# Patient Record
Sex: Male | Born: 1940 | Race: White | Hispanic: No | Marital: Married | Smoking: Former smoker
Health system: Southern US, Community
[De-identification: ages and names within clinical notes are randomized; demographics above are authoritative.]

## PROBLEM LIST (undated history)

## (undated) DIAGNOSIS — M1990 Unspecified osteoarthritis, unspecified site: Secondary | ICD-10-CM

## (undated) DIAGNOSIS — K219 Gastro-esophageal reflux disease without esophagitis: Secondary | ICD-10-CM

## (undated) DIAGNOSIS — I10 Essential (primary) hypertension: Secondary | ICD-10-CM

## (undated) DIAGNOSIS — J449 Chronic obstructive pulmonary disease, unspecified: Secondary | ICD-10-CM

## (undated) DIAGNOSIS — E119 Type 2 diabetes mellitus without complications: Secondary | ICD-10-CM

## (undated) DIAGNOSIS — C801 Malignant (primary) neoplasm, unspecified: Secondary | ICD-10-CM

## (undated) DIAGNOSIS — T884XXA Failed or difficult intubation, initial encounter: Secondary | ICD-10-CM

## (undated) DIAGNOSIS — I712 Thoracic aortic aneurysm, without rupture, unspecified: Secondary | ICD-10-CM

## (undated) DIAGNOSIS — J9819 Other pulmonary collapse: Secondary | ICD-10-CM

## (undated) DIAGNOSIS — N1832 Chronic kidney disease, stage 3b: Secondary | ICD-10-CM

## (undated) DIAGNOSIS — E785 Hyperlipidemia, unspecified: Secondary | ICD-10-CM

## (undated) DIAGNOSIS — K3580 Unspecified acute appendicitis: Secondary | ICD-10-CM

## (undated) DIAGNOSIS — I2510 Atherosclerotic heart disease of native coronary artery without angina pectoris: Secondary | ICD-10-CM

## (undated) DIAGNOSIS — E538 Deficiency of other specified B group vitamins: Secondary | ICD-10-CM

## (undated) DIAGNOSIS — M109 Gout, unspecified: Secondary | ICD-10-CM

## (undated) DIAGNOSIS — N289 Disorder of kidney and ureter, unspecified: Secondary | ICD-10-CM

## (undated) DIAGNOSIS — G4730 Sleep apnea, unspecified: Secondary | ICD-10-CM

## (undated) HISTORY — PX: CARDIAC CATHETERIZATION: SHX172

## (undated) HISTORY — PX: CHEST TUBE INSERTION: SHX231

## (undated) HISTORY — PX: JOINT REPLACEMENT: SHX530

## (undated) HISTORY — DX: Unspecified acute appendicitis: K35.80

## (undated) HISTORY — PX: EYE SURGERY: SHX253

## (undated) HISTORY — PX: APPENDECTOMY: SHX54

---

## 1963-08-21 HISTORY — PX: KNEE SURGERY: SHX244

## 2004-06-30 ENCOUNTER — Observation Stay: Payer: Self-pay | Admitting: Internal Medicine

## 2004-06-30 ENCOUNTER — Other Ambulatory Visit: Payer: Self-pay

## 2005-05-23 ENCOUNTER — Emergency Department: Payer: Self-pay | Admitting: Emergency Medicine

## 2005-05-24 ENCOUNTER — Other Ambulatory Visit: Payer: Self-pay

## 2008-05-20 ENCOUNTER — Ambulatory Visit: Payer: Self-pay | Admitting: Internal Medicine

## 2008-08-01 ENCOUNTER — Ambulatory Visit: Payer: Self-pay | Admitting: Family Medicine

## 2008-12-14 ENCOUNTER — Ambulatory Visit: Payer: Self-pay | Admitting: Urology

## 2009-10-25 ENCOUNTER — Ambulatory Visit: Payer: Self-pay | Admitting: Cardiovascular Disease

## 2009-10-25 ENCOUNTER — Ambulatory Visit: Payer: Self-pay | Admitting: Internal Medicine

## 2010-08-20 HISTORY — PX: CATARACT EXTRACTION: SUR2

## 2010-09-23 ENCOUNTER — Ambulatory Visit: Payer: Self-pay | Admitting: Internal Medicine

## 2014-05-27 ENCOUNTER — Emergency Department: Payer: Self-pay | Admitting: Emergency Medicine

## 2014-05-27 LAB — BASIC METABOLIC PANEL
ANION GAP: 7 (ref 7–16)
BUN: 11 mg/dL (ref 7–18)
CO2: 28 mmol/L (ref 21–32)
CREATININE: 1.02 mg/dL (ref 0.60–1.30)
Calcium, Total: 8.2 mg/dL — ABNORMAL LOW (ref 8.5–10.1)
Chloride: 105 mmol/L (ref 98–107)
EGFR (African American): >60
EGFR (Non-African Amer.): >60
Glucose: 120 mg/dL — ABNORMAL HIGH (ref 65–99)
Osmolality: 280 (ref 275–301)
Potassium: 3.8 mmol/L (ref 3.5–5.1)
Sodium: 140 mmol/L (ref 136–145)

## 2014-05-27 LAB — TROPONIN I: Troponin-I: <0.02 ng/mL

## 2014-05-27 LAB — CBC
HCT: 40.9 % (ref 40.0–52.0)
HGB: 13.3 g/dL (ref 13.0–18.0)
MCH: 30 pg (ref 26.0–34.0)
MCHC: 32.5 g/dL (ref 32.0–36.0)
MCV: 92 fL (ref 80–100)
Platelet: 198 10*3/uL (ref 150–440)
RBC: 4.42 10*6/uL (ref 4.40–5.90)
RDW: 12.9 % (ref 11.5–14.5)
WBC: 6.3 10*3/uL (ref 3.8–10.6)

## 2014-05-28 DIAGNOSIS — Z9889 Other specified postprocedural states: Secondary | ICD-10-CM | POA: Insufficient documentation

## 2014-05-28 DIAGNOSIS — R0602 Shortness of breath: Secondary | ICD-10-CM | POA: Insufficient documentation

## 2014-06-09 ENCOUNTER — Ambulatory Visit: Payer: Self-pay | Admitting: Internal Medicine

## 2014-07-07 ENCOUNTER — Ambulatory Visit: Payer: Self-pay | Admitting: Internal Medicine

## 2014-07-08 DIAGNOSIS — G4733 Obstructive sleep apnea (adult) (pediatric): Secondary | ICD-10-CM | POA: Insufficient documentation

## 2015-02-17 DIAGNOSIS — R21 Rash and other nonspecific skin eruption: Secondary | ICD-10-CM | POA: Insufficient documentation

## 2015-05-11 ENCOUNTER — Other Ambulatory Visit: Payer: Self-pay | Admitting: Internal Medicine

## 2015-05-11 DIAGNOSIS — IMO0002 Reserved for concepts with insufficient information to code with codable children: Secondary | ICD-10-CM

## 2015-05-11 DIAGNOSIS — N5089 Other specified disorders of the male genital organs: Secondary | ICD-10-CM

## 2015-05-12 ENCOUNTER — Ambulatory Visit
Admission: RE | Admit: 2015-05-12 | Discharge: 2015-05-12 | Disposition: A | Discharge status: Home / Self Care | Payer: Medicare Other | Source: Ambulatory Visit | Attending: Internal Medicine | Admitting: Internal Medicine

## 2015-05-12 ENCOUNTER — Ambulatory Visit: Admission: RE | Admit: 2015-05-12 | Payer: Medicare Other | Source: Ambulatory Visit

## 2015-05-12 DIAGNOSIS — N508 Other specified disorders of male genital organs: Secondary | ICD-10-CM | POA: Diagnosis present

## 2015-05-12 DIAGNOSIS — IMO0002 Reserved for concepts with insufficient information to code with codable children: Secondary | ICD-10-CM

## 2015-05-12 DIAGNOSIS — N503 Cyst of epididymis: Secondary | ICD-10-CM | POA: Insufficient documentation

## 2015-05-12 DIAGNOSIS — N5089 Other specified disorders of the male genital organs: Secondary | ICD-10-CM

## 2015-09-29 DIAGNOSIS — R739 Hyperglycemia, unspecified: Secondary | ICD-10-CM | POA: Insufficient documentation

## 2016-03-29 DIAGNOSIS — R972 Elevated prostate specific antigen [PSA]: Secondary | ICD-10-CM | POA: Insufficient documentation

## 2016-04-10 ENCOUNTER — Encounter: Payer: Self-pay | Admitting: Urology

## 2016-04-10 ENCOUNTER — Ambulatory Visit (INDEPENDENT_AMBULATORY_CARE_PROVIDER_SITE_OTHER): Payer: Medicare Other | Admitting: Urology

## 2016-04-10 VITALS — BP 202/109 | HR 62 | Ht 69.0 in | Wt 199.5 lb

## 2016-04-10 DIAGNOSIS — R972 Elevated prostate specific antigen [PSA]: Secondary | ICD-10-CM | POA: Diagnosis not present

## 2016-04-10 NOTE — Progress Notes (Signed)
04/10/2016 5:09 PM   [place] [birth year] [id number]  Referring provider: Tracie Harrier, MD [address] [facility], [place] [address]  Chief Complaint  Patient presents with  . Elevated PSA    HPI: 75 year old male was referred for an elevated PSA. The patient states that this was noted on a routine annual physical exam. The patient states that he has not had any significant progression of his voiding symptoms including frequency, urgency, or incontinence. He has not noted any blood in his urine. However, the patient has been treated approximately 7 or 8 months ago for epididymitis with tender to an sore testicles. The antibiotics seemed to help his pain and symptoms although this is lingered for a period of time. He denies any constipation.   The patient states that he has had PSA screening for many years. He had a elevated PSA 15 years ago which prompted a prostate biopsy. This was negative. He does not recall any specific numbers although he is been told that they've been normal. His never had an abnormal rectal exam. He has no family history of prostate cancer.  The patient has baseline erectile dysfunction.  PSA: 03/22/16-7.39     PMH: No past medical history on file.  Surgical History: Past Surgical History:  Procedure Laterality Date  . CATARACT EXTRACTION  2012  . KNEE SURGERY Left 1965    Home Medications:    Medication List       Accurate as of 04/10/16  5:09 PM. Always use your most recent med list.          allopurinol 100 MG tablet Commonly known as:  ZYLOPRIM Take 100 mg by mouth daily.   ALPRAZolam 0.5 MG tablet Commonly known as:  XANAX Take 0.5 mg by mouth at bedtime as needed for anxiety.   aspirin EC 81 MG tablet Take 81 mg by mouth daily.   cloNIDine 0.3 MG tablet Commonly known as:  CATAPRES Take 0.3 mg by mouth 3 (three) times daily.   hydrALAZINE 50 MG tablet Commonly known as:  APRESOLINE Take  50 mg by mouth 3 (three) times daily.   labetalol 300 MG tablet Commonly known as:  NORMODYNE Take 300 mg by mouth 2 (two) times daily.   meloxicam 15 MG tablet Commonly known as:  MOBIC Take 15 mg by mouth daily.   sildenafil 20 MG tablet Commonly known as:  REVATIO Take 20 mg by mouth 3 (three) times daily.   simvastatin 40 MG tablet Commonly known as:  ZOCOR Take 40 mg by mouth daily.       Allergies: Allergies not on file  Family History: Family History  Problem Relation Age of Onset  . Prostate cancer Neg Hx     Social History:  reports that he has quit smoking. He has never used smokeless tobacco. He reports that he drinks alcohol. He reports that he does not use drugs.  ROS: UROLOGY Frequent Urination?: No Hard to postpone urination?: No Burning/pain with urination?: No Get up at night to urinate?: No Leakage of urine?: No Urine stream starts and stops?: No Trouble starting stream?: Yes Do you have to strain to urinate?: Yes Blood in urine?: Yes Urinary tract infection?: No Sexually transmitted disease?: No Injury to kidneys or bladder?: No Painful intercourse?: No Weak stream?: No Erection problems?: No Penile pain?: No  Gastrointestinal Nausea?: No Vomiting?: No Indigestion/heartburn?: No Diarrhea?: No Constipation?: No  Constitutional Fever: No Night sweats?: No Weight loss?: No Fatigue?:  No  Skin Skin rash/lesions?: No Itching?: No  Eyes Blurred vision?: No Double vision?: No  Ears/Nose/Throat Sore throat?: No Sinus problems?: Yes  Hematologic/Lymphatic Swollen glands?: No Easy bruising?: No  Cardiovascular Leg swelling?: No Chest pain?: No  Respiratory Cough?: No Shortness of breath?: No  Endocrine Excessive thirst?: No  Musculoskeletal Back pain?: No Joint pain?: Yes  Neurological Headaches?: No Dizziness?: No  Psychologic Depression?: No Anxiety?: Yes  Physical Exam: BP (!) 202/109   Pulse 62   Ht  5\' 9"  (1.753 m)   Wt 199 lb 8 oz (90.5 kg)   BMI 29.46 kg/m   Constitutional:  Alert and oriented, No acute distress. HEENT: [place] AT, moist mucus membranes.  Trachea midline, no masses. Cardiovascular: No clubbing, cyanosis, or edema. Respiratory: Normal respiratory effort, no increased work of breathing. GI: Abdomen is soft, nontender, nondistended, no abdominal masses GU: No CVA tenderness.  DRE: 40 g prostate, smooth, symmetric, no appreciable nodules Skin: No rashes, bruises or suspicious lesions. Lymph: No cervical or inguinal adenopathy. Neurologic: Grossly intact, no focal deficits, moving all 4 extremities. Psychiatric: Normal mood and affect.  Laboratory Data: Lab Results  Component Value Date   WBC 6.3 05/27/2014   HGB 13.3 05/27/2014   HCT 40.9 05/27/2014   MCV 92 05/27/2014   PLT 198 05/27/2014    Lab Results  Component Value Date   CREATININE 1.02 05/27/2014    No results found for: PSA  No results found for: TESTOSTERONE  No results found for: HGBA1C  Urinalysis No results found for: COLORURINE, APPEARANCEUR, LABSPEC, PHURINE, GLUCOSEU, HGBUR, BILIRUBINUR, KETONESUR, PROTEINUR, UROBILINOGEN, NITRITE, LEUKOCYTESUR  Pertinent Imaging: None   Assessment & Plan:  The patient has an elevated PSA. I don't have any PSAs to compare to. I suspect however that this is an area and value and may reflect a similar process of inflammation to the patient's testicular pain several months prior.  1. Elevated PSA Our plan at this point is to check the PSA today and then again in 3 months. We will reevaluate at that time and discuss whether or not a prostate biopsy would be a reasonable approach.  - PSA Total (Reflex To Free)   Return in about 3 months (around 07/11/2016) for office visit with PSA prior.  Ardis Hughs, [facility] [address], [place], [place] [address] [phone]

## 2016-04-11 LAB — FPSA% REFLEX
% FREE PSA: 31.8 %
PSA, FREE: 2.61 ng/mL

## 2016-04-11 LAB — PSA TOTAL (REFLEX TO FREE): PROSTATE SPECIFIC AG, SERUM: 8.2 ng/mL — AB (ref 0.0–4.0)

## 2016-04-13 ENCOUNTER — Telehealth: Payer: Self-pay

## 2016-04-13 NOTE — Telephone Encounter (Signed)
-----   Message from Ardis Hughs, MD sent at 04/12/2016  5:31 PM EDT ----- Please inform the patient that his PSA is slightly elevated from the previous value.  We will repeat this again in 3 months.

## 2016-04-13 NOTE — Telephone Encounter (Signed)
Spoke with wife in reference to PSA levels. Wife voiced understanding. Pt already has appts on file in 78mo.

## 2016-06-02 ENCOUNTER — Encounter: Payer: Self-pay | Admitting: Urgent Care

## 2016-06-02 ENCOUNTER — Inpatient Hospital Stay
Admission: EM | Admit: 2016-06-02 | Discharge: 2016-06-05 | DRG: 201 | Disposition: A | Discharge status: Home / Self Care | Payer: Medicare Other | Attending: Surgery | Admitting: Surgery

## 2016-06-02 ENCOUNTER — Emergency Department: Payer: Medicare Other

## 2016-06-02 DIAGNOSIS — Z09 Encounter for follow-up examination after completed treatment for conditions other than malignant neoplasm: Secondary | ICD-10-CM

## 2016-06-02 DIAGNOSIS — J9311 Primary spontaneous pneumothorax: Secondary | ICD-10-CM | POA: Diagnosis not present

## 2016-06-02 DIAGNOSIS — M109 Gout, unspecified: Secondary | ICD-10-CM | POA: Diagnosis present

## 2016-06-02 DIAGNOSIS — I10 Essential (primary) hypertension: Secondary | ICD-10-CM | POA: Diagnosis present

## 2016-06-02 DIAGNOSIS — Z23 Encounter for immunization: Secondary | ICD-10-CM

## 2016-06-02 DIAGNOSIS — R0602 Shortness of breath: Secondary | ICD-10-CM | POA: Diagnosis not present

## 2016-06-02 DIAGNOSIS — I2510 Atherosclerotic heart disease of native coronary artery without angina pectoris: Secondary | ICD-10-CM | POA: Diagnosis present

## 2016-06-02 DIAGNOSIS — Z791 Long term (current) use of non-steroidal anti-inflammatories (NSAID): Secondary | ICD-10-CM

## 2016-06-02 DIAGNOSIS — E785 Hyperlipidemia, unspecified: Secondary | ICD-10-CM | POA: Diagnosis present

## 2016-06-02 DIAGNOSIS — Z79899 Other long term (current) drug therapy: Secondary | ICD-10-CM

## 2016-06-02 DIAGNOSIS — Z7982 Long term (current) use of aspirin: Secondary | ICD-10-CM

## 2016-06-02 DIAGNOSIS — Z87891 Personal history of nicotine dependence: Secondary | ICD-10-CM

## 2016-06-02 DIAGNOSIS — G4730 Sleep apnea, unspecified: Secondary | ICD-10-CM | POA: Diagnosis present

## 2016-06-02 DIAGNOSIS — J939 Pneumothorax, unspecified: Secondary | ICD-10-CM | POA: Diagnosis present

## 2016-06-02 HISTORY — DX: Sleep apnea, unspecified: G47.30

## 2016-06-02 HISTORY — DX: Essential (primary) hypertension: I10

## 2016-06-02 HISTORY — DX: Atherosclerotic heart disease of native coronary artery without angina pectoris: I25.10

## 2016-06-02 HISTORY — DX: Hyperlipidemia, unspecified: E78.5

## 2016-06-02 HISTORY — DX: Gout, unspecified: M10.9

## 2016-06-02 LAB — BASIC METABOLIC PANEL
ANION GAP: 11 (ref 5–15)
BUN: 34 mg/dL — ABNORMAL HIGH (ref 6–20)
CHLORIDE: 101 mmol/L (ref 101–111)
CO2: 24 mmol/L (ref 22–32)
Calcium: 9.8 mg/dL (ref 8.9–10.3)
Creatinine, Ser: 1.51 mg/dL — ABNORMAL HIGH (ref 0.61–1.24)
GFR, EST AFRICAN AMERICAN: 50 mL/min — AB (ref >60)
GFR, EST NON AFRICAN AMERICAN: 43 mL/min — AB (ref >60)
Glucose, Bld: 124 mg/dL — ABNORMAL HIGH (ref 65–99)
POTASSIUM: 3.8 mmol/L (ref 3.5–5.1)
SODIUM: 136 mmol/L (ref 135–145)

## 2016-06-02 LAB — CBC
HEMATOCRIT: 44.9 % (ref 40.0–52.0)
Hemoglobin: 15.5 g/dL (ref 13.0–18.0)
MCH: 31.7 pg (ref 26.0–34.0)
MCHC: 34.5 g/dL (ref 32.0–36.0)
MCV: 91.8 fL (ref 80.0–100.0)
PLATELETS: 219 10*3/uL (ref 150–440)
RBC: 4.89 MIL/uL (ref 4.40–5.90)
RDW: 13.1 % (ref 11.5–14.5)
WBC: 6.6 10*3/uL (ref 3.8–10.6)

## 2016-06-02 LAB — TROPONIN I: Troponin I: <0.03 ng/mL (ref <0.03)

## 2016-06-02 NOTE — ED Triage Notes (Signed)
Patient presents with c/o retrosternal CP; reports that th pain is through and through to his back. Patient reports that the symptoms started this morning. Patient advises that he was walking the dog and became exertionally dyspneic. Wife has COPD - patient used her pulse oximeter - sats noted to be in the low 70s. He reports that he sat down and the numbers returned to baseline in the 90s. Patient reports that the DOE has been persistent all day; "I can walk 30 or 40 feet and get into distress today". (+) nausea reported.

## 2016-06-03 ENCOUNTER — Inpatient Hospital Stay: Payer: Medicare Other

## 2016-06-03 ENCOUNTER — Encounter: Payer: Self-pay | Admitting: Surgery

## 2016-06-03 DIAGNOSIS — J939 Pneumothorax, unspecified: Secondary | ICD-10-CM | POA: Diagnosis not present

## 2016-06-03 DIAGNOSIS — I10 Essential (primary) hypertension: Secondary | ICD-10-CM | POA: Diagnosis present

## 2016-06-03 DIAGNOSIS — Z87891 Personal history of nicotine dependence: Secondary | ICD-10-CM | POA: Diagnosis not present

## 2016-06-03 DIAGNOSIS — Z79899 Other long term (current) drug therapy: Secondary | ICD-10-CM | POA: Diagnosis not present

## 2016-06-03 DIAGNOSIS — G4730 Sleep apnea, unspecified: Secondary | ICD-10-CM | POA: Diagnosis present

## 2016-06-03 DIAGNOSIS — J9311 Primary spontaneous pneumothorax: Secondary | ICD-10-CM | POA: Diagnosis present

## 2016-06-03 DIAGNOSIS — M109 Gout, unspecified: Secondary | ICD-10-CM | POA: Diagnosis present

## 2016-06-03 DIAGNOSIS — Z23 Encounter for immunization: Secondary | ICD-10-CM | POA: Diagnosis not present

## 2016-06-03 DIAGNOSIS — I2510 Atherosclerotic heart disease of native coronary artery without angina pectoris: Secondary | ICD-10-CM | POA: Diagnosis present

## 2016-06-03 DIAGNOSIS — R0602 Shortness of breath: Secondary | ICD-10-CM | POA: Diagnosis present

## 2016-06-03 DIAGNOSIS — Z7982 Long term (current) use of aspirin: Secondary | ICD-10-CM | POA: Diagnosis not present

## 2016-06-03 DIAGNOSIS — Z791 Long term (current) use of non-steroidal anti-inflammatories (NSAID): Secondary | ICD-10-CM | POA: Diagnosis not present

## 2016-06-03 DIAGNOSIS — E785 Hyperlipidemia, unspecified: Secondary | ICD-10-CM | POA: Diagnosis present

## 2016-06-03 LAB — BASIC METABOLIC PANEL
ANION GAP: 10 (ref 5–15)
BUN: 31 mg/dL — AB (ref 6–20)
CHLORIDE: 103 mmol/L (ref 101–111)
CO2: 26 mmol/L (ref 22–32)
Calcium: 9.6 mg/dL (ref 8.9–10.3)
Creatinine, Ser: 1.37 mg/dL — ABNORMAL HIGH (ref 0.61–1.24)
GFR calc Af Amer: 57 mL/min — ABNORMAL LOW (ref >60)
GFR, EST NON AFRICAN AMERICAN: 49 mL/min — AB (ref >60)
GLUCOSE: 113 mg/dL — AB (ref 65–99)
POTASSIUM: 4 mmol/L (ref 3.5–5.1)
Sodium: 139 mmol/L (ref 135–145)

## 2016-06-03 LAB — FIBRIN DERIVATIVES D-DIMER (ARMC ONLY): FIBRIN DERIVATIVES D-DIMER (ARMC): 501 — AB (ref 0–499)

## 2016-06-03 LAB — BRAIN NATRIURETIC PEPTIDE: B Natriuretic Peptide: 47 pg/mL (ref 0.0–100.0)

## 2016-06-03 MED ORDER — LIDOCAINE HCL (PF) 1 % IJ SOLN
INTRAMUSCULAR | Status: AC
  Filled 2016-06-03: qty 20

## 2016-06-03 MED ORDER — ONDANSETRON 4 MG PO TBDP: 4.0000 mg | ORAL_TABLET | Freq: Four times a day (QID) | ORAL | Status: DC | PRN

## 2016-06-03 MED ORDER — ASPIRIN EC 81 MG PO TBEC
81.0000 mg | DELAYED_RELEASE_TABLET | Freq: Every day | ORAL | Status: DC
  Administered 2016-06-03 – 2016-06-05 (×3): 81 mg via ORAL
  Filled 2016-06-03 (×3): qty 1

## 2016-06-03 MED ORDER — ACETAMINOPHEN 500 MG PO TABS
1000.0000 mg | ORAL_TABLET | Freq: Four times a day (QID) | ORAL | Status: DC
  Administered 2016-06-03 – 2016-06-05 (×7): 1000 mg via ORAL
  Filled 2016-06-03 (×7): qty 2

## 2016-06-03 MED ORDER — HEPARIN SODIUM (PORCINE) 5000 UNIT/ML IJ SOLN
5000.0000 [IU] | Freq: Three times a day (TID) | INTRAMUSCULAR | Status: DC
  Administered 2016-06-03 – 2016-06-05 (×6): 5000 [IU] via SUBCUTANEOUS
  Filled 2016-06-03 (×6): qty 1

## 2016-06-03 MED ORDER — POLYETHYLENE GLYCOL 3350 17 G PO PACK
17.0000 g | PACK | Freq: Every day | ORAL | Status: DC | PRN
  Administered 2016-06-03: 17 g via ORAL
  Filled 2016-06-03 (×2): qty 1

## 2016-06-03 MED ORDER — HYDROMORPHONE HCL 1 MG/ML IJ SOLN: 0.5000 mg | INTRAMUSCULAR | Status: DC | PRN

## 2016-06-03 MED ORDER — SODIUM CHLORIDE 0.9 % IV SOLN
INTRAVENOUS | Status: DC
  Administered 2016-06-03 (×2): via INTRAVENOUS

## 2016-06-03 MED ORDER — SIMVASTATIN 40 MG PO TABS
40.0000 mg | ORAL_TABLET | Freq: Every day | ORAL | Status: DC
  Administered 2016-06-04 (×2): 40 mg via ORAL
  Filled 2016-06-03 (×2): qty 1

## 2016-06-03 MED ORDER — HYDROCHLOROTHIAZIDE 25 MG PO TABS
25.0000 mg | ORAL_TABLET | Freq: Every day | ORAL | Status: DC
  Administered 2016-06-03 – 2016-06-05 (×3): 25 mg via ORAL
  Filled 2016-06-03 (×3): qty 1

## 2016-06-03 MED ORDER — MELOXICAM 7.5 MG PO TABS
15.0000 mg | ORAL_TABLET | Freq: Every day | ORAL | Status: DC
  Administered 2016-06-03 – 2016-06-05 (×3): 15 mg via ORAL
  Filled 2016-06-03 (×3): qty 2

## 2016-06-03 MED ORDER — INFLUENZA VAC SPLIT QUAD 0.5 ML IM SUSY
0.5000 mL | PREFILLED_SYRINGE | INTRAMUSCULAR | Status: AC
  Administered 2016-06-05: 0.5 mL via INTRAMUSCULAR
  Filled 2016-06-03: qty 0.5

## 2016-06-03 MED ORDER — SERTRALINE HCL 50 MG PO TABS
25.0000 mg | ORAL_TABLET | Freq: Every day | ORAL | Status: DC
  Administered 2016-06-03 – 2016-06-05 (×3): 25 mg via ORAL
  Filled 2016-06-03 (×3): qty 1

## 2016-06-03 MED ORDER — HYDRALAZINE HCL 50 MG PO TABS
50.0000 mg | ORAL_TABLET | Freq: Three times a day (TID) | ORAL | Status: DC
  Administered 2016-06-03 – 2016-06-05 (×7): 50 mg via ORAL
  Filled 2016-06-03 (×7): qty 1

## 2016-06-03 MED ORDER — SILDENAFIL CITRATE 20 MG PO TABS: 100.0000 mg | ORAL_TABLET | Freq: Every day | ORAL | Status: DC | PRN

## 2016-06-03 MED ORDER — ONDANSETRON HCL 4 MG/2ML IJ SOLN
4.0000 mg | Freq: Four times a day (QID) | INTRAMUSCULAR | Status: DC | PRN
Start: 2016-06-03 — End: 2016-06-05

## 2016-06-03 MED ORDER — ALLOPURINOL 100 MG PO TABS
100.0000 mg | ORAL_TABLET | Freq: Every day | ORAL | Status: DC
  Administered 2016-06-03 – 2016-06-05 (×3): 100 mg via ORAL
  Filled 2016-06-03 (×3): qty 1

## 2016-06-03 MED ORDER — CLONIDINE HCL 0.1 MG PO TABS
0.3000 mg | ORAL_TABLET | Freq: Three times a day (TID) | ORAL | Status: DC
  Administered 2016-06-03 – 2016-06-05 (×7): 0.3 mg via ORAL
  Filled 2016-06-03 (×7): qty 3

## 2016-06-03 MED ORDER — LABETALOL HCL 200 MG PO TABS
300.0000 mg | ORAL_TABLET | Freq: Two times a day (BID) | ORAL | Status: DC
  Administered 2016-06-03 – 2016-06-05 (×5): 300 mg via ORAL
  Filled 2016-06-03 (×3): qty 2
  Filled 2016-06-03 (×2): qty 1
  Filled 2016-06-03: qty 2

## 2016-06-03 MED ORDER — ALPRAZOLAM 0.5 MG PO TABS
0.5000 mg | ORAL_TABLET | Freq: Every evening | ORAL | Status: DC | PRN
  Administered 2016-06-03: 0.5 mg via ORAL
  Filled 2016-06-03: qty 1

## 2016-06-03 MED ORDER — LISINOPRIL 20 MG PO TABS
40.0000 mg | ORAL_TABLET | Freq: Every day | ORAL | Status: DC
  Administered 2016-06-03 – 2016-06-05 (×3): 40 mg via ORAL
  Filled 2016-06-03 (×3): qty 2

## 2016-06-03 MED ORDER — OXYCODONE HCL 5 MG PO TABS: 5.0000 mg | ORAL_TABLET | ORAL | Status: DC | PRN

## 2016-06-03 NOTE — Op Note (Signed)
Date of Procedure:  06/03/2016  Pre-operative Diagnosis:  Right pneumothorax  Post-operative Diagnosis:  Right pneumothorax  Procedure:  Right thoracostomy tube placement  Surgeon:  Olean Ree, MD  Anesthesia:  5 ml 1% lidocaine  Specimens:  None  Complications:  None  Indications for Procedure:  This is a 75 year-old male who presents with a spontaneous right pneumothorax requiring a chest tube placement.  The risk and benefits of the procedure have been explained to the patient and he is willing to proceed.  Description of Procedure:  The patient was correctly identified at bedside and appropriate procedure time-outs were obtained.  The right chest was prepped and draped in usual sterile fashion.  The right 5th intercostal space was identified and 5 ml of 1% lidocaine was infused subcutaneously down to the pleura.  Via Seldinger technique, a 14 Fr. Pigtail catheter chest tube was inserted without complications.  The tube was secured to the skin using 3-0 silk suture.  The tube was connected to the pleura-vac and placed to suction with appropriate bubbling of the water column initially.  The patient reported feeling better after the procedure.  A dry clean dressing was applied over the insertion site and secured with tape.  All sharps were appropriately disposed of and a chest x-ray was ordered.  The patient tolerated the procedure well with no gross complications.   Melvyn Neth, MD

## 2016-06-03 NOTE — ED Notes (Signed)
Pt alert and oriented at start of procedure.  Dr. Hampton Abbot performing procedure of placing R sided chest tube.  Consent signed by patient prior to procedure.  This RN present with MD.

## 2016-06-03 NOTE — Progress Notes (Signed)
Order given to use home cpap

## 2016-06-03 NOTE — Progress Notes (Signed)
CC: Pneumothorax Subjective: Patient admitted last night for a spontaneous pneumothorax. Chest tube placed by my partner in the emergency department. Patient without active complaints currently. States he's being will take care of. He is breathing better.  Objective: Vital signs in last 24 hours: Temp:  [98.1 F (36.7 C)-98.5 F (36.9 C)] 98.1 F (36.7 C) (10/15 0305) Pulse Rate:  [69-84] 80 (10/15 0305) Resp:  [14-20] 18 (10/15 0305) BP: (150-184)/(83-118) 184/89 (10/15 0305) SpO2:  [88 %-99 %] 99 % (10/15 0305) Weight:  [86.2 kg (190 lb)] 86.2 kg (190 lb) (10/14 2305) Last BM Date: 06/03/16  Intake/Output from previous day: No intake/output data recorded. Intake/Output this shift: No intake/output data recorded.  Physical exam:  Gen.: No acute distress Chest: Clear to auscultation bilaterally, chest tube present in the right chest without a visualized air leak. Heart: Regular rhythm Abdomen: Soft and nontender  Lab Results: CBC   Recent Labs  06/02/16 2318  WBC 6.6  HGB 15.5  HCT 44.9  PLT 219   BMET  Recent Labs  06/02/16 2318 06/03/16 0437  NA 136 139  K 3.8 4.0  CL 101 103  CO2 24 26  GLUCOSE 124* 113*  BUN 34* 31*  CREATININE 1.51* 1.37*  CALCIUM 9.8 9.6   PT/INR No results for input(s): LABPROT, INR in the last 72 hours. ABG  Recent Labs  06/02/16 2352  HCO3 31.8*    Studies/Results: Dg Chest 1 View  Result Date: 06/03/2016 CLINICAL DATA:  75 y/o  M; right-sided pneumothorax. EXAM: CHEST 1 VIEW COMPARISON:  06/02/2016 chest radiograph. FINDINGS: Stable cardiomediastinal silhouette. Aortic arch calcifications. Interval placement of a right pigtail catheter into the floor of with near complete resolution of right-sided pneumothorax, trace residual along the right heart border. Bibasilar platelike atelectasis. No focal consolidation. IMPRESSION: Near complete resolution of right-sided pneumothorax after catheter placement. Electronically Signed    By: Kristine Garbe M.D.   On: 06/03/2016 02:51   Dg Chest 2 View  Result Date: 06/03/2016 CLINICAL DATA:  Retrosternal chest pain. EXAM: CHEST  2 VIEW COMPARISON:  Frontal and lateral views 05/27/2014 FINDINGS: Moderate-sized right pneumothorax of approximately 30-40%. Associated right basilar atelectasis. No definite mediastinal shift. Mild atherosclerosis of the thoracic aorta. No focal abnormality in the left lung. No pleural fluid. Degenerative change in the spine. No acute osseous abnormality. IMPRESSION: Moderate sized right pneumothorax without mediastinal shift. Critical Value/emergent results were called by telephone at the time of interpretation on 06/03/2016 at 12:23 am to Dr. Charlesetta Ivory , who verbally acknowledged these results. Electronically Signed   By: Jeb Levering M.D.   On: 06/03/2016 00:24    Anti-infectives: Anti-infectives    None      Assessment/Plan:  75 year old male admitted with a spontaneous pneumothorax. It has been less than 12 hours since the tube was placed so discussed with the patient continuing the tube to suction for today. He voiced understanding. Encourage incentive spirometer.  Susie Pousson T. Adonis Huguenin, MD, FACS  06/03/2016

## 2016-06-03 NOTE — ED Notes (Signed)
Procedure completed at this time.  Pt remains alert and oriented.  Pt denies pain at this time.  14 fr chest tube placed to patient's R side.  Pt tolerated procedure well.  Oasis set up by this RN and suction set to MD's specifications of between 40-60 mm Hg.  Dr. Hampton Abbot placed order for CXR to verify patient.  Pt has no requests or concerns at this time.  Call light with patient and family at bedside.

## 2016-06-03 NOTE — Care Management Important Message (Signed)
Important Message  Patient Details  Name: Tony Mitchell MRN: [id number] Date of Birth: 03-Aug-1941   Medicare Important Message Given:  Yes    Inri Sobieski A, RN 06/03/2016, 2:55 PM

## 2016-06-03 NOTE — ED Provider Notes (Signed)
[facility] Emergency Department Provider Note   ____________________________________________   First MD Initiated Contact with Patient 06/02/16 2335     (approximate)  I have reviewed the triage vital signs and the nursing notes.   HISTORY  Chief Complaint Chest Pain and Shortness of Breath    HPI Tony Mitchell is a 75 y.o. male who comes into the hospital today with some shortness of breath. He reports that he got up this morning and started his normal routine. He noticed that his breathing was a little bit labored and shallow. He did not think much of it. He started trying to take deeper breaths and had some pain in his chest that went through to his back. He reports that his breathing was more labored with exertion. His wife reports that she had to place her O2 on him a few times today. He tried to walk the dog which she normally does twice a day and reports that he was unable to do so. He checked his pulse ox at one point today and it was 78%. He reports he couldn't walk more than 20 or 30 feet without getting very short of breath. The patient reports that he's never had this problem before. He reports he only has pain when he takes a deep breath and when he sitting or standing he feels okay. He's had a mild runny nose and a dry cough. He took his temperature today and it was 96. The patient denies any sweats, nausea, vomiting. He reports that he's felt strange and if he had to describe it he would say it was lightheadedness. He is here for evaluation.   Past Medical History:  Diagnosis Date  . Hyperlipidemia   . Hypertension     There are no active problems to display for this patient.   Past Surgical History:  Procedure Laterality Date  . CATARACT EXTRACTION  2012  . KNEE SURGERY Left 1965    Prior to Admission medications   Medication Sig Start Date End Date Taking? Authorizing Provider  allopurinol (ZYLOPRIM) 100 MG tablet Take 100 mg by  mouth daily.    Historical Provider, MD  ALPRAZolam Duanne Moron) 0.5 MG tablet Take 0.5 mg by mouth at bedtime as needed for anxiety.    Historical Provider, MD  aspirin EC 81 MG tablet Take 81 mg by mouth daily.    Historical Provider, MD  cloNIDine (CATAPRES) 0.3 MG tablet Take 0.3 mg by mouth 3 (three) times daily.    Historical Provider, MD  hydrALAZINE (APRESOLINE) 50 MG tablet Take 50 mg by mouth 3 (three) times daily.    Historical Provider, MD  hydrochlorothiazide (HYDRODIURIL) 25 MG tablet Take 25 mg by mouth daily. 05/14/16   Historical Provider, MD  labetalol (NORMODYNE) 300 MG tablet Take 300 mg by mouth 2 (two) times daily.    Historical Provider, MD  lisinopril (PRINIVIL,ZESTRIL) 40 MG tablet Take 40 mg by mouth daily. 05/21/16   Historical Provider, MD  meloxicam (MOBIC) 15 MG tablet Take 15 mg by mouth daily.    Historical Provider, MD  sildenafil (REVATIO) 20 MG tablet Take 20 mg by mouth 3 (three) times daily.    Historical Provider, MD  simvastatin (ZOCOR) 40 MG tablet Take 40 mg by mouth daily.    Historical Provider, MD  traZODone (DESYREL) 50 MG tablet Take 50 mg by mouth at bedtime. 05/11/16   Historical Provider, MD    Allergies Review of patient's allergies indicates no known allergies.  Family History  Problem Relation Age of Onset  . Prostate cancer Neg Hx     Social History Social History  Substance Use Topics  . Smoking status: Former Research scientist (life sciences)  . Smokeless tobacco: Never Used  . Alcohol use Yes    Review of Systems Constitutional: No fever/chills Eyes: No visual changes. ENT: No sore throat. Cardiovascular:  chest pain. Respiratory:  shortness of breath. Gastrointestinal: No abdominal pain.  No nausea, no vomiting.  No diarrhea.  No constipation. Genitourinary: Negative for dysuria. Musculoskeletal: Negative for back pain. Skin: Negative for rash. Neurological: Lightheadedness  10-point ROS otherwise  negative.  ____________________________________________   PHYSICAL EXAM:  VITAL SIGNS: ED Triage Vitals  Enc Vitals Group     BP 06/02/16 2305 (!) 175/94     Pulse Rate 06/02/16 2305 82     Resp 06/02/16 2305 19     Temp 06/02/16 2305 98.5 F (36.9 C)     Temp Source 06/02/16 2305 Oral     SpO2 06/02/16 2305 94 %     Weight 06/02/16 2305 190 lb (86.2 kg)     Height 06/02/16 2305 5\' 9"  (1.753 m)     Head Circumference --      Peak Flow --      Pain Score 06/02/16 2311 2     Pain Loc --      Pain Edu? --      Excl. in [place]? --     Constitutional: Alert and oriented. Well appearing and in Mild distress. Eyes: Conjunctivae are normal. PERRL. EOMI. Head: Atraumatic. Nose: No congestion/rhinnorhea. Mouth/Throat: Mucous membranes are moist.  Oropharynx non-erythematous. Cardiovascular: Normal rate, regular rhythm. Grossly normal heart sounds.  Good peripheral circulation. Respiratory: Normal respiratory effort.  No retractions. Lungs CTAB. Gastrointestinal: Soft and nontender. No distention. Positive bowel sounds Musculoskeletal: No lower extremity tenderness nor edema.   Neurologic:  Normal speech and language.  Skin:  Skin is warm, dry and intact.  Psychiatric: Mood and affect are normal.   ____________________________________________   LABS (all labs ordered are listed, but only abnormal results are displayed)  Labs Reviewed  BASIC METABOLIC PANEL - Abnormal; Notable for the following:       Result Value   Glucose, Bld 124 (*)    BUN 34 (*)    Creatinine, Ser 1.51 (*)    GFR calc non Af Amer 43 (*)    GFR calc Af Amer 50 (*)    All other components within normal limits  FIBRIN DERIVATIVES D-DIMER ([facility] ONLY) - Abnormal; Notable for the following:    Fibrin derivatives D-dimer ([facility]) 501 (*)    All other components within normal limits  BLOOD GAS, VENOUS - Abnormal; Notable for the following:    Bicarbonate 31.8 (*)    Acid-Base Excess 4.7 (*)    All other  components within normal limits  CBC  TROPONIN I  BRAIN NATRIURETIC PEPTIDE   ____________________________________________  EKG  ED ECG REPORT I, Loney Hering, the attending physician, personally viewed and interpreted this ECG.   Date: 06/03/2016  EKG Time: 2303  Rate: 85  Rhythm: normal sinus rhythm  Axis: normal  Intervals:none  ST&T Change: none  ____________________________________________  RADIOLOGY  CXR ____________________________________________   PROCEDURES  Procedure(s) performed: None  Procedures  Critical Care performed: No  ____________________________________________   INITIAL IMPRESSION / ASSESSMENT AND PLAN / ED COURSE  Pertinent labs & imaging results that were available during my care of the patient were reviewed by me  and considered in my medical decision making (see chart for details).  This is a 75 year old male who comes into the hospital today with some chest pain and shortness of breath with deep inspiration. The patient reports that he is unsure what was causing the symptoms. The patient does have a history of hypertension with labile blood pressures. He is here tonight for evaluation. The patient's blood work shows some mild renal insufficiency but otherwise is unremarkable.  Clinical Course  Value Comment By Time  DG Chest 2 View Moderate sized right pneumothorax without mediastinal shift.    Loney Hering, MD 10/15 [phone]   The patient's chest x-ray shows a pneumothorax which is the cause of the patient's symptoms. I did contact the surgeon Dr.Piscoya who will come to the emergency department and placed the patient's chest tube. The patient will be then admitted to the surgical service for management of his chest tube and his pneumothorax. The patient has no further questions or concerns and he also has no other complaints. He is comfortable at this time.  ____________________________________________   FINAL CLINICAL  IMPRESSION(S) / ED DIAGNOSES  Final diagnoses:  Primary spontaneous pneumothorax  Shortness of breath      NEW MEDICATIONS STARTED DURING THIS VISIT:  New Prescriptions   No medications on file     Note:  This document was prepared using Dragon voice recognition software and may include unintentional dictation errors.    Loney Hering, MD 06/03/16 [phone]

## 2016-06-03 NOTE — H&P (Signed)
Date of Admission:  06/03/2016  Reason for Admission:  Spontaneous right pneumothorax  History of Present Illness: Tony Mitchell is a 75 y.o. male who presents with acute onset of right sided pneumothorax.  Patient reports this morning he woke up with right sided chest pain and had dyspnea on exertion when walking his dog this morning.  He checked his O2 sat using his wife's pulse oximeter and it was in the 70s.  Upon rest, it went back up to 90s.  However, he continued having right sided chest pain and some shortness of breath and presented to ED for further evaluation.  CXR showed a moderate sized right pneumothorax.  Patient denies any trauma or recent injury.  Describes the pain as sharp on the right, non-radiating.   Past Medical History: Past Medical History:  Diagnosis Date  . Coronary artery disease   . Gout   . Hyperlipidemia   . Hypertension   . Sleep apnea      Past Surgical History: Past Surgical History:  Procedure Laterality Date  . CATARACT EXTRACTION  2012  . KNEE SURGERY Left 1965    Home Medications: Prior to Admission medications   Medication Sig Start Date End Date Taking? Authorizing Provider  allopurinol (ZYLOPRIM) 100 MG tablet Take 100 mg by mouth daily.    Historical Provider, MD  ALPRAZolam Duanne Moron) 0.5 MG tablet Take 0.5 mg by mouth at bedtime as needed for anxiety.    Historical Provider, MD  aspirin EC 81 MG tablet Take 81 mg by mouth daily.    Historical Provider, MD  cloNIDine (CATAPRES) 0.3 MG tablet Take 0.3 mg by mouth 3 (three) times daily.    Historical Provider, MD  hydrALAZINE (APRESOLINE) 50 MG tablet Take 50 mg by mouth 3 (three) times daily.    Historical Provider, MD  hydrochlorothiazide (HYDRODIURIL) 25 MG tablet Take 25 mg by mouth daily. 05/14/16   Historical Provider, MD  labetalol (NORMODYNE) 300 MG tablet Take 300 mg by mouth 2 (two) times daily.    Historical Provider, MD  lisinopril (PRINIVIL,ZESTRIL) 40 MG tablet Take 40 mg by  mouth daily. 05/21/16   Historical Provider, MD  meloxicam (MOBIC) 15 MG tablet Take 15 mg by mouth daily.    Historical Provider, MD  sildenafil (REVATIO) 20 MG tablet Take 20 mg by mouth 3 (three) times daily.    Historical Provider, MD  simvastatin (ZOCOR) 40 MG tablet Take 40 mg by mouth daily.    Historical Provider, MD  traZODone (DESYREL) 50 MG tablet Take 50 mg by mouth at bedtime. 05/11/16   Historical Provider, MD    Allergies: No Known Allergies  Social History:  reports that he has quit smoking. He has never used smokeless tobacco. He reports that he drinks alcohol. He reports that he does not use drugs.   Family History: Family History  Problem Relation Age of Onset  . Prostate cancer Neg Hx     Review of Systems: Review of Systems  Constitutional: Negative for chills and fever.  Eyes: Negative for blurred vision.  Respiratory: Positive for shortness of breath. Negative for cough, hemoptysis and wheezing.   Cardiovascular: Positive for chest pain. Negative for palpitations and leg swelling.  Gastrointestinal: Negative for abdominal pain, constipation, diarrhea, heartburn, nausea and vomiting.  Genitourinary: Negative for dysuria and hematuria.  Musculoskeletal: Negative for myalgias.  Skin: Negative for rash.  Neurological: Negative for dizziness and headaches.  Psychiatric/Behavioral: Negative for depression.    Physical Exam BP (!) 153/118  Pulse 69   Temp 98.5 F (36.9 C) (Oral)   Resp 14   Ht 5\' 9"  (1.753 m)   Wt 86.2 kg (190 lb)   SpO2 97%   BMI 28.06 kg/m  CONSTITUTIONAL: No acute distress HEENT:  Normocephalic, atraumatic, extraocular motion intact. NECK: Trachea is midline, and there is no jugular venous distension.  LYMPH NODES:  Lymph nodes in the neck are not enlarged. RESPIRATORY:  Lungs are clear, with decreased breath sounds on the right.  Normal respiratory effort without pathologic use of accessory muscles. CARDIOVASCULAR: Heart is regular  without murmurs, gallops, or rubs. GI: The abdomen is soft, nondistended, nontender. Patient has diastasis recti. MUSCULOSKELETAL:  Normal muscle strength and tone in all four extremities.  No peripheral edema or cyanosis. SKIN: Skin turgor is normal. There are no pathologic skin lesions.  NEUROLOGIC:  Motor and sensation is grossly normal.  Cranial nerves are grossly intact. PSYCH:  Alert and oriented to person, place and time. Affect is normal.  Laboratory Analysis: Results for orders placed or performed during the hospital encounter of 06/02/16 (from the past 24 hour(s))  Basic metabolic panel     Status: Abnormal   Collection Time: 06/02/16 11:18 PM  Result Value Ref Range   Sodium 136 135 - 145 mmol/L   Potassium 3.8 3.5 - 5.1 mmol/L   Chloride 101 101 - 111 mmol/L   CO2 24 22 - 32 mmol/L   Glucose, Bld 124 (H) 65 - 99 mg/dL   BUN 34 (H) 6 - 20 mg/dL   Creatinine, Ser 1.51 (H) 0.61 - 1.24 mg/dL   Calcium 9.8 8.9 - 10.3 mg/dL   GFR calc non Af Amer 43 (L) >60 mL/min   GFR calc Af Amer 50 (L) >60 mL/min   Anion gap 11 5 - 15  CBC     Status: None   Collection Time: 06/02/16 11:18 PM  Result Value Ref Range   WBC 6.6 3.8 - 10.6 K/uL   RBC 4.89 4.40 - 5.90 MIL/uL   Hemoglobin 15.5 13.0 - 18.0 g/dL   HCT 44.9 40.0 - 52.0 %   MCV 91.8 80.0 - 100.0 fL   MCH 31.7 26.0 - 34.0 pg   MCHC 34.5 32.0 - 36.0 g/dL   RDW 13.1 11.5 - 14.5 %   Platelets 219 150 - 440 K/uL  Troponin I     Status: None   Collection Time: 06/02/16 11:18 PM  Result Value Ref Range   Troponin I <0.03 <0.03 ng/mL  Brain natriuretic peptide     Status: None   Collection Time: 06/02/16 11:18 PM  Result Value Ref Range   B Natriuretic Peptide 47.0 0.0 - 100.0 pg/mL  Fibrin derivatives D-Dimer ([facility] only)     Status: Abnormal   Collection Time: 06/02/16 11:19 PM  Result Value Ref Range   Fibrin derivatives D-dimer ([facility]) 501 (H) 0 - 499  Blood gas, venous     Status: Abnormal (Preliminary result)   Collection  Time: 06/02/16 11:52 PM  Result Value Ref Range   pH, Ven 7.37 7.250 - 7.430   pCO2, Ven 55 44.0 - 60.0 mmHg   pO2, Ven PENDING 32.0 - 45.0 mmHg   Bicarbonate 31.8 (H) 20.0 - 28.0 mmol/L   Acid-Base Excess 4.7 (H) 0.0 - 2.0 mmol/L   Patient temperature 37.0    Collection site RIGHT ANTECUBITAL    Sample type VENOUS     Imaging: Dg Chest 2 View  Result Date: 06/03/2016 CLINICAL  DATA:  Retrosternal chest pain. EXAM: CHEST  2 VIEW COMPARISON:  Frontal and lateral views 05/27/2014 FINDINGS: Moderate-sized right pneumothorax of approximately 30-40%. Associated right basilar atelectasis. No definite mediastinal shift. Mild atherosclerosis of the thoracic aorta. No focal abnormality in the left lung. No pleural fluid. Degenerative change in the spine. No acute osseous abnormality. IMPRESSION: Moderate sized right pneumothorax without mediastinal shift. Critical Value/emergent results were called by telephone at the time of interpretation on 06/03/2016 at 12:23 am to Dr. Charlesetta Ivory , who verbally acknowledged these results. Electronically Signed   By: Jeb Levering M.D.   On: 06/03/2016 00:24    Assessment and Plan: This is a 75 y.o. male who presents with a spontaneous right pneumothorax.  I have personally reviewed all of the patients laboratory and imaging studies and have discussed them with the patient.  He will require a right sided chest tube placement.  The risk and benefits of the procedure have been explained to the patient and he is willing to proceed.  He will then be admitted to the surgery service and have a regular diet with appropriate pain control and his home medications.  Chest tube will be to suction -20 cm H20.  We will obtain post-placement CXR and a CXR in the morning as well.   Melvyn Neth, [place]

## 2016-06-04 ENCOUNTER — Inpatient Hospital Stay: Payer: Medicare Other

## 2016-06-04 DIAGNOSIS — J939 Pneumothorax, unspecified: Secondary | ICD-10-CM

## 2016-06-04 NOTE — Care Management Note (Signed)
Case Management Note  Patient Details  Name: Tony Mitchell MRN: [id number] Date of Birth: Jan 04, 1941  Subjective/Objective:      75yo Mr Tony Mitchell was admitted with a right side pneumothorax. Hx; HTN. Mr Tony Mitchell resides at home with his wife. PCP=Dr Hande. Pharmacy=[facility] in [place]. No home oxygen for Mr Tony Mitchell (wife is on oxygen). Has a home CPAP machine which he uses at night. According to Mr Tony Mitchell, he is normally very active at home. Denies previous respiratory issues. No current home health services. Discussed discharge planning. Do not anticipate any home health needs after discharge. Case management will follow for discharge planning.              Action/Plan:   Expected Discharge Date:                  Expected Discharge Plan:     In-House Referral:     Discharge planning Services     Post Acute Care Choice:    Choice offered to:     DME Arranged:    DME Agency:     HH Arranged:    HH Agency:     Status of Service:     If discussed at [employer] of Stay Meetings, dates discussed:    Additional Comments:  Jayni Prescher A, RN 06/04/2016, 10:21 AM

## 2016-06-04 NOTE — Progress Notes (Signed)
  Patient ID: Tony Mitchell, male   DOB: 12-30-1940, 75 y.o.   MRN: [id number]  HISTORY: He feels quite well today. He was able to walk without any significant limitations. He denies any shortness of breath. He remains afebrile.   Vitals:   06/04/16 0936 06/04/16 1117  BP: (!) 169/79 (!) 170/87  Pulse: 64 65  Resp:  19  Temp:  98.2 F (36.8 C)     EXAM:    Resp: Lungs are clear bilaterally.  No respiratory distress, normal effort. Heart:  Regular without murmurs Abd:  Abdomen is soft, non distended and non tender. No masses are palpable.  There is no rebound and no guarding.  Neurological: Alert and oriented to person, place, and time. Coordination normal.  Skin: Skin is warm and dry. No rash noted. No diaphoretic. No erythema. No pallor.  Psychiatric: Normal mood and affect. Normal behavior. Judgment and thought content normal.    ASSESSMENT: I have independently reviewed the patient's chest x-ray. There is no evidence of a pneumothorax. I place the tube to waterseal.   PLAN:   We will obtain a chest x-ray this afternoon. I told the patient that we will also repeat the x-ray tomorrow morning and if there is no evidence of a pneumothorax and no air leak we will remove the tube. HIS questions were answered.    Nestor Lewandowsky, MD

## 2016-06-05 ENCOUNTER — Inpatient Hospital Stay: Payer: Medicare Other

## 2016-06-05 LAB — BLOOD GAS, VENOUS
Acid-Base Excess: 4.7 mmol/L — ABNORMAL HIGH (ref 0.0–2.0)
BICARBONATE: 31.8 mmol/L — AB (ref 20.0–28.0)
PATIENT TEMPERATURE: 37
PCO2 VEN: 55 mmHg (ref 44.0–60.0)
pH, Ven: 7.37 (ref 7.250–7.430)

## 2016-06-05 LAB — CBC
HEMATOCRIT: 40.3 % (ref 40.0–52.0)
HEMOGLOBIN: 13.8 g/dL (ref 13.0–18.0)
MCH: 31.4 pg (ref 26.0–34.0)
MCHC: 34.3 g/dL (ref 32.0–36.0)
MCV: 91.5 fL (ref 80.0–100.0)
Platelets: 200 10*3/uL (ref 150–440)
RBC: 4.41 MIL/uL (ref 4.40–5.90)
RDW: 13 % (ref 11.5–14.5)
WBC: 6.9 10*3/uL (ref 3.8–10.6)

## 2016-06-05 NOTE — Discharge Summary (Signed)
Physician Discharge Summary  Patient ID: Tony Mitchell MRN: [id number] DOB/AGE: 10-29-1940 75 y.o.  Admit date: 06/02/2016 Discharge date: 06/05/2016   Discharge Diagnoses:  Active Problems:   Pneumothorax on right   Procedures:Insertion of right sided chest tube  Hospital Course: 75 yo male admitted with 70 - 24 ours of shortness of breath.  Chest xray in ER showed large right sided pneumothorax treated with a percutaneous chest drain.  Air leak resolved and chest xray after water seal confirmed no pneumothorax and no air leak.  Chest tube removed and discharge instructions given.  Will see Dr. Genevive Bi in 10 days.    Disposition: Final discharge disposition not confirmed  Discharge Instructions    Call MD for:  redness, tenderness, or signs of infection (pain, swelling, redness, odor or green/yellow discharge around incision site)    Complete by:  As directed    Diet - low sodium heart healthy    Complete by:  As directed    Discharge wound care:    Complete by:  As directed    Leave dressing in place for 48 hours.  Remove at that time and wash over area with soap and water.  Cover with BandAid. See Dr. Genevive Bi in the office in about 10 days with a chest xray prior to visit.   Increase activity slowly    Complete by:  As directed        Medication List    TAKE these medications   allopurinol 100 MG tablet Commonly known as:  ZYLOPRIM Take 100 mg by mouth daily.   ALPRAZolam 0.5 MG tablet Commonly known as:  XANAX Take 0.5 mg by mouth at bedtime as needed for anxiety.   aspirin EC 81 MG tablet Take 81 mg by mouth daily.   cloNIDine 0.3 MG tablet Commonly known as:  CATAPRES Take 0.3 mg by mouth 3 (three) times daily.   hydrALAZINE 50 MG tablet Commonly known as:  APRESOLINE Take 50 mg by mouth 3 (three) times daily.   hydrochlorothiazide 25 MG tablet Commonly known as:  HYDRODIURIL Take 25 mg by mouth daily.   labetalol 300 MG tablet Commonly known as:   NORMODYNE Take 300 mg by mouth 2 (two) times daily.   lisinopril 40 MG tablet Commonly known as:  PRINIVIL,ZESTRIL Take 40 mg by mouth daily.   meloxicam 15 MG tablet Commonly known as:  MOBIC Take 15 mg by mouth daily.   sertraline 25 MG tablet Commonly known as:  ZOLOFT Take 25 mg by mouth daily.   sildenafil 20 MG tablet Commonly known as:  REVATIO Take 100 mg by mouth daily as needed (erectile dysfunction).   simvastatin 40 MG tablet Commonly known as:  ZOCOR Take 40 mg by mouth at bedtime.        Nestor Lewandowsky, MD

## 2016-06-05 NOTE — Progress Notes (Signed)
Did well overnight.  Not short of breath  No air leak.  Independent review of chest xray from this morning shows no pneumothorax  Chest tube site is clean and dry. No erythema.   Afebrile.  BP stable  Chest tube removed without difficulty  Discharge instructions given  Will see in 2 weeks.

## 2016-06-05 NOTE — Progress Notes (Signed)
Alert and oriented. Vital signs stable . No signs of acute distress. Discharge instructions given. Patient verbalized understanding. No other issues noted at this time.    

## 2016-06-13 ENCOUNTER — Other Ambulatory Visit: Payer: Self-pay

## 2016-06-13 DIAGNOSIS — K219 Gastro-esophageal reflux disease without esophagitis: Secondary | ICD-10-CM | POA: Insufficient documentation

## 2016-06-13 DIAGNOSIS — I10 Essential (primary) hypertension: Secondary | ICD-10-CM | POA: Insufficient documentation

## 2016-06-13 DIAGNOSIS — E781 Pure hyperglyceridemia: Secondary | ICD-10-CM | POA: Insufficient documentation

## 2016-06-13 DIAGNOSIS — M109 Gout, unspecified: Secondary | ICD-10-CM | POA: Insufficient documentation

## 2016-06-13 DIAGNOSIS — J939 Pneumothorax, unspecified: Secondary | ICD-10-CM

## 2016-06-13 DIAGNOSIS — E785 Hyperlipidemia, unspecified: Secondary | ICD-10-CM | POA: Insufficient documentation

## 2016-06-14 ENCOUNTER — Other Ambulatory Visit: Payer: Self-pay | Admitting: Cardiothoracic Surgery

## 2016-06-14 DIAGNOSIS — J9383 Other pneumothorax: Secondary | ICD-10-CM

## 2016-06-15 ENCOUNTER — Encounter: Payer: Self-pay | Admitting: Cardiothoracic Surgery

## 2016-06-15 ENCOUNTER — Ambulatory Visit (INDEPENDENT_AMBULATORY_CARE_PROVIDER_SITE_OTHER): Payer: Medicare Other | Admitting: Cardiothoracic Surgery

## 2016-06-15 ENCOUNTER — Ambulatory Visit
Admission: RE | Admit: 2016-06-15 | Discharge: 2016-06-15 | Disposition: A | Discharge status: Home / Self Care | Payer: Medicare Other | Source: Ambulatory Visit | Attending: Cardiothoracic Surgery | Admitting: Cardiothoracic Surgery

## 2016-06-15 VITALS — BP 126/74 | HR 77 | Temp 98.2°F | Resp 22 | Ht 69.0 in | Wt 193.4 lb

## 2016-06-15 DIAGNOSIS — J9383 Other pneumothorax: Secondary | ICD-10-CM | POA: Diagnosis not present

## 2016-06-15 DIAGNOSIS — I700 Atherosclerosis of aorta: Secondary | ICD-10-CM | POA: Diagnosis not present

## 2016-06-15 DIAGNOSIS — J939 Pneumothorax, unspecified: Secondary | ICD-10-CM | POA: Diagnosis not present

## 2016-06-15 NOTE — Progress Notes (Signed)
  Patient ID: Tony Mitchell, male   DOB: 1941/01/13, 75 y.o.   MRN: [id number]  HISTORY: This patient returns today in follow-up. He was admitted for a right sided spontaneous pneumothorax. He has no new complaints today. He has been checking his blood pressure anywhere between 5-10 times per day and has noticed a slight decline over the last several weeks to a more normotensive level. Because of that he's stopped his hydralazine. He has not had any significant shortness of breath cough or fevers.   Vitals:   06/15/16 0822  BP: 126/74  Pulse: 77  Resp: (!) 22  Temp: 98.2 F (36.8 C)     EXAM:    Resp: Lungs are clear bilaterally But with dry rales at both bases..  No respiratory distress, normal effort. Heart:  Regular without murmurs Abd:  Abdomen is soft, non distended and non tender. No masses are palpable.  There is no rebound and no guarding.  Neurological: Alert and oriented to person, place, and time. Coordination normal.  Skin: Skin is warm and dry. No rash noted. No diaphoretic. No erythema. No pallor.  The chest tube site is clean and dry without erythema or drainage  Psychiatric: Normal mood and affect. Normal behavior. Judgment and thought content normal.    ASSESSMENT: I have independently reviewed the patient's chest x-ray. There is no signs of pneumothorax. There is very small right-sided pleural effusion. There is no acute cardiopulmonary disease.   PLAN:   I did not make a return visit for this patient but would be happy to see him should the need arise. I did counsel him on his use of the incentive spirometer. We also discussed the possibility that this may occur in the future and if so how best to manage that. All his questions were answered.  Tony Lewandowsky, MD

## 2016-06-15 NOTE — Patient Instructions (Signed)
Please call with any questions or concerns  Please review the symptoms below and if you ever feel like you have this occur again, go to the Emergency Room Immediately.   Pneumothorax A pneumothorax, commonly called a collapsed lung, is a condition in which air leaks from a lung and builds up in the space between the lung and the chest wall (pleural space). The air in a pneumothorax is trapped outside the lung and takes up space, preventing the lung from fully expanding. This is a condition that usually occurs suddenly. The buildup of air may be small or large. A small pneumothorax may go away on its own. When a pneumothorax is larger, it will often require medical treatment and hospitalization.  CAUSES  A pneumothorax can sometimes happen quickly with no apparent cause. People with underlying lung problems, particularly COPD or emphysema, are at higher risk of pneumothorax. However, pneumothorax can happen quickly even in people with no prior known lung problems. Trauma, surgery, medical procedures, or injury to the chest wall can also cause a pneumothorax. SIGNS AND SYMPTOMS  Sometimes a pneumothorax will have no symptoms. When symptoms are present, they can include:  Chest pain.  Shortness of breath.  Increased rate of breathing.  Bluish color to your lips or skin (cyanosis). DIAGNOSIS  Pneumothorax is usually diagnosed by a chest X-ray or chest CT scan. Your health care provider will also take a medical history and perform a physical exam to determine why you may have a pneumothorax. TREATMENT  A small pneumothorax may go away on its own without treatment. Extra oxygen can sometimes help a small pneumothorax go away more quickly. For a larger pneumothorax or a pneumothorax that is causing symptoms, a procedure is usually needed to drain the air.In some cases, the health care provider may drain the air using a needle. In other cases, a chest tube may be inserted into the pleural space. A  chest tube is a small tube placed between the ribs and into the pleural space. This removes the extra air and allows the lung to expand back to its normal size. A large pneumothorax will usually require a hospital stay. If there is ongoing air leakage into the pleural space, then the chest tube may need to remain in place for several days until the air leak has healed. In some cases, surgery may be needed.  HOME CARE INSTRUCTIONS   Only take over-the-counter or prescription medicines as directed by your health care provider.  If a cough or pain makes it difficult for you to sleep at night, try sleeping in a semi-upright position in a recliner or by using 2 or 3 pillows.  Rest and limit activity as directed by your health care provider.  If you had a chest tube and it was removed, ask your health care provider when it is okay to remove the dressing. Until your health care provider says you can remove the dressing, do not allow it to get wet.  Do not smoke. Smoking is a risk factor for pneumothorax.  Do not fly in an airplane or scuba dive until your health care provider says it is okay.  Follow up with your health care provider as directed. SEEK IMMEDIATE MEDICAL CARE IF:   You have increasing chest pain or shortness of breath.  You have a cough that is not controlled with suppressants.  You begin coughing up blood.  You have pain that is getting worse or is not controlled with medicines.  You cough  up thick, discolored mucus (sputum) that is yellow to green in color.  You have redness, increasing pain, or discharge at the site where a chest tube had been in place (if your pneumothorax was treated with a chest tube).  The site where your chest tube was located opens up.  You feel air coming out of the site where the chest tube was placed.  You have a fever or persistent symptoms for more than 2-3 days.  You have a fever and your symptoms suddenly get worse. MAKE SURE YOU:    Understand these instructions.  Will watch your condition.  Will get help right away if you are not doing well or get worse.   This information is not intended to replace advice given to you by your health care provider. Make sure you discuss any questions you have with your health care provider.   Document Released: 08/06/2005 Document Revised: 05/27/2013 Document Reviewed: 03/05/2013 Elsevier Interactive Patient Education [employer].

## 2016-06-18 ENCOUNTER — Other Ambulatory Visit: Payer: Self-pay

## 2016-06-18 DIAGNOSIS — R972 Elevated prostate specific antigen [PSA]: Secondary | ICD-10-CM

## 2016-06-20 ENCOUNTER — Other Ambulatory Visit: Payer: Medicare Other

## 2016-06-20 DIAGNOSIS — R972 Elevated prostate specific antigen [PSA]: Secondary | ICD-10-CM

## 2016-06-21 LAB — PSA: PROSTATE SPECIFIC AG, SERUM: 5.7 ng/mL — AB (ref 0.0–4.0)

## 2016-06-25 ENCOUNTER — Telehealth: Payer: Self-pay

## 2016-06-25 NOTE — Telephone Encounter (Signed)
Spoke with pt wife in reference to PSA results. Wife voiced understanding.  

## 2016-06-25 NOTE — Telephone Encounter (Signed)
-----   Message from Ardis Hughs, MD sent at 06/22/2016  5:49 AM EDT ----- Please let the patient know that his PSA has come down significantly since last check, which is really good news.

## 2016-06-26 ENCOUNTER — Ambulatory Visit (INDEPENDENT_AMBULATORY_CARE_PROVIDER_SITE_OTHER): Payer: Medicare Other | Admitting: Urology

## 2016-06-26 ENCOUNTER — Encounter: Payer: Self-pay | Admitting: Urology

## 2016-06-26 VITALS — BP 166/78 | HR 65 | Ht 66.5 in | Wt 200.1 lb

## 2016-06-26 DIAGNOSIS — R972 Elevated prostate specific antigen [PSA]: Secondary | ICD-10-CM | POA: Diagnosis not present

## 2016-06-26 NOTE — Progress Notes (Signed)
06/26/2016 3:19 PM   [place] 1941-08-11 [id number]  Referring provider: Tracie Harrier, MD [address] [employer] [place], [place] [address]  Chief Complaint  Patient presents with  . Elevated PSA  . Other    HPI: 76 year old male was referred for an elevated PSA. The patient states that this was noted on a routine annual physical exam. The patient states that he has not had any significant progression of his voiding symptoms including frequency, urgency, or incontinence. He has not noted any blood in his urine. However, the patient has been treated approximately 7 or 8 months ago for epididymitis with tender to an sore testicles. The antibiotics seemed to help his pain and symptoms although this is lingered for a period of time. He denies any constipation.   The patient states that he has had PSA screening for many years. He had a elevated PSA 15 years ago which prompted a prostate biopsy. This was negative. He does not recall any specific numbers although he is been told that they've been normal. His never had an abnormal rectal exam. He has no family history of prostate cancer.  The patient has baseline erectile dysfunction.  Interval: The patient presents today 3 months after his last visit doing well. He states that following his last visit he has no longer any severe or bothersome voiding symptoms. He also states that he has now been having spontaneous erections. He is getting up less at night and his stream is stronger. The patient also states that he was diagnosed with a collapse along about 3 weeks ago and had a chest tube placed. This point, the patient is feeling back to normal.  PSA: 06/20/16-5.7 04/10/16-8.2 (30 1.8 percent free) 03/22/16-7.39     PMH: Past Medical History:  Diagnosis Date  . Coronary artery disease   . Gout   . Hyperlipidemia   . Hypertension   . Sleep apnea     Surgical History: Past Surgical History:  Procedure  Laterality Date  . CATARACT EXTRACTION  2012  . KNEE SURGERY Left 1965    Home Medications:    Medication List       Accurate as of 06/26/16  3:19 PM. Always use your most recent med list.          allopurinol 100 MG tablet Commonly known as:  ZYLOPRIM Take 100 mg by mouth daily.   ALPRAZolam 0.5 MG tablet Commonly known as:  XANAX Take 0.5 mg by mouth at bedtime as needed for anxiety.   aspirin EC 81 MG tablet Take 81 mg by mouth daily.   cloNIDine 0.3 MG tablet Commonly known as:  CATAPRES Take 0.3 mg by mouth 2 (two) times daily.   hydrALAZINE 50 MG tablet Commonly known as:  APRESOLINE Take 50 mg by mouth daily.   hydrochlorothiazide 25 MG tablet Commonly known as:  HYDRODIURIL Take 25 mg by mouth daily.   labetalol 300 MG tablet Commonly known as:  NORMODYNE Take 300 mg by mouth 2 (two) times daily.   lisinopril 40 MG tablet Commonly known as:  PRINIVIL,ZESTRIL TAKE 1 TABLET BY MOUTH EVERY DAY   meloxicam 15 MG tablet Commonly known as:  MOBIC Take 15 mg by mouth daily.   sildenafil 20 MG tablet Commonly known as:  REVATIO Take 100 mg by mouth daily as needed (erectile dysfunction).   simvastatin 40 MG tablet Commonly known as:  ZOCOR Take 40 mg by mouth at bedtime.  Allergies: No Known Allergies  Family History: Family History  Problem Relation Age of Onset  . Cancer Father     Unknown  . Prostate cancer Neg Hx   . Lung cancer Neg Hx     Social History:  reports that he quit smoking about 20 years ago. His smoking use included Cigarettes. He has never used smokeless tobacco. He reports that he drinks alcohol. He reports that he does not use drugs.  ROS: UROLOGY Frequent Urination?: No Hard to postpone urination?: No Burning/pain with urination?: No Get up at night to urinate?: Yes Leakage of urine?: No Urine stream starts and stops?: No Trouble starting stream?: No Do you have to strain to urinate?: Yes Blood in urine?:  No Urinary tract infection?: No Sexually transmitted disease?: No Injury to kidneys or bladder?: No Painful intercourse?: No Weak stream?: No Erection problems?: No Penile pain?: No  Gastrointestinal Nausea?: No Vomiting?: No Indigestion/heartburn?: No Diarrhea?: No Constipation?: No  Constitutional Fever: No Night sweats?: No Weight loss?: No Fatigue?: No  Skin Skin rash/lesions?: No Itching?: No  Eyes Blurred vision?: No Double vision?: No  Ears/Nose/Throat Sore throat?: No Sinus problems?: No  Hematologic/Lymphatic Swollen glands?: No Easy bruising?: No  Cardiovascular Leg swelling?: No Chest pain?: No  Respiratory Cough?: No Shortness of breath?: No  Endocrine Excessive thirst?: No  Musculoskeletal Back pain?: Yes Joint pain?: Yes  Neurological Headaches?: No Dizziness?: No  Psychologic Depression?: No Anxiety?: No  Physical Exam: BP (!) 166/78   Pulse 65   Ht 5' 6.5" (1.689 m)   Wt 90.8 kg (200 lb 1.6 oz)   BMI 31.81 kg/m   Constitutional:  Alert and oriented, No acute distress. HEENT: [place] AT, moist mucus membranes.  Trachea midline, no masses. Cardiovascular: No clubbing, cyanosis, or edema. Respiratory: Normal respiratory effort, no increased work of breathing. GI: Abdomen is soft, nontender, nondistended, no abdominal masses GU: No CVA tenderness.  04/10/16-DRE: 40 g prostate, smooth, symmetric, no appreciable nodules Skin: No rashes, bruises or suspicious lesions. Lymph: No cervical or inguinal adenopathy. Neurologic: Grossly intact, no focal deficits, moving all 4 extremities. Psychiatric: Normal mood and affect.  Laboratory Data: Lab Results  Component Value Date   WBC 6.9 06/05/2016   HGB 13.8 06/05/2016   HCT 40.3 06/05/2016   MCV 91.5 06/05/2016   PLT 200 06/05/2016    Lab Results  Component Value Date   CREATININE 1.37 (H) 06/03/2016    No results found for: PSA  No results found for: TESTOSTERONE  No  results found for: HGBA1C  Urinalysis No results found for: COLORURINE, APPEARANCEUR, LABSPEC, PHURINE, GLUCOSEU, HGBUR, BILIRUBINUR, KETONESUR, PROTEINUR, UROBILINOGEN, NITRITE, LEUKOCYTESUR  Pertinent Imaging: None   Assessment & Plan:  The patient's PSA is trending down currently from a point 2 to 5.7. His voiding symptoms have largely improved since his last seen despite being on any medications. As such, I recommended that we continue to trend his PSA, again in 6 months.  1. Elevated PSA   - PSA Total (Reflex To Free)   Return in about 6 months (around 12/24/2016) for w/ PSA prior.  Ardis Hughs, [facility] [address], [place], [place] [address] [phone]

## 2016-12-03 ENCOUNTER — Ambulatory Visit
Admission: EM | Admit: 2016-12-03 | Discharge: 2016-12-03 | Disposition: A | Discharge status: Home / Self Care | Payer: Medicare Other | Attending: Family Medicine | Admitting: Family Medicine

## 2016-12-03 DIAGNOSIS — R69 Illness, unspecified: Secondary | ICD-10-CM

## 2016-12-03 DIAGNOSIS — J111 Influenza due to unidentified influenza virus with other respiratory manifestations: Secondary | ICD-10-CM

## 2016-12-03 DIAGNOSIS — J029 Acute pharyngitis, unspecified: Secondary | ICD-10-CM | POA: Diagnosis not present

## 2016-12-03 DIAGNOSIS — R0981 Nasal congestion: Secondary | ICD-10-CM | POA: Diagnosis not present

## 2016-12-03 DIAGNOSIS — R05 Cough: Secondary | ICD-10-CM

## 2016-12-03 MED ORDER — HYDROCOD POLST-CPM POLST ER 10-8 MG/5ML PO SUER: 5.0000 mL | Freq: Every evening | ORAL | 0 refills | Status: DC | PRN

## 2016-12-03 MED ORDER — OSELTAMIVIR PHOSPHATE 75 MG PO CAPS: 75.0000 mg | ORAL_CAPSULE | Freq: Two times a day (BID) | ORAL | 0 refills | Status: DC

## 2016-12-03 NOTE — ED Provider Notes (Signed)
[facility]    CSN: [id number] Arrival date & time: 12/03/16  1660     History   Chief Complaint Chief Complaint  Patient presents with  . URI    HPI Tony Mitchell is a 76 y.o. male.    URI  Presenting symptoms: congestion, cough, fatigue, fever and sore throat   Severity:  Moderate Onset quality:  Sudden Duration:  3 days Timing:  Constant Progression:  Worsening Chronicity:  New Relieved by:  None tried Ineffective treatments:  None tried Associated symptoms: myalgias   Associated symptoms: no headaches, no sinus pain and no wheezing   Risk factors: being elderly, chronic cardiac disease and sick contacts (wife in hospital with flu)   Risk factors: no chronic kidney disease, no chronic respiratory disease, no diabetes mellitus, no immunosuppression, no recent illness and no recent travel     Past Medical History:  Diagnosis Date  . Coronary artery disease   . Gout   . Hyperlipidemia   . Hypertension   . Sleep apnea     Patient Active Problem List   Diagnosis Date Noted  . GERD (gastroesophageal reflux disease) 06/13/2016  . Gout 06/13/2016  . Hyperlipidemia, unspecified 06/13/2016  . Hypertension 06/13/2016  . Pneumothorax on right 06/03/2016  . Elevated PSA 03/29/2016  . Elevated blood sugar 09/29/2015  . Rash 02/17/2015  . OSA (obstructive sleep apnea) 07/08/2014  . S/P cardiac catheterization 05/28/2014  . SOB (shortness of breath) on exertion 05/28/2014    Past Surgical History:  Procedure Laterality Date  . CATARACT EXTRACTION  2012  . KNEE SURGERY Left 1965       Home Medications    Prior to Admission medications   Medication Sig Start Date End Date Taking? Authorizing Provider  allopurinol (ZYLOPRIM) 100 MG tablet Take 100 mg by mouth daily.   Yes Historical Provider, MD  ALPRAZolam Duanne Moron) 0.5 MG tablet Take 0.5 mg by mouth at bedtime as needed for anxiety.   Yes Historical Provider, MD  aspirin EC 81 MG tablet Take  81 mg by mouth daily.   Yes Historical Provider, MD  cloNIDine (CATAPRES) 0.3 MG tablet Take 0.3 mg by mouth 2 (two) times daily.    Yes Historical Provider, MD  hydrALAZINE (APRESOLINE) 50 MG tablet Take 50 mg by mouth daily.    Yes Historical Provider, MD  hydrochlorothiazide (HYDRODIURIL) 25 MG tablet Take 25 mg by mouth daily. 05/14/16  Yes Historical Provider, MD  labetalol (NORMODYNE) 300 MG tablet Take 300 mg by mouth 2 (two) times daily.   Yes Historical Provider, MD  lisinopril (PRINIVIL,ZESTRIL) 40 MG tablet TAKE 1 TABLET BY MOUTH EVERY DAY 06/18/16  Yes Historical Provider, MD  sildenafil (REVATIO) 20 MG tablet Take 100 mg by mouth daily as needed (erectile dysfunction).    Yes Historical Provider, MD  simvastatin (ZOCOR) 40 MG tablet Take 40 mg by mouth at bedtime.    Yes Historical Provider, MD  chlorpheniramine-HYDROcodone (TUSSIONEX PENNKINETIC ER) 10-8 MG/5ML SUER Take 5 mLs by mouth at bedtime as needed. 12/03/16   Norval Gable, MD  oseltamivir (TAMIFLU) 75 MG capsule Take 1 capsule (75 mg total) by mouth 2 (two) times daily. 12/03/16   Norval Gable, MD    Family History Family History  Problem Relation Age of Onset  . Cancer Father     Unknown  . Prostate cancer Neg Hx   . Lung cancer Neg Hx     Social History Social History  Substance Use Topics  .  Smoking status: Former Smoker    Types: Cigarettes    Quit date: 06/15/1996  . Smokeless tobacco: Never Used  . Alcohol use Yes     Comment: 1 mixed drink daily     Allergies   Patient has no known allergies.   Review of Systems Review of Systems  Constitutional: Positive for fatigue and fever.  HENT: Positive for congestion and sore throat. Negative for sinus pain.   Respiratory: Positive for cough. Negative for wheezing.   Musculoskeletal: Positive for myalgias.  Neurological: Negative for headaches.     Physical Exam Triage Vital Signs ED Triage Vitals  Enc Vitals Group     BP 12/03/16 1119 (!) 195/69      Pulse Rate 12/03/16 1119 66     Resp 12/03/16 1119 18     Temp 12/03/16 1119 97.8 F (36.6 C)     Temp Source 12/03/16 1119 Oral     SpO2 12/03/16 1119 98 %     Weight 12/03/16 1123 198 lb (89.8 kg)     Height 12/03/16 1123 5\' 9"  (1.753 m)     Head Circumference --      Peak Flow --      Pain Score 12/03/16 1114 3     Pain Loc --      Pain Edu? --      Excl. in [place]? --    No data found.   Updated Vital Signs BP (!) 195/69 (BP Location: Left Arm)   Pulse 66   Temp 97.8 F (36.6 C) (Oral)   Resp 18   Ht 5\' 9"  (1.753 m)   Wt 198 lb (89.8 kg)   SpO2 98%   BMI 29.24 kg/m   Visual Acuity Right Eye Distance:   Left Eye Distance:   Bilateral Distance:    Right Eye Near:   Left Eye Near:    Bilateral Near:     Physical Exam  Constitutional: He appears well-developed and well-nourished. No distress.  HENT:  Head: Normocephalic and atraumatic.  Right Ear: Tympanic membrane, external ear and ear canal normal.  Left Ear: Tympanic membrane, external ear and ear canal normal.  Nose: Nose normal.  Mouth/Throat: Uvula is midline, oropharynx is clear and moist and mucous membranes are normal. No oropharyngeal exudate or tonsillar abscesses.  Eyes: Conjunctivae and EOM are normal. Pupils are equal, round, and reactive to light. Right eye exhibits no discharge. Left eye exhibits no discharge. No scleral icterus.  Neck: Normal range of motion. Neck supple. No tracheal deviation present. No thyromegaly present.  Cardiovascular: Normal rate, regular rhythm and normal heart sounds.   Pulmonary/Chest: Effort normal and breath sounds normal. No stridor. No respiratory distress. He has no wheezes. He has no rales. He exhibits no tenderness.  Lymphadenopathy:    He has no cervical adenopathy.  Neurological: He is alert.  Skin: Skin is warm and dry. No rash noted. He is not diaphoretic.  Nursing note and vitals reviewed.    UC Treatments / Results  Labs (all labs ordered are  listed, but only abnormal results are displayed) Labs Reviewed - No data to display  EKG  EKG Interpretation None       Radiology No results found.  Procedures Procedures (including critical care time)  Medications Ordered in UC Medications - No data to display   Initial Impression / Assessment and Plan / UC Course  I have reviewed the triage vital signs and the nursing notes.  Pertinent labs & imaging results  that were available during my care of the patient were reviewed by me and considered in my medical decision making (see chart for details).       Final Clinical Impressions(s) / UC Diagnoses   Final diagnoses:  Influenza-like illness    New Prescriptions Discharge Medication List as of 12/03/2016 11:43 AM    START taking these medications   Details  chlorpheniramine-HYDROcodone (TUSSIONEX PENNKINETIC ER) 10-8 MG/5ML SUER Take 5 mLs by mouth at bedtime as needed., Starting Mon 12/03/2016, Normal    oseltamivir (TAMIFLU) 75 MG capsule Take 1 capsule (75 mg total) by mouth 2 (two) times daily., Starting Mon 12/03/2016, Normal       1. diagnosis reviewed with patient 2. rx as per orders above; reviewed possible side effects, interactions, risks and benefits  3. Recommend supportive treatment with rest, fluids 4. Follow-up prn if symptoms worsen or don't improve   Norval Gable, MD 12/03/16 1156

## 2016-12-03 NOTE — ED Triage Notes (Signed)
Pt c/o watery eyes, increased blood pressure, scratchy throat, body aches, short of breath, and not feeling well since yesterday. His wife is in the ICU at [facility] right now and they told him she tested positive for the flu. He says his eyes are draining and crusty.

## 2016-12-12 ENCOUNTER — Other Ambulatory Visit: Payer: Self-pay | Admitting: Internal Medicine

## 2016-12-12 DIAGNOSIS — M7918 Myalgia, other site: Secondary | ICD-10-CM

## 2016-12-12 DIAGNOSIS — M792 Neuralgia and neuritis, unspecified: Secondary | ICD-10-CM

## 2016-12-19 ENCOUNTER — Ambulatory Visit
Admission: RE | Admit: 2016-12-19 | Discharge: 2016-12-19 | Disposition: A | Discharge status: Home / Self Care | Payer: Medicare Other | Source: Ambulatory Visit | Attending: Internal Medicine | Admitting: Internal Medicine

## 2016-12-19 ENCOUNTER — Other Ambulatory Visit: Payer: Self-pay

## 2016-12-19 DIAGNOSIS — M4328 Fusion of spine, sacral and sacrococcygeal region: Secondary | ICD-10-CM | POA: Diagnosis not present

## 2016-12-19 DIAGNOSIS — G5790 Unspecified mononeuropathy of unspecified lower limb: Secondary | ICD-10-CM | POA: Diagnosis not present

## 2016-12-19 DIAGNOSIS — M5126 Other intervertebral disc displacement, lumbar region: Secondary | ICD-10-CM | POA: Insufficient documentation

## 2016-12-19 DIAGNOSIS — M5136 Other intervertebral disc degeneration, lumbar region: Secondary | ICD-10-CM | POA: Diagnosis not present

## 2016-12-19 DIAGNOSIS — M48061 Spinal stenosis, lumbar region without neurogenic claudication: Secondary | ICD-10-CM | POA: Diagnosis not present

## 2016-12-19 DIAGNOSIS — M791 Myalgia: Secondary | ICD-10-CM | POA: Insufficient documentation

## 2016-12-19 DIAGNOSIS — M792 Neuralgia and neuritis, unspecified: Secondary | ICD-10-CM

## 2016-12-19 DIAGNOSIS — R972 Elevated prostate specific antigen [PSA]: Secondary | ICD-10-CM

## 2016-12-19 DIAGNOSIS — M7918 Myalgia, other site: Secondary | ICD-10-CM

## 2016-12-20 ENCOUNTER — Other Ambulatory Visit: Payer: Medicare Other

## 2016-12-20 DIAGNOSIS — R972 Elevated prostate specific antigen [PSA]: Secondary | ICD-10-CM

## 2016-12-21 LAB — PSA: Prostate Specific Ag, Serum: 6.6 ng/mL — ABNORMAL HIGH (ref 0.0–4.0)

## 2016-12-24 ENCOUNTER — Ambulatory Visit (INDEPENDENT_AMBULATORY_CARE_PROVIDER_SITE_OTHER): Payer: Medicare Other | Admitting: Urology

## 2016-12-24 ENCOUNTER — Encounter: Payer: Self-pay | Admitting: Urology

## 2016-12-24 ENCOUNTER — Telehealth: Payer: Self-pay | Admitting: Urology

## 2016-12-24 VITALS — BP 162/96 | HR 62 | Ht 69.5 in | Wt 196.6 lb

## 2016-12-24 DIAGNOSIS — R972 Elevated prostate specific antigen [PSA]: Secondary | ICD-10-CM

## 2016-12-24 DIAGNOSIS — R351 Nocturia: Secondary | ICD-10-CM | POA: Diagnosis not present

## 2016-12-24 NOTE — Progress Notes (Signed)
12/24/2016 9:13 AM   [place] January 20, 1941 [id number]  Referring provider: Tracie Harrier, MD [address] [facility] [place], [place] [address]  Chief Complaint  Patient presents with  . Follow-up    elevated PSA    HPI: Dr Louis Meckel The patient was having minimal voiding complaints and had a distant history of epididymitis. Years ago he had a negative prostate biopsy. On the last visit the PSA history has been documented and in November 2017 the PSA had decreased from 8.2 down to 5.7.  Today Frequency and variable flow are stable. No blood or infection.  His PSA is 6.6     PMH: Past Medical History:  Diagnosis Date  . Coronary artery disease   . Gout   . Hyperlipidemia   . Hypertension   . Sleep apnea     Surgical History: Past Surgical History:  Procedure Laterality Date  . CATARACT EXTRACTION  2012  . KNEE SURGERY Left 1965    Home Medications:  Allergies as of 12/24/2016   No Known Allergies     Medication List       Accurate as of 12/24/16  9:13 AM. Always use your most recent med list.          allopurinol 100 MG tablet Commonly known as:  ZYLOPRIM Take 100 mg by mouth daily.   ALPRAZolam 0.5 MG tablet Commonly known as:  XANAX Take 0.5 mg by mouth at bedtime as needed for anxiety.   aspirin EC 81 MG tablet Take 81 mg by mouth daily.   cloNIDine 0.3 MG tablet Commonly known as:  CATAPRES Take 0.3 mg by mouth 2 (two) times daily.   gabapentin 300 MG capsule Commonly known as:  NEURONTIN Take 300 mg by mouth 3 (three) times daily.   hydrALAZINE 50 MG tablet Commonly known as:  APRESOLINE Take 50 mg by mouth daily.   hydrochlorothiazide 25 MG tablet Commonly known as:  HYDRODIURIL Take 25 mg by mouth daily.   ibuprofen 800 MG tablet Commonly known as:  ADVIL,MOTRIN Take 800 mg by mouth 3 (three) times daily.   labetalol 300 MG tablet Commonly known as:  NORMODYNE Take 300 mg by mouth 2 (two) times  daily.   lisinopril 40 MG tablet Commonly known as:  PRINIVIL,ZESTRIL TAKE 1 TABLET BY MOUTH EVERY DAY   sildenafil 20 MG tablet Commonly known as:  REVATIO Take 100 mg by mouth daily as needed (erectile dysfunction).       Allergies: No Known Allergies  Family History: Family History  Problem Relation Age of Onset  . Cancer Father     Unknown  . Prostate cancer Neg Hx   . Lung cancer Neg Hx     Social History:  reports that he quit smoking about 20 years ago. His smoking use included Cigarettes. He has never used smokeless tobacco. He reports that he drinks alcohol. He reports that he does not use drugs.  ROS: UROLOGY Frequent Urination?: No Hard to postpone urination?: No Burning/pain with urination?: No Get up at night to urinate?: No Leakage of urine?: No Urine stream starts and stops?: No Trouble starting stream?: No Do you have to strain to urinate?: No Blood in urine?: No Urinary tract infection?: No Sexually transmitted disease?: No Injury to kidneys or bladder?: No Painful intercourse?: No Weak stream?: No Erection problems?: No Penile pain?: No  Gastrointestinal Nausea?: No Vomiting?: No Indigestion/heartburn?: No Diarrhea?: No Constipation?: No  Constitutional Fever: No Night sweats?: No Weight  loss?: No Fatigue?: No  Skin Skin rash/lesions?: No Itching?: No  Eyes Blurred vision?: No Double vision?: No  Ears/Nose/Throat Sore throat?: No Sinus problems?: No  Hematologic/Lymphatic Swollen glands?: No Easy bruising?: No  Cardiovascular Leg swelling?: No Chest pain?: No  Respiratory Cough?: No Shortness of breath?: No  Endocrine Excessive thirst?: No  Musculoskeletal Back pain?: No Joint pain?: Yes  Neurological Headaches?: No Dizziness?: No  Psychologic Depression?: No Anxiety?: No  Physical Exam: BP (!) 162/96   Pulse 62   Ht 5' 9.5" (1.765 m)   Wt 196 lb 9.6 oz (89.2 kg)   BMI 28.62 kg/m     Laboratory  Data: Lab Results  Component Value Date   WBC 6.9 06/05/2016   HGB 13.8 06/05/2016   HCT 40.3 06/05/2016   MCV 91.5 06/05/2016   PLT 200 06/05/2016    Lab Results  Component Value Date   CREATININE 1.37 (H) 06/03/2016    No results found for: PSA  No results found for: TESTOSTERONE  No results found for: HGBA1C  Urinalysis No results found for: COLORURINE, APPEARANCEUR, LABSPEC, PHURINE, GLUCOSEU, HGBUR, BILIRUBINUR, KETONESUR, PROTEINUR, UROBILINOGEN, NITRITE, LEUKOCYTESUR  Pertinent Imaging: none  Assessment & Plan:  The patient has a mildly elevated PSA with a distant history of the same. He is 76 years of age. We will repeat his PSA in approximately 6 months and have him review the findings with one of the urologist then. 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  There are no diagnoses linked to this encounter.  No Follow-up on file.  Reece Packer, MD  [facility] [address], [place], [place] [address] [phone]

## 2016-12-24 NOTE — Telephone Encounter (Signed)
Pt was seen by Dr. Matilde Sprang today, at check out the pt complains of parking situation here at new location, states he lives in [place] and wants to be seen in [place].  I scheduled his 6 mos follow up with [facility], however Dr. Matilde Sprang also wants him to have PSA prior. Please put orders in computer for the pt to be able to get PSA prior to his 6 mos follow up with [facility], as I have instructed him that he needs to have this drawn prior to his appt with [facility]. Thanks.

## 2016-12-24 NOTE — Telephone Encounter (Signed)
Orders placed for labs to be drawn in [place].

## 2016-12-26 ENCOUNTER — Telehealth: Payer: Self-pay

## 2016-12-26 NOTE — Telephone Encounter (Signed)
Ardis Hughs, MD  Toniann Fail C, LPN        PSA has risen slight since we last checked it. We can discuss at your follow-up visit.    Spoke with pt wife in reference to PSA results and f/u. Wife voiced understanding.

## 2017-06-20 ENCOUNTER — Other Ambulatory Visit
Admission: RE | Admit: 2017-06-20 | Discharge: 2017-06-20 | Disposition: A | Discharge status: Home / Self Care | Payer: Medicare Other | Source: Ambulatory Visit | Attending: Urology | Admitting: Urology

## 2017-06-20 DIAGNOSIS — R972 Elevated prostate specific antigen [PSA]: Secondary | ICD-10-CM | POA: Insufficient documentation

## 2017-06-20 LAB — PSA: Prostatic Specific Antigen: 5.3 ng/mL — ABNORMAL HIGH (ref 0.00–4.00)

## 2017-06-27 NOTE — Progress Notes (Signed)
06/28/2017 10:06 AM   Tony Mitchell 06-Oct-1940 [id number]  Referring provider: Tracie Harrier, MD [address] [facility] [place], [place] [address]  Chief Complaint  Patient presents with  . Elevated PSA    HPI: Patient is a 76 year old Caucasian male with a history of elevated PSA, BPH with LU TS and ED who presents for a month follow up.    History of elevated PSA Referred for an elevated PSA. The patient states that this was noted on a routine annual physical exam.  The patient states that he has had PSA screening for many years. He had a elevated PSA 15 years ago which prompted a prostate biopsy. This was negative. He does not recall any specific numbers although he is been told that they've been normal. His never had an abnormal rectal exam. He has no family history of prostate cancer.  Current PSA is 5.30 on 06/20/2017.    BPH WITH LUTS  (prostate and/or bladder) His IPSS score today is 5, which is mild lower urinary tract symptomatology. He is pleased with his quality life due to his urinary symptoms.  His major complaints today are intermittent nocturia and intermittency.  He has had these symptoms over the last year.   He denies any dysuria, hematuria or suprapubic pain.   He also denies any recent fevers, chills, nausea or vomiting.   IPSS    Row Name 06/28/17 0900         International Prostate Symptom Score   How often have you had the sensation of not emptying your bladder?  Not at All     How often have you had to urinate less than every two hours?  Not at All     How often have you found you stopped and started again several times when you urinated?  Less than 1 in 5 times     How often have you found it difficult to postpone urination?  Less than 1 in 5 times     How often have you had a weak urinary stream?  Less than 1 in 5 times     How often have you had to strain to start urination?  Less than 1 in 5 times     How many times did you  typically get up at night to urinate?  1 Time     Total IPSS Score  5       Quality of Life due to urinary symptoms   If you were to spend the rest of your life with your urinary condition just the way it is now how would you feel about that?  Pleased        Score:  1-7 Mild 8-19 Moderate 20-35 Severe   Erectile dysfunction His SHIM score is 13, which is mild to moderate.   He has been having difficulty with erections for the last few years.   His major complaint is the firmness of the erections is hit or miss with sildenafil  His libido is preserved   His risk factors for ED are age, BPH, HTN, HLD, sleep apnea, CAD and antidepressants, pain medication.  He denies any painful erections or curvatures with his erections.   He is still having spontaneous erections.  He has tried sildenafil in the past with mixed results.   SHIM    Row Name 06/28/17 0946         SHIM: Over the last 6 months:   How  do you rate your confidence that you could get and keep an erection?  Low     When you had erections with sexual stimulation, how often were your erections hard enough for penetration (entering your partner)?  Sometimes (about half the time)     During sexual intercourse, how often were you able to maintain your erection after you had penetrated (entered) your partner?  Sometimes (about half the time)     During sexual intercourse, how difficult was it to maintain your erection to completion of intercourse?  Difficult     When you attempted sexual intercourse, how often was it satisfactory for you?  A Few Times (much less than half the time)       SHIM Total Score   SHIM  13        Score: 1-7 Severe ED 8-11 Moderate ED 12-16 Mild-Moderate ED 17-21 Mild ED 22-25 No ED  PMH: Past Medical History:  Diagnosis Date  . Coronary artery disease   . Gout   . Hyperlipidemia   . Hypertension   . Sleep apnea     Surgical History: Past Surgical History:  Procedure Laterality Date  .  CATARACT EXTRACTION  2012  . KNEE SURGERY Left 1965    Home Medications:  Allergies as of 06/28/2017   No Known Allergies     Medication List        Accurate as of 06/28/17 10:06 AM. Always use your most recent med list.          allopurinol 100 MG tablet Commonly known as:  ZYLOPRIM Take 100 mg by mouth daily.   allopurinol 100 MG tablet Commonly known as:  ZYLOPRIM TAKE 1 TABLET BY MOUTH EVERY DAY   ALPRAZolam 0.5 MG tablet Commonly known as:  XANAX Take 0.5 mg by mouth at bedtime as needed for anxiety.   aspirin EC 81 MG tablet Take 81 mg by mouth daily.   cloNIDine 0.3 MG tablet Commonly known as:  CATAPRES Take 0.3 mg by mouth 2 (two) times daily.   gabapentin 300 MG capsule Commonly known as:  NEURONTIN Take 300 mg by mouth 3 (three) times daily.   hydrALAZINE 50 MG tablet Commonly known as:  APRESOLINE Take 50 mg by mouth daily.   hydrochlorothiazide 25 MG tablet Commonly known as:  HYDRODIURIL Take 25 mg by mouth daily.   ibuprofen 800 MG tablet Commonly known as:  ADVIL,MOTRIN Take 800 mg by mouth 3 (three) times daily.   labetalol 300 MG tablet Commonly known as:  NORMODYNE Take 300 mg by mouth 2 (two) times daily.   labetalol 300 MG tablet Commonly known as:  NORMODYNE TAKE 1 TABLET BY MOUTH TWICE DAILY   lisinopril 40 MG tablet Commonly known as:  PRINIVIL,ZESTRIL TAKE 1 TABLET BY MOUTH EVERY DAY   sildenafil 20 MG tablet Commonly known as:  REVATIO Take 100 mg by mouth daily as needed (erectile dysfunction).   simvastatin 20 MG tablet Commonly known as:  ZOCOR Take by mouth.       Allergies: No Known Allergies  Family History: Family History  Problem Relation Age of Onset  . Cancer Father        Unknown  . Prostate cancer Neg Hx   . Lung cancer Neg Hx     Social History:  reports that he quit smoking about 21 years ago. His smoking use included cigarettes. he has never used smokeless tobacco. He reports that he drinks  alcohol. He reports that he  does not use drugs.  ROS: UROLOGY Frequent Urination?: No Hard to postpone urination?: No Burning/pain with urination?: No Get up at night to urinate?: Yes Leakage of urine?: No Urine stream starts and stops?: Yes Trouble starting stream?: No Do you have to strain to urinate?: No Blood in urine?: No Urinary tract infection?: No Sexually transmitted disease?: No Injury to kidneys or bladder?: No Painful intercourse?: No Weak stream?: No Erection problems?: Yes Penile pain?: No  Gastrointestinal Nausea?: No Vomiting?: No Indigestion/heartburn?: No Diarrhea?: No Constipation?: No  Constitutional Fever: No Night sweats?: No Weight loss?: No Fatigue?: No  Skin Skin rash/lesions?: No Itching?: No  Eyes Blurred vision?: No Double vision?: No  Ears/Nose/Throat Sore throat?: No Sinus problems?: No  Hematologic/Lymphatic Swollen glands?: No Easy bruising?: No  Cardiovascular Leg swelling?: No Chest pain?: No  Respiratory Cough?: No Shortness of breath?: No  Endocrine Excessive thirst?: No  Musculoskeletal Back pain?: No Joint pain?: Yes  Neurological Headaches?: No Dizziness?: No  Psychologic Depression?: No Anxiety?: No  Physical Exam: BP (!) 163/94   Pulse 76   Ht 5\' 9"  (1.753 m)   Wt 190 lb (86.2 kg)   BMI 28.06 kg/m   Constitutional: Well nourished. Alert and oriented, No acute distress. HEENT: [place] AT, moist mucus membranes. Trachea midline, no masses. Cardiovascular: No clubbing, cyanosis, or edema. Respiratory: Normal respiratory effort, no increased work of breathing. GI: Abdomen is soft, non tender, non distended, no abdominal masses. Liver and spleen not palpable.  No hernias appreciated.  Stool sample for occult testing is not indicated.   GU: No CVA tenderness.  No bladder fullness or masses.  Patient with circumcised phallus.  Urethral meatus is patent.  No penile discharge. No penile lesions or  rashes. Scrotum without lesions, cysts, rashes and/or edema.  Testicles are located scrotally bilaterally. No masses are appreciated in the testicles. Left and right epididymis are normal. Rectal: Patient with  normal sphincter tone. Anus and perineum without scarring or rashes. No rectal masses are appreciated. Prostate is approximately 45 grams, no nodules are appreciated. Seminal vesicles are normal. Skin: No rashes, bruises or suspicious lesions. Lymph: No cervical or inguinal adenopathy. Neurologic: Grossly intact, no focal deficits, moving all 4 extremities. Psychiatric: Normal mood and affect.  Laboratory Data: PSA History  8.2 ng/mL on April 10, 2016  5.7 ng/mL on June 20, 2016  6.6 ng/mL on Dec 20, 2016  5.30 ng/mL on June 20, 2017  I have reviewed the labs.  Assessment & Plan:    1. Elevated PSA  - I discussed with the patient that PSA is an acronym for  prostate specific antigen,  which is a protein made by the prostate gland and can be detected in the blood stream. I explained to the patient situations that would increase the PSA, such as: a man's age,  BPH, infection, recent intercourse/ejaculation, prostate infarction, recent urethroscopic manipulation (Foley placement/cystoscopy) and prostate cancer.   - We discussed that indications for prostate biopsy are defined by age and race specific PSA cutoffs as well as a PSA velocity of 0.75/year.  - As the PSA appears somewhat stable, we will continue to monitor q 6 months  2. BPH with LUTS  - IPSS score is 5/1  - Continue conservative management, avoiding bladder irritants and timed voiding's  - most bothersome symptoms is/are nocturia x 1 and intermittency  - RTC in 6 months for IPSS, PSA, PVR and exam   3. Erectile dysfunction  - SHIM score is 13  -  I explained to the patient that in order to achieve an erection it takes good functioning of the nervous system (parasympathetic and rs, sympathetic, sensory and motor),  good blood flow into the erectile tissue of the penis and a desire to have sex  - I explained that conditions like diabetes, hypertension, coronary artery disease, peripheral vascular disease, smoking, alcohol consumption, age, sleep apnea and BPH can diminish the ability to have an erection  - A recent study published in Sex Med 2018 Apr 13 revealed moderate to vigorous aerobic exercise for 40 minutes 4 times per week can decrease erectile problems caused by physical inactivity, obesity, hypertension, metabolic syndrome and/or cardiovascular diseases  - We discussed trying  intracavernous vasoactive drug injection therapy and vacuum constriction device   - RTC in 6 months for repeat SHIM score and exam    Return in about 6 months (around 12/26/2017) for IPSS, SHIM, PSA and exam.  These notes generated with voice recognition software. I apologize for typographical errors.  Zara Council, [facility] [address] [place], [place] [address] [phone]

## 2017-06-28 ENCOUNTER — Ambulatory Visit (INDEPENDENT_AMBULATORY_CARE_PROVIDER_SITE_OTHER): Payer: Medicare Other | Admitting: Urology

## 2017-06-28 ENCOUNTER — Encounter: Payer: Self-pay | Admitting: Urology

## 2017-06-28 VITALS — BP 163/94 | HR 76 | Ht 69.0 in | Wt 190.0 lb

## 2017-06-28 DIAGNOSIS — N138 Other obstructive and reflux uropathy: Secondary | ICD-10-CM

## 2017-06-28 DIAGNOSIS — N401 Enlarged prostate with lower urinary tract symptoms: Secondary | ICD-10-CM

## 2017-06-28 DIAGNOSIS — R972 Elevated prostate specific antigen [PSA]: Secondary | ICD-10-CM | POA: Diagnosis not present

## 2017-06-28 DIAGNOSIS — N529 Male erectile dysfunction, unspecified: Secondary | ICD-10-CM

## 2017-07-10 DIAGNOSIS — M1711 Unilateral primary osteoarthritis, right knee: Secondary | ICD-10-CM | POA: Insufficient documentation

## 2017-07-10 DIAGNOSIS — M5416 Radiculopathy, lumbar region: Secondary | ICD-10-CM

## 2017-07-10 DIAGNOSIS — M48061 Spinal stenosis, lumbar region without neurogenic claudication: Secondary | ICD-10-CM | POA: Insufficient documentation

## 2017-08-08 ENCOUNTER — Ambulatory Visit: Payer: Medicare Other

## 2017-08-08 ENCOUNTER — Ambulatory Visit (INDEPENDENT_AMBULATORY_CARE_PROVIDER_SITE_OTHER)
Admission: EM | Admit: 2017-08-08 | Discharge: 2017-08-08 | Disposition: A | Discharge status: Home / Self Care | Payer: Medicare Other | Source: Home / Self Care | Attending: Family Medicine | Admitting: Family Medicine

## 2017-08-08 ENCOUNTER — Encounter: Payer: Self-pay | Admitting: *Deleted

## 2017-08-08 ENCOUNTER — Other Ambulatory Visit: Payer: Self-pay

## 2017-08-08 ENCOUNTER — Emergency Department: Payer: Medicare Other

## 2017-08-08 ENCOUNTER — Emergency Department
Admission: EM | Admit: 2017-08-08 | Discharge: 2017-08-08 | Disposition: A | Discharge status: Home / Self Care | Payer: Medicare Other | Attending: Emergency Medicine | Admitting: Emergency Medicine

## 2017-08-08 DIAGNOSIS — R0602 Shortness of breath: Secondary | ICD-10-CM | POA: Diagnosis present

## 2017-08-08 DIAGNOSIS — I700 Atherosclerosis of aorta: Secondary | ICD-10-CM | POA: Insufficient documentation

## 2017-08-08 DIAGNOSIS — R0789 Other chest pain: Secondary | ICD-10-CM | POA: Insufficient documentation

## 2017-08-08 DIAGNOSIS — Z87891 Personal history of nicotine dependence: Secondary | ICD-10-CM

## 2017-08-08 DIAGNOSIS — I10 Essential (primary) hypertension: Secondary | ICD-10-CM

## 2017-08-08 DIAGNOSIS — R42 Dizziness and giddiness: Secondary | ICD-10-CM | POA: Insufficient documentation

## 2017-08-08 DIAGNOSIS — Z791 Long term (current) use of non-steroidal anti-inflammatories (NSAID): Secondary | ICD-10-CM

## 2017-08-08 DIAGNOSIS — G4733 Obstructive sleep apnea (adult) (pediatric): Secondary | ICD-10-CM | POA: Insufficient documentation

## 2017-08-08 DIAGNOSIS — R0609 Other forms of dyspnea: Secondary | ICD-10-CM

## 2017-08-08 DIAGNOSIS — R0600 Dyspnea, unspecified: Secondary | ICD-10-CM

## 2017-08-08 DIAGNOSIS — R079 Chest pain, unspecified: Secondary | ICD-10-CM | POA: Diagnosis not present

## 2017-08-08 DIAGNOSIS — Z7982 Long term (current) use of aspirin: Secondary | ICD-10-CM

## 2017-08-08 DIAGNOSIS — R0902 Hypoxemia: Secondary | ICD-10-CM | POA: Diagnosis not present

## 2017-08-08 DIAGNOSIS — Z809 Family history of malignant neoplasm, unspecified: Secondary | ICD-10-CM | POA: Insufficient documentation

## 2017-08-08 DIAGNOSIS — K219 Gastro-esophageal reflux disease without esophagitis: Secondary | ICD-10-CM

## 2017-08-08 DIAGNOSIS — I2510 Atherosclerotic heart disease of native coronary artery without angina pectoris: Secondary | ICD-10-CM

## 2017-08-08 DIAGNOSIS — Z9889 Other specified postprocedural states: Secondary | ICD-10-CM

## 2017-08-08 DIAGNOSIS — Z79899 Other long term (current) drug therapy: Secondary | ICD-10-CM | POA: Diagnosis not present

## 2017-08-08 DIAGNOSIS — R7981 Abnormal blood-gas level: Secondary | ICD-10-CM

## 2017-08-08 DIAGNOSIS — J439 Emphysema, unspecified: Secondary | ICD-10-CM

## 2017-08-08 DIAGNOSIS — M109 Gout, unspecified: Secondary | ICD-10-CM | POA: Insufficient documentation

## 2017-08-08 DIAGNOSIS — R0989 Other specified symptoms and signs involving the circulatory and respiratory systems: Secondary | ICD-10-CM

## 2017-08-08 DIAGNOSIS — E785 Hyperlipidemia, unspecified: Secondary | ICD-10-CM | POA: Insufficient documentation

## 2017-08-08 DIAGNOSIS — R21 Rash and other nonspecific skin eruption: Secondary | ICD-10-CM | POA: Insufficient documentation

## 2017-08-08 DIAGNOSIS — R0689 Other abnormalities of breathing: Secondary | ICD-10-CM

## 2017-08-08 LAB — CBC WITH DIFFERENTIAL/PLATELET
BASOS ABS: 0.1 10*3/uL (ref 0–0.1)
Basophils Relative: 1 %
EOS PCT: 1 %
Eosinophils Absolute: 0 10*3/uL (ref 0–0.7)
HEMATOCRIT: 41 % (ref 40.0–52.0)
Hemoglobin: 13.8 g/dL (ref 13.0–18.0)
LYMPHS ABS: 1.9 10*3/uL (ref 1.0–3.6)
LYMPHS PCT: 29 %
MCH: 31.2 pg (ref 26.0–34.0)
MCHC: 33.5 g/dL (ref 32.0–36.0)
MCV: 93.1 fL (ref 80.0–100.0)
MONO ABS: 0.5 10*3/uL (ref 0.2–1.0)
Monocytes Relative: 7 %
NEUTROS ABS: 4.3 10*3/uL (ref 1.4–6.5)
Neutrophils Relative %: 62 %
PLATELETS: 185 10*3/uL (ref 150–440)
RBC: 4.41 MIL/uL (ref 4.40–5.90)
RDW: 12.9 % (ref 11.5–14.5)
WBC: 6.8 10*3/uL (ref 3.8–10.6)

## 2017-08-08 LAB — TROPONIN I: Troponin I: <0.03 ng/mL (ref <0.03)

## 2017-08-08 LAB — BASIC METABOLIC PANEL
ANION GAP: 11 (ref 5–15)
BUN: 20 mg/dL (ref 6–20)
CO2: 24 mmol/L (ref 22–32)
Calcium: 8.7 mg/dL — ABNORMAL LOW (ref 8.9–10.3)
Chloride: 103 mmol/L (ref 101–111)
Creatinine, Ser: 1.16 mg/dL (ref 0.61–1.24)
GFR calc Af Amer: >60 mL/min (ref >60)
GFR calc non Af Amer: 59 mL/min — ABNORMAL LOW (ref >60)
GLUCOSE: 135 mg/dL — AB (ref 65–99)
POTASSIUM: 4.2 mmol/L (ref 3.5–5.1)
Sodium: 138 mmol/L (ref 135–145)

## 2017-08-08 LAB — FIBRIN DERIVATIVES D-DIMER (ARMC ONLY): FIBRIN DERIVATIVES D-DIMER (ARMC): 475.57 ng{FEU}/mL (ref 0.00–499.00)

## 2017-08-08 LAB — BRAIN NATRIURETIC PEPTIDE: B Natriuretic Peptide: 218 pg/mL — ABNORMAL HIGH (ref 0.0–100.0)

## 2017-08-08 MED ORDER — PREDNISONE 20 MG PO TABS: 60.0000 mg | ORAL_TABLET | Freq: Every day | ORAL | 0 refills | Status: DC

## 2017-08-08 MED ORDER — IOPAMIDOL (ISOVUE-370) INJECTION 76%
75.0000 mL | Freq: Once | INTRAVENOUS | Status: AC | PRN
  Administered 2017-08-08: 75 mL via INTRAVENOUS

## 2017-08-08 MED ORDER — ALBUTEROL SULFATE HFA 108 (90 BASE) MCG/ACT IN AERS: 2.0000 | INHALATION_SPRAY | Freq: Four times a day (QID) | RESPIRATORY_TRACT | 2 refills | Status: DC | PRN

## 2017-08-08 MED ORDER — IPRATROPIUM-ALBUTEROL 0.5-2.5 (3) MG/3ML IN SOLN
3.0000 mL | Freq: Once | RESPIRATORY_TRACT | Status: AC
  Administered 2017-08-08: 3 mL via RESPIRATORY_TRACT
  Filled 2017-08-08: qty 3

## 2017-08-08 MED ORDER — CLONIDINE HCL 0.1 MG PO TABS
0.1000 mg | ORAL_TABLET | Freq: Every day | ORAL | Status: DC
Start: 2017-08-08 — End: 2017-08-08
  Administered 2017-08-08: 0.1 mg via ORAL

## 2017-08-08 MED ORDER — PREDNISONE 20 MG PO TABS
60.0000 mg | ORAL_TABLET | Freq: Once | ORAL | Status: AC
  Administered 2017-08-08: 60 mg via ORAL
  Filled 2017-08-08: qty 3

## 2017-08-08 NOTE — ED Notes (Signed)
ED Provider at bedside. 

## 2017-08-08 NOTE — ED Notes (Signed)
Treatment complete.  Pt on cell phone.

## 2017-08-08 NOTE — ED Notes (Addendum)
Pt sent from [facility] urgent care.  Pt reports sob with exertion.  No chest pain.  No cough.  No fever. Nonsmoker.  Pt also reports he has been dizzy today. No headache.  States it feels like my equalibrium is off.    Pt alert.   Speech clear.

## 2017-08-08 NOTE — ED Provider Notes (Addendum)
[facility] Emergency Department Provider Note ____________________________________________   First MD Initiated Contact with Patient 08/08/17 1527     (approximate)  I have reviewed the triage vital signs and the nursing notes.   HISTORY  Chief Complaint Shortness of Breath    HPI Tony Mitchell is a 76 y.o. male with past medical history as noted below who presents with shortness of breath, acute onset this morning when he walks to pick up the newspaper out front, associated with exertion and relieved with rest, and associated with hypoxia.  Patient states that he used his wife's O2 monitor to check his oxygen and it was 83% when he walked, and then returned to the high 90s when he had been at rest for approximately 10 minutes.  Patient reports some discomfort in his lower sternum but no significant chest pain.  He also denies any acute leg pain or swelling.  No cough or fever.  Patient states that he felt this way once before 1 year ago, and when he went to the emergency department he was found to have a collapsed lung.  He said the difference that time was that the low oxygen level never rose when he was at rest.  Patient also reports feeling slightly dizzy and with a clicking sound in his head when he swallows after he exerted himself.  Past Medical History:  Diagnosis Date  . Coronary artery disease   . Gout   . Hyperlipidemia   . Hypertension   . Sleep apnea     Patient Active Problem List   Diagnosis Date Noted  . GERD (gastroesophageal reflux disease) 06/13/2016  . Gout 06/13/2016  . Hyperlipidemia, unspecified 06/13/2016  . Hypertension 06/13/2016  . Pneumothorax on right 06/03/2016  . Elevated PSA 03/29/2016  . Elevated blood sugar 09/29/2015  . Rash 02/17/2015  . OSA (obstructive sleep apnea) 07/08/2014  . S/P cardiac catheterization 05/28/2014  . SOB (shortness of breath) on exertion 05/28/2014    Past Surgical History:    Procedure Laterality Date  . CATARACT EXTRACTION  2012  . KNEE SURGERY Left 1965    Prior to Admission medications   Medication Sig Start Date End Date Taking? Authorizing Provider  albuterol (PROVENTIL HFA;VENTOLIN HFA) 108 (90 Base) MCG/ACT inhaler Inhale 2 puffs into the lungs every 6 (six) hours as needed for wheezing or shortness of breath. 08/08/17   Arta Silence, MD  allopurinol (ZYLOPRIM) 100 MG tablet TAKE 1 TABLET BY MOUTH EVERY DAY 06/17/17   [provider]  ALPRAZolam Duanne Moron) 0.5 MG tablet Take 0.5 mg by mouth at bedtime as needed for anxiety.    [provider]  aspirin EC 81 MG tablet Take 81 mg by mouth daily.    [provider]  cloNIDine (CATAPRES) 0.3 MG tablet Take 0.3 mg by mouth 2 (two) times daily.     [provider]  cyanocobalamin 1000 MCG tablet Take 1,000 mcg by mouth daily.    [provider]  gabapentin (NEURONTIN) 300 MG capsule Take 300 mg by mouth 3 (three) times daily.    [provider]  hydrALAZINE (APRESOLINE) 50 MG tablet Take 50 mg by mouth daily.     [provider]  hydrochlorothiazide (HYDRODIURIL) 25 MG tablet Take 25 mg by mouth daily. 05/14/16   [provider]  ibuprofen (ADVIL,MOTRIN) 800 MG tablet Take 800 mg by mouth 3 (three) times daily.    [provider]  labetalol (NORMODYNE) 300 MG tablet Take  300 mg by mouth 2 (two) times daily.    [provider]  lansoprazole (PREVACID) 15 MG capsule Take 15 mg by mouth daily at 12 noon.    [provider]  lisinopril (PRINIVIL,ZESTRIL) 40 MG tablet TAKE 1 TABLET BY MOUTH EVERY DAY 06/18/16   [provider]  predniSONE (DELTASONE) 20 MG tablet Take 3 tablets (60 mg total) by mouth daily with breakfast. 08/09/17   Arta Silence, MD  sildenafil (REVATIO) 20 MG tablet Take 100 mg by mouth daily as needed (erectile dysfunction).     [provider]  simvastatin (ZOCOR) 20 MG  tablet Take by mouth. 04/04/17 04/04/18  [provider]    Allergies Patient has no known allergies.  Family History  Problem Relation Age of Onset  . Cancer Father        Unknown  . Prostate cancer Neg Hx   . Lung cancer Neg Hx     Social History Social History   Tobacco Use  . Smoking status: Former Smoker    Types: Cigarettes    Last attempt to quit: 06/15/1996    Years since quitting: 21.1  . Smokeless tobacco: Never Used  Substance Use Topics  . Alcohol use: Yes    Comment: 1 mixed drink daily  . Drug use: No    Review of Systems  Constitutional: No fever/chills. Eyes: No visual changes. ENT: No sore throat. Cardiovascular: Denies chest pain. Respiratory: Positive for shortness of breath. Gastrointestinal: No nausea, no vomiting.  No diarrhea.  Genitourinary: Negative for dysuria.  Musculoskeletal: Negative for back pain. Skin: Negative for rash. Neurological: Negative for headache.     ____________________________________________   PHYSICAL EXAM:  VITAL SIGNS: ED Triage Vitals  Enc Vitals Group     BP 08/08/17 1319 (!) 184/86     Pulse Rate 08/08/17 1319 71     Resp 08/08/17 1319 18     Temp 08/08/17 1319 98.1 F (36.7 C)     Temp Source 08/08/17 1319 Oral     SpO2 08/08/17 1319 96 %     Weight 08/08/17 1320 195 lb (88.5 kg)     Height 08/08/17 1320 5\' 9"  (1.753 m)     Head Circumference --      Peak Flow --      Pain Score 08/08/17 1319 0     Pain Loc --      Pain Edu? --      Excl. in [place]? --     Constitutional: Alert and oriented. Well appearing and in no acute distress. Eyes: Conjunctivae are normal.  Head: Atraumatic. Nose: No congestion/rhinnorhea. Mouth/Throat: Mucous membranes are moist.   Neck: Normal range of motion.  Cardiovascular: Normal rate, regular rhythm. Grossly normal heart sounds.  Good peripheral circulation. Respiratory: Normal respiratory effort.  No retractions. Slight decreased BS bilat but lungs  CTAB. Gastrointestinal:No distention.  Genitourinary: No CVA tenderness. Musculoskeletal: Trace bilateral lower extremity edema.  Extremities warm and well perfused.  Neurologic:  Normal speech and language. No gross focal neurologic deficits are appreciated.  Skin:  Skin is warm and dry. No rash noted. Psychiatric: Mood and affect are normal. Speech and behavior are normal.  ____________________________________________   LABS (all labs ordered are listed, but only abnormal results are displayed)  Labs Reviewed  BRAIN NATRIURETIC PEPTIDE - Abnormal; Notable for the following components:      Result Value   B Natriuretic Peptide 218.0 (*)    All other components within normal limits  TROPONIN I  FIBRIN DERIVATIVES D-DIMER ([facility] ONLY)   ____________________________________________  EKG   ____________________________________________  RADIOLOGY    ____________________________________________   PROCEDURES  Procedure(s) performed: No    Critical Care performed: No ____________________________________________   INITIAL IMPRESSION / ASSESSMENT AND PLAN / ED COURSE  Pertinent labs & imaging results that were available during my care of the patient were reviewed by me and considered in my medical decision making (see chart for details).  76 year old male with past medical history as noted above presents with acute onset of shortness of breath this morning associated with hypoxia when he exerts himself, and resolved with rest.  Minimal associated chest discomfort, and no leg pain or significant leg swelling.  Patient was seen in urgent care, and had basic labs and EKG as well as a chest x-ray performed.  I reviewed these records in Epic; the labs, EKG, and x-rays were within normal limits.  On exam, patient is comfortable appearing, he is hypertensive but other vital signs are normal.  His O2 sat is in the high 90s at rest.  The remainder the exam is unremarkable except for  trace bilateral lower extremity edema with no tenderness.  Differential includes ACS, mild CHF or COPD, or less likely PE, given the hypoxia with normal x-ray.  Patient has no specific PE risk factors, and no current signs or symptoms of DVT.  Plan: add troponin, BNP, and d-dimer as patient is still overall low risk for PE.  Also will give trial of neb and reassess.     ----------------------------------------- 5:04 PM on 08/08/2017 -----------------------------------------  BNP, troponin, and d-dimer are all negative.  When I walked to the patient around one side of the emergency department, he did not have significant dyspnea on exertion, however when he sat back down in a stretcher his O2 sat dropped to 86% for a few moments before returning back to the high 90s.  Given the lab results, I suspect most likely mild COPD, however since patient was still significantly hypoxic after exertion I will obtain a CT to definitively rule out PE, and it may show other findings that could be diagnostic.  Patient agrees with the plan.   ----------------------------------------- 6:33 PM on 08/08/2017 -----------------------------------------  Patient's shortness of breath has not recurred, and he continues to have normal O2 sat at rest.  He has no more exertional shortness of breath.  CT is negative.  Based on the negative workup, I will treat for likely mild COPD versus acute bronchitis.  We will give steroid course as well as inhaler prescription, and patient was instructed to follow-up with his primary care doctor within the next week for reassessment.  Return precautions given, and patient expresses understanding.  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  There is no clinical evidence this is related to patient's current symptoms. ____________________________________________   FINAL CLINICAL IMPRESSION(S) / ED DIAGNOSES  Final  diagnoses:  SOB (shortness of breath)      NEW MEDICATIONS STARTED DURING THIS VISIT:  This SmartLink is deprecated. Use AVSMEDLIST instead to display the medication list for a patient.   Note:  This document was prepared using Dragon voice recognition software and may include unintentional dictation errors.      Arta Silence, MD 08/08/17 [phone]

## 2017-08-08 NOTE — ED Notes (Signed)
Per Dr. Corky Downs no orders at this time. Pt had chest xray, EKG, and basic blood work performed at [facility].

## 2017-08-08 NOTE — Discharge Instructions (Signed)
Your shortness of breath is likely due either to bronchitis, or it could also be from mild COPD or emphysema.  Take the prednisone as prescribed starting tomorrow, and use the inhaler every 4-6 hours as needed for shortness of breath.  Make an appointment to follow-up with your doctor within the next 1-2 weeks.  Return to the emergency department immediately if you have new or worsening difficulty breathing, persistent difficulty breathing or low oxygen readings below 90%, chest pain, lightheadedness or dizziness, leg swelling or pain in one leg, or any other new or worsening symptoms that concern you.

## 2017-08-08 NOTE — ED Notes (Signed)
Treatment complete.  Pt alert. Tony Mitchell on monitor.

## 2017-08-08 NOTE — Discharge Instructions (Signed)
Discussed with patient recommendation to go to Emergency Department for further evaluation and management

## 2017-08-08 NOTE — ED Triage Notes (Signed)
Pt arrives from [place] with c/o of SOB with exertion. Pt states he woke up this AM and felt fine but started walking and felt SOB. States wife has COPD so they have pulse ox at home. Checked it after walking and was 83% RA. Pt states sat down for 15 minutes and checked again and it was 97%. Pt states BP at home was 140's/80's. Hx HTN, took meds this AM. No resp distress noted while sitting. Pt is talking in complete sentences.

## 2017-08-08 NOTE — ED Triage Notes (Signed)
PAtient started having mid sternal chest pain that radiates to the back and dizziness this AM. No previous history of MI. Patient also reports ear fulllness.

## 2017-08-08 NOTE — ED Provider Notes (Signed)
[facility]    CSN: [id number] Arrival date & time: 08/08/17  1043     History   Chief Complaint Chief Complaint  Patient presents with  . Dizziness  . Chest Pain    HPI Tony Mitchell is a 76 y.o. male.   76 yo male with a c/o shortness of breath with activity since this morning. States he walked out to get the newspaper this morning and felt short of breath and dizzy. Also since since morning has felt chest discomfort but only with change in positions. Did not feel chest pain or pressure with walking or at rest. No prior history of shortness of breath with exertion. No prior history of requiring oxygen at home. Denies any fevers, chills, cough, wheezing, lower extremity edema.    The history is provided by the patient.  Dizziness  Associated symptoms: chest pain   Chest Pain  Associated symptoms: dizziness     Past Medical History:  Diagnosis Date  . Coronary artery disease   . Gout   . Hyperlipidemia   . Hypertension   . Sleep apnea     Patient Active Problem List   Diagnosis Date Noted  . GERD (gastroesophageal reflux disease) 06/13/2016  . Gout 06/13/2016  . Hyperlipidemia, unspecified 06/13/2016  . Hypertension 06/13/2016  . Pneumothorax on right 06/03/2016  . Elevated PSA 03/29/2016  . Elevated blood sugar 09/29/2015  . Rash 02/17/2015  . OSA (obstructive sleep apnea) 07/08/2014  . S/P cardiac catheterization 05/28/2014  . SOB (shortness of breath) on exertion 05/28/2014    Past Surgical History:  Procedure Laterality Date  . CATARACT EXTRACTION  2012  . KNEE SURGERY Left 1965       Home Medications    Prior to Admission medications   Medication Sig Start Date End Date Taking? Authorizing Provider  allopurinol (ZYLOPRIM) 100 MG tablet TAKE 1 TABLET BY MOUTH EVERY DAY 06/17/17  Yes [provider]  ALPRAZolam (XANAX) 0.5 MG tablet Take 0.5 mg by mouth at bedtime as needed for anxiety.   Yes [provider]    aspirin EC 81 MG tablet Take 81 mg by mouth daily.   Yes [provider]  cloNIDine (CATAPRES) 0.3 MG tablet Take 0.3 mg by mouth 2 (two) times daily.    Yes [provider]  cyanocobalamin 1000 MCG tablet Take 1,000 mcg by mouth daily.   Yes [provider]  gabapentin (NEURONTIN) 300 MG capsule Take 300 mg by mouth 3 (three) times daily.   Yes [provider]  hydrALAZINE (APRESOLINE) 50 MG tablet Take 50 mg by mouth daily.    Yes [provider]  hydrochlorothiazide (HYDRODIURIL) 25 MG tablet Take 25 mg by mouth daily. 05/14/16  Yes [provider]  ibuprofen (ADVIL,MOTRIN) 800 MG tablet Take 800 mg by mouth 3 (three) times daily.   Yes [provider]  labetalol (NORMODYNE) 300 MG tablet Take 300 mg by mouth 2 (two) times daily.   Yes [provider]  lansoprazole (PREVACID) 15 MG capsule Take 15 mg by mouth daily at 12 noon.   Yes [provider]  lisinopril (PRINIVIL,ZESTRIL) 40 MG tablet TAKE 1 TABLET BY MOUTH EVERY DAY 06/18/16  Yes [provider]  sildenafil (REVATIO) 20 MG tablet Take 100 mg by mouth daily as needed (erectile dysfunction).    Yes [provider]  simvastatin (ZOCOR) 20 MG tablet Take by mouth. 04/04/17 04/04/18 Yes [provider]    Family History Family  History  Problem Relation Age of Onset  . Cancer Father        Unknown  . Prostate cancer Neg Hx   . Lung cancer Neg Hx     Social History Social History   Tobacco Use  . Smoking status: Former Smoker    Types: Cigarettes    Last attempt to quit: 06/15/1996    Years since quitting: 21.1  . Smokeless tobacco: Never Used  Substance Use Topics  . Alcohol use: Yes    Comment: 1 mixed drink daily  . Drug use: No     Allergies   Patient has no known allergies.   Review of Systems Review of Systems  Cardiovascular: Positive for chest pain.  Neurological: Positive for dizziness.     Physical  Exam Triage Vital Signs ED Triage Vitals  Enc Vitals Group     BP 08/08/17 1051 (!) 201/106     Pulse Rate 08/08/17 1051 70     Resp 08/08/17 1051 16     Temp 08/08/17 1051 97.7 F (36.5 C)     Temp Source 08/08/17 1051 Oral     SpO2 08/08/17 1051 99 %     Weight 08/08/17 1052 195 lb (88.5 kg)     Height 08/08/17 1052 5\' 9"  (1.753 m)     Head Circumference --      Peak Flow --      Pain Score 08/08/17 1052 0     Pain Loc --      Pain Edu? --      Excl. in [place]? --    No data found.  Updated Vital Signs BP (!) 170/92 (BP Location: Right Arm)   Pulse 70   Temp 97.7 F (36.5 C) (Oral)   Resp 16   Ht 5\' 9"  (1.753 m)   Wt 195 lb (88.5 kg)   SpO2 (!) 86%   BMI 28.80 kg/m   Visual Acuity Right Eye Distance:   Left Eye Distance:   Bilateral Distance:    Right Eye Near:   Left Eye Near:    Bilateral Near:     Physical Exam  Constitutional: He is oriented to person, place, and time. He appears well-developed and well-nourished. No distress.  HENT:  Head: Normocephalic and atraumatic.  Cardiovascular: Normal rate, regular rhythm, normal heart sounds and intact distal pulses.  No murmur heard. Pulmonary/Chest: Effort normal and breath sounds normal. No stridor. No respiratory distress. He has no wheezes. He has no rales. He exhibits no tenderness.  Abdominal: Soft. Bowel sounds are normal. He exhibits no distension and no mass. There is no tenderness. There is no rebound and no guarding.  Neurological: He is alert and oriented to person, place, and time.  Skin: No rash noted. He is not diaphoretic.  Nursing note and vitals reviewed.    UC Treatments / Results  Labs (all labs ordered are listed, but only abnormal results are displayed) Labs Reviewed  BASIC METABOLIC PANEL - Abnormal; Notable for the following components:      Result Value   Glucose, Bld 135 (*)    Calcium 8.7 (*)    GFR calc non Af Amer 59 (*)    All other components within normal limits  CBC WITH  DIFFERENTIAL/PLATELET    EKG  EKG Interpretation None       Radiology Dg Chest 2 View  Result Date: 08/08/2017 CLINICAL DATA:  76 year old male with history of midsternal chest pain radiating to the back. Dizziness. Shortness  of breath. Prior history of myocardial infarction. EXAM: CHEST  2 VIEW COMPARISON:  Chest x-ray 06/15/2016. FINDINGS: Lungs are mildly hyperexpanded with mild emphysematous changes. No acute consolidative airspace disease. No pleural effusions. No definite suspicious appearing pulmonary nodules or masses. No evidence of pulmonary edema. Heart size is normal. Upper mediastinal contours are within normal limits. Aortic atherosclerosis. IMPRESSION: 1. No radiographic evidence of acute cardiopulmonary disease. 2. Emphysema. 3. Aortic atherosclerosis. Electronically Signed   By: Vinnie Langton M.D.   On: 08/08/2017 11:39    Procedures ED EKG Date/Time: 08/08/2017 11:34 AM Performed by: Norval Gable, MD Authorized by: Norval Gable, MD   ECG reviewed by ED Physician in the absence of a cardiologist: yes   Previous ECG:    Previous ECG:  Compared to current   Comparison ECG info:  Occasional PVCs, otherwise no change   Similarity:  Changes noted Interpretation:    Interpretation: abnormal   Rate:    ECG rate:  62   ECG rate assessment: normal   Rhythm:    Rhythm: sinus rhythm   Ectopy:    Ectopy: PVCs     PVCs:  Infrequent QRS:    QRS axis:  Normal Conduction:    Conduction: normal   ST segments:    ST segments:  Normal T waves:    T waves: normal      (including critical care time)  Medications Ordered in UC Medications  cloNIDine (CATAPRES) tablet 0.1 mg (0.1 mg Oral Given 08/08/17 1141)     Initial Impression / Assessment and Plan / UC Course  I have reviewed the triage vital signs and the nursing notes.  Pertinent labs & imaging results that were available during my care of the patient were reviewed by me and considered in my medical  decision making (see chart for details).    O2 sat (at rest)= 99% O2 sat (with ambulation)=86%   Final Clinical Impressions(s) / UC Diagnoses   Final diagnoses:  Dyspnea on minimal exertion  Abnormal pulse oximetry  Impaired oxygenation    ED Discharge Orders    None     1. Labs/x-ray results and diagnosis reviewed with patient 2. Due to normal chest x-ray and normal labs but hypoxia with exertion, recommend patient go to Emergency Department for further evaluation and management    Controlled Substance Prescriptions [place] Controlled Substance Registry consulted? Not Applicable   Norval Gable, MD 08/08/17 1229

## 2017-08-16 DIAGNOSIS — I712 Thoracic aortic aneurysm, without rupture, unspecified: Secondary | ICD-10-CM | POA: Insufficient documentation

## 2017-08-20 DIAGNOSIS — J9819 Other pulmonary collapse: Secondary | ICD-10-CM

## 2017-08-20 HISTORY — DX: Other pulmonary collapse: J98.19

## 2017-09-18 ENCOUNTER — Emergency Department: Payer: Medicare Other

## 2017-09-18 ENCOUNTER — Inpatient Hospital Stay
Admission: EM | Admit: 2017-09-18 | Discharge: 2017-09-21 | DRG: 201 | Disposition: A | Discharge status: Home / Self Care | Payer: Medicare Other | Attending: Surgery | Admitting: Surgery

## 2017-09-18 ENCOUNTER — Other Ambulatory Visit: Payer: Self-pay

## 2017-09-18 ENCOUNTER — Encounter: Payer: Self-pay | Admitting: Intensive Care

## 2017-09-18 DIAGNOSIS — J939 Pneumothorax, unspecified: Secondary | ICD-10-CM

## 2017-09-18 DIAGNOSIS — I10 Essential (primary) hypertension: Secondary | ICD-10-CM | POA: Diagnosis present

## 2017-09-18 DIAGNOSIS — E663 Overweight: Secondary | ICD-10-CM | POA: Diagnosis present

## 2017-09-18 DIAGNOSIS — Z6827 Body mass index (BMI) 27.0-27.9, adult: Secondary | ICD-10-CM

## 2017-09-18 DIAGNOSIS — I2510 Atherosclerotic heart disease of native coronary artery without angina pectoris: Secondary | ICD-10-CM | POA: Diagnosis present

## 2017-09-18 DIAGNOSIS — J9383 Other pneumothorax: Secondary | ICD-10-CM | POA: Diagnosis present

## 2017-09-18 DIAGNOSIS — Z87891 Personal history of nicotine dependence: Secondary | ICD-10-CM | POA: Diagnosis not present

## 2017-09-18 DIAGNOSIS — G4730 Sleep apnea, unspecified: Secondary | ICD-10-CM | POA: Diagnosis present

## 2017-09-18 DIAGNOSIS — E785 Hyperlipidemia, unspecified: Secondary | ICD-10-CM | POA: Diagnosis present

## 2017-09-18 DIAGNOSIS — J449 Chronic obstructive pulmonary disease, unspecified: Secondary | ICD-10-CM | POA: Diagnosis present

## 2017-09-18 DIAGNOSIS — Z7952 Long term (current) use of systemic steroids: Secondary | ICD-10-CM | POA: Diagnosis not present

## 2017-09-18 DIAGNOSIS — Z79899 Other long term (current) drug therapy: Secondary | ICD-10-CM | POA: Diagnosis not present

## 2017-09-18 DIAGNOSIS — Z9689 Presence of other specified functional implants: Secondary | ICD-10-CM

## 2017-09-18 DIAGNOSIS — J9311 Primary spontaneous pneumothorax: Secondary | ICD-10-CM | POA: Diagnosis not present

## 2017-09-18 DIAGNOSIS — Z7982 Long term (current) use of aspirin: Secondary | ICD-10-CM

## 2017-09-18 HISTORY — DX: Chronic obstructive pulmonary disease, unspecified: J44.9

## 2017-09-18 LAB — BASIC METABOLIC PANEL
Anion gap: 10 (ref 5–15)
BUN: 29 mg/dL — ABNORMAL HIGH (ref 6–20)
CALCIUM: 8.8 mg/dL — AB (ref 8.9–10.3)
CO2: 22 mmol/L (ref 22–32)
CREATININE: 1.23 mg/dL (ref 0.61–1.24)
Chloride: 105 mmol/L (ref 101–111)
GFR calc non Af Amer: 55 mL/min — ABNORMAL LOW (ref >60)
GLUCOSE: 141 mg/dL — AB (ref 65–99)
Potassium: 4.6 mmol/L (ref 3.5–5.1)
Sodium: 137 mmol/L (ref 135–145)

## 2017-09-18 LAB — CBC WITH DIFFERENTIAL/PLATELET
Basophils Absolute: 0 10*3/uL (ref 0–0.1)
Basophils Relative: 1 %
Eosinophils Absolute: 0.1 10*3/uL (ref 0–0.7)
Eosinophils Relative: 1 %
HCT: 41.3 % (ref 40.0–52.0)
Hemoglobin: 13.9 g/dL (ref 13.0–18.0)
Lymphocytes Relative: 25 %
Lymphs Abs: 1.7 10*3/uL (ref 1.0–3.6)
MCH: 31 pg (ref 26.0–34.0)
MCHC: 33.6 g/dL (ref 32.0–36.0)
MCV: 92.4 fL (ref 80.0–100.0)
MONO ABS: 0.5 10*3/uL (ref 0.2–1.0)
MONOS PCT: 7 %
NEUTROS ABS: 4.7 10*3/uL (ref 1.4–6.5)
Neutrophils Relative %: 66 %
Platelets: 196 10*3/uL (ref 150–440)
RBC: 4.47 MIL/uL (ref 4.40–5.90)
RDW: 13.8 % (ref 11.5–14.5)
WBC: 7 10*3/uL (ref 3.8–10.6)

## 2017-09-18 LAB — TROPONIN I

## 2017-09-18 LAB — TYPE AND SCREEN
ABO/RH(D): O POS
ANTIBODY SCREEN: NEGATIVE

## 2017-09-18 LAB — APTT: aPTT: 25 seconds (ref 24–36)

## 2017-09-18 LAB — PROTIME-INR
INR: 0.96
Prothrombin Time: 12.7 seconds (ref 11.4–15.2)

## 2017-09-18 MED ORDER — ALPRAZOLAM 0.5 MG PO TABS: 0.5000 mg | ORAL_TABLET | Freq: Every evening | ORAL | Status: DC | PRN

## 2017-09-18 MED ORDER — LIDOCAINE HCL (PF) 1 % IJ SOLN
INTRAMUSCULAR | Status: AC
  Administered 2017-09-18: 15:00:00
  Filled 2017-09-18: qty 5

## 2017-09-18 MED ORDER — CLONIDINE HCL 0.1 MG PO TABS
0.3000 mg | ORAL_TABLET | Freq: Two times a day (BID) | ORAL | Status: DC
  Administered 2017-09-18 – 2017-09-21 (×6): 0.3 mg via ORAL
  Filled 2017-09-18 (×6): qty 3

## 2017-09-18 MED ORDER — ALBUTEROL SULFATE (2.5 MG/3ML) 0.083% IN NEBU: 2.5000 mg | INHALATION_SOLUTION | Freq: Four times a day (QID) | RESPIRATORY_TRACT | Status: DC | PRN

## 2017-09-18 MED ORDER — ACETAMINOPHEN 500 MG PO TABS
1000.0000 mg | ORAL_TABLET | Freq: Four times a day (QID) | ORAL | Status: DC
  Administered 2017-09-18 – 2017-09-21 (×8): 1000 mg via ORAL
  Filled 2017-09-18 (×10): qty 2

## 2017-09-18 MED ORDER — OXYCODONE HCL 5 MG PO TABS: 5.0000 mg | ORAL_TABLET | ORAL | Status: DC | PRN

## 2017-09-18 MED ORDER — SIMVASTATIN 20 MG PO TABS
20.0000 mg | ORAL_TABLET | Freq: Every day | ORAL | Status: DC
  Administered 2017-09-18 – 2017-09-20 (×3): 20 mg via ORAL
  Filled 2017-09-18 (×3): qty 1

## 2017-09-18 MED ORDER — HEPARIN SODIUM (PORCINE) 5000 UNIT/ML IJ SOLN
5000.0000 [IU] | Freq: Three times a day (TID) | INTRAMUSCULAR | Status: DC
  Administered 2017-09-18 – 2017-09-21 (×8): 5000 [IU] via SUBCUTANEOUS
  Filled 2017-09-18 (×8): qty 1

## 2017-09-18 MED ORDER — MIDAZOLAM HCL 5 MG/5ML IJ SOLN
1.0000 mg | Freq: Once | INTRAMUSCULAR | Status: AC
Start: 2017-09-18 — End: 2017-09-18
  Administered 2017-09-18: 1 mg via INTRAVENOUS

## 2017-09-18 MED ORDER — HYDROCHLOROTHIAZIDE 25 MG PO TABS
25.0000 mg | ORAL_TABLET | Freq: Every day | ORAL | Status: DC
  Administered 2017-09-18 – 2017-09-20 (×3): 25 mg via ORAL
  Filled 2017-09-18 (×3): qty 1

## 2017-09-18 MED ORDER — HYDRALAZINE HCL 50 MG PO TABS
50.0000 mg | ORAL_TABLET | Freq: Every day | ORAL | Status: DC
  Administered 2017-09-19 – 2017-09-21 (×3): 50 mg via ORAL
  Filled 2017-09-18 (×3): qty 1

## 2017-09-18 MED ORDER — HYDRALAZINE HCL 20 MG/ML IJ SOLN
10.0000 mg | INTRAMUSCULAR | Status: DC | PRN
  Administered 2017-09-18: 10 mg via INTRAVENOUS
  Filled 2017-09-18: qty 1

## 2017-09-18 MED ORDER — ALBUTEROL SULFATE (2.5 MG/3ML) 0.083% IN NEBU
INHALATION_SOLUTION | RESPIRATORY_TRACT | Status: AC
  Administered 2017-09-18: 15:00:00 via RESPIRATORY_TRACT
  Filled 2017-09-18: qty 3

## 2017-09-18 MED ORDER — ONDANSETRON 4 MG PO TBDP: 4.0000 mg | ORAL_TABLET | Freq: Four times a day (QID) | ORAL | Status: DC | PRN

## 2017-09-18 MED ORDER — MIDAZOLAM HCL 5 MG/5ML IJ SOLN
INTRAMUSCULAR | Status: AC
  Administered 2017-09-18: 1 mg via INTRAVENOUS
  Filled 2017-09-18: qty 5

## 2017-09-18 MED ORDER — LACTATED RINGERS IV SOLN
INTRAVENOUS | Status: DC
  Administered 2017-09-18: 16:00:00 via INTRAVENOUS

## 2017-09-18 MED ORDER — VITAMIN B-12 1000 MCG PO TABS
1000.0000 ug | ORAL_TABLET | Freq: Every day | ORAL | Status: DC
  Administered 2017-09-19 – 2017-09-21 (×3): 1000 ug via ORAL
  Filled 2017-09-18 (×3): qty 1

## 2017-09-18 MED ORDER — ASPIRIN EC 81 MG PO TBEC
81.0000 mg | DELAYED_RELEASE_TABLET | Freq: Every day | ORAL | Status: DC
  Administered 2017-09-19 – 2017-09-21 (×3): 81 mg via ORAL
  Filled 2017-09-18 (×3): qty 1

## 2017-09-18 MED ORDER — MORPHINE SULFATE (PF) 2 MG/ML IV SOLN
2.0000 mg | INTRAVENOUS | Status: DC | PRN
Start: 2017-09-18 — End: 2017-09-19

## 2017-09-18 MED ORDER — LABETALOL HCL 100 MG PO TABS
300.0000 mg | ORAL_TABLET | Freq: Two times a day (BID) | ORAL | Status: DC
  Administered 2017-09-18 – 2017-09-21 (×6): 300 mg via ORAL
  Filled 2017-09-18 (×6): qty 3

## 2017-09-18 MED ORDER — GABAPENTIN 300 MG PO CAPS
300.0000 mg | ORAL_CAPSULE | Freq: Three times a day (TID) | ORAL | Status: DC
  Administered 2017-09-18 – 2017-09-21 (×9): 300 mg via ORAL
  Filled 2017-09-18 (×9): qty 1

## 2017-09-18 MED ORDER — ALLOPURINOL 100 MG PO TABS
100.0000 mg | ORAL_TABLET | Freq: Every day | ORAL | Status: DC
  Administered 2017-09-19 – 2017-09-21 (×3): 100 mg via ORAL
  Filled 2017-09-18 (×3): qty 1

## 2017-09-18 MED ORDER — ONDANSETRON HCL 4 MG/2ML IJ SOLN: 4.0000 mg | Freq: Four times a day (QID) | INTRAMUSCULAR | Status: DC | PRN

## 2017-09-18 MED ORDER — PANTOPRAZOLE SODIUM 40 MG PO TBEC
40.0000 mg | DELAYED_RELEASE_TABLET | Freq: Every day | ORAL | Status: DC
  Administered 2017-09-18 – 2017-09-21 (×4): 40 mg via ORAL
  Filled 2017-09-18 (×4): qty 1

## 2017-09-18 MED ORDER — ALBUTEROL SULFATE (2.5 MG/3ML) 0.083% IN NEBU
2.5000 mg | INHALATION_SOLUTION | Freq: Once | RESPIRATORY_TRACT | Status: AC
  Administered 2017-09-18: 2.5 mg via RESPIRATORY_TRACT

## 2017-09-18 NOTE — ED Notes (Signed)
Patients O2 turned down to 2L

## 2017-09-18 NOTE — ED Notes (Signed)
EMS reported patient was 79% on RA. Patient on 4L o2 upon arrival to ER

## 2017-09-18 NOTE — ED Provider Notes (Addendum)
[facility] Emergency Department Provider Note  ____________________________________________   First MD Initiated Contact with Patient 09/18/17 1035     (approximate)  I have reviewed the triage vital signs and the nursing notes.   HISTORY  Chief Complaint Shortness of Breath   HPI Tony Mitchell is a 77 y.o. male with a history of previous pneumothorax approximately 1 year ago who is presenting to the emergency department today with sudden onset shortness of breath that started this morning after he woke up.  He is denying any pain but says that he is having "heaving respirations" which is similar to how he felt previously when he required a chest tube.  He was brought in by EMS found to be 79% in the field and placed him on nasal cannula with resolution of his oxygen saturations to the 90s.   Past Medical History:  Diagnosis Date  . COPD (chronic obstructive pulmonary disease) ([place])   . Coronary artery disease   . Gout   . Hyperlipidemia   . Hypertension   . Sleep apnea     Patient Active Problem List   Diagnosis Date Noted  . GERD (gastroesophageal reflux disease) 06/13/2016  . Gout 06/13/2016  . Hyperlipidemia, unspecified 06/13/2016  . Hypertension 06/13/2016  . Pneumothorax on right 06/03/2016  . Elevated PSA 03/29/2016  . Elevated blood sugar 09/29/2015  . Rash 02/17/2015  . OSA (obstructive sleep apnea) 07/08/2014  . S/P cardiac catheterization 05/28/2014  . SOB (shortness of breath) on exertion 05/28/2014    Past Surgical History:  Procedure Laterality Date  . CATARACT EXTRACTION  2012  . KNEE SURGERY Left 1965    Prior to Admission medications   Medication Sig Start Date End Date Taking? Authorizing Provider  albuterol (PROVENTIL HFA;VENTOLIN HFA) 108 (90 Base) MCG/ACT inhaler Inhale 2 puffs into the lungs every 6 (six) hours as needed for wheezing or shortness of breath. 08/08/17   Arta Silence, MD  allopurinol  (ZYLOPRIM) 100 MG tablet TAKE 1 TABLET BY MOUTH EVERY DAY 06/17/17   [provider]  ALPRAZolam Duanne Moron) 0.5 MG tablet Take 0.5 mg by mouth at bedtime as needed for anxiety.    [provider]  aspirin EC 81 MG tablet Take 81 mg by mouth daily.    [provider]  cloNIDine (CATAPRES) 0.3 MG tablet Take 0.3 mg by mouth 2 (two) times daily.     [provider]  cyanocobalamin 1000 MCG tablet Take 1,000 mcg by mouth daily.    [provider]  gabapentin (NEURONTIN) 300 MG capsule Take 300 mg by mouth 3 (three) times daily.    [provider]  hydrALAZINE (APRESOLINE) 50 MG tablet Take 50 mg by mouth daily.     [provider]  hydrochlorothiazide (HYDRODIURIL) 25 MG tablet Take 25 mg by mouth daily. 05/14/16   [provider]  ibuprofen (ADVIL,MOTRIN) 800 MG tablet Take 800 mg by mouth 3 (three) times daily.    [provider]  labetalol (NORMODYNE) 300 MG tablet Take 300 mg by mouth 2 (two) times daily.    [provider]  lansoprazole (PREVACID) 15 MG capsule Take 15 mg by mouth daily at 12 noon.    [provider]  lisinopril (PRINIVIL,ZESTRIL) 40 MG tablet TAKE 1 TABLET BY MOUTH EVERY DAY 06/18/16   [provider]  predniSONE (DELTASONE) 20 MG tablet Take 3 tablets (60 mg total) by mouth daily with breakfast. 08/09/17   Arta Silence, MD  sildenafil (REVATIO) 20 MG tablet Take 100 mg by mouth daily as needed (erectile dysfunction).     [provider]  simvastatin (ZOCOR) 20 MG tablet Take by mouth. 04/04/17 04/04/18  [provider]    Allergies Patient has no known allergies.  Family History  Problem Relation Age of Onset  . Cancer Father        Unknown  . Prostate cancer Neg Hx   . Lung cancer Neg Hx     Social History Social History   Tobacco Use  . Smoking status: Former Smoker    Types: Cigarettes    Last attempt to quit: 06/15/1996    Years  since quitting: 21.2  . Smokeless tobacco: Never Used  Substance Use Topics  . Alcohol use: Yes    Comment: 1 mixed drink daily  . Drug use: No    Review of Systems  Constitutional: No fever/chills Eyes: No visual changes. ENT: No sore throat. Cardiovascular: Denies chest pain. Respiratory: As above  gastrointestinal: No abdominal pain.  No nausea, no vomiting.  No diarrhea.  No constipation. Genitourinary: Negative for dysuria. Musculoskeletal: Negative for back pain. Skin: Negative for rash. Neurological: Negative for headaches, focal weakness or numbness.   ____________________________________________   PHYSICAL EXAM:  VITAL SIGNS: ED Triage Vitals [09/18/17 1047]  Enc Vitals Group     BP (!) 194/121     Pulse Rate 86     Resp (!) 24     Temp      Temp src      SpO2 95 %     Weight      Height      Head Circumference      Peak Flow      Pain Score      Pain Loc      Pain Edu?      Excl. in [place]?     Constitutional: Alert and oriented.  Patient with respiratory distress.  Able to speak in 3-4 word sentences but with very much increased work of breathing. Eyes: Conjunctivae are normal.  Head: Atraumatic. Nose: No congestion/rhinnorhea. Mouth/Throat: Mucous membranes are moist.  Neck: No stridor.   Cardiovascular: Normal rate, regular rhythm. Grossly normal heart sounds.   Respiratory:   No retractions.  Decreased breath sounds over the right field throughout. Gastrointestinal: Soft and nontender. No distention.  Musculoskeletal: No lower extremity tenderness nor edema.  No joint effusions. Neurologic:  Normal speech and language. No gross focal neurologic deficits are appreciated. Skin:  Skin is warm, dry and intact. No rash noted. Psychiatric: Mood and affect are normal. Speech and behavior are normal.  ____________________________________________   LABS (all labs ordered are listed, but only abnormal results are displayed)  Labs Reviewed  BASIC  METABOLIC PANEL - Abnormal; Notable for the following components:      Result Value   Glucose, Bld 141 (*)    BUN 29 (*)    Calcium 8.8 (*)    GFR calc non Af Amer 55 (*)    All other components within normal limits  CBC WITH DIFFERENTIAL/PLATELET  PROTIME-INR  APTT  TROPONIN I  TYPE AND SCREEN   ____________________________________________  EKG ED ECG REPORT I, Doran Stabler, the attending physician, personally viewed and interpreted this ECG.   Date: 09/18/2017  EKG Time: 1040  Rate: 83  Rhythm: normal sinus rhythm  Axis: Normal  Intervals:none  ST&T Change: No ST segment elevation or depression.  Single T wave inversion in aVL.  ____________________________________________  RADIOLOGY  Initial x-ray with large right sided pneumothorax without mediastinal shift.  Status post chest tube there is decreased right pneumothorax now estimated at 25% after chest tube placement. ____________________________________________   PROCEDURES  Procedure(s) performed:    CHEST TUBE INSERTION Date/Time: 09/18/2017 12:38 PM Performed by: Orbie Pyo, MD Authorized by: Orbie Pyo, MD   Consent:    Consent obtained:  Written and emergent situation   Consent given by:  Patient   Risks discussed:  Bleeding, incomplete drainage, nerve damage, damage to surrounding structures, infection and pain   Alternatives discussed:  No treatment Pre-procedure details:    Skin preparation:  ChloraPrep   Preparation: Patient was prepped and draped in the usual sterile fashion   Sedation:    Sedation type:  Anxiolysis Anesthesia (see MAR for exact dosages):    Anesthesia method:  Local infiltration   Local anesthetic:  Lidocaine 1% w/o epi Procedure details:    Placement location:  R lateral   Scalpel size:  11   Tube size (Fr):  24   Ultrasound guidance: no     Tension pneumothorax: no     Tube connected to:  Suction   Drainage characteristics:  Air only    Suture material:  0 silk   Dressing:  4x4 sterile gauze and petrolatum-impregnated gauze Post-procedure details:    Post-insertion x-ray findings: tube in good position     Patient tolerance of procedure:  Tolerated well, no immediate complications Comments:     Used seldinger technique kit.  .Critical Care Performed by: Orbie Pyo, MD Authorized by: Orbie Pyo, MD   Critical care provider statement:    Critical care time (minutes):  45   Critical care time was exclusive of:  Separately billable procedures and treating other patients   Critical care was necessary to treat or prevent imminent or life-threatening deterioration of the following conditions:  Respiratory failure   Critical care was time spent personally by me on the following activities:  Discussions with consultants, evaluation of patient's response to treatment, ordering and review of laboratory studies, ordering and performing treatments and interventions and re-evaluation of patient's condition    Critical Care performed:   ____________________________________________   INITIAL IMPRESSION / ASSESSMENT AND PLAN / ED COURSE  Pertinent labs & imaging results that were available during my care of the patient were reviewed by me and considered in my medical decision making (see chart for details).  Differential includes, but is not limited to, viral syndrome, bronchitis including COPD exacerbation, pneumonia, reactive airway disease including asthma, CHF including exacerbation with or without pulmonary/interstitial edema, pneumothorax, ACS, thoracic trauma, and pulmonary embolism. As part of my medical decision making, I reviewed the following data within the [facility] chart reviewed and Notes from prior ED visits  ----------------------------------------- 12:40 PM on 09/18/2017 -----------------------------------------  Currently weaning down patient from his nasal  cannula oxygen.  Feeling well after the chest tube placement.  Patient will be admitted to the surgical service.  Dr. Adora Fridge is currently in the operating room but will be seeing the patient status post to surgery to admit.  I also made Dr. Genevive Bi of cardiothoracic's aware.      ____________________________________________   FINAL CLINICAL IMPRESSION(S) / ED DIAGNOSES  Right-sided pneumothorax.    NEW MEDICATIONS STARTED DURING THIS VISIT:  New Prescriptions   No medications on file     Note:  This document was prepared using Dragon voice recognition software and  may include unintentional dictation errors.     Orbie Pyo, MD 09/18/17 [place], Randall An, MD 09/18/17 [phone]

## 2017-09-18 NOTE — ED Triage Notes (Signed)
Pt ems from home for SOB. Pt with new diagnosis of COPD.

## 2017-09-18 NOTE — H&P (Signed)
Patient ID: BROADY Mitchell, male   DOB: Sep 29, 1940, 77 y.o.   MRN: [id number]  HPI Tony Mitchell is a 77 y.o. male seen the ER .  She woke up this morning with shortness of breath and right-sided pleuritic pain.  He reported that he dropped his sats and had significant dyspnea on minimal exertion.  He experienced some intermittent mild pain in the right chest.  Note this is the second episode of spontaneous pneumothorax and 1  year ago he had the same issue.  No previous evidence of COPD. Ct scan personally reviewed from 4 weeks ago as well as CXR. Right PTX, repeated cxr showed decent expansion of right lung , ct in place. CT no bullae or copd.  HPI  Past Medical History:  Diagnosis Date  . COPD (chronic obstructive pulmonary disease) ([place])   . Coronary artery disease   . Gout   . Hyperlipidemia   . Hypertension   . Sleep apnea     Past Surgical History:  Procedure Laterality Date  . CATARACT EXTRACTION  2012  . KNEE SURGERY Left 1965    Family History  Problem Relation Age of Onset  . Cancer Father        Unknown  . Prostate cancer Neg Hx   . Lung cancer Neg Hx     Social History Social History   Tobacco Use  . Smoking status: Former Smoker    Types: Cigarettes    Last attempt to quit: 06/15/1996    Years since quitting: 21.2  . Smokeless tobacco: Never Used  Substance Use Topics  . Alcohol use: Yes    Comment: 1 mixed drink daily  . Drug use: No    No Known Allergies  Current Facility-Administered Medications  Medication Dose Route Frequency Provider Last Rate Last Dose  . lidocaine (PF) (XYLOCAINE) 1 % injection           . lidocaine (PF) (XYLOCAINE) 1 % injection            Current Outpatient Medications  Medication Sig Dispense Refill  . albuterol (PROVENTIL HFA;VENTOLIN HFA) 108 (90 Base) MCG/ACT inhaler Inhale 2 puffs into the lungs every 6 (six) hours as needed for wheezing or shortness of breath. 1 Inhaler 2  . allopurinol (ZYLOPRIM) 100 MG  tablet TAKE 1 TABLET BY MOUTH EVERY DAY    . ALPRAZolam (XANAX) 0.5 MG tablet Take 0.5 mg by mouth at bedtime as needed for anxiety.    Marland Kitchen aspirin EC 81 MG tablet Take 81 mg by mouth daily.    . cloNIDine (CATAPRES) 0.3 MG tablet Take 0.3 mg by mouth 2 (two) times daily.     . cyanocobalamin 1000 MCG tablet Take 1,000 mcg by mouth daily.    Marland Kitchen gabapentin (NEURONTIN) 300 MG capsule Take 300 mg by mouth 3 (three) times daily.    . hydrALAZINE (APRESOLINE) 50 MG tablet Take 50 mg by mouth daily.     . hydrochlorothiazide (HYDRODIURIL) 25 MG tablet Take 25 mg by mouth daily.  11  . ibuprofen (ADVIL,MOTRIN) 800 MG tablet Take 800 mg by mouth 3 (three) times daily.    Marland Kitchen labetalol (NORMODYNE) 300 MG tablet Take 300 mg by mouth 2 (two) times daily.    . lansoprazole (PREVACID) 15 MG capsule Take 15 mg by mouth daily at 12 noon.    Marland Kitchen lisinopril (PRINIVIL,ZESTRIL) 40 MG tablet TAKE 1 TABLET BY MOUTH EVERY DAY    . predniSONE (DELTASONE) 20 MG tablet Take  3 tablets (60 mg total) by mouth daily with breakfast. 12 tablet 0  . sildenafil (REVATIO) 20 MG tablet Take 100 mg by mouth daily as needed (erectile dysfunction).     . simvastatin (ZOCOR) 20 MG tablet Take by mouth.       Review of Systems Full ROS  was asked and was negative except for the information on the HPI  Physical Exam Blood pressure (!) 165/88, pulse 69, temperature 98.4 F (36.9 C), temperature source Oral, resp. rate 17, height 5' 9.5" (1.765 m), weight 86.2 kg (190 lb), SpO2 96 %. CONSTITUTIONAL: Elderly male in NAD EYES: Pupils are equal, round, and reactive to light, Sclera are non-icteric. EARS, NOSE, MOUTH AND THROAT: The oropharynx is clear. The oral mucosa is pink and moist. Hearing is intact to voice. LYMPH NODES:  Lymph nodes in the neck are normal. RESPIRATORY:  Lungs are clear but distant lung sounds.There is no pathologic use of accessory muscles. CT is in place , no airleak CARDIOVASCULAR: Heart is regular without  murmurs, gallops, or rubs. GI: The abdomen is soft, nontender, and nondistended. There are no palpable masses. There is no hepatosplenomegaly. There are normal bowel sounds in all quadrants. GU: Rectal deferred.   MUSCULOSKELETAL: Normal muscle strength and tone. No cyanosis or edema.   SKIN: Turgor is good and there are no pathologic skin lesions or ulcers. NEUROLOGIC: Motor and sensation is grossly normal. Cranial nerves are grossly intact. PSYCH:  Oriented to person, place and time. Affect is normal.  Data Reviewed I have personally reviewed the patient's imaging, laboratory findings and medical records.    Assessment/Plan.    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  Will admit place a chest tube on water suction.  Pulmonary toilet and oxygen therapy.  Serial chest x-ray in the morning.  Will discuss with Dr. Genevive Bi but more than likely he will require VATS pleurodesis.  Discussed with the patient briefly and he is going to start thinking about it.  He is not very excited about a potential upcoming surgery.  Discussed this case with Dr. Lyndle Herrlich and Dr. Genevive Bi in detail. Caroleen Hamman, MD FACS General Surgeon 09/18/2017, 1:55 PM

## 2017-09-18 NOTE — ED Notes (Signed)
ED Provider at bedside placing chest tube

## 2017-09-19 ENCOUNTER — Inpatient Hospital Stay: Payer: Medicare Other

## 2017-09-19 LAB — COMPREHENSIVE METABOLIC PANEL
ALK PHOS: 52 U/L (ref 38–126)
ALT: 26 U/L (ref 17–63)
AST: 32 U/L (ref 15–41)
Albumin: 3.8 g/dL (ref 3.5–5.0)
Anion gap: 10 (ref 5–15)
BUN: 24 mg/dL — AB (ref 6–20)
CALCIUM: 8.8 mg/dL — AB (ref 8.9–10.3)
CHLORIDE: 104 mmol/L (ref 101–111)
CO2: 23 mmol/L (ref 22–32)
CREATININE: 1.14 mg/dL (ref 0.61–1.24)
GFR calc non Af Amer: >60 mL/min (ref >60)
GLUCOSE: 120 mg/dL — AB (ref 65–99)
Potassium: 3.9 mmol/L (ref 3.5–5.1)
SODIUM: 137 mmol/L (ref 135–145)
Total Bilirubin: 1.1 mg/dL (ref 0.3–1.2)
Total Protein: 6.3 g/dL — ABNORMAL LOW (ref 6.5–8.1)

## 2017-09-19 LAB — CBC
HEMATOCRIT: 36.4 % — AB (ref 40.0–52.0)
Hemoglobin: 12.4 g/dL — ABNORMAL LOW (ref 13.0–18.0)
MCH: 31.6 pg (ref 26.0–34.0)
MCHC: 34.2 g/dL (ref 32.0–36.0)
MCV: 92.6 fL (ref 80.0–100.0)
PLATELETS: 174 10*3/uL (ref 150–440)
RBC: 3.93 MIL/uL — ABNORMAL LOW (ref 4.40–5.90)
RDW: 13.5 % (ref 11.5–14.5)
WBC: 8.6 10*3/uL (ref 3.8–10.6)

## 2017-09-19 LAB — PROTIME-INR
INR: 0.96
Prothrombin Time: 12.7 seconds (ref 11.4–15.2)

## 2017-09-19 LAB — APTT: aPTT: 28 seconds (ref 24–36)

## 2017-09-19 NOTE — Progress Notes (Signed)
  Patient ID: Tony Mitchell, male   DOB: 1940-11-23, 77 y.o.   MRN: [id number]  HISTORY: Feels well.  Not short of breath.  No pain.   Vitals:   09/18/17 2126 09/19/17 0450  BP: (!) 159/77 133/69  Pulse: 85 63  Resp: 17 18  Temp: 98.8 F (37.1 C) (!) 97.5 F (36.4 C)  SpO2: 96% 98%     EXAM:    Resp: Lungs are clear bilaterally.  No respiratory distress, normal effort. Heart:  Regular without murmurs Abd:  Abdomen is soft, non distended and non tender. No masses are palpable.  There is no rebound and no guarding.  Neurological: Alert and oriented to person, place, and time. Coordination normal.  Skin: Skin is warm and dry. No rash noted. No diaphoretic. No erythema. No pallor.  Psychiatric: Normal mood and affect. Normal behavior. Judgment and thought content normal.    ASSESSMENT: No air leak today and CXRay shows no pneumothorax   PLAN:   Again reviewed the risks of VATS with talc and nonoperative management.  He has chosen a nonoperative approach.  We will leave chest tube to water seal and we will ask Dr. Raul Del to see.      Nestor Lewandowsky, MD

## 2017-09-19 NOTE — Progress Notes (Signed)
Date: 09/19/2017,   MRN# [id number] Danarius [facility]ccon[facility] 01/21/1941 Code Status:     Code Status Orders  (From admission, onward)        Start     Ordered   09/18/17 1512  Full code  Continuous     09/18/17 1511    Code Status History    Date Active Date Inactive Code Status Order ID Comments User Context   06/03/2016 02:08 06/05/2016 16:21 Full Code [id number]  Olean Ree, MD ED     [facility] day:@LENGTHOFSTAYDAYS @ Referring MD: @ATDPROV @      CC: Recurrent pneumothorax   HPI: This is a 77 yr old male. Ex smoker, overweight, on cpap for sleep apnea, copd, here one year or so later with another pneuomthorax ( right). Chest tube in place, re expansion has taken place. Comfortable.     PMHX:   Past Medical History:  Diagnosis Date  . COPD (chronic obstructive pulmonary disease) ([place])   . Coronary artery disease   . Gout   . Hyperlipidemia   . Hypertension   . Sleep apnea    Surgical Hx:  Past Surgical History:  Procedure Laterality Date  . CATARACT EXTRACTION  2012  . KNEE SURGERY Left 1965   Family Hx:  Family History  Problem Relation Age of Onset  . Cancer Father        Unknown  . Prostate cancer Neg Hx   . Lung cancer Neg Hx    Social Hx:   Social History   Tobacco Use  . Smoking status: Former Smoker    Types: Cigarettes    Last attempt to quit: 06/15/1996    Years since quitting: 21.2  . Smokeless tobacco: Never Used  Substance Use Topics  . Alcohol use: Yes    Comment: 1 mixed drink daily  . Drug use: No   Medication:    Home Medication:    Current Medication: @CURMEDTAB @   Allergies:  Patient has no known allergies.  Review of Systems: Gen:  Denies  fever, sweats, chills HEENT: Denies blurred vision, double vision, ear pain, eye pain, hearing loss, nose bleeds, sore throat Cvc:  No dizziness, chest pain or heaviness Resp:   Less sob, no pleurisy, no hemoptysis.  Gi: Denies swallowing difficulty, stomach pain, nausea or vomiting, diarrhea,  constipation, bowel incontinence Gu:  Denies bladder incontinence, burning urine Ext:   No Joint pain, stiffness or swelling Skin: No skin rash, easy bruising or bleeding or hives Endoc:  No polyuria, polydipsia , polyphagia or weight change Psych: No depression, insomnia or hallucinations  Other:  All other systems negative  Physical Examination:   VS: BP (!) 162/75 (BP Location: Right Arm)   Pulse 77   Temp 98.1 F (36.7 C)   Resp 17   Ht 5' 9.5" (1.765 m)   Wt 190 lb (86.2 kg)   SpO2 95%   BMI 27.66 kg/m   General Appearance: No distress  Neuro: without focal findings, mental status, speech normal, alert and oriented, cranial nerves 2-12 intact, reflexes normal and symmetric, sensation grossly normal  HEENT: PERRLA, EOM intact, no ptosis, no other lesions noticed, Mallampati: Pulmonary:.No wheezing, No rales  Sputum Production:   Cardiovascular:  Normal S1,S2.  No m/r/g.  Abdominal aorta pulsation normal.    Abdomen:Benign, Soft, non-tender, No masses, hepatosplenomegaly, No lymphadenopathy Endoc: No evident thyromegaly, no signs of acromegaly or Cushing features Skin:   warm, no rashes, no ecchymosis  Extremities: normal, no cyanosis, clubbing, no edema, warm with  normal capillary refill. Other findings:   Labs results:   Recent Labs    09/18/17 1039 09/19/17 0338  HGB 13.9 12.4*  HCT 41.3 36.4*  MCV 92.4 92.6  WBC 7.0 8.6  BUN 29* 24*  CREATININE 1.23 1.14  GLUCOSE 141* 120*  CALCIUM 8.8* 8.8*  INR 0.96 0.96  ,    No results for input(s): PH in the last 72 hours.  Invalid input(s): PCO2, PO2, BASEEXCESS, BASEDEFICITE, TFT  Culture results:     Rad results:   Dg Chest 2 View  Result Date: 09/19/2017 CLINICAL DATA:  77 year old male with spontaneous right pneumothorax. EXAM: CHEST  2 VIEW COMPARISON:  09/18/2017 and earlier. FINDINGS: Seated AP and lateral views of the chest. Stable right chest tube. Decreased residual right pneumothorax, with only trace  pleural edge visible at the apex and laterally at the level of the chest tube. Mildly increased right chest wall subcutaneous gas. Stable lung volumes and mediastinal contours. No pleural effusion or confluent pulmonary opacity. No acute osseous abnormality identified. Calcified aortic atherosclerosis. IMPRESSION: 1. Stable right chest tube and decreased right pneumothorax since yesterday, trace residual. 2. Mildly increased right chest wall subcutaneous gas. 3. No new cardiopulmonary abnormality. Electronically Signed   By: Genevie Ann M.D.   On: 09/19/2017 08:56   CONTRAST:  61mL ISOVUE-370 IOPAMIDOL (ISOVUE-370) INJECTION 76% IV  COMPARISON:  None  FINDINGS: Cardiovascular: Atherosclerotic calcifications aorta, coronary arteries and proximal great vessels. Aneurysmal dilatation ascending thoracic aorta 4.3 cm transverse image 45. No pericardial effusion. Question minimal enlargement of cardiac chambers. Pulmonary arteries well opacified and patent. No evidence of pulmonary embolism.  Mediastinum/Nodes: Esophagus unremarkable. Base of cervical region normal appearance. No thoracic adenopathy.  Lungs/Pleura: Minimal linear subsegmental atelectasis at lung bases. Lungs otherwise clear. No pulmonary infiltrate, pleural effusion or pneumothorax.  Upper Abdomen: Unremarkable  Musculoskeletal: Scattered endplate spur formation thoracic spine. No acute osseous lesions.  Review of the MIP images confirms the above findings.  IMPRESSION: No evidence of pulmonary embolism.  Minimal bibasilar atelectasis.  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  Recommend annual imaging followup by CTA or MRA. This recommendation follows 2010 ACCF/AHA/AATS/ACR/ASA/SCA/SCAI/SIR/STS/SVM Guidelines for the Diagnosis and Management of Patients with Thoracic Aortic Disease. Circulation.  2010; 121: T888-K800  Aortic Atherosclerosis (ICD10-I70.0).  Aortic aneurysm NOS (ICD10-I71.9).   Electronically Signed   By: Lavonia Dana M.D.   On: 08/08/2017 17:49  Assessment and Plan: Recurrent pneumothorax: . 50 % chance that he will have another ptx.  Hence I am recommending pleurodesis. Ideally via vats. ? Etiology. Chest and xrays do not show notable bullus dz, fibrosis etc at his age doubt histocytosis X -chest tube care per t-surg -check alpha-1-antitrypsin level and phenotype -pleurodesis next -following   I have personally obtained a history, examined the patient, evaluated laboratory and imaging results, formulated the assessment and plan and placed orders.  The Patient requires high complexity decision making for assessment and support, frequent evaluation and titration of therapies, application of advanced monitoring technologies and extensive interpretation of multiple databases.   Kyandra Mcclaine,M.D. Board certified Pulmonary & Critical care Medicine [facility]

## 2017-09-20 ENCOUNTER — Inpatient Hospital Stay: Payer: Medicare Other

## 2017-09-20 NOTE — Care Management Important Message (Signed)
Important Message  Patient Details  Name: Tony Mitchell MRN: [id number] Date of Birth: 03-11-1941   Medicare Important Message Given:  Yes    Beverly Sessions, RN 09/20/2017, 3:57 PM

## 2017-09-20 NOTE — Progress Notes (Signed)
  Patient ID: Tony Mitchell, male   DOB: Oct 06, 1940, 77 y.o.   MRN: [id number]  HISTORY: No problems.  Not short of breath.   Vitals:   09/19/17 2038 09/20/17 0416  BP: 122/61 115/61  Pulse: 70 60  Resp: 16 18  Temp: 98.3 F (36.8 C) 97.8 F (36.6 C)  SpO2: 96% 96%     EXAM:    Resp: Lungs are clear bilaterally but distant.  No respiratory distress, normal effort. Heart:  Regular without murmurs Abd:  Abdomen is soft, non distended and non tender. No masses are palpable.  There is no rebound and no guarding.  Neurological: Alert and oriented to person, place, and time. Coordination normal.  Skin: Skin is warm and dry. No rash noted. No diaphoretic. No erythema. No pallor.  Some crepitus around chest tube  Psychiatric: Normal mood and affect. Normal behavior. Judgment and thought content normal.   No air leak.  ASSESSMENT: Independent review of CXRay shows no pneumothorax but more subcutaneous emphysema.      PLAN:   Leave chest tube on water seal today.  He still does not want pleurodesis    Nestor Lewandowsky, MD

## 2017-09-21 ENCOUNTER — Inpatient Hospital Stay: Payer: Medicare Other

## 2017-09-21 DIAGNOSIS — J9311 Primary spontaneous pneumothorax: Secondary | ICD-10-CM

## 2017-09-21 NOTE — Discharge Summary (Signed)
Physician Discharge Summary  Patient ID: Tony Mitchell MRN: [id number] DOB/AGE: [birth year] 77 y.o.  Admit date: 09/18/2017 Discharge date: 09/21/2017   Discharge Diagnoses:  Active Problems:   Pneumothorax   Procedures:  Tube thoracostomy   Hospital Course: Admitted with recurrent spontaneous pneumothorax.  Chest tube inserted in ER with prompt lung expansion.  Chest XRay monitored and no pneumothorax or air leak for almost 48 hours so tube removed.  Discussed options of VATS with talc but he declined.  Seen by Dr. Raul Del.  He will see me in one week.  Disposition: 01-Home or Self Care  Discharge Instructions    Diet - low sodium heart healthy   Complete by:  As directed    Increase activity slowly   Complete by:  As directed    Other Restrictions   Complete by:  As directed    Leave dressing in place for 48 hours then remove and get in shower and then cover with Band Aid.  Call the office on Monday and make appointment for one week.       Nestor Lewandowsky, MD

## 2017-09-21 NOTE — Progress Notes (Signed)
  Patient ID: KYL GIVLER, male   DOB: 1940/12/04, 77 y.o.   MRN: [id number]  HISTORY: Breathing is fine.  No pain. Apetite is good.   Vitals:   09/20/17 2114 09/21/17 0509  BP: (!) 158/76 139/74  Pulse: 70 62  Resp: 16 12  Temp: 98.5 F (36.9 C) (!) 97.5 F (36.4 C)  SpO2: 97% 97%     EXAM:    Resp: Lungs are clear bilaterally.  No respiratory distress, normal effort. Heart:  Regular without murmurs Abd:  Abdomen is soft, non distended and non tender. No masses are palpable.  There is no rebound and no guarding.  Neurological: Alert and oriented to person, place, and time. Coordination normal.  Skin: Skin is warm and dry. No rash noted. No diaphoretic. No erythema. No pallor.  Psychiatric: Normal mood and affect. Normal behavior. Judgment and thought content normal.   Independent review of CXray shows no pneumothorax.     ASSESSMENT: Recurrent right sided pneumothorax.   PLAN:   Chest tube removed without incident. Will discharge to home. Followup in one week    Nestor Lewandowsky, MD

## 2017-09-23 ENCOUNTER — Other Ambulatory Visit: Payer: Self-pay

## 2017-09-24 LAB — ALPHA-1 ANTITRYPSIN PHENOTYPE: A-1 Antitrypsin, Ser: 118 mg/dL (ref 90–200)

## 2017-09-26 ENCOUNTER — Ambulatory Visit
Admission: RE | Admit: 2017-09-26 | Discharge: 2017-09-26 | Disposition: A | Discharge status: Home / Self Care | Payer: Medicare Other | Source: Ambulatory Visit | Attending: Cardiothoracic Surgery | Admitting: Cardiothoracic Surgery

## 2017-09-26 ENCOUNTER — Other Ambulatory Visit: Payer: Self-pay

## 2017-09-26 DIAGNOSIS — J449 Chronic obstructive pulmonary disease, unspecified: Secondary | ICD-10-CM | POA: Insufficient documentation

## 2017-09-26 DIAGNOSIS — R939 Diagnostic imaging inconclusive due to excess body fat of patient: Secondary | ICD-10-CM | POA: Diagnosis present

## 2017-09-26 DIAGNOSIS — J939 Pneumothorax, unspecified: Secondary | ICD-10-CM

## 2017-09-27 ENCOUNTER — Encounter: Payer: Self-pay | Admitting: Cardiothoracic Surgery

## 2017-09-27 ENCOUNTER — Ambulatory Visit (INDEPENDENT_AMBULATORY_CARE_PROVIDER_SITE_OTHER): Payer: Medicare Other | Admitting: Cardiothoracic Surgery

## 2017-09-27 VITALS — BP 173/90 | HR 79 | Temp 98.2°F | Resp 18 | Ht 69.5 in | Wt 199.0 lb

## 2017-09-27 DIAGNOSIS — J9383 Other pneumothorax: Secondary | ICD-10-CM | POA: Diagnosis not present

## 2017-09-27 NOTE — Progress Notes (Signed)
  Patient ID: Tony Mitchell, male   DOB: Feb 12, 1941, 77 y.o.   MRN: [id number]  HISTORY: He returns today in follow-up.  He did sustain a spontaneous right-sided pneumothorax treated with a chest tube.  He comes in today without any specific complaints.  He is not short of breath.  He did have a repeat chest x-ray made.   Vitals:   09/27/17 1110  BP: (!) 173/90  Pulse: 79  Resp: 18  Temp: 98.2 F (36.8 C)  SpO2: 94%     EXAM:    Resp: Lungs are clear bilaterally.  No respiratory distress, normal effort. Heart:  Regular without murmurs Abd:  Abdomen is soft, non distended and non tender. No masses are palpable.  There is no rebound and no guarding.  Neurological: Alert and oriented to person, place, and time. Coordination normal.  Skin: Skin is warm and dry. No rash noted. No diaphoretic. No erythema. No pallor.  Psychiatric: Normal mood and affect. Normal behavior. Judgment and thought content normal.    ASSESSMENT: I have independently reviewed the chest x-ray.  There is no evidence of a pneumothorax.  There is less subcutaneous air.   PLAN:   At the present time I did not see any indications for surgical intervention.  He will come back to see [place] on an as-needed basis.    Nestor Lewandowsky, MD

## 2017-09-27 NOTE — Patient Instructions (Signed)
Please call our office if you have questions or concerns.   

## 2017-11-07 DIAGNOSIS — Z8709 Personal history of other diseases of the respiratory system: Secondary | ICD-10-CM | POA: Insufficient documentation

## 2017-12-27 ENCOUNTER — Ambulatory Visit: Payer: Medicare Other | Admitting: Urology

## 2018-01-23 ENCOUNTER — Other Ambulatory Visit: Payer: Self-pay

## 2018-01-23 DIAGNOSIS — R972 Elevated prostate specific antigen [PSA]: Secondary | ICD-10-CM

## 2018-01-23 NOTE — Progress Notes (Signed)
01/24/2018 10:48 AM   Tony Mitchell 1941/04/04 [id number]  Referring provider: Tracie Harrier, MD [address] [facility] [place], [place] [address]  Chief Complaint  Patient presents with  . Elevated PSA  . Nocturia    HPI: Patient is a 77 year old Caucasian male with a history of elevated PSA, BPH with LU TS and ED who presents for a month follow up.    History of elevated PSA Referred for an elevated PSA. The patient states that this was noted on a routine annual physical exam.  The patient states that he has had PSA screening for many years. He had a elevated PSA 15 years ago which prompted a prostate biopsy. This was negative. He does not recall any specific numbers although he is been told that they've been normal. His never had an abnormal rectal exam. He has no family history of prostate cancer.  Current PSA is 6.14 on 01/24/2018.    BPH WITH LUTS  (prostate and/or bladder) His IPSS score today is 5, which is mild lower urinary tract symptomatology. He is pleased with his quality life due to his urinary symptoms.  His I PSS score was 5/0.  His major complaints today are intermittency,  nocturia x 2 and weak urinary stream.   He has had these symptoms over the few year.   He denies any dysuria, hematuria or suprapubic pain.   He also denies any recent fevers, chills, nausea or vomiting.   IPSS    Row Name 01/24/18 1000         International Prostate Symptom Score   How often have you had the sensation of not emptying your bladder?  Less than 1 in 5     How often have you had to urinate less than every two hours?  Less than 1 in 5 times     How often have you found you stopped and started again several times when you urinated?  Not at All     How often have you found it difficult to postpone urination?  Not at All     How often have you had a weak urinary stream?  Less than 1 in 5 times     How often have you had to strain to start urination?   Less than 1 in 5 times     How many times did you typically get up at night to urinate?  1 Time     Total IPSS Score  5       Quality of Life due to urinary symptoms   If you were to spend the rest of your life with your urinary condition just the way it is now how would you feel about that?  Pleased        Score:  1-7 Mild 8-19 Moderate 20-35 Severe   Erectile dysfunction His SHIM score is 9, which is moderate ED.   He has been having difficulty with erections for the last few years.   His previous SHIM score was 13.  His major complaint is the firmness of the erections is hit or miss with sildenafil  His libido is preserved   His risk factors for ED are age, BPH, HTN, HLD, sleep apnea, CAD and antidepressants, pain medication.  He denies any painful erections or curvatures with his erections.   He is still having spontaneous erections.  He has tried sildenafil in the past with mixed results.   SHIM  [place] Name 01/24/18 1019         SHIM: Over the last 6 months:   How do you rate your confidence that you could get and keep an erection?  Very Low     When you had erections with sexual stimulation, how often were your erections hard enough for penetration (entering your partner)?  A Few Times (much less than half the time)     During sexual intercourse, how often were you able to maintain your erection after you had penetrated (entered) your partner?  A Few Times (much less than half the time)     During sexual intercourse, how difficult was it to maintain your erection to completion of intercourse?  Very Difficult     When you attempted sexual intercourse, how often was it satisfactory for you?  A Few Times (much less than half the time)       SHIM Total Score   SHIM  9        Score: 1-7 Severe ED 8-11 Moderate ED 12-16 Mild-Moderate ED 17-21 Mild ED 22-25 No ED  PMH: Past Medical History:  Diagnosis Date  . COPD (chronic obstructive pulmonary disease) ([place])   . Coronary  artery disease   . Gout   . Hyperlipidemia   . Hypertension   . Sleep apnea     Surgical History: Past Surgical History:  Procedure Laterality Date  . CATARACT EXTRACTION  2012  . KNEE SURGERY Left 1965    Home Medications:  Allergies as of 01/24/2018   No Known Allergies     Medication List        Accurate as of 01/24/18 10:48 AM. Always use your most recent med list.          albuterol 108 (90 Base) MCG/ACT inhaler Commonly known as:  PROVENTIL HFA;VENTOLIN HFA Inhale 2 puffs into the lungs every 6 (six) hours as needed for wheezing or shortness of breath.   allopurinol 100 MG tablet Commonly known as:  ZYLOPRIM TAKE 1 TABLET BY MOUTH EVERY DAY   ALPRAZolam 0.5 MG tablet Commonly known as:  XANAX Take 0.5 mg by mouth at bedtime as needed for anxiety.   aspirin EC 81 MG tablet Take 81 mg by mouth daily.   cloNIDine 0.3 MG tablet Commonly known as:  CATAPRES Take 0.3 mg by mouth 2 (two) times daily.   cyanocobalamin 1000 MCG tablet Take 1,000 mcg by mouth daily.   FLUZONE HIGH-DOSE 0.5 ML injection Generic drug:  Influenza vac split quadrivalent PF ADM 0.5ML IM UTD   gabapentin 300 MG capsule Commonly known as:  NEURONTIN Take 300 mg by mouth 3 (three) times daily.   hydrALAZINE 50 MG tablet Commonly known as:  APRESOLINE Take 50 mg by mouth 2 (two) times daily.   ibuprofen 800 MG tablet Commonly known as:  ADVIL,MOTRIN TAKE 1 TABLET BY MOUTH EVERY 8 HOURS AS NEEDED FOR PAIN   labetalol 300 MG tablet Commonly known as:  NORMODYNE Take 300 mg by mouth 2 (two) times daily.   lansoprazole 15 MG capsule Commonly known as:  PREVACID Take 15 mg by mouth daily at 12 noon.   lisinopril 40 MG tablet Commonly known as:  PRINIVIL,ZESTRIL TAKE 1 TABLET BY MOUTH EVERY DAY   MEGARED OMEGA-3 KRILL OIL PO Take by mouth.   sildenafil 20 MG tablet Commonly known as:  REVATIO Take 100 mg by mouth daily as needed (erectile dysfunction).   simvastatin 20 MG  tablet Commonly known as:  ZOCOR Take 20 mg by mouth daily.   SYMBICORT 80-4.5 MCG/ACT inhaler Generic drug:  budesonide-formoterol Inhale 2 puffs into the lungs 2 (two) times daily.       Allergies: No Known Allergies  Family History: Family History  Problem Relation Age of Onset  . Cancer Father        Unknown  . Prostate cancer Neg Hx   . Lung cancer Neg Hx     Social History:  reports that he quit smoking about 21 years ago. His smoking use included cigarettes. He has never used smokeless tobacco. He reports that he drinks alcohol. He reports that he does not use drugs.  ROS: UROLOGY Frequent Urination?: No Hard to postpone urination?: No Burning/pain with urination?: No Get up at night to urinate?: Yes Leakage of urine?: No Urine stream starts and stops?: Yes Trouble starting stream?: No Do you have to strain to urinate?: No Blood in urine?: No Urinary tract infection?: No Sexually transmitted disease?: No Injury to kidneys or bladder?: No Painful intercourse?: No Weak stream?: Yes Erection problems?: Yes Penile pain?: No  Gastrointestinal Nausea?: No Vomiting?: No Indigestion/heartburn?: No Diarrhea?: No Constipation?: No  Constitutional Fever: No Night sweats?: No Weight loss?: No Fatigue?: No  Skin Skin rash/lesions?: No Itching?: No  Eyes Blurred vision?: No Double vision?: No  Ears/Nose/Throat Sore throat?: No Sinus problems?: Yes  Hematologic/Lymphatic Swollen glands?: No Easy bruising?: No  Cardiovascular Leg swelling?: No Chest pain?: No  Respiratory Cough?: No Shortness of breath?: Yes  Endocrine Excessive thirst?: No  Musculoskeletal Back pain?: Yes Joint pain?: Yes  Neurological Headaches?: No Dizziness?: No  Psychologic Depression?: No Anxiety?: No  Physical Exam: BP 136/90 (BP Location: Right Arm, Patient Position: Sitting, Cuff Size: Large)   Pulse 80   Ht 5' 9.5" (1.765 m)   Wt 199 lb (90.3 kg)    SpO2 99%   BMI 28.97 kg/m   Constitutional: Well nourished. Alert and oriented, No acute distress. HEENT: [place] AT, moist mucus membranes. Trachea midline, no masses. Cardiovascular: No clubbing, cyanosis, or edema. Respiratory: Normal respiratory effort, no increased work of breathing. GI: Abdomen is soft, non tender, non distended, no abdominal masses. Liver and spleen not palpable.  No hernias appreciated.  Stool sample for occult testing is not indicated.   GU: No CVA tenderness.  No bladder fullness or masses.  Patient with circumcised phallus. Urethral meatus is patent.  No penile discharge. No penile lesions or rashes. Scrotum without lesions, cysts, rashes and/or edema.  Testicles are located scrotally bilaterally. No masses are appreciated in the testicles. Left and right epididymis are normal. Rectal: Patient with  normal sphincter tone. Anus and perineum without scarring or rashes. No rectal masses are appreciated. Prostate is approximately 45 grams, no nodules are appreciated. Seminal vesicles are normal. Skin: No rashes, bruises or suspicious lesions. Lymph: No cervical or inguinal adenopathy. Neurologic: Grossly intact, no focal deficits, moving all 4 extremities. Psychiatric: Normal mood and affect.   Laboratory Data: PSA History  8.2 ng/mL on April 10, 2016  5.7 ng/mL on June 20, 2016  6.6 ng/mL on Dec 20, 2016  5.30 ng/mL on June 20, 2017  6.14 ng/mL on January 23, 2018  I have reviewed the labs.  Assessment & Plan:    1. Elevated PSA Patient's PSA appears to be stable at this time.  We will continue to monitor with PSA and exam q 6 months.   2. BPH with LUTS  - IPSS score is 5/1, it  is stable  - Continue conservative management, avoiding bladder irritants and timed voiding's  - most bothersome symptoms is/are nocturia x 1 and intermittency  - RTC in 6 months for IPSS, PSA and exam   3. Erectile dysfunction SHIM score is 9, it is worse We discussed trying   intracavernous vasoactive drug injection therapy and he would like to try EDEX at this time - script sent to the speciality pharmacy - will contact patient once prescription is approved to schedule a titration appointment  RTC in 6 months for repeat SHIM score and exam    Return in about 6 months (around 07/26/2018) for IPSS, SHIM, PSA and exam.  These notes generated with voice recognition software. I apologize for typographical errors.  Zara Council, PA-C  [facility] [address] [place], [place] [address] [phone]

## 2018-01-24 ENCOUNTER — Other Ambulatory Visit
Admission: RE | Admit: 2018-01-24 | Discharge: 2018-01-24 | Disposition: A | Discharge status: Home / Self Care | Payer: Medicare Other | Source: Ambulatory Visit | Attending: Urology | Admitting: Urology

## 2018-01-24 ENCOUNTER — Ambulatory Visit (INDEPENDENT_AMBULATORY_CARE_PROVIDER_SITE_OTHER): Payer: Medicare Other | Admitting: Urology

## 2018-01-24 ENCOUNTER — Telehealth: Payer: Self-pay | Admitting: Urology

## 2018-01-24 ENCOUNTER — Encounter: Payer: Self-pay | Admitting: Urology

## 2018-01-24 VITALS — BP 136/90 | HR 80 | Ht 69.5 in | Wt 199.0 lb

## 2018-01-24 DIAGNOSIS — N138 Other obstructive and reflux uropathy: Secondary | ICD-10-CM

## 2018-01-24 DIAGNOSIS — R351 Nocturia: Secondary | ICD-10-CM | POA: Diagnosis not present

## 2018-01-24 DIAGNOSIS — R972 Elevated prostate specific antigen [PSA]: Secondary | ICD-10-CM | POA: Insufficient documentation

## 2018-01-24 DIAGNOSIS — N401 Enlarged prostate with lower urinary tract symptoms: Secondary | ICD-10-CM | POA: Diagnosis not present

## 2018-01-24 DIAGNOSIS — N529 Male erectile dysfunction, unspecified: Secondary | ICD-10-CM | POA: Diagnosis not present

## 2018-01-24 LAB — PSA: Prostatic Specific Antigen: 6.14 ng/mL — ABNORMAL HIGH (ref 0.00–4.00)

## 2018-01-24 NOTE — Telephone Encounter (Signed)
Would you please send in a script for EDEX 10 mcg to the speciality pharmacy for the patient?

## 2018-01-29 ENCOUNTER — Telehealth: Payer: Self-pay | Admitting: Family Medicine

## 2018-01-29 NOTE — Telephone Encounter (Signed)
-----   Message from Nori Riis, PA-C sent at 01/25/2018 11:35 AM EDT ----- Please let Mr. Geerts know that his PSA is stable at 6.14.   We will see him in 6 months.  PSA to be drawn before his next appointment.    Any news on his EDEX prescription?

## 2018-01-29 NOTE — Telephone Encounter (Signed)
Patient notified and Edex papers are faxed

## 2018-01-29 NOTE — Telephone Encounter (Signed)
Edex paper faxed

## 2018-02-15 ENCOUNTER — Telehealth: Payer: Self-pay | Admitting: Urology

## 2018-02-15 NOTE — Telephone Encounter (Signed)
Have we heard anything regarding Tony Mitchell EDEX prescription?  Has he decided to get the [place]?

## 2018-02-19 NOTE — Telephone Encounter (Signed)
Pt states he is going to hold off on the edex at this time.

## 2018-02-26 ENCOUNTER — Other Ambulatory Visit: Payer: Self-pay | Admitting: Family Medicine

## 2018-03-03 ENCOUNTER — Emergency Department: Payer: Medicare Other | Admitting: Anesthesiology

## 2018-03-03 ENCOUNTER — Ambulatory Visit
Admit: 2018-03-03 | Discharge: 2018-03-03 | Disposition: A | Discharge status: Home / Self Care | Payer: Medicare Other | Attending: Family Medicine | Admitting: Family Medicine

## 2018-03-03 ENCOUNTER — Observation Stay
Admission: EM | Admit: 2018-03-03 | Discharge: 2018-03-04 | Disposition: A | Discharge status: Home / Self Care | Payer: Medicare Other | Attending: Surgery | Admitting: Surgery

## 2018-03-03 ENCOUNTER — Encounter: Payer: Self-pay | Admitting: Emergency Medicine

## 2018-03-03 ENCOUNTER — Encounter: Payer: Self-pay | Admitting: Anesthesiology

## 2018-03-03 ENCOUNTER — Ambulatory Visit (INDEPENDENT_AMBULATORY_CARE_PROVIDER_SITE_OTHER)
Admission: EM | Admit: 2018-03-03 | Discharge: 2018-03-03 | Disposition: A | Discharge status: Home / Self Care | Payer: Medicare Other | Source: Home / Self Care | Attending: Family Medicine | Admitting: Family Medicine

## 2018-03-03 ENCOUNTER — Other Ambulatory Visit: Payer: Self-pay

## 2018-03-03 ENCOUNTER — Encounter
Admission: EM | Disposition: A | Discharge status: Home / Self Care | Payer: Self-pay | Source: Home / Self Care | Attending: Emergency Medicine

## 2018-03-03 DIAGNOSIS — Z87891 Personal history of nicotine dependence: Secondary | ICD-10-CM | POA: Diagnosis not present

## 2018-03-03 DIAGNOSIS — K3580 Unspecified acute appendicitis: Secondary | ICD-10-CM | POA: Diagnosis present

## 2018-03-03 DIAGNOSIS — E785 Hyperlipidemia, unspecified: Secondary | ICD-10-CM | POA: Insufficient documentation

## 2018-03-03 DIAGNOSIS — Z79899 Other long term (current) drug therapy: Secondary | ICD-10-CM | POA: Diagnosis not present

## 2018-03-03 DIAGNOSIS — I10 Essential (primary) hypertension: Secondary | ICD-10-CM | POA: Insufficient documentation

## 2018-03-03 DIAGNOSIS — I2510 Atherosclerotic heart disease of native coronary artery without angina pectoris: Secondary | ICD-10-CM | POA: Insufficient documentation

## 2018-03-03 DIAGNOSIS — Z7982 Long term (current) use of aspirin: Secondary | ICD-10-CM | POA: Diagnosis not present

## 2018-03-03 DIAGNOSIS — J449 Chronic obstructive pulmonary disease, unspecified: Secondary | ICD-10-CM | POA: Diagnosis not present

## 2018-03-03 DIAGNOSIS — M109 Gout, unspecified: Secondary | ICD-10-CM | POA: Diagnosis not present

## 2018-03-03 DIAGNOSIS — K3531 Acute appendicitis with localized peritonitis and gangrene, without perforation: Secondary | ICD-10-CM | POA: Diagnosis not present

## 2018-03-03 DIAGNOSIS — R1031 Right lower quadrant pain: Secondary | ICD-10-CM

## 2018-03-03 DIAGNOSIS — K3532 Acute appendicitis with perforation and localized peritonitis, without abscess: Secondary | ICD-10-CM | POA: Diagnosis not present

## 2018-03-03 HISTORY — DX: Unspecified acute appendicitis: K35.80

## 2018-03-03 HISTORY — PX: LAPAROSCOPIC APPENDECTOMY: SHX408

## 2018-03-03 HISTORY — DX: Failed or difficult intubation, initial encounter: T88.4XXA

## 2018-03-03 LAB — CBC WITH DIFFERENTIAL/PLATELET
Basophils Absolute: 0 10*3/uL (ref 0–0.1)
Basophils Relative: 1 %
Eosinophils Absolute: 0.1 10*3/uL (ref 0–0.7)
Eosinophils Relative: 1 %
HEMATOCRIT: 37.8 % — AB (ref 40.0–52.0)
HEMOGLOBIN: 12.9 g/dL — AB (ref 13.0–18.0)
LYMPHS ABS: 1.3 10*3/uL (ref 1.0–3.6)
Lymphocytes Relative: 13 %
MCH: 32 pg (ref 26.0–34.0)
MCHC: 34.1 g/dL (ref 32.0–36.0)
MCV: 93.9 fL (ref 80.0–100.0)
MONO ABS: 1 10*3/uL (ref 0.2–1.0)
MONOS PCT: 10 %
NEUTROS ABS: 7.5 10*3/uL — AB (ref 1.4–6.5)
NEUTROS PCT: 75 %
Platelets: 217 10*3/uL (ref 150–440)
RBC: 4.03 MIL/uL — ABNORMAL LOW (ref 4.40–5.90)
RDW: 13.8 % (ref 11.5–14.5)
WBC: 9.9 10*3/uL (ref 3.8–10.6)

## 2018-03-03 LAB — BASIC METABOLIC PANEL
Anion gap: 13 (ref 5–15)
BUN: 13 mg/dL (ref 8–23)
CALCIUM: 8.8 mg/dL — AB (ref 8.9–10.3)
CHLORIDE: 102 mmol/L (ref 98–111)
CO2: 24 mmol/L (ref 22–32)
CREATININE: 1 mg/dL (ref 0.61–1.24)
GFR calc Af Amer: >60 mL/min (ref >60)
GFR calc non Af Amer: >60 mL/min (ref >60)
Glucose, Bld: 145 mg/dL — ABNORMAL HIGH (ref 70–99)
Potassium: 3.8 mmol/L (ref 3.5–5.1)
Sodium: 139 mmol/L (ref 135–145)

## 2018-03-03 LAB — PROTIME-INR
INR: 1.03
PROTHROMBIN TIME: 13.4 s (ref 11.4–15.2)

## 2018-03-03 LAB — TYPE AND SCREEN
ABO/RH(D): O POS
ANTIBODY SCREEN: NEGATIVE

## 2018-03-03 LAB — APTT: aPTT: 30 seconds (ref 24–36)

## 2018-03-03 SURGERY — APPENDECTOMY, LAPAROSCOPIC
Anesthesia: General

## 2018-03-03 MED ORDER — ONDANSETRON 4 MG PO TBDP: 4.0000 mg | ORAL_TABLET | Freq: Four times a day (QID) | ORAL | Status: DC | PRN

## 2018-03-03 MED ORDER — ROCURONIUM BROMIDE 50 MG/5ML IV SOLN
INTRAVENOUS | Status: AC
  Filled 2018-03-03: qty 1

## 2018-03-03 MED ORDER — ONDANSETRON HCL 4 MG/2ML IJ SOLN
INTRAMUSCULAR | Status: AC
  Filled 2018-03-03: qty 2

## 2018-03-03 MED ORDER — DEXAMETHASONE SODIUM PHOSPHATE 10 MG/ML IJ SOLN
INTRAMUSCULAR | Status: AC
  Filled 2018-03-03: qty 1

## 2018-03-03 MED ORDER — PROPOFOL 10 MG/ML IV BOLUS
INTRAVENOUS | Status: AC
  Filled 2018-03-03: qty 20

## 2018-03-03 MED ORDER — SODIUM CHLORIDE 0.9 % IV BOLUS
1000.0000 mL | Freq: Once | INTRAVENOUS | Status: AC
  Administered 2018-03-03: 1000 mL via INTRAVENOUS

## 2018-03-03 MED ORDER — LIDOCAINE HCL (CARDIAC) PF 100 MG/5ML IV SOSY
PREFILLED_SYRINGE | INTRAVENOUS | Status: DC | PRN
  Administered 2018-03-03: 60 mg via INTRAVENOUS

## 2018-03-03 MED ORDER — ROCURONIUM BROMIDE 100 MG/10ML IV SOLN
INTRAVENOUS | Status: DC | PRN
  Administered 2018-03-03: 30 mg via INTRAVENOUS
  Administered 2018-03-03 (×2): 20 mg via INTRAVENOUS

## 2018-03-03 MED ORDER — PIPERACILLIN-TAZOBACTAM 3.375 G IVPB 30 MIN
3.3750 g | Freq: Once | INTRAVENOUS | Status: AC
  Administered 2018-03-03: 3.375 g via INTRAVENOUS
  Filled 2018-03-03: qty 50

## 2018-03-03 MED ORDER — SUGAMMADEX SODIUM 500 MG/5ML IV SOLN
INTRAVENOUS | Status: DC | PRN
  Administered 2018-03-03: 200 mg via INTRAVENOUS

## 2018-03-03 MED ORDER — IPRATROPIUM-ALBUTEROL 0.5-2.5 (3) MG/3ML IN SOLN: 3.0000 mL | Freq: Once | RESPIRATORY_TRACT | Status: DC | PRN

## 2018-03-03 MED ORDER — LIDOCAINE HCL 1 % IJ SOLN
INTRAMUSCULAR | Status: DC | PRN
  Administered 2018-03-03: 20 mL

## 2018-03-03 MED ORDER — ACETAMINOPHEN 500 MG PO TABS
1000.0000 mg | ORAL_TABLET | Freq: Four times a day (QID) | ORAL | Status: DC
  Administered 2018-03-03 – 2018-03-04 (×2): 1000 mg via ORAL
  Filled 2018-03-03 (×2): qty 2

## 2018-03-03 MED ORDER — DEXAMETHASONE SODIUM PHOSPHATE 10 MG/ML IJ SOLN
INTRAMUSCULAR | Status: DC | PRN
  Administered 2018-03-03: 10 mg via INTRAVENOUS

## 2018-03-03 MED ORDER — LIDOCAINE HCL (PF) 1 % IJ SOLN
INTRAMUSCULAR | Status: AC
  Filled 2018-03-03: qty 30

## 2018-03-03 MED ORDER — IOPAMIDOL (ISOVUE-300) INJECTION 61%
100.0000 mL | Freq: Once | INTRAVENOUS | Status: AC | PRN
  Administered 2018-03-03: 100 mL via INTRAVENOUS

## 2018-03-03 MED ORDER — OXYCODONE HCL 5 MG PO TABS
5.0000 mg | ORAL_TABLET | ORAL | Status: DC | PRN
  Administered 2018-03-03: 5 mg via ORAL
  Filled 2018-03-03: qty 1

## 2018-03-03 MED ORDER — OXYCODONE HCL 5 MG/5ML PO SOLN: 5.0000 mg | Freq: Once | ORAL | Status: DC | PRN

## 2018-03-03 MED ORDER — FENTANYL CITRATE (PF) 100 MCG/2ML IJ SOLN
INTRAMUSCULAR | Status: AC
  Filled 2018-03-03: qty 2

## 2018-03-03 MED ORDER — MORPHINE SULFATE (PF) 2 MG/ML IV SOLN: 2.0000 mg | INTRAVENOUS | Status: DC | PRN

## 2018-03-03 MED ORDER — FENTANYL CITRATE (PF) 100 MCG/2ML IJ SOLN
INTRAMUSCULAR | Status: DC | PRN
  Administered 2018-03-03: 50 ug via INTRAVENOUS
  Administered 2018-03-03: 25 ug via INTRAVENOUS
  Administered 2018-03-03: 50 ug via INTRAVENOUS
  Administered 2018-03-03: 25 ug via INTRAVENOUS
  Administered 2018-03-03: 50 ug via INTRAVENOUS

## 2018-03-03 MED ORDER — OXYCODONE HCL 5 MG PO TABS: 5.0000 mg | ORAL_TABLET | Freq: Once | ORAL | Status: DC | PRN

## 2018-03-03 MED ORDER — ONDANSETRON HCL 4 MG/2ML IJ SOLN
4.0000 mg | Freq: Four times a day (QID) | INTRAMUSCULAR | Status: DC | PRN
  Administered 2018-03-04: 4 mg via INTRAVENOUS
  Filled 2018-03-03: qty 2

## 2018-03-03 MED ORDER — SUCCINYLCHOLINE CHLORIDE 20 MG/ML IJ SOLN
INTRAMUSCULAR | Status: AC
  Filled 2018-03-03: qty 1

## 2018-03-03 MED ORDER — BUPIVACAINE HCL (PF) 0.5 % IJ SOLN
INTRAMUSCULAR | Status: AC
  Filled 2018-03-03: qty 30

## 2018-03-03 MED ORDER — LACTATED RINGERS IV SOLN
INTRAVENOUS | Status: DC | PRN
  Administered 2018-03-03: 20:00:00 via INTRAVENOUS

## 2018-03-03 MED ORDER — ENOXAPARIN SODIUM 40 MG/0.4ML ~~LOC~~ SOLN
40.0000 mg | SUBCUTANEOUS | Status: DC
  Administered 2018-03-04: 40 mg via SUBCUTANEOUS
  Filled 2018-03-03: qty 0.4

## 2018-03-03 MED ORDER — KCL IN DEXTROSE-NACL 20-5-0.45 MEQ/L-%-% IV SOLN
INTRAVENOUS | Status: DC
  Administered 2018-03-04: 01:00:00 via INTRAVENOUS
  Filled 2018-03-03 (×3): qty 1000

## 2018-03-03 MED ORDER — LIDOCAINE HCL (PF) 2 % IJ SOLN
INTRAMUSCULAR | Status: AC
  Filled 2018-03-03: qty 10

## 2018-03-03 MED ORDER — FENTANYL CITRATE (PF) 100 MCG/2ML IJ SOLN: 25.0000 ug | INTRAMUSCULAR | Status: DC | PRN

## 2018-03-03 MED ORDER — PIPERACILLIN-TAZOBACTAM 3.375 G IVPB
3.3750 g | Freq: Three times a day (TID) | INTRAVENOUS | Status: DC
  Administered 2018-03-04: 3.375 g via INTRAVENOUS
  Filled 2018-03-03: qty 50

## 2018-03-03 MED ORDER — KETOROLAC TROMETHAMINE 15 MG/ML IJ SOLN: 15.0000 mg | Freq: Four times a day (QID) | INTRAMUSCULAR | Status: DC | PRN

## 2018-03-03 SURGICAL SUPPLY — 40 items
APPLICATOR ARISTA FLEXITIP XL (MISCELLANEOUS) ×3 IMPLANT
BLADE SURG SZ11 CARB STEEL (BLADE) ×3 IMPLANT
CANISTER SUCT 3000ML PPV (MISCELLANEOUS) ×3 IMPLANT
CHLORAPREP W/TINT 26ML (MISCELLANEOUS) ×3 IMPLANT
CUTTER FLEX LINEAR 45M (STAPLE) ×3 IMPLANT
DECANTER SPIKE VIAL GLASS SM (MISCELLANEOUS) ×3 IMPLANT
DERMABOND ADVANCED (GAUZE/BANDAGES/DRESSINGS) ×2
DERMABOND ADVANCED .7 DNX12 (GAUZE/BANDAGES/DRESSINGS) ×1 IMPLANT
ELECT REM PT RETURN 9FT ADLT (ELECTROSURGICAL) ×3
ELECTRODE REM PT RTRN 9FT ADLT (ELECTROSURGICAL) ×1 IMPLANT
GLOVE BIO SURGEON STRL SZ7 (GLOVE) ×6 IMPLANT
GLOVE BIOGEL PI IND STRL 7.5 (GLOVE) ×2 IMPLANT
GLOVE BIOGEL PI INDICATOR 7.5 (GLOVE) ×4
GOWN STRL REUS W/ TWL LRG LVL4 (GOWN DISPOSABLE) ×2 IMPLANT
GOWN STRL REUS W/ TWL XL LVL3 (GOWN DISPOSABLE) IMPLANT
GOWN STRL REUS W/TWL LRG LVL4 (GOWN DISPOSABLE) ×4
GOWN STRL REUS W/TWL XL LVL3 (GOWN DISPOSABLE)
GRASPER SUT TROCAR 14GX15 (MISCELLANEOUS) ×3 IMPLANT
HEMOSTAT ARISTA ABSORB 3G PWDR (MISCELLANEOUS) ×3 IMPLANT
IRRIGATION STRYKERFLOW (MISCELLANEOUS) ×1 IMPLANT
IRRIGATOR STRYKERFLOW (MISCELLANEOUS) ×3
KIT TURNOVER KIT A (KITS) ×3 IMPLANT
NEEDLE HYPO 22GX1.5 SAFETY (NEEDLE) ×3 IMPLANT
NEEDLE INSUFFLATION 14GA 120MM (NEEDLE) ×3 IMPLANT
NS IRRIG 1000ML POUR BTL (IV SOLUTION) ×3 IMPLANT
PACK LAP CHOLECYSTECTOMY (MISCELLANEOUS) ×3 IMPLANT
POUCH SPECIMEN RETRIEVAL 10MM (ENDOMECHANICALS) ×3 IMPLANT
RELOAD 45 VASCULAR/THIN (ENDOMECHANICALS) IMPLANT
RELOAD STAPLE TA45 3.5 REG BLU (ENDOMECHANICALS) ×3 IMPLANT
SHEARS HARMONIC ACE PLUS 36CM (ENDOMECHANICALS) ×3 IMPLANT
SLEEVE ENDOPATH XCEL 5M (ENDOMECHANICALS) ×3 IMPLANT
SOL .9 NS 3000ML IRR  AL (IV SOLUTION) ×2
SOL .9 NS 3000ML IRR UROMATIC (IV SOLUTION) ×1 IMPLANT
SUT MNCRL AB 4-0 PS2 18 (SUTURE) ×3 IMPLANT
SUT VICRYL 0 UR6 27IN ABS (SUTURE) ×3 IMPLANT
SUT VICRYL AB 3-0 FS1 BRD 27IN (SUTURE) ×3 IMPLANT
TRAY FOLEY MTR SLVR 16FR STAT (SET/KITS/TRAYS/PACK) ×3 IMPLANT
TROCAR XCEL 12X100 BLDLESS (ENDOMECHANICALS) ×3 IMPLANT
TROCAR XCEL NON-BLD 5MMX100MML (ENDOMECHANICALS) ×3 IMPLANT
TUBING INSUFFLATION (TUBING) ×3 IMPLANT

## 2018-03-03 NOTE — Discharge Instructions (Addendum)
Go directly to Emergency room.  °

## 2018-03-03 NOTE — ED Notes (Signed)
Pt presents with RLQ abdominal pain since yesterday afternoon. He feels bloated and like his abdomen is "tight." He went to [facility] this morning and had blood work and CT scan, which revealed he has appendicitis and was told to come directly to the hospital. Pt alert & oriented with NAD noted.

## 2018-03-03 NOTE — ED Provider Notes (Signed)
[facility] Emergency Department Provider Note  ____________________________________________  Time seen: Approximately 5:38 PM  I have reviewed the triage vital signs and the nursing notes.   HISTORY  Chief Complaint Abdominal Pain    HPI Tony Mitchell is a 77 y.o. male with a history of COPD hypertension hyperlipidemia who complains of gradual onset right lower quadrant abdominal pain that started yesterday, mid day, constant and worsening through today.  Worse with movement.  No nausea or vomiting.  Normal BMs.  Nonradiating.  Moderate to severe and sharp.  Went to urgent care initially, labs and CT abdomen were performed, labs were unremarkable.  CT shows acute appendicitis with 13 mm appendix and phlegmonous changes.      Past Medical History:  Diagnosis Date  . COPD (chronic obstructive pulmonary disease) ([place])   . Coronary artery disease   . Gout   . Hyperlipidemia   . Hypertension   . Sleep apnea      Patient Active Problem List   Diagnosis Date Noted  . History of pneumothorax 11/07/2017  . Pneumothorax 09/18/2017  . Thoracic aortic aneurysm without rupture ([place]) 08/16/2017  . Primary osteoarthritis of right knee 07/10/2017  . Spinal stenosis of lumbar region with radiculopathy 07/10/2017  . GERD (gastroesophageal reflux disease) 06/13/2016  . Gout 06/13/2016  . Hyperlipidemia, unspecified 06/13/2016  . Hypertension 06/13/2016  . Pneumothorax on right 06/03/2016  . Elevated PSA 03/29/2016  . Elevated blood sugar 09/29/2015  . Rash 02/17/2015  . OSA (obstructive sleep apnea) 07/08/2014  . S/P cardiac catheterization 05/28/2014  . SOB (shortness of breath) on exertion 05/28/2014     Past Surgical History:  Procedure Laterality Date  . CATARACT EXTRACTION  2012  . KNEE SURGERY Left 1965     Prior to Admission medications   Medication Sig Start Date End Date Taking? Authorizing Provider  albuterol (PROVENTIL HFA;VENTOLIN  HFA) 108 (90 Base) MCG/ACT inhaler Inhale 2 puffs into the lungs every 6 (six) hours as needed for wheezing or shortness of breath. 08/08/17   Arta Silence, MD  allopurinol (ZYLOPRIM) 100 MG tablet TAKE 1 TABLET BY MOUTH EVERY DAY 06/17/17   [provider]  ALPRAZolam Duanne Moron) 0.5 MG tablet Take 0.5 mg by mouth at bedtime as needed for anxiety.    [provider]  aspirin EC 81 MG tablet Take 81 mg by mouth daily.    [provider]  cloNIDine (CATAPRES) 0.3 MG tablet Take 0.3 mg by mouth 2 (two) times daily.     [provider]  cyanocobalamin 1000 MCG tablet Take 1,000 mcg by mouth daily.    [provider]  FLUZONE HIGH-DOSE 0.5 ML injection ADM 0.5ML IM UTD 07/12/17   [provider]  gabapentin (NEURONTIN) 300 MG capsule Take 300 mg by mouth 3 (three) times daily.    [provider]  hydrALAZINE (APRESOLINE) 50 MG tablet Take 50 mg by mouth 2 (two) times daily.     [provider]  ibuprofen (ADVIL,MOTRIN) 800 MG tablet TAKE 1 TABLET BY MOUTH EVERY 8 HOURS AS NEEDED FOR PAIN 11/12/17   [provider]  labetalol (NORMODYNE) 300 MG tablet Take 300 mg by mouth 2 (two) times daily.    [provider]  lansoprazole (PREVACID) 15 MG capsule Take 15 mg by mouth daily at 12 noon.    [provider]  lisinopril (PRINIVIL,ZESTRIL) 40 MG tablet TAKE 1 TABLET BY MOUTH EVERY DAY 06/18/16   [provider]  [facility]  OMEGA-3 KRILL OIL PO Take by mouth.    [provider]  sildenafil (REVATIO) 20 MG tablet Take 100 mg by mouth daily as needed (erectile dysfunction).     [provider]  simvastatin (ZOCOR) 20 MG tablet Take 20 mg by mouth daily.  04/04/17 04/04/18  [provider]  SYMBICORT 80-4.5 MCG/ACT inhaler Inhale 2 puffs into the lungs 2 (two) times daily. 09/11/17   [provider]     Allergies Patient has no known allergies.   Family History   Problem Relation Age of Onset  . Cancer Father        Unknown  . Prostate cancer Neg Hx   . Lung cancer Neg Hx     Social History Social History   Tobacco Use  . Smoking status: Former Smoker    Types: Cigarettes    Last attempt to quit: 06/15/1996    Years since quitting: 21.7  . Smokeless tobacco: Never Used  Substance Use Topics  . Alcohol use: Yes    Comment: 1 mixed drink daily  . Drug use: No    Review of Systems  Constitutional:   No fever or chills.  ENT:   No sore throat. No rhinorrhea. Cardiovascular:   No chest pain or syncope. Respiratory:   No dyspnea or cough. Gastrointestinal: Positive as above for abdominal pain without vomiting and diarrhea.  Musculoskeletal:   Negative for focal pain or swelling All other systems reviewed and are negative except as documented above in ROS and HPI.  ____________________________________________   PHYSICAL EXAM:  VITAL SIGNS: ED Triage Vitals  Enc Vitals Group     BP --      Pulse Rate 03/03/18 1517 95     Resp --      Temp 03/03/18 1517 98.4 F (36.9 C)     Temp src --      SpO2 03/03/18 1516 100 %     Weight 03/03/18 1518 190 lb (86.2 kg)     Height 03/03/18 1518 5\' 9"  (1.753 m)     Head Circumference --      Peak Flow --      Pain Score 03/03/18 1517 8     Pain Loc --      Pain Edu? --      Excl. in [place]? --     Vital signs reviewed, nursing assessments reviewed.   Constitutional:   Alert and oriented. Non-toxic appearance. Eyes:   Conjunctivae are normal. EOMI. PERRL. ENT      Head:   Normocephalic and atraumatic.      Nose:   No congestion/rhinnorhea.       Mouth/Throat:   MMM, no pharyngeal erythema. No peritonsillar mass.       Neck:   No meningismus. Full ROM. Hematological/Lymphatic/Immunilogical:   No cervical lymphadenopathy. Cardiovascular:   RRR. Symmetric bilateral radial and DP pulses.  No murmurs. Cap refill less than 2 seconds. Respiratory:   Normal respiratory effort without  tachypnea/retractions. Breath sounds are clear and equal bilaterally. No wheezes/rales/rhonchi. Gastrointestinal:   Soft with focal right lower quadrant tenderness. Non distended. There is no CVA tenderness.  No rebound, rigidity, or guarding. Musculoskeletal:   Normal range of motion in all extremities. No joint effusions.  No lower extremity tenderness.  No edema. Neurologic:   Normal speech and language.  Motor grossly intact. No acute focal neurologic deficits are appreciated.  Skin:    Skin is warm, dry and intact. No rash noted.  No  petechiae, purpura, or bullae.  ____________________________________________    LABS (pertinent positives/negatives) (all labs ordered are listed, but only abnormal results are displayed) Labs Reviewed  APTT  PROTIME-INR  TYPE AND SCREEN   ____________________________________________   EKG    ____________________________________________    RADIOLOGY  Ct Abdomen Pelvis W Contrast  Result Date: 03/03/2018 CLINICAL DATA:  Right lower quadrant pain beginning yesterday, progressively worsening. Fever. EXAM: CT ABDOMEN AND PELVIS WITH CONTRAST TECHNIQUE: Multidetector CT imaging of the abdomen and pelvis was performed using the standard protocol following bolus administration of intravenous contrast. CONTRAST:  117mL ISOVUE-300 IOPAMIDOL (ISOVUE-300) INJECTION 61% COMPARISON:  None. FINDINGS: Lower chest: No acute abnormality. Hepatobiliary: Decreased attenuation of the liver consistent with fatty infiltration. No mass or focal lesion. Normal gallbladder. No bile duct dilation Pancreas: Unremarkable. No pancreatic ductal dilatation or surrounding inflammatory changes. Spleen: Normal in size without focal abnormality. Adrenals/Urinary Tract: Adrenal glands are unremarkable. Kidneys are normal, without renal calculi, focal lesion, or hydronephrosis. Bladder is unremarkable. Stomach/Bowel: Appendix is dilated to 13 mm. There is surrounding phlegm a tori  stranding. No extraluminal air. No periappendiceal fluid collection is seen to suggest an abscess. Stomach, small bowel and colon are unremarkable. Vascular/Lymphatic: Atherosclerotic calcifications are noted throughout a normal caliber abdominal aorta. No pathologically enlarged lymph nodes. Reproductive: Mild enlargement of prostate gland measuring 4.5 cm transversely. Other: No hernia.  No ascites. Musculoskeletal: No fracture or acute finding. No osteoblastic or osteolytic lesions. IMPRESSION: 1. Acute appendicitis. No extraluminal air. No periappendiceal abscess. 2. No other acute findings. 3. Hepatic steatosis. 4. Aortic atherosclerosis. Electronically Signed   By: Lajean Manes M.D.   On: 03/03/2018 14:17    ____________________________________________   PROCEDURES Procedures  ____________________________________________    CLINICAL IMPRESSION / ASSESSMENT AND PLAN / ED COURSE  Pertinent labs & imaging results that were available during my care of the patient were reviewed by me and considered in my medical decision making (see chart for details).    Patient presents with unremarkable vital signs, symptoms and exam concerning with appendicitis, confirmed by recent CT scan that was obtained on an outpatient basis.  I will give IV Zosyn.  I discussed the case with general surgery who will evaluate the patient.  Offered the patient pain medicine and nausea medicine which she declines at this time.  I will give IV fluids for hydration.  N.p.o. since last night.  Patient denies history of heart or lung disease, but chart states COPD.  Does have a history of right lower lung pneumothorax, spontaneous      ____________________________________________   FINAL CLINICAL IMPRESSION(S) / ED DIAGNOSES    Final diagnoses:  Right lower quadrant abdominal pain  Acute appendicitis, unspecified acute appendicitis type     ED Discharge Orders    None      Portions of this note were  generated with dragon dictation software. Dictation errors may occur despite best attempts at proofreading.    Carrie Mew, MD 03/03/18 [phone]

## 2018-03-03 NOTE — ED Provider Notes (Signed)
[facility] ____________________________________________  Time seen: Approximately 12:26 PM  I have reviewed the triage vital signs and the nursing notes.   HISTORY  Chief Complaint Abdominal Pain   HPI Tony SCHLABACH is a 77 y.o. male presenting with spouse at bedside for evaluation of abdominal pain present since yesterday evening around 4:00.  States pain initially started mid abdomen and felt bloated and somewhat distended, but reports overall is doing okay.  States that when he went to bed last night he later got up to go to the restroom and states that he did have overall normal bowel movement with normal color.  States he then went back to bed and was awoken by the abdominal pain.  States abdominal pain remained somewhat in the mid, but ended up having sharp stabbing severe pain to right lower abdomen.  States currently right lower abdomen pain continues, but has slightly eased up. No pain radiation. Normal urination.  No alleviating measures attempted.  Has not eaten any food today, did have some fluids.  No previous abdominal surgeries.  No recurring abdominal issues in the past.  States last night did have chills, no known fevers.  No over-the-counter medications taken for the same complaints.  Denies atypical foods, trauma.  Denies known trigger.  Reports otherwise feels well.  Denies chest pain, shortness of breath, dysuria, extremity pain, extremity swelling or rash. Denies recent sickness. Denies recent antibiotic use.  Denies renal insufficiency.  Denies cardiac issues.  Tracie Harrier, MD: PCP   Past Medical History:  Diagnosis Date  . COPD (chronic obstructive pulmonary disease) ([place])   . Coronary artery disease   . Gout   . Hyperlipidemia   . Hypertension   . Sleep apnea     Patient Active Problem List   Diagnosis Date Noted  . History of pneumothorax 11/07/2017  . Pneumothorax 09/18/2017  . Thoracic aortic aneurysm without rupture ([place])  08/16/2017  . Primary osteoarthritis of right knee 07/10/2017  . Spinal stenosis of lumbar region with radiculopathy 07/10/2017  . GERD (gastroesophageal reflux disease) 06/13/2016  . Gout 06/13/2016  . Hyperlipidemia, unspecified 06/13/2016  . Hypertension 06/13/2016  . Pneumothorax on right 06/03/2016  . Elevated PSA 03/29/2016  . Elevated blood sugar 09/29/2015  . Rash 02/17/2015  . OSA (obstructive sleep apnea) 07/08/2014  . S/P cardiac catheterization 05/28/2014  . SOB (shortness of breath) on exertion 05/28/2014    Past Surgical History:  Procedure Laterality Date  . CATARACT EXTRACTION  2012  . KNEE SURGERY Left 1965     No current facility-administered medications for this encounter.   Current Outpatient Medications:  .  allopurinol (ZYLOPRIM) 100 MG tablet, TAKE 1 TABLET BY MOUTH EVERY DAY, Disp: , Rfl:  .  ALPRAZolam (XANAX) 0.5 MG tablet, Take 0.5 mg by mouth at bedtime as needed for anxiety., Disp: , Rfl:  .  aspirin EC 81 MG tablet, Take 81 mg by mouth daily., Disp: , Rfl:  .  cloNIDine (CATAPRES) 0.3 MG tablet, Take 0.3 mg by mouth 2 (two) times daily. , Disp: , Rfl:  .  cyanocobalamin 1000 MCG tablet, Take 1,000 mcg by mouth daily., Disp: , Rfl:  .  gabapentin (NEURONTIN) 300 MG capsule, Take 300 mg by mouth 3 (three) times daily., Disp: , Rfl:  .  hydrALAZINE (APRESOLINE) 50 MG tablet, Take 50 mg by mouth 2 (two) times daily. , Disp: , Rfl:  .  ibuprofen (ADVIL,MOTRIN) 800 MG tablet, TAKE 1 TABLET BY MOUTH EVERY 8 HOURS  AS NEEDED FOR PAIN, Disp: , Rfl:  .  labetalol (NORMODYNE) 300 MG tablet, Take 300 mg by mouth 2 (two) times daily., Disp: , Rfl:  .  lansoprazole (PREVACID) 15 MG capsule, Take 15 mg by mouth daily at 12 noon., Disp: , Rfl:  .  lisinopril (PRINIVIL,ZESTRIL) 40 MG tablet, TAKE 1 TABLET BY MOUTH EVERY DAY, Disp: , Rfl:  .  MEGARED OMEGA-3 KRILL OIL PO, Take by mouth., Disp: , Rfl:  .  simvastatin (ZOCOR) 20 MG tablet, Take 20 mg by mouth daily. ,  Disp: , Rfl:  .  SYMBICORT 80-4.5 MCG/ACT inhaler, Inhale 2 puffs into the lungs 2 (two) times daily., Disp: , Rfl: 5 .  albuterol (PROVENTIL HFA;VENTOLIN HFA) 108 (90 Base) MCG/ACT inhaler, Inhale 2 puffs into the lungs every 6 (six) hours as needed for wheezing or shortness of breath., Disp: 1 Inhaler, Rfl: 2 .  FLUZONE HIGH-DOSE 0.5 ML injection, ADM 0.5ML IM UTD, Disp: , Rfl: 0 .  sildenafil (REVATIO) 20 MG tablet, Take 100 mg by mouth daily as needed (erectile dysfunction). , Disp: , Rfl:   Allergies Patient has no known allergies.  Family History  Problem Relation Age of Onset  . Cancer Father        Unknown  . Prostate cancer Neg Hx   . Lung cancer Neg Hx     Social History Social History   Tobacco Use  . Smoking status: Former Smoker    Types: Cigarettes    Last attempt to quit: 06/15/1996    Years since quitting: 21.7  . Smokeless tobacco: Never Used  Substance Use Topics  . Alcohol use: Yes    Comment: 1 mixed drink daily  . Drug use: No    Review of Systems Constitutional: No fever/chills Cardiovascular: Denies chest pain. Respiratory: Denies shortness of breath. Gastrointestinal: AS above.   Some nausea, no vomiting.  No diarrhea.  No constipation. Genitourinary: Negative for dysuria. Musculoskeletal: Negative for back pain. Skin: Negative for rash.   ____________________________________________   PHYSICAL EXAM:  VITAL SIGNS: ED Triage Vitals  Enc Vitals Group     BP 03/03/18 1052 (!) 168/101     Pulse Rate 03/03/18 1052 79     Resp 03/03/18 1052 16     Temp 03/03/18 1052 98.3 F (36.8 C)     Temp Source 03/03/18 1052 Oral     SpO2 03/03/18 1052 97 %     Weight 03/03/18 1051 190 lb (86.2 kg)     Height 03/03/18 1051 5' 9.5" (1.765 m)     Head Circumference --      Peak Flow --      Pain Score 03/03/18 1051 5     Pain Loc --      Pain Edu? --      Excl. in [place]? --     Constitutional: Alert and oriented. Well appearing and in no acute  distress. ENT      Head: Normocephalic and atraumatic. Cardiovascular: Normal rate, regular rhythm. Grossly normal heart sounds.  Good peripheral circulation. Respiratory: Normal respiratory effort without tachypnea nor retractions. Breath sounds are clear and equal bilaterally. No wheezes, rales, rhonchi. Gastrointestinal: Normal Bowel sounds.  Distended abdomen.  Minimal right upper quadrant abdominal tenderness.  Moderate right lower quadrant tenderness.  Negative heel tap.  Negative obturator sign.  No CVA tenderness. Musculoskeletal:No midline cervical, thoracic or lumbar tenderness to palpation. Neurologic:  Normal speech and language. Speech is normal. No gait instability.  Skin:  Skin is warm, dry and intact. No rash noted. Psychiatric: Mood and affect are normal. Speech and behavior are normal. Patient exhibits appropriate insight and judgment   ___________________________________________   LABS (all labs ordered are listed, but only abnormal results are displayed)  Labs Reviewed  CBC WITH DIFFERENTIAL/PLATELET - Abnormal; Notable for the following components:      Result Value   RBC 4.03 (*)    Hemoglobin 12.9 (*)    HCT 37.8 (*)    Neutro Abs 7.5 (*)    All other components within normal limits  BASIC METABOLIC PANEL - Abnormal; Notable for the following components:   Glucose, Bld 145 (*)    Calcium 8.8 (*)    All other components within normal limits   ________________________________________  RADIOLOGY  Ct Abdomen Pelvis W Contrast  Result Date: 03/03/2018 CLINICAL DATA:  Right lower quadrant pain beginning yesterday, progressively worsening. Fever. EXAM: CT ABDOMEN AND PELVIS WITH CONTRAST TECHNIQUE: Multidetector CT imaging of the abdomen and pelvis was performed using the standard protocol following bolus administration of intravenous contrast. CONTRAST:  162mL ISOVUE-300 IOPAMIDOL (ISOVUE-300) INJECTION 61% COMPARISON:  None. FINDINGS: Lower chest: No acute  abnormality. Hepatobiliary: Decreased attenuation of the liver consistent with fatty infiltration. No mass or focal lesion. Normal gallbladder. No bile duct dilation Pancreas: Unremarkable. No pancreatic ductal dilatation or surrounding inflammatory changes. Spleen: Normal in size without focal abnormality. Adrenals/Urinary Tract: Adrenal glands are unremarkable. Kidneys are normal, without renal calculi, focal lesion, or hydronephrosis. Bladder is unremarkable. Stomach/Bowel: Appendix is dilated to 13 mm. There is surrounding phlegm a tori stranding. No extraluminal air. No periappendiceal fluid collection is seen to suggest an abscess. Stomach, small bowel and colon are unremarkable. Vascular/Lymphatic: Atherosclerotic calcifications are noted throughout a normal caliber abdominal aorta. No pathologically enlarged lymph nodes. Reproductive: Mild enlargement of prostate gland measuring 4.5 cm transversely. Other: No hernia.  No ascites. Musculoskeletal: No fracture or acute finding. No osteoblastic or osteolytic lesions. IMPRESSION: 1. Acute appendicitis. No extraluminal air. No periappendiceal abscess. 2. No other acute findings. 3. Hepatic steatosis. 4. Aortic atherosclerosis. Electronically Signed   By: Lajean Manes M.D.   On: 03/03/2018 14:17   ____________________________________________   PROCEDURES Procedures    INITIAL IMPRESSION / ASSESSMENT AND PLAN / ED COURSE  Pertinent labs & imaging results that were available during my care of the patient were reviewed by me and considered in my medical decision making (see chart for details).  Overall well-appearing patient.  No acute distress.  CBC and BMP reviewed.  Awaiting CT.  CT abdomen pelvis positive for acute appendicitis.  Referring patient to ER, patient [facility]  Reports his wife will take him.  Lives RN charge nurse at [facility] called and given report.  Patient stable at time of discharge.  Patient  verbalized understanding agreed to plan. ____________________________________________   FINAL CLINICAL IMPRESSION(S) / ED DIAGNOSES  Final diagnoses:  Acute appendicitis, unspecified acute appendicitis type     ED Discharge Orders    None       Note: This dictation was prepared with Dragon dictation along with smaller phrase technology. Any transcriptional errors that result from this process are unintentional.         Marylene Land, NP 03/03/18 1506

## 2018-03-03 NOTE — Op Note (Signed)
SURGICAL OPERATIVE REPORT  DATE OF PROCEDURE: 03/03/2018  ATTENDING Surgeon(s): Vickie Epley, MD  ANESTHESIA: GETA (General)  PRE-OPERATIVE DIAGNOSIS: Acute non-perforated appendicitis with localized peritonitis (K35.31)  POST-OPERATIVE DIAGNOSIS: Acute non-perforated gangrenous appendicitis with localized peritonitis (K35.31)  PROCEDURE(S):  1.) Laparoscopic appendectomy (cpt: 32202)  INTRAOPERATIVE FINDINGS: Severely inflamed and gangrenous non-perforated appendix  INTRAVENOUS FLUIDS: 800 mL crystalloid   ESTIMATED BLOOD LOSS: 30 mL  URINE OUTPUT: 200 mL   SPECIMENS: Appendix  IMPLANTS: None  DRAINS: None  COMPLICATIONS: None apparent  CONDITION AT END OF PROCEDURE: Hemodynamically stable and extubated  DISPOSITION OF PATIENT: PACU  INDICATIONS FOR PROCEDURE:  Patient is a 77 y.o. male who presented with acute onset of epigastric abdominal pain that progressed to become greatest at his Right lower quadrant. CT revealed acute appendicitis. Patient reported his pain was well-controlled in the Emergency Department. All risks, benefits, and alternatives to appendectomy were discussed with the patient, all of patient's questions were answered to his expressed satisfaction, and informed consent was obtained and documented accordingly at that time.  DETAILS OF PROCEDURE: Patient was brought to the operating suite and appropriately identified. General anesthesia was administered along with confirmation of appropriate pre-operative antibiotics, and endotracheal intubation was performed by anesthetist, along with NG/OG tube for gastric decompression. In supine position, operative site was prepped and draped in usual sterile fashion, and following a brief time out, initial 5 mm incision was made in a natural skin crease just below the umbilicus. Fascia was then elevated, and a Verress needle was inserted and its proper position confirmed using saline meniscus test prior to  abdominal insufflation.  Upon insufflation of the abdominal cavity with carbon dioxide to a well-tolerated pressure of 12-15 mmHg, a 5 mm peri-umbilical port followed by laparoscope were inserted and used to inspect the abdominal cavity and its contents with no injuries from insertion of the first trochar noted. Two additional trocars were inserted, a 12 mm port at the Left lower quadrant position and another 5 mm port at the suprapubic position. The table was then placed in Trendelenburg position with the Right side up, and blunt graspers were gently used to retract the bowel in the vicinity of an inflamed appendix on CT imaging. However, as demonstrated on CT imaging, the appendix was somewhat retrocecal and was quite fixed and difficult both to identify and to mobilize, particularly at the tip of the inflamed appendix. Even once identified and partially mobilized at its base and mid-appendix remained fixed retrocecally. After gaining hemostasis at the mesoappendix using the harmonic scalpel, an endostapler loaded with a standard blue tissue load was advanced across the base of the visceral appendix, which was compressed for several seconds, and the stapler was deployed and removed from the abdominal cavity. Harmonic scalpel was then utilized to complete dissection and hemostatic control of the mesoappendix before the tip and remainder of the quite severely inflamed appendiceal tip was able to be freed, hemostasis was once more confirmed, and the specimen was extracted from the abdominal cavity in a laparoscopic specimen bag.  The intraperitoneal cavity was inspected with no additional findings. PMI laparoscopic fascial closure device was then used to re-approximate fascia at the 12 mm Left lower quadrant port site. All ports were then removed under direct visualization, and the abdominal cavity was desuflated. All port sites were irrigated/cleaned, additional local anesthetic was injected at each incision,  3-0 Vicryl was used to re-approximate dermis at 10/12 mm port site(s), and subcuticular 4-0 Monocryl suture was used to  re-approximate skin. Skin was then cleaned, dried, and sterile skin glue was applied. Patient was then safely able to be awakened, extubated, and transferred to PACU for post-operative monitoring and care.   I was present for all aspects of the above procedure, and no operative complications were apparent.

## 2018-03-03 NOTE — Anesthesia Procedure Notes (Signed)
Procedure Name: Intubation Date/Time: 03/03/2018 7:54 PM Performed by: Lendon Colonel, CRNA Pre-anesthesia Checklist: Patient identified, Patient being monitored, Timeout performed, Emergency Drugs available and Suction available Patient Re-evaluated:Patient Re-evaluated prior to induction Oxygen Delivery Method: Circle system utilized Preoxygenation: Pre-oxygenation with 100% oxygen Induction Type: IV induction and Rapid sequence Laryngoscope Size: McGraph and 3 Grade View: Grade II Tube type: Oral Tube size: 7.0 mm Number of attempts: 1 Airway Equipment and Method: Stylet Placement Confirmation: ETT inserted through vocal cords under direct vision,  positive ETCO2 and breath sounds checked- equal and bilateral Secured at: 21 cm Tube secured with: Tape Dental Injury: Teeth and Oropharynx as per pre-operative assessment  Difficulty Due To: Difficult Airway- due to anterior larynx and Difficulty was anticipated

## 2018-03-03 NOTE — ED Triage Notes (Signed)
Pt presents today for abd pain that started yesterday afternoon around 3-4  And has gotten worse as tie gone forward. Pt states his abd  Is "hard and round" and hurts sharp pain in the LRQ. BM was yesterday.Marland Kitchen

## 2018-03-03 NOTE — Consult Note (Signed)
SURGICAL CONSULTATION NOTE (initial) - cpt: 77412  HISTORY OF PRESENT ILLNESS (HPI):  77 y.o. male presented to [facility] ED today for evaluation of abdominal pain. Patient reports he first noticed mild supra-umbilical abdominal pain yesterday afternoon after finishing his birthday celebration brunch. Throughout the day and evening his pain became more "uncomfortable" with mild nausea without emesis. Throughout the night, patient passed 2 "normal" BM's, after each of which patient says he felt better. However, he also started to notice that his pain became more focused in his RLQ with subjective chills without measured fever. Having read online that his symptoms were consistent with appendicitis or bowel obstruction, he presented to [facility], where he was referred for CT Abdomen and Pelvis and then to [facility] ED. While in the ED, patient says his pain has been well controlled. He has not eaten anything since yesterday. 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 He also experienced spontaneous pneumothorax this past spring and 1.5 years earlier, for which he was seen by Dr. Genevive Bi and treated with a chest tube. Talc pleurodesis was discussed, but was not performed, and patient continues to use CPAP every night for OSA.  Surgery is consulted by ED physician Dr. Joni Fears in this context for evaluation and management of acute appendicitis.  PAST MEDICAL HISTORY (PMH):  Past Medical History:  Diagnosis Date  . COPD (chronic obstructive pulmonary disease) ([place])   . Coronary artery disease   . Gout   . Hyperlipidemia   . Hypertension   . Sleep apnea      PAST SURGICAL HISTORY [facility]):  Past Surgical History:  Procedure Laterality Date  . CATARACT EXTRACTION  2012  . KNEE SURGERY Left 1965     MEDICATIONS:  Prior  to Admission medications   Medication Sig Start Date End Date Taking? Authorizing Provider  albuterol (PROVENTIL HFA;VENTOLIN HFA) 108 (90 Base) MCG/ACT inhaler Inhale 2 puffs into the lungs every 6 (six) hours as needed for wheezing or shortness of breath. 08/08/17   Arta Silence, MD  allopurinol (ZYLOPRIM) 100 MG tablet TAKE 1 TABLET BY MOUTH EVERY DAY 06/17/17   [provider]  ALPRAZolam Duanne Moron) 0.5 MG tablet Take 0.5 mg by mouth at bedtime as needed for anxiety.    [provider]  aspirin EC 81 MG tablet Take 81 mg by mouth daily.    [provider]  cloNIDine (CATAPRES) 0.3 MG tablet Take 0.3 mg by mouth 2 (two) times daily.     [provider]  cyanocobalamin 1000 MCG tablet Take 1,000 mcg by mouth daily.    [provider]  FLUZONE HIGH-DOSE 0.5 ML injection ADM 0.5ML IM UTD 07/12/17   [provider]  gabapentin (NEURONTIN) 300 MG capsule Take 300 mg by mouth 3 (three) times daily.    [provider]  hydrALAZINE (APRESOLINE) 50 MG tablet Take 50 mg by mouth 2 (two) times daily.     [provider]  ibuprofen (ADVIL,MOTRIN) 800 MG tablet TAKE 1 TABLET BY MOUTH EVERY 8 HOURS AS NEEDED FOR PAIN 11/12/17   [provider]  labetalol (NORMODYNE) 300 MG tablet Take 300 mg by mouth 2 (two) times daily.    [provider]  lansoprazole (PREVACID) 15 MG capsule Take 15 mg by mouth daily at 12 noon.  [provider]  lisinopril (PRINIVIL,ZESTRIL) 40 MG tablet TAKE 1 TABLET BY MOUTH EVERY DAY 06/18/16   [provider]  MEGARED OMEGA-3 KRILL OIL PO Take by mouth.    [provider]  sildenafil (REVATIO) 20 MG tablet Take 100 mg by mouth daily as needed (erectile dysfunction).     [provider]  simvastatin (ZOCOR) 20 MG tablet Take 20 mg by mouth daily.  04/04/17 04/04/18  [provider]  SYMBICORT 80-4.5 MCG/ACT inhaler Inhale 2 puffs into the lungs 2  (two) times daily. 09/11/17   [provider]     ALLERGIES:  No Known Allergies   SOCIAL HISTORY:  Social History   Socioeconomic History  . Marital status: Married    Spouse name: Not on file  . Number of children: Not on file  . Years of education: Not on file  . Highest education level: Not on file  Occupational History  . Not on file  Social Needs  . Financial resource strain: Not on file  . Food insecurity:    Worry: Not on file    Inability: Not on file  . Transportation needs:    Medical: Not on file    Non-medical: Not on file  Tobacco Use  . Smoking status: Former Smoker    Types: Cigarettes    Last attempt to quit: 06/15/1996    Years since quitting: 21.7  . Smokeless tobacco: Never Used  Substance and Sexual Activity  . Alcohol use: Yes    Comment: 1 mixed drink daily  . Drug use: No  . Sexual activity: Yes  Lifestyle  . Physical activity:    Days per week: Not on file    Minutes per session: Not on file  . Stress: Not on file  Relationships  . Social connections:    Talks on phone: Not on file    Gets together: Not on file    Attends religious service: Not on file    Active member of club or organization: Not on file    Attends meetings of clubs or organizations: Not on file    Relationship status: Not on file  . Intimate partner violence:    Fear of current or ex partner: Not on file    Emotionally abused: Not on file    Physically abused: Not on file    Forced sexual activity: Not on file  Other Topics Concern  . Not on file  Social History Narrative  . Not on file    The patient currently resides (home / rehab facility / nursing home): Home The patient normally is (ambulatory / bedbound): Ambulatory   FAMILY HISTORY:  Family History  Problem Relation Age of Onset  . Cancer Father        Unknown  . Prostate cancer Neg Hx   . Lung cancer Neg Hx      REVIEW OF SYSTEMS:  Constitutional: denies weight loss, fever, chills, or  sweats  Eyes: denies any other vision changes, history of eye injury  ENT: denies sore throat, hearing problems  Respiratory: denies shortness of breath, wheezing  Cardiovascular: denies chest pain, palpitations  Gastrointestinal: abdominal pain, N/V, and bowel function as per HPI Genitourinary: denies burning with urination or urinary frequency Musculoskeletal: denies any other joint pains or cramps  Skin: denies any other rashes or skin discolorations  Neurological: denies any other headache, dizziness, weakness  Psychiatric: denies any other depression, anxiety   All other review of systems were negative  VITAL SIGNS:  Temp:  [98.3 F (36.8 C)-98.4 F (36.9 C)] 98.4 F (36.9 C) (07/15 1517) Pulse Rate:  [79-95] 95 (07/15 1517) Resp:  [16] 16 (07/15 1052) BP: (168)/(101) 168/101 (07/15 1052) SpO2:  [92 %-100 %] 92 % (07/15 1517) Weight:  [190 lb (86.2 kg)] 190 lb (86.2 kg) (07/15 1518)     Height: 5\' 9"  (175.3 cm) Weight: 190 lb (86.2 kg) BMI (Calculated): 28.05   INTAKE/OUTPUT:  This shift: No intake/output data recorded.  Last 2 shifts: @IOLAST2SHIFTS @   PHYSICAL EXAM:  Constitutional:  -- Obese body habitus  -- Awake, alert, and oriented x3, no apparent distress Eyes:  -- Pupils equally round and reactive to light  -- No scleral icterus, B/L no occular discharge Ear, nose, throat: -- Neck is FROM WNL -- No jugular venous distension  Pulmonary:  -- No wheezes or rhales -- Equal breath sounds bilaterally -- Breathing non-labored at rest Cardiovascular:  -- S1, S2 present  -- No pericardial rubs  Gastrointestinal:  -- Abdomen soft and obese with mild focal RLQ tenderness to palpation, no guarding or rebound tenderness -- No abdominal masses appreciated, pulsatile or otherwise  Musculoskeletal and Integumentary:  -- Wounds or skin discoloration: None appreciated -- Extremities: B/L UE and LE FROM, hands and feet warm  Neurologic:  -- Motor function: Intact and  symmetric -- Sensation: Intact and symmetric Psychiatric:  -- Mood and affect WNL  Labs:  CBC Latest Ref Rng & Units 03/03/2018 09/19/2017 09/18/2017  WBC 3.8 - 10.6 K/uL 9.9 8.6 7.0  Hemoglobin 13.0 - 18.0 g/dL 12.9(L) 12.4(L) 13.9  Hematocrit 40.0 - 52.0 % 37.8(L) 36.4(L) 41.3  Platelets 150 - 440 K/uL 217 174 196   CMP Latest Ref Rng & Units 03/03/2018 09/19/2017 09/18/2017  Glucose 70 - 99 mg/dL 145(H) 120(H) 141(H)  BUN 8 - 23 mg/dL 13 24(H) 29(H)  Creatinine 0.61 - 1.24 mg/dL 1.00 1.14 1.23  Sodium 135 - 145 mmol/L 139 137 137  Potassium 3.5 - 5.1 mmol/L 3.8 3.9 4.6  Chloride 98 - 111 mmol/L 102 104 105  CO2 22 - 32 mmol/L 24 23 22   Calcium 8.9 - 10.3 mg/dL 8.8(L) 8.8(L) 8.8(L)  Total Protein 6.5 - 8.1 g/dL - 6.3(L) -  Total Bilirubin 0.3 - 1.2 mg/dL - 1.1 -  Alkaline Phos 38 - 126 U/L - 52 -  AST 15 - 41 U/L - 32 -  ALT 17 - 63 U/L - 26 -   Imaging studies:  CT Abdomen and Pelvis with Contrast (03/03/2018) - personally reviewed and discussed with patient and his wife 1. Acute appendicitis. No extraluminal air. No periappendiceal abscess. 2. No other acute findings. 3. Hepatic steatosis. 4. Aortic atherosclerosis.  Assessment/Plan: (ICD-10's: K35.30) 77 y.o. male with acute appendicitis, complicated by pertinent comorbidities including advanced chronological age, morbid obesity (BMI not able to be calculated due to no accurate/measured weight available), HTN, HLD, CAD, COPD with OSA on CPA s/p spontaneous pneumothorax x2, GERD, osteoarthritis, and gout.   - NPO and IV fluids   - pain control as needed  - all risks, benefits, and alternatives to appendectomy were discussed with the patient and his wife (specifically including, but certainly not limited to, recurrent pneumothorax during or after positive pressure ventilation), all of their questions were answered to their expressed satisfaction, patient expresses he wishes to proceed, and informed consent was obtained.   - will  plan for laparoscopic appendectomy tonight pending OR availability  - medical management of comorbidities  -  DVT prophylaxis  All of the above findings and recommendations were discussed with the patient and his family, and all of patient's and his family's questions were answered to their expressed satisfaction.  Thank you for the opportunity to participate in this patient's care.   -- Marilynne Drivers Rosana Hoes, MD, [place]: [facility] - Partnering for exceptional care. Office: [phone]

## 2018-03-03 NOTE — ED Notes (Signed)
General Surgery to bedside at this time.   

## 2018-03-03 NOTE — Anesthesia Postprocedure Evaluation (Signed)
Anesthesia Post Note  Patient: Taejon Irani Gaige  Procedure(s) Performed: APPENDECTOMY LAPAROSCOPIC (N/A )  Patient location during evaluation: PACU Anesthesia Type: General Level of consciousness: awake and alert Pain management: pain level controlled Vital Signs Assessment: post-procedure vital signs reviewed and stable Respiratory status: spontaneous breathing, nonlabored ventilation, respiratory function stable and patient connected to nasal cannula oxygen Cardiovascular status: blood pressure returned to baseline and stable Postop Assessment: no apparent nausea or vomiting Anesthetic complications: no     Last Vitals:  Vitals:   03/03/18 2240 03/03/18 2251  BP: (!) 144/74 (!) 157/86  Pulse: 80 78  Resp: 15 16  Temp:  37.3 C  SpO2: 91% 93%    Last Pain:  Vitals:   03/03/18 2251  TempSrc: Oral  PainSc:                  Precious Haws Claudy Abdallah

## 2018-03-03 NOTE — Anesthesia Post-op Follow-up Note (Signed)
Anesthesia QCDR form completed.        

## 2018-03-03 NOTE — ED Notes (Signed)
Contacted [employer], no PA needed for CT scan

## 2018-03-03 NOTE — ED Triage Notes (Signed)
Patient in today c/o generalized abdominal pain and bloating since eating brunch yesterday. Patient had chills last evening. Patient now having RLQ abdominal pain.

## 2018-03-03 NOTE — H&P (Signed)
SURGICAL ADMISSION HISTORY & PHYSICAL  HISTORY OF PRESENT ILLNESS (HPI):  77 y.o. male presented to [facility] ED today for evaluation of abdominal pain. Patient reports he first noticed mild supra-umbilical abdominal pain yesterday afternoon after finishing his birthday celebration brunch. Throughout the day and evening his pain became more "uncomfortable" with mild nausea without emesis. Throughout the night, patient passed 2 "normal" BM's, after each of which patient says he felt better. However, he also started to notice that his pain became more focused in his RLQ with subjective chills without measured fever. Having read online that his symptoms were consistent with appendicitis or bowel obstruction, he presented to [facility], where he was referred for CT Abdomen and Pelvis and then to [facility] ED. While in the ED, patient says his pain has been well controlled. He has not eaten anything since yesterday. 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 He also experienced spontaneous pneumothorax this past spring and 1.5 years earlier, for which he was seen by Dr. Genevive Bi and treated with a chest tube. [place] pleurodesis was discussed, but was not performed, and patient continues to use CPAP every night for OSA.  Surgery is consulted by ED physician Dr. Joni Fears in this context for evaluation and management of acute appendicitis.  PAST MEDICAL HISTORY (PMH):      Past Medical History:  Diagnosis Date  . COPD (chronic obstructive pulmonary disease) ([place])   . Coronary artery disease   . Gout   . Hyperlipidemia   . Hypertension   . Sleep apnea      PAST SURGICAL HISTORY [facility]):       Past Surgical History:  Procedure Laterality Date  . CATARACT EXTRACTION  2012  . KNEE SURGERY Left 1965      MEDICATIONS:         Prior to Admission medications   Medication Sig Start Date End Date Taking? Authorizing Provider  albuterol (PROVENTIL HFA;VENTOLIN HFA) 108 (90 Base) MCG/ACT inhaler Inhale 2 puffs into the lungs every 6 (six) hours as needed for wheezing or shortness of breath. 08/08/17   Arta Silence, MD  allopurinol (ZYLOPRIM) 100 MG tablet TAKE 1 TABLET BY MOUTH EVERY DAY 06/17/17   [provider]  ALPRAZolam Duanne Moron) 0.5 MG tablet Take 0.5 mg by mouth at bedtime as needed for anxiety.    [provider]  aspirin EC 81 MG tablet Take 81 mg by mouth daily.    [provider]  cloNIDine (CATAPRES) 0.3 MG tablet Take 0.3 mg by mouth 2 (two) times daily.     [provider]  cyanocobalamin 1000 MCG tablet Take 1,000 mcg by mouth daily.    [provider]  FLUZONE HIGH-DOSE 0.5 ML injection ADM 0.5ML IM UTD 07/12/17   [provider]  gabapentin (NEURONTIN) 300 MG capsule Take 300 mg by mouth 3 (three) times daily.    [provider]  hydrALAZINE (APRESOLINE) 50 MG tablet Take 50 mg by mouth 2 (two) times daily.     [provider]  ibuprofen (ADVIL,MOTRIN) 800 MG tablet TAKE 1 TABLET BY MOUTH EVERY 8 HOURS AS NEEDED FOR PAIN 11/12/17   [provider]  labetalol (NORMODYNE) 300 MG tablet Take 300 mg by mouth 2 (two) times daily.    [provider]  lansoprazole (PREVACID) 15  MG capsule Take 15 mg by mouth daily at 12 noon.    [provider]  lisinopril (PRINIVIL,ZESTRIL) 40 MG tablet TAKE 1 TABLET BY MOUTH EVERY DAY 06/18/16   [provider]  MEGARED OMEGA-3 KRILL OIL PO Take by mouth.    [provider]  sildenafil (REVATIO) 20 MG tablet Take 100 mg by mouth daily as needed (erectile dysfunction).     [provider]  simvastatin (ZOCOR) 20 MG tablet Take 20 mg by mouth daily.  04/04/17 04/04/18  [provider]  SYMBICORT 80-4.5 MCG/ACT inhaler Inhale 2 puffs into the lungs 2 (two) times daily. 09/11/17   [provider]     ALLERGIES:  No Known Allergies   SOCIAL HISTORY:  Social History        Socioeconomic History  . Marital status: Married    Spouse name: Not on file  . Number of children: Not on file  . Years of education: Not on file  . Highest education level: Not on file  Occupational History  . Not on file  Social Needs  . Financial resource strain: Not on file  . Food insecurity:    Worry: Not on file    Inability: Not on file  . Transportation needs:    Medical: Not on file    Non-medical: Not on file  Tobacco Use  . Smoking status: Former Smoker    Types: Cigarettes    Last attempt to quit: 06/15/1996    Years since quitting: 21.7  . Smokeless tobacco: Never Used  Substance and Sexual Activity  . Alcohol use: Yes    Comment: 1 mixed drink daily  . Drug use: No  . Sexual activity: Yes  Lifestyle  . Physical activity:    Days per week: Not on file    Minutes per session: Not on file  . Stress: Not on file  Relationships  . Social connections:    Talks on phone: Not on file    Gets together: Not on file    Attends religious service: Not on file    Active member of club or organization: Not on file    Attends meetings of clubs or organizations: Not on file    Relationship status: Not on file  . Intimate partner violence:    Fear of current or ex partner: Not on file    Emotionally abused: Not on file    Physically abused: Not on file    Forced sexual activity: Not on file  Other Topics Concern  . Not on file  Social History Narrative  . Not on file    The patient currently resides (home / rehab facility / nursing home): Home The patient normally is (ambulatory / bedbound): Ambulatory   FAMILY HISTORY:       Family History  Problem Relation Age of Onset  . Cancer Father         Unknown  . Prostate cancer Neg Hx   . Lung cancer Neg Hx      REVIEW OF SYSTEMS:  Constitutional: denies weight loss, fever, chills, or sweats  Eyes: denies any other vision changes, history of eye injury  ENT: denies sore throat, hearing problems  Respiratory: denies shortness of breath, wheezing  Cardiovascular: denies chest pain, palpitations  Gastrointestinal: abdominal pain, N/V, and bowel function as per HPI Genitourinary: denies burning with urination or urinary frequency Musculoskeletal: denies any other joint pains or cramps  Skin: denies any other rashes or skin discolorations  Neurological: denies any other headache, dizziness, weakness  Psychiatric: denies any other depression, anxiety   All other review of systems were negative   VITAL SIGNS:  Temp:  [98.3 F (36.8 C)-98.4 F (36.9 C)] 98.4 F (36.9 C) (07/15 1517) Pulse Rate:  [79-95] 95 (07/15 1517) Resp:  [16] 16 (07/15 1052) BP: (168)/(101) 168/101 (07/15 1052) SpO2:  [92 %-100 %] 92 % (07/15 1517) Weight:  [190 lb (86.2 kg)] 190 lb (86.2 kg) (07/15 1518)     Height: 5\' 9"  (175.3 cm) Weight: 190 lb (86.2 kg) BMI (Calculated): 28.05   INTAKE/OUTPUT:  This shift: No intake/output data recorded.  Last 2 shifts: @IOLAST2SHIFTS @   PHYSICAL EXAM:  Constitutional:  -- Obese body habitus  -- Awake, alert, and oriented x3, no apparent distress Eyes:  -- Pupils equally round and reactive to light  -- No scleral icterus, B/L no occular discharge Ear, nose, throat: -- Neck is FROM WNL -- No jugular venous distension  Pulmonary:  -- No wheezes or rhales -- Equal breath sounds bilaterally -- Breathing non-labored at rest Cardiovascular:  -- S1, S2 present  -- No pericardial rubs  Gastrointestinal:  -- Abdomen soft and obese with mild focal RLQ tenderness to palpation, no guarding or rebound tenderness -- No abdominal masses appreciated, pulsatile or otherwise  Musculoskeletal and Integumentary:  --  Wounds or skin discoloration: None appreciated -- Extremities: B/L UE and LE FROM, hands and feet warm  Neurologic:  -- Motor function: Intact and symmetric -- Sensation: Intact and symmetric Psychiatric:  -- Mood and affect WNL  Labs:  CBC Latest Ref Rng & Units 03/03/2018 09/19/2017 09/18/2017  WBC 3.8 - 10.6 K/uL 9.9 8.6 7.0  Hemoglobin 13.0 - 18.0 g/dL 12.9(L) 12.4(L) 13.9  Hematocrit 40.0 - 52.0 % 37.8(L) 36.4(L) 41.3  Platelets 150 - 440 K/uL 217 174 196   CMP Latest Ref Rng & Units 03/03/2018 09/19/2017 09/18/2017  Glucose 70 - 99 mg/dL 145(H) 120(H) 141(H)  BUN 8 - 23 mg/dL 13 24(H) 29(H)  Creatinine 0.61 - 1.24 mg/dL 1.00 1.14 1.23  Sodium 135 - 145 mmol/L 139 137 137  Potassium 3.5 - 5.1 mmol/L 3.8 3.9 4.6  Chloride 98 - 111 mmol/L 102 104 105  CO2 22 - 32 mmol/L 24 23 22   Calcium 8.9 - 10.3 mg/dL 8.8(L) 8.8(L) 8.8(L)  Total Protein 6.5 - 8.1 g/dL - 6.3(L) -  Total Bilirubin 0.3 - 1.2 mg/dL - 1.1 -  Alkaline Phos 38 - 126 U/L - 52 -  AST 15 - 41 U/L - 32 -  ALT 17 - 63 U/L - 26 -   Imaging studies:  CT Abdomen and Pelvis with Contrast (03/03/2018) - personally reviewed and discussed with patient and his wife 1. Acute appendicitis. No extraluminal air. No periappendiceal abscess. 2. No other acute findings. 3. Hepatic steatosis. 4. Aortic atherosclerosis.  Assessment/Plan: (ICD-10's: K35.30) 77 y.o. male with acute appendicitis, complicated by pertinent comorbidities including advanced chronological age, morbid obesity (BMI not able to be calculated due to no accurate/measured weight available), HTN, HLD, CAD, COPD with OSA on CPA s/p spontaneous pneumothorax x2, GERD, osteoarthritis, and gout.              - NPO and IV fluids              - pain control as needed             - all risks, benefits, and alternatives to appendectomy were discussed with the patient and  his wife (specifically including, but certainly not limited to, recurrent pneumothorax during or after  positive pressure ventilation), all of their questions were answered to their expressed satisfaction, patient expresses he wishes to proceed, and informed consent was obtained.              - will plan for laparoscopic appendectomy tonight pending OR availability             - medical management of comorbidities             - DVT prophylaxis  All of the above findings and recommendations were discussed with the patient and his family, and all of patient's and his family's questions were answered to their expressed satisfaction.  -- Marilynne Drivers Rosana Hoes, MD, [place]: [facility] Partnering for exceptional care. Office: [phone]

## 2018-03-03 NOTE — ED Notes (Signed)
Pt transported to OR at this time via orderly.

## 2018-03-03 NOTE — Anesthesia Preprocedure Evaluation (Signed)
Anesthesia Evaluation  Patient identified by MRN, date of birth, ID band Patient awake    Reviewed: Allergy & Precautions, H&P , NPO status , Patient's Chart, lab work & pertinent test results  History of Anesthesia Complications Negative for: history of anesthetic complications  Airway Mallampati: III  TM Distance: <3 FB Neck ROM: limited    Dental  (+) Chipped, Poor Dentition, Missing   Pulmonary neg shortness of breath, sleep apnea and Continuous Positive Airway Pressure Ventilation , COPD, former smoker,           Cardiovascular Exercise Tolerance: Good hypertension, (-) angina+ CAD and + Peripheral Vascular Disease  (-) Past MI + Valvular Problems/Murmurs 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      Neuro/Psych negative neurological ROS  negative psych ROS   GI/Hepatic Neg liver ROS, GERD  Medicated and Controlled,  Endo/Other  negative endocrine ROS  Renal/GU      Musculoskeletal   Abdominal   Peds  Hematology negative hematology ROS (+)   Anesthesia Other Findings Past Medical History: No date: COPD (chronic obstructive pulmonary disease) ([facility]) No date: Coronary artery disease No date: Gout No date: Hyperlipidemia No date: Hypertension No date: Sleep apnea  Past Surgical History: 2012: CATARACT EXTRACTION 1965: KNEE SURGERY; Left  BMI    Body Mass Index:  28.06 kg/m      Reproductive/Obstetrics negative OB ROS                             Anesthesia Physical Anesthesia Plan  ASA: III  Anesthesia Plan: General ETT, Rapid Sequence and Cricoid Pressure   Post-op Pain Management:    Induction: Intravenous  PONV Risk Score and Plan: Ondansetron, Dexamethasone, Midazolam and Treatment may vary due to age or medical condition  Airway Management Planned: Oral ETT  Additional Equipment:   Intra-op Plan:   Post-operative Plan: Extubation in OR  Informed Consent: I have reviewed the patients  History and Physical, chart, labs and discussed the procedure including the risks, benefits and alternatives for the proposed anesthesia with the patient or authorized representative who has indicated his/her understanding and acceptance.   Dental Advisory Given  Plan Discussed with: Anesthesiologist, CRNA and Surgeon  Anesthesia Plan Comments: (Patient consented for risks of anesthesia including but not limited to:  - adverse reactions to medications - damage to teeth, lips or other oral mucosa - sore throat or hoarseness - Damage to heart, brain, lungs or loss of life  Patient voiced understanding.)        Anesthesia Quick Evaluation

## 2018-03-03 NOTE — ED Triage Notes (Signed)
Sent by [facility] for appendicitis.  Has has labs and CT completed already.

## 2018-03-03 NOTE — Transfer of Care (Signed)
Immediate Anesthesia Transfer of Care Note  Patient: Tony Mitchell  Procedure(s) Performed: APPENDECTOMY LAPAROSCOPIC (N/A )  Patient Location: PACU  Anesthesia Type:General  Level of Consciousness: awake and patient cooperative  Airway & Oxygen Therapy: Patient Spontanous Breathing and Patient connected to nasal cannula oxygen  Post-op Assessment: Post -op Vital signs reviewed and stable  Post vital signs: Reviewed and stable  Last Vitals:  Vitals Value Taken Time  BP 154/83 03/03/2018 10:00 PM  Temp 37.1 C 03/03/2018 10:00 PM  Pulse 91 03/03/2018 10:02 PM  Resp 14 03/03/2018 10:00 PM  SpO2 100 % 03/03/2018 10:02 PM  Vitals shown include unvalidated device data.  Last Pain:  Vitals:   03/03/18 1916  PainSc: 1          Complications: No apparent anesthesia complications

## 2018-03-04 ENCOUNTER — Encounter: Payer: Self-pay | Admitting: Surgery

## 2018-03-04 DIAGNOSIS — K3531 Acute appendicitis with localized peritonitis and gangrene, without perforation: Secondary | ICD-10-CM | POA: Diagnosis not present

## 2018-03-04 MED ORDER — OXYCODONE-ACETAMINOPHEN 5-325 MG PO TABS: 1.0000 | ORAL_TABLET | ORAL | 0 refills | Status: DC | PRN

## 2018-03-04 NOTE — Discharge Instructions (Signed)
In addition to included general post-operative instructions for Laparoscopic Appendectomy,  Diet: Resume home heart healthy diet.   Activity: No heavy lifting >20 pounds (children, pets, laundry, garbage) or strenuous activity until follow-up, but light activity and walking are encouraged. Do not drive or drink alcohol if taking narcotic pain medications.  Wound care: 2 days after surgery (Thursday, 7/18), you may shower/get incision wet with soapy water and pat dry (do not rub incisions), but no baths or submerging incision underwater until follow-up.   Medications: Resume all home medications. For mild to moderate pain: acetaminophen (Tylenol) or ibuprofen/naproxen (if no kidney disease). Combining Tylenol with alcohol can substantially increase your risk of causing liver disease. Narcotic pain medications, if prescribed, can be used for severe pain, though may cause nausea, constipation, and drowsiness. Do not combine Tylenol and Percocet (or similar) within a 6 hour period as Percocet (and similar) contain(s) Tylenol. If you do not need the narcotic pain medication, you do not need to fill the prescription.  Call office [phone]) at any time if any questions, worsening pain, fevers/chills, bleeding, drainage from incision site, or other concerns.

## 2018-03-04 NOTE — Progress Notes (Signed)
M[facility] [place] to be D/C'd Home per MD order.  Discussed prescriptions and follow up appointments with the patient. Prescriptions given to patient, medication list explained in detail. Pt verbalized understanding.  Allergies as of 03/04/2018   No Known Allergies     Medication List    TAKE these medications   albuterol 108 (90 Base) MCG/ACT inhaler Commonly known as:  PROVENTIL HFA;VENTOLIN HFA Inhale 2 puffs into the lungs every 6 (six) hours as needed for wheezing or shortness of breath.   allopurinol 100 MG tablet Commonly known as:  ZYLOPRIM TAKE 1 TABLET BY MOUTH EVERY DAY   aspirin EC 81 MG tablet Take 81 mg by mouth daily.   cloNIDine 0.3 MG tablet Commonly known as:  CATAPRES Take 0.3 mg by mouth 2 (two) times daily.   cyanocobalamin 1000 MCG tablet Take 1,000 mcg by mouth daily.   FLUZONE HIGH-DOSE 0.5 ML injection Generic drug:  Influenza vac split quadrivalent PF ADM 0.5ML IM UTD   gabapentin 300 MG capsule Commonly known as:  NEURONTIN Take 300 mg by mouth 3 (three) times daily.   hydrALAZINE 50 MG tablet Commonly known as:  APRESOLINE Take 50 mg by mouth 2 (two) times daily.   ibuprofen 800 MG tablet Commonly known as:  ADVIL,MOTRIN TAKE 1 TABLET BY MOUTH EVERY 8 HOURS AS NEEDED FOR PAIN   labetalol 300 MG tablet Commonly known as:  NORMODYNE Take 300 mg by mouth 2 (two) times daily.   lisinopril 40 MG tablet Commonly known as:  PRINIVIL,ZESTRIL TAKE 1 TABLET BY MOUTH EVERY DAY   oxyCODONE-acetaminophen 5-325 MG tablet Commonly known as:  PERCOCET/ROXICET Take 1 tablet by mouth every 4 (four) hours as needed.   sildenafil 20 MG tablet Commonly known as:  REVATIO Take 100 mg by mouth daily as needed (erectile dysfunction).   simvastatin 20 MG tablet Commonly known as:  ZOCOR Take 20 mg by mouth daily.   SYMBICORT 80-4.5 MCG/ACT inhaler Generic drug:  budesonide-formoterol Inhale 2 puffs into the lungs 2 (two) times daily.        Vitals:   03/04/18 0549 03/04/18 0916  BP: 111/73   Pulse: (!) 106   Resp: 20   Temp: 98.6 F (37 C)   SpO2: 95% 91%    Skin clean, dry and intact without evidence of skin break down, no evidence of skin tears noted. IV catheter discontinued intact. Site without signs and symptoms of complications. Dressing and pressure applied. Pt denies pain at this time. No complaints noted.  An After Visit Summary was printed and given to the patient. Patient escorted via [place], and D/C home via private auto.  Sharalyn Ink

## 2018-03-04 NOTE — Care Management Obs Status (Signed)
[facility] NOTIFICATION   Patient Details  Name: Tony Mitchell MRN: [id number] Date of Birth: 05/08/1941   Medicare Observation Status Notification Given:  No(admitted obs less thand 24 hours)    Beverly Sessions, RN 03/04/2018, 9:10 AM

## 2018-03-05 LAB — SURGICAL PATHOLOGY

## 2018-03-05 NOTE — Discharge Summary (Signed)
Physician Discharge Summary  Patient ID: Tony Mitchell MRN: [id number] DOB/AGE: 77-07-1941 77 y.o.  Admit date: 03/03/2018 Discharge date: 03/04/2018  Admission Diagnoses:  Discharge Diagnoses:  Active Problems:   Acute appendicitis   Discharged Condition: good  Hospital Course: 77 y.o. male presented to [facility] ED for abdominal pain. Workup was found to be significant for CT imaging demonstrating acute appendicitis. Informed consent was obtained and documented, and patient underwent laparoscopic appendectomy Rosana Hoes, 03/03/2018).  Post-operatively, patient's pain improved/resolved and advancement of patient's diet and ambulation were well-tolerated. The remainder of patient's hospital course was essentially unremarkable, and discharge planning was initiated accordingly with patient safely able to be discharged home with appropriate discharge instructions, pain control, and outpatient surgical follow-up after all of his questions were answered to his expressed satisfaction.  Consults: None  Significant Diagnostic Studies: radiology: CT scan: acute appendicitis  Treatments: IV hydration, antibiotics: Zosyn and surgery: laparoscopic appendectomy Rosana Hoes, 03/03/2018)  Discharge Exam: Blood pressure 111/73, pulse (!) 106, temperature 98.6 F (37 C), temperature source Oral, resp. rate 20, height 5\' 9"  (1.753 m), weight 215 lb (97.5 kg), SpO2 91 %. General appearance: alert, cooperative and no distress GI: abdomen soft and non-distended with mild peri-incisional tenderness to palpation, post-surgical abdominal wounds well-approximated without surrounding erythema or drainage  Disposition:    Allergies as of 03/04/2018   No Known Allergies     Medication List    TAKE these medications   albuterol 108 (90 Base) MCG/ACT inhaler Commonly known as:  PROVENTIL HFA;VENTOLIN HFA Inhale 2 puffs into the lungs every 6 (six) hours as needed for wheezing or shortness of breath.   allopurinol  100 MG tablet Commonly known as:  ZYLOPRIM TAKE 1 TABLET BY MOUTH EVERY DAY   aspirin EC 81 MG tablet Take 81 mg by mouth daily.   cloNIDine 0.3 MG tablet Commonly known as:  CATAPRES Take 0.3 mg by mouth 2 (two) times daily.   cyanocobalamin 1000 MCG tablet Take 1,000 mcg by mouth daily.   FLUZONE HIGH-DOSE 0.5 ML injection Generic drug:  Influenza vac split quadrivalent PF ADM 0.5ML IM UTD   gabapentin 300 MG capsule Commonly known as:  NEURONTIN Take 300 mg by mouth 3 (three) times daily.   hydrALAZINE 50 MG tablet Commonly known as:  APRESOLINE Take 50 mg by mouth 2 (two) times daily.   ibuprofen 800 MG tablet Commonly known as:  ADVIL,MOTRIN TAKE 1 TABLET BY MOUTH EVERY 8 HOURS AS NEEDED FOR PAIN   labetalol 300 MG tablet Commonly known as:  NORMODYNE Take 300 mg by mouth 2 (two) times daily.   lisinopril 40 MG tablet Commonly known as:  PRINIVIL,ZESTRIL TAKE 1 TABLET BY MOUTH EVERY DAY   oxyCODONE-acetaminophen 5-325 MG tablet Commonly known as:  PERCOCET/ROXICET Take 1 tablet by mouth every 4 (four) hours as needed.   sildenafil 20 MG tablet Commonly known as:  REVATIO Take 100 mg by mouth daily as needed (erectile dysfunction).   simvastatin 20 MG tablet Commonly known as:  ZOCOR Take 20 mg by mouth daily.   SYMBICORT 80-4.5 MCG/ACT inhaler Generic drug:  budesonide-formoterol Inhale 2 puffs into the lungs 2 (two) times daily.      Follow-up Information    Vickie Epley, MD. Go on 03/18/2018.   Specialty:  General Surgery Why:  Tuesday July 30th at 10:30am for a follow-up appointment Contact information: [place] [address] [phone]           Signed:  Vickie Epley 03/05/2018, 9:12 PM

## 2018-03-14 ENCOUNTER — Other Ambulatory Visit: Payer: Self-pay

## 2018-03-18 ENCOUNTER — Ambulatory Visit (INDEPENDENT_AMBULATORY_CARE_PROVIDER_SITE_OTHER): Payer: Medicare Other | Admitting: Surgery

## 2018-03-18 ENCOUNTER — Ambulatory Visit
Admission: RE | Admit: 2018-03-18 | Discharge: 2018-03-18 | Disposition: A | Discharge status: Home / Self Care | Payer: Medicare Other | Source: Ambulatory Visit | Attending: Surgery | Admitting: Surgery

## 2018-03-18 ENCOUNTER — Encounter: Payer: Self-pay | Admitting: Surgery

## 2018-03-18 VITALS — BP 179/75 | HR 137 | Temp 98.1°F | Wt 186.0 lb

## 2018-03-18 DIAGNOSIS — R918 Other nonspecific abnormal finding of lung field: Secondary | ICD-10-CM | POA: Diagnosis not present

## 2018-03-18 DIAGNOSIS — R059 Cough, unspecified: Secondary | ICD-10-CM

## 2018-03-18 DIAGNOSIS — R05 Cough: Secondary | ICD-10-CM | POA: Insufficient documentation

## 2018-03-18 DIAGNOSIS — K3580 Unspecified acute appendicitis: Secondary | ICD-10-CM

## 2018-03-18 DIAGNOSIS — Z4889 Encounter for other specified surgical aftercare: Secondary | ICD-10-CM

## 2018-03-18 NOTE — Patient Instructions (Signed)
Please go to the [facility] and have your Chest X-Ray done today. We will call you with results.  We will also call you to let you know when you will see your primary care doctor.

## 2018-03-18 NOTE — Progress Notes (Signed)
Surgical Clinic Progress/Follow-up Note   HPI:  77 y.o. Male presents to clinic for post-op follow-up 2 weeks s/p laparoscopic appendectomy Rosana Hoes, 03/03/2018). Patient reports complete resolution of pre-operative pain and has been tolerating regular diet with +flatus and normal BM's, denies N/V, or fever/chills. Patient also denies CP or SOB, stating he was able to ambulate from his car in the parking lot to surgical office today without CP or SOB, but he complains primarily of frequent unremitting cough despite his wife describing an episode of desaturation on home pulse oximetry a few days ago.  Review of Systems:  Constitutional: denies fever/chills  Respiratory: denies shortness of breath, wheezing (see HPI)  Cardiovascular: denies chest pain, palpitations  Gastrointestinal: abdominal pain, N/V, and bowel function as per interval history Skin: Denies any other rashes or skin discolorations except post-surgical wounds as per interval history  Vital Signs:  BP (!) 179/75   Pulse (!) 137   Temp 98.1 F (36.7 C) (Oral)   Wt 186 lb (84.4 kg)   BMI 27.47 kg/m    Physical Exam:  Constitutional:  -- Obese body habitus  -- Awake, alert, and oriented x3  Pulmonary:  -- No crackles -- Equal breath sounds bilaterally -- Breathing non-labored at rest Cardiovascular:  -- S1, S2 present  -- No pericardial rubs  Gastrointestinal:  -- Soft and non-distended, non-tender to palpation, no guarding/rebound tenderness -- Post-surgical incisions all well-approximated without any peri-incisional erythema or drainage -- No abdominal masses appreciated, pulsatile or otherwise  Musculoskeletal / Integumentary:  -- Wounds or skin discoloration: None appreciated except post-surgical incisions as described above (GI) -- Extremities: B/L UE and LE FROM, hands and feet warm, no edema   Assessment:  77 y.o. yo Male with a problem list including...  Patient Active Problem List   Diagnosis Date Noted   . Acute appendicitis 03/03/2018  . History of pneumothorax 11/07/2017  . Pneumothorax 09/18/2017  . Thoracic aortic aneurysm without rupture ([place]) 08/16/2017  . Primary osteoarthritis of right knee 07/10/2017  . Spinal stenosis of lumbar region with radiculopathy 07/10/2017  . GERD (gastroesophageal reflux disease) 06/13/2016  . Gout 06/13/2016  . Hyperlipidemia, unspecified 06/13/2016  . Hypertension 06/13/2016  . Pneumothorax on right 06/03/2016  . Elevated PSA 03/29/2016  . Elevated blood sugar 09/29/2015  . Rash 02/17/2015  . OSA (obstructive sleep apnea) 07/08/2014  . S/P cardiac catheterization 05/28/2014  . SOB (shortness of breath) on exertion 05/28/2014    presents to clinic for post-op follow-up evaluation with complaint of unremitting frequent cough in the context of COPD, prior recurrent pneumothoraces x2, and chronic CPAP for OSA, just over 2 weeks s/p laparoscopic appendectomy Rosana Hoes, 03/03/2018) for gangrenous but non-perforated appendicitis.  Plan:   - will check CXR to evaluate for pneumonia or for less suspected pneumothorax considering history and, if no pneumothorax, will help patient to schedule prompt follow-up with his primary care physician this week             - advance diet as tolerated, okay to submerge incisions under water (baths, swimming) prn             - apply sunblock particularly to incisions with sun exposure to reduce pigmentation of scars             - gradually resume all activities without restrictions over next 2 weeks  - instructed to call office if any questions or concerns             - return to  clinic as needed  All of the above recommendations were discussed with the patient and patient's wife, and all of patient's and family's questions were answered to their expressed satisfaction.  -- Marilynne Drivers Rosana Hoes, MD, [place]: [facility] - Partnering for exceptional care. Office: [phone]

## 2018-03-25 ENCOUNTER — Ambulatory Visit
Admission: RE | Admit: 2018-03-25 | Discharge: 2018-03-25 | Disposition: A | Discharge status: Home / Self Care | Payer: Medicare Other | Source: Ambulatory Visit | Attending: Internal Medicine | Admitting: Internal Medicine

## 2018-03-25 ENCOUNTER — Other Ambulatory Visit: Payer: Self-pay | Admitting: Internal Medicine

## 2018-03-25 ENCOUNTER — Telehealth: Payer: Self-pay

## 2018-03-25 ENCOUNTER — Other Ambulatory Visit: Payer: Self-pay | Admitting: Surgery

## 2018-03-25 DIAGNOSIS — T8143XA Infection following a procedure, organ and space surgical site, initial encounter: Secondary | ICD-10-CM

## 2018-03-25 DIAGNOSIS — K388 Other specified diseases of appendix: Secondary | ICD-10-CM

## 2018-03-25 DIAGNOSIS — R05 Cough: Secondary | ICD-10-CM | POA: Diagnosis present

## 2018-03-25 DIAGNOSIS — T8149XA Infection following a procedure, other surgical site, initial encounter: Principal | ICD-10-CM

## 2018-03-25 DIAGNOSIS — R140 Abdominal distension (gaseous): Secondary | ICD-10-CM | POA: Insufficient documentation

## 2018-03-25 DIAGNOSIS — R935 Abnormal findings on diagnostic imaging of other abdominal regions, including retroperitoneum: Secondary | ICD-10-CM | POA: Insufficient documentation

## 2018-03-25 DIAGNOSIS — K6389 Other specified diseases of intestine: Secondary | ICD-10-CM | POA: Diagnosis not present

## 2018-03-25 MED ORDER — IOPAMIDOL (ISOVUE-300) INJECTION 61%
100.0000 mL | Freq: Once | INTRAVENOUS | Status: AC | PRN
  Administered 2018-03-25: 100 mL via INTRAVENOUS

## 2018-03-25 NOTE — Telephone Encounter (Signed)
Tony Mitchell from Specialty Scheduling called back to let us know that his appointment to have his CT Guided Drainage done tomorrow 03/26/2018 at 9:00 AM at the [facility]. I then called Caryl Pina (Dr. Linton Ham office) to tell her about the date, time and location for the patient to have the drainage done. I also told Caryl Pina that I would call [name] (CT Depts in [place]) to release the patient and to let him know where he is to report tomorrow morning. [name] stated that she will let the patient know. Patient had been notified by [facility].

## 2018-03-25 NOTE — Telephone Encounter (Signed)
Dr. Jacqualyn Posey (IR) called and wanted to speak with Dr. Rosana Hoes. They both were discussing patient's images and what was recommended by Dr. Jacqualyn Posey. At the end of the conversation, Dr. Jacqualyn Posey told Dr. Rosana Hoes that the schedulers would be calling us back with a time for the patient to be seen either tonight or tomorrow morning. Dr. Rosana Hoes agreed with Dr. Jacqualyn Posey. Now we have to wait for the scheduler to call me back.

## 2018-03-25 NOTE — Telephone Encounter (Signed)
Caryl Pina from Dr. Linton Ham office called stating that Dr. Ginette Pitman had ordered a CT Scan on patient and they found a 14 cm multilocular rim enhancing fluid collection in right lower quadrant, consistent with abscess. Dr. Ginette Pitman wanted Dr. Rosana Hoes to order an interventional radiology consultation for percutaneous catheter drainage. Caryl Pina was told that we would let Dr. Rosana Hoes know about it and that we will take care of it. Caryl Pina then wanted to know when it would be scheduled. She was told that we needed to place the order and then call specialty scheduling department and they will tell when this could be done.  A few seconds later, Caryl Pina called back wanting to know when it was scheduled since patient was still in the [place] location. While I was still on the phone, specialty scheduling was not able to schedule it until Dr. Jacqualyn Posey (IR) approves of it. Marcie Bal from Specialty scheduling stated that she would call me back once she had authorization to do so.  I will call Caryl Pina from Dr. Linton Ham office once we have a time for the drainage to be performed.

## 2018-03-26 ENCOUNTER — Ambulatory Visit
Admission: RE | Admit: 2018-03-26 | Discharge: 2018-03-26 | Disposition: A | Discharge status: Home / Self Care | Payer: Medicare Other | Source: Ambulatory Visit | Attending: Surgery | Admitting: Surgery

## 2018-03-26 DIAGNOSIS — Z79899 Other long term (current) drug therapy: Secondary | ICD-10-CM | POA: Diagnosis not present

## 2018-03-26 DIAGNOSIS — T8149XA Infection following a procedure, other surgical site, initial encounter: Secondary | ICD-10-CM

## 2018-03-26 DIAGNOSIS — I10 Essential (primary) hypertension: Secondary | ICD-10-CM | POA: Diagnosis not present

## 2018-03-26 DIAGNOSIS — E785 Hyperlipidemia, unspecified: Secondary | ICD-10-CM | POA: Diagnosis not present

## 2018-03-26 DIAGNOSIS — Z7982 Long term (current) use of aspirin: Secondary | ICD-10-CM | POA: Insufficient documentation

## 2018-03-26 DIAGNOSIS — Z9889 Other specified postprocedural states: Secondary | ICD-10-CM | POA: Diagnosis not present

## 2018-03-26 DIAGNOSIS — M109 Gout, unspecified: Secondary | ICD-10-CM | POA: Insufficient documentation

## 2018-03-26 DIAGNOSIS — Z79810 Long term (current) use of selective estrogen receptor modulators (SERMs): Secondary | ICD-10-CM | POA: Insufficient documentation

## 2018-03-26 DIAGNOSIS — Z9049 Acquired absence of other specified parts of digestive tract: Secondary | ICD-10-CM | POA: Diagnosis not present

## 2018-03-26 DIAGNOSIS — K3533 Acute appendicitis with perforation and localized peritonitis, with abscess: Secondary | ICD-10-CM | POA: Diagnosis not present

## 2018-03-26 DIAGNOSIS — G4730 Sleep apnea, unspecified: Secondary | ICD-10-CM | POA: Insufficient documentation

## 2018-03-26 DIAGNOSIS — J449 Chronic obstructive pulmonary disease, unspecified: Secondary | ICD-10-CM | POA: Insufficient documentation

## 2018-03-26 DIAGNOSIS — Z809 Family history of malignant neoplasm, unspecified: Secondary | ICD-10-CM | POA: Insufficient documentation

## 2018-03-26 DIAGNOSIS — T8143XA Infection following a procedure, organ and space surgical site, initial encounter: Secondary | ICD-10-CM | POA: Insufficient documentation

## 2018-03-26 DIAGNOSIS — I2510 Atherosclerotic heart disease of native coronary artery without angina pectoris: Secondary | ICD-10-CM | POA: Diagnosis not present

## 2018-03-26 DIAGNOSIS — Z87891 Personal history of nicotine dependence: Secondary | ICD-10-CM | POA: Diagnosis not present

## 2018-03-26 LAB — CBC
HEMATOCRIT: 23.8 % — AB (ref 40.0–52.0)
HEMOGLOBIN: 7.8 g/dL — AB (ref 13.0–18.0)
MCH: 29 pg (ref 26.0–34.0)
MCHC: 32.8 g/dL (ref 32.0–36.0)
MCV: 88.3 fL (ref 80.0–100.0)
Platelets: 621 10*3/uL — ABNORMAL HIGH (ref 150–440)
RBC: 2.69 MIL/uL — ABNORMAL LOW (ref 4.40–5.90)
RDW: 19.2 % — AB (ref 11.5–14.5)
WBC: 12.3 10*3/uL — ABNORMAL HIGH (ref 3.8–10.6)

## 2018-03-26 LAB — PROTIME-INR
INR: 1.28
Prothrombin Time: 15.9 seconds — ABNORMAL HIGH (ref 11.4–15.2)

## 2018-03-26 MED ORDER — LIDOCAINE HCL (PF) 1 % IJ SOLN
INTRAMUSCULAR | Status: AC | PRN
  Administered 2018-03-26: 4 mL

## 2018-03-26 MED ORDER — ACETAMINOPHEN 325 MG PO TABS
ORAL_TABLET | ORAL | Status: AC
  Filled 2018-03-26: qty 2

## 2018-03-26 MED ORDER — SODIUM CHLORIDE FLUSH 0.9 % IV SOLN
INTRAVENOUS | Status: AC
  Filled 2018-03-26: qty 100

## 2018-03-26 MED ORDER — SODIUM CHLORIDE 0.9 % IV SOLN
INTRAVENOUS | Status: DC
  Administered 2018-03-26: 10:00:00 via INTRAVENOUS

## 2018-03-26 MED ORDER — MIDAZOLAM HCL 5 MG/5ML IJ SOLN
INTRAMUSCULAR | Status: AC | PRN
  Administered 2018-03-26: 0.5 mg via INTRAVENOUS
  Administered 2018-03-26: 1 mg via INTRAVENOUS

## 2018-03-26 MED ORDER — PIPERACILLIN-TAZOBACTAM 3.375 G IVPB 30 MIN
3.3750 g | Freq: Once | INTRAVENOUS | Status: AC
  Administered 2018-03-26: 3.375 g via INTRAVENOUS
  Filled 2018-03-26: qty 50

## 2018-03-26 MED ORDER — FENTANYL CITRATE (PF) 100 MCG/2ML IJ SOLN
INTRAMUSCULAR | Status: AC
  Filled 2018-03-26: qty 4

## 2018-03-26 MED ORDER — HYDROCODONE-ACETAMINOPHEN 5-325 MG PO TABS: 1.0000 | ORAL_TABLET | ORAL | Status: DC | PRN

## 2018-03-26 MED ORDER — FENTANYL CITRATE (PF) 100 MCG/2ML IJ SOLN
INTRAMUSCULAR | Status: AC | PRN
  Administered 2018-03-26: 50 ug via INTRAVENOUS

## 2018-03-26 MED ORDER — SODIUM CHLORIDE 0.9% FLUSH: 5.0000 mL | Freq: Two times a day (BID) | INTRAVENOUS | Status: DC

## 2018-03-26 MED ORDER — MIDAZOLAM HCL 5 MG/5ML IJ SOLN
INTRAMUSCULAR | Status: AC
  Filled 2018-03-26: qty 5

## 2018-03-26 MED ORDER — ACETAMINOPHEN 325 MG PO TABS
650.0000 mg | ORAL_TABLET | Freq: Once | ORAL | Status: AC
  Administered 2018-03-26: 650 mg via ORAL

## 2018-03-26 MED ORDER — CEFAZOLIN SODIUM-DEXTROSE 2-4 GM/100ML-% IV SOLN: 2.0000 g | Freq: Once | INTRAVENOUS | Status: DC

## 2018-03-26 NOTE — Progress Notes (Signed)
Patient clinically stable post abscess drain placement per DR Kathlene Cote, preprocedure temp 100.4, immediately post procedure temp @ 101.4, with tylenol given per orders, now back to pre procedure, will continue to follow, wife at bedside.

## 2018-03-26 NOTE — Procedures (Signed)
Interventional Radiology Procedure Note  Procedure: CT guided catheter drainage of RLQ abscess  Complications: None  Estimated Blood Loss: < 10 mL  Findings: RLQ abscess yielded bloody, turbid fluid.  Sample sent for culture.  12 Fr drain placed and attached to suction bulb.  Venetia Night. Kathlene Cote, M.D Pager:  [phone]

## 2018-03-26 NOTE — H&P (Signed)
Chief Complaint: Patient was seen in consultation today for abscess drainage at the request of Vickie Epley  Referring Physician(s): Vickie Epley  Patient Status: Tony Mitchell  History of Present Illness: Tony Mitchell is a 77 y.o. male status post appendectomy on 03/03/18 by Dr. Rosana Hoes for acute gangrenous appendicitis with localized peritonitis.  Discharged 7/16. Was doing better postoperatively but began to develop abdominal pain in last few days with CT yesterday demonstrating large RLQ peritoneal abscess measuring up to 14 cm with communicating loculations.  No free air or bowel obstruction. Patient did not want to be admitted to hospital. Filled Rx for antibiotics but did not start them yet.  Has felt weak and fatigued.  Denies high fever or chills.  Past Medical History:  Diagnosis Date  . Acute appendicitis 03/03/2018  . COPD (chronic obstructive pulmonary disease) ([place])   . Coronary artery disease   . Difficult intubation   . Gout   . Hyperlipidemia   . Hypertension   . Sleep apnea     Past Surgical History:  Procedure Laterality Date  . CATARACT EXTRACTION  2012  . KNEE SURGERY Left 1965  . LAPAROSCOPIC APPENDECTOMY N/A 03/03/2018   Procedure: APPENDECTOMY LAPAROSCOPIC;  Surgeon: Vickie Epley, MD;  Location: [facility] ORS;  Service: General;  Laterality: N/A;    Allergies: Patient has no known allergies.  Medications: Prior to Admission medications   Medication Sig Start Date End Date Taking? Authorizing Provider  albuterol (PROVENTIL HFA;VENTOLIN HFA) 108 (90 Base) MCG/ACT inhaler Inhale 2 puffs into the lungs every 6 (six) hours as needed for wheezing or shortness of breath. 08/08/17  Yes Arta Silence, MD  allopurinol (ZYLOPRIM) 100 MG tablet TAKE 1 TABLET BY MOUTH EVERY DAY 06/17/17  Yes [provider]  ALPRAZolam (XANAX) 0.5 MG tablet TAKE 1 TABLET BY MOUTH NIGHTLY AS NEEDED 03/04/18  Yes [provider]  aspirin EC 81 MG  tablet Take 81 mg by mouth daily.   Yes [provider]  cloNIDine (CATAPRES) 0.3 MG tablet Take 0.3 mg by mouth 2 (two) times daily.    Yes [provider]  cyanocobalamin 1000 MCG tablet Take 1,000 mcg by mouth daily.   Yes [provider]  FLUZONE HIGH-DOSE 0.5 ML injection ADM 0.5ML IM UTD 07/12/17  Yes [provider]  gabapentin (NEURONTIN) 300 MG capsule Take 300 mg by mouth 3 (three) times daily.   Yes [provider]  hydrALAZINE (APRESOLINE) 50 MG tablet Take 50 mg by mouth 2 (two) times daily.    Yes [provider]  ibuprofen (ADVIL,MOTRIN) 800 MG tablet TAKE 1 TABLET BY MOUTH EVERY 8 HOURS AS NEEDED FOR PAIN 11/12/17  Yes [provider]  labetalol (NORMODYNE) 300 MG tablet Take 300 mg by mouth 2 (two) times daily.   Yes [provider]  lisinopril (PRINIVIL,ZESTRIL) 40 MG tablet TAKE 1 TABLET BY MOUTH EVERY DAY 06/18/16  Yes [provider]  sildenafil (REVATIO) 20 MG tablet Take 100 mg by mouth daily as needed (erectile dysfunction).    Yes [provider]  simvastatin (ZOCOR) 20 MG tablet Take 20 mg by mouth daily.  04/04/17 04/04/18 Yes [provider]  SYMBICORT 80-4.5 MCG/ACT inhaler Inhale 2 puffs into the lungs 2 (two) times daily. 09/11/17  Yes [provider]     Family History  Problem Relation Age of Onset  . Cancer Father        Unknown  . Prostate cancer Neg Hx   .  Lung cancer Neg Hx     Social History   Socioeconomic History  . Marital status: Married    Spouse name: Not on file  . Number of children: Not on file  . Years of education: Not on file  . Highest education level: Not on file  Occupational History  . Not on file  Social Needs  . Financial resource strain: Not on file  . Food insecurity:    Worry: Not on file    Inability: Not on file  . Transportation needs:    Medical: Not on file    Non-medical: Not on file  Tobacco Use  . Smoking  status: Former Smoker    Types: Cigarettes    Last attempt to quit: 06/15/1996    Years since quitting: 21.7  . Smokeless tobacco: Never Used  Substance and Sexual Activity  . Alcohol use: Yes    Comment: 1 mixed drink daily  . Drug use: No  . Sexual activity: Yes  Lifestyle  . Physical activity:    Days per week: Not on file    Minutes per session: Not on file  . Stress: Not on file  Relationships  . Social connections:    Talks on phone: Not on file    Gets together: Not on file    Attends religious service: Not on file    Active member of club or organization: Not on file    Attends meetings of clubs or organizations: Not on file    Relationship status: Not on file  Other Topics Concern  . Not on file  Social History Narrative  . Not on file    Review of Systems: A 12 point ROS discussed and pertinent positives are indicated in the HPI above.  All other systems are negative.  Review of Systems  Constitutional: Positive for diaphoresis and fatigue.  HENT: Negative.   Respiratory: Negative.   Cardiovascular: Negative.   Gastrointestinal: Positive for abdominal distention and abdominal pain. Negative for blood in stool, diarrhea, nausea and vomiting.  Genitourinary: Negative.   Musculoskeletal: Negative.   Neurological: Negative.     Vital Signs: BP (!) 152/75   Pulse (!) 108   Temp 100.3 F (37.9 C) (Oral)   Resp 20   Ht 5\' 9"  (1.753 m)   Wt 186 lb (84.4 kg)   SpO2 99%   BMI 27.47 kg/m   Physical Exam  Constitutional: He is oriented to person, place, and time.  Appears sickly, but not distressed.  HENT:  Head: Normocephalic and atraumatic.  Neck: Neck supple. No JVD present.  Cardiovascular: Normal rate, regular rhythm and normal heart sounds. Exam reveals no gallop and no friction rub.  No murmur heard. Pulmonary/Chest: Effort normal and breath sounds normal. No stridor. No respiratory distress. He has no wheezes. He has no rales.  Abdominal: He  exhibits distension. Bowel sounds are decreased. There is tenderness in the right lower quadrant. There is no rigidity, no rebound and no guarding.    Protuberant, firm area over roughly 10-15 cm rounded area in RLQ with mild focal tenderness.  Musculoskeletal: He exhibits no edema.  Lymphadenopathy:    He has no cervical adenopathy.  Neurological: He is alert and oriented to person, place, and time.  Skin: Skin is warm. He is diaphoretic. There is pallor.  Vitals reviewed.   Imaging: Dg Chest 2 View  Result Date: 03/18/2018 CLINICAL DATA:  Productive cough for 2 weeks EXAM: CHEST - 2 VIEW COMPARISON:  09/26/2017 FINDINGS:  Oval opacity in the right lung base. No pleural effusion. Normal heart size. Aortic atherosclerosis. No pneumothorax. Degenerative changes of the spine. IMPRESSION: Oval opacity in the right base may reflect infiltrate. Radiographic follow-up in 3-4 weeks following antibiotic trial is recommended. Electronically Signed   By: Donavan Foil M.D.   On: 03/18/2018 17:09   Ct Abdomen Pelvis W Contrast  Result Date: 03/25/2018 CLINICAL DATA:  Postoperative abdominal pain and tenderness. Cough. Three weeks status post appendectomy. EXAM: CT ABDOMEN AND PELVIS WITH CONTRAST TECHNIQUE: Multidetector CT imaging of the abdomen and pelvis was performed using the standard protocol following bolus administration of intravenous contrast. CONTRAST:  166mL ISOVUE-300 IOPAMIDOL (ISOVUE-300) INJECTION 61% COMPARISON:  03/03/2018 FINDINGS: Lower Chest: No acute findings. Hepatobiliary: No hepatic masses identified. Gallbladder is unremarkable. Pancreas:  No mass or inflammatory changes. Spleen: Within normal limits in size and appearance. Adrenals/Urinary Tract: No masses identified. No evidence of hydronephrosis. Stomach/Bowel: Postop changes from appendectomy. A multilocular rim enhancing fluid collection is seen in the right lower quadrant which measures 14 x 3 x 10.5 cm, consistent with abscess.  There is adjacent reactive wall thickening of the right colon. No evidence of small bowel obstruction. Vascular/Lymphatic: No pathologically enlarged lymph nodes. No abdominal aortic aneurysm. Aortic atherosclerosis. Reproductive:  No mass or other significant abnormality. Other:  None. Musculoskeletal:  No suspicious bone lesions identified. IMPRESSION: 14 cm multilocular rim enhancing fluid collection in right lower quadrant, consistent with abscess. Consider interventional radiology consultation for percutaneous catheter drainage. Adjacent reactive wall thickening of the ascending colon. Electronically Signed   By: Earle Gell M.D.   On: 03/25/2018 13:59   Ct Abdomen Pelvis W Contrast  Result Date: 03/03/2018 CLINICAL DATA:  Right lower quadrant pain beginning yesterday, progressively worsening. Fever. EXAM: CT ABDOMEN AND PELVIS WITH CONTRAST TECHNIQUE: Multidetector CT imaging of the abdomen and pelvis was performed using the standard protocol following bolus administration of intravenous contrast. CONTRAST:  121mL ISOVUE-300 IOPAMIDOL (ISOVUE-300) INJECTION 61% COMPARISON:  None. FINDINGS: Lower chest: No acute abnormality. Hepatobiliary: Decreased attenuation of the liver consistent with fatty infiltration. No mass or focal lesion. Normal gallbladder. No bile duct dilation Pancreas: Unremarkable. No pancreatic ductal dilatation or surrounding inflammatory changes. Spleen: Normal in size without focal abnormality. Adrenals/Urinary Tract: Adrenal glands are unremarkable. Kidneys are normal, without renal calculi, focal lesion, or hydronephrosis. Bladder is unremarkable. Stomach/Bowel: Appendix is dilated to 13 mm. There is surrounding phlegm a tori stranding. No extraluminal air. No periappendiceal fluid collection is seen to suggest an abscess. Stomach, small bowel and colon are unremarkable. Vascular/Lymphatic: Atherosclerotic calcifications are noted throughout a normal caliber abdominal aorta. No  pathologically enlarged lymph nodes. Reproductive: Mild enlargement of prostate gland measuring 4.5 cm transversely. Other: No hernia.  No ascites. Musculoskeletal: No fracture or acute finding. No osteoblastic or osteolytic lesions. IMPRESSION: 1. Acute appendicitis. No extraluminal air. No periappendiceal abscess. 2. No other acute findings. 3. Hepatic steatosis. 4. Aortic atherosclerosis. Electronically Signed   By: Lajean Manes M.D.   On: 03/03/2018 14:17    Labs:  CBC: Recent Labs    09/18/17 1039 09/19/17 0338 03/03/18 1155 03/26/18 0919  WBC 7.0 8.6 9.9 12.3*  HGB 13.9 12.4* 12.9* 7.8*  HCT 41.3 36.4* 37.8* 23.8*  PLT 196 174 217 621*    COAGS: Recent Labs    09/18/17 1039 09/19/17 0338 03/03/18 1702 03/26/18 0919  INR 0.96 0.96 1.03 1.28  APTT 25 28 30   --     BMP: Recent Labs  08/08/17 1118 09/18/17 1039 09/19/17 0338 03/03/18 1155  NA 138 137 137 139  K 4.2 4.6 3.9 3.8  CL 103 105 104 102  CO2 24 22 23 24   GLUCOSE 135* 141* 120* 145*  BUN 20 29* 24* 13  CALCIUM 8.7* 8.8* 8.8* 8.8*  CREATININE 1.16 1.23 1.14 1.00  GFRNONAA 59* 55* >60 >60  GFRAA >60 >60 >60 >60    LIVER FUNCTION TESTS: Recent Labs    09/19/17 0338  BILITOT 1.1  AST 32  ALT 26  ALKPHOS 52  PROT 6.3*  ALBUMIN 3.8    Assessment and Plan:  For planned CT guided RLQ post-op catheter abscess drainage today. Risks and benefits discussed with the patient including bleeding, infection, damage to adjacent structures, bowel perforation/fistula connection, and sepsis. All of the patient's questions were answered, patient is agreeable to proceed. Consent signed and in chart.  Patient has yet to start antibiotics.  Will give dose of IV Zosyn today prior to procedure.  He looks somewhat ill today and WBC slightly elevated at 12.3, temp 100.3.  If he looks toxic after the procedure, may have to be admitted.  Thank you for this interesting consult.  I greatly enjoyed meeting EBERARDO DEMELLO and look forward to participating in their care.  A copy of this report was sent to the requesting provider on this date.  Electronically Signed: Azzie Roup, MD 03/26/2018, 9:52 AM     I spent a total of 30 Minutes in face to face in clinical consultation, greater than 50% of which was counseling/coordinating care for abscess drainage.

## 2018-03-26 NOTE — Discharge Instructions (Signed)
Moderate Conscious Sedation, Adult, Care After °These instructions provide you with information about caring for yourself after your procedure. Your health care provider may also give you more specific instructions. Your treatment has been planned according to current medical practices, but problems sometimes occur. Call your health care provider if you have any problems or questions after your procedure. °What can I expect after the procedure? °After your procedure, it is common: °· To feel sleepy for several hours. °· To feel clumsy and have poor balance for several hours. °· To have poor judgment for several hours. °· To vomit if you eat too soon. ° °Follow these instructions at home: °For at least 24 hours after the procedure: ° °· Do not: °? Participate in activities where you could fall or become injured. °? Drive. °? Use heavy machinery. °? Drink alcohol. °? Take sleeping pills or medicines that cause drowsiness. °? Make important decisions or sign legal documents. °? Take care of children on your own. °· Rest. °Eating and drinking °· Follow the diet recommended by your health care provider. °· If you vomit: °? Drink water, juice, or soup when you can drink without vomiting. °? Make sure you have little or no nausea before eating solid foods. °General instructions °· Have a responsible adult stay with you until you are awake and alert. °· Take over-the-counter and prescription medicines only as told by your health care provider. °· If you smoke, do not smoke without supervision. °· Keep all follow-up visits as told by your health care provider. This is important. °Contact a health care provider if: °· You keep feeling nauseous or you keep vomiting. °· You feel light-headed. °· You develop a rash. °· You have a fever. °Get help right away if: °· You have trouble breathing. °This information is not intended to replace advice given to you by your health care provider. Make sure you discuss any questions you have  with your health care provider. °Document Released: 05/27/2013 Document Revised: 01/09/2016 Document Reviewed: 11/26/2015 °Elsevier Interactive Patient Education © 2018 [employer] °Percutaneous Abscess Drain, Care After °This sheet gives you information about how to care for yourself after your procedure. Your health care provider may also give you more specific instructions. If you have problems or questions, contact your health care provider. °What can I expect after the procedure? °After your procedure, it is common to have: °· A small amount of bruising and discomfort in the area where the drainage tube (catheter) was placed. °· Sleepiness and fatigue. This should go away after the medicines you were given have worn off. ° °Follow these instructions at home: °Incision care °· Follow instructions from your health care provider about how to take care of your incision. Make sure you: °? Wash your hands with soap and water before you change your bandage (dressing). If soap and water are not available, use hand sanitizer. °? Change your dressing as told by your health care provider. °? Leave stitches (sutures), skin glue, or adhesive strips in place. These skin closures may need to stay in place for 2 weeks or longer. If adhesive strip edges start to loosen and curl up, you may trim the loose edges. Do not remove adhesive strips completely unless your health care provider tells you to do that. °· Check your incision area every day for signs of infection. Check for: °? More redness, swelling, or pain. °? More fluid or blood. °? Warmth. °? Pus or a bad smell. °? Fluid leaking from around   your catheter (instead of fluid draining through your catheter). °Catheter care °· Follow instructions from your health care provider about emptying and cleaning your catheter and collection bag. You may need to clean the catheter every day so it does not clog. °· If directed, write down the following information every time you  empty your bag: °? The date and time. °? The amount of drainage. °General instructions °· Rest at home for 1-2 days after your procedure. Return to your normal activities as told by your health care provider. °· Do not take baths, swim, or use a hot tub for 24 hours after your procedure, or until your health care provider says that this is okay. °· Take over-the-counter and prescription medicines only as told by your health care provider. °· Keep all follow-up visits as told by your health care provider. This is important. °Contact a health care provider if: °· You have less than 10 mL of drainage a day for 2-3 days in a row, or as directed by your health care provider. °· You have more redness, swelling, or pain around your incision area. °· You have more fluid or blood coming from your incision area. °· Your incision area feels warm to the touch. °· You have pus or a bad smell coming from your incision area. °· You have fluid leaking from around your catheter (instead of through your catheter). °· You have a fever or chills. °· You have pain that does not get better with medicine. °Get help right away if: °· Your catheter comes out. °· You suddenly stop having drainage from your catheter. °· You suddenly have blood in the fluid that is draining from your catheter. °· You become dizzy or you faint. °· You develop a rash. °· You have nausea or vomiting. °· You have difficulty breathing or you feel short of breath. °· You develop chest pain. °· You have problems with your speech or vision. °· You have trouble balancing or moving your arms or legs. °Summary °· It is common to have a small amount of bruising and discomfort in the area where the drainage tube (catheter) was placed. °· You may be directed to record the amount of drainage from the bag every time you empty it. °· Follow instructions from your health care provider about emptying and cleaning your catheter and collection bag. °This information is not  intended to replace advice given to you by your health care provider. Make sure you discuss any questions you have with your health care provider. °Document Released: 12/21/2013 Document Revised: 06/28/2016 Document Reviewed: 06/28/2016 °Elsevier Interactive Patient Education © 2017 Elsevier Inc. ° °

## 2018-03-31 ENCOUNTER — Telehealth: Payer: Self-pay | Admitting: Surgery

## 2018-03-31 NOTE — Telephone Encounter (Signed)
Patient is calling and just has some questions, patient is confused on what he needs to do. Please call patient and advise.

## 2018-03-31 NOTE — Telephone Encounter (Signed)
Spoke with patient at this time. Patient

## 2018-04-01 ENCOUNTER — Encounter: Payer: Self-pay | Admitting: Surgery

## 2018-04-01 ENCOUNTER — Ambulatory Visit (INDEPENDENT_AMBULATORY_CARE_PROVIDER_SITE_OTHER): Payer: Medicare Other | Admitting: Surgery

## 2018-04-01 VITALS — BP 105/65 | HR 106 | Temp 98.8°F | Ht 69.0 in | Wt 180.0 lb

## 2018-04-01 DIAGNOSIS — K651 Peritoneal abscess: Secondary | ICD-10-CM

## 2018-04-01 DIAGNOSIS — Z4889 Encounter for other specified surgical aftercare: Secondary | ICD-10-CM | POA: Diagnosis not present

## 2018-04-01 MED ORDER — CIPROFLOXACIN HCL 500 MG PO TABS: 500.0000 mg | ORAL_TABLET | Freq: Two times a day (BID) | ORAL | 0 refills | Status: AC

## 2018-04-01 NOTE — Progress Notes (Signed)
Surgical Clinic Progress/Follow-up Note   HPI:  77 y.o. Male presents to clinic for follow-up evaluation 6 days s/p image (CT)-guided drainage of RLQ peritoneal abscess, 1 month s/p laparoscopic appendectomy Rosana Hoes, 03/03/2018). Patient reported at his post-surgical follow-up evaluation complete resolution of his pre-operative and peri-incisional pain, but had described persistent cough with some hypoxia on home pulse oximetry monitoring. Chest x-ray was performed, and patient was accordingly diagnosed with pneumonia and started by his primary care physician on antibiotic therapy. At patient's follow-up appointment with his primary care physician, patient began to again complain of Right-sided abdominal pain with a palpable intra-abdominal mass, for which abdominal CT was performed, which demonstrated RLQ abscess, and labs showed mild leukocytosis. Image-guided abscess drainage was promptly ordered, discussed with interventional radiologist, and performed.  Today, patient reports his coughing is "90% better", and he again denies any abdominal pain, even at his drain insertion site. He also states the firm RLQ abdominal "mass" appreciated by patient's primary care physician appears to have nearly resolved. He, however, continues to empty 20 - 50 mL purulent burgundy-colored fluid from drain per day. Patient's wife describes she cleans the drain site with alcohol wipes and applies dry gauze dressing daily. Patient otherwise reports he has been tolerating his regular diet with +flatus and +BM's WNL, denies any fever/chills, N/V, CP, or SOB.  Review of Systems:  Constitutional: denies fever/chills  Respiratory: denies shortness of breath, wheezing  Cardiovascular: denies chest pain, palpitations  Gastrointestinal: abdominal pain, N/V, and bowel function as per interval history Skin: Denies any other rashes or skin discolorations except post-surgical wounds as per interval history  Vital Signs:  BP  105/65   Pulse (!) 106   Temp 98.8 F (37.1 C) (Oral)   Ht 5\' 9"  (1.753 m)   Wt 180 lb (81.6 kg)   BMI 26.58 kg/m    Physical Exam:  Constitutional:  -- Normal body habitus  -- Awake, alert, and oriented x3  Pulmonary:  -- No crackles -- Equal breath sounds bilaterally -- Breathing non-labored at rest Cardiovascular:  -- S1, S2 present  -- No pericardial rubs  Gastrointestinal:  -- Soft and non-distended, non-tender to palpation, no guarding/rebound tenderness -- Post-surgical incisions all well-approximated without any peri-incisional erythema or drainage -- Drain well-secured with minimal peri-drain site erythema and small amount of purulent fluid leakage around drain site that patient's wife describes as the only time she's seen such since drain was inserted -- No abdominal masses appreciated, pulsatile or otherwise  Musculoskeletal / Integumentary:  -- Wounds or skin discoloration: None appreciated except post-surgical incisions as described above (GI) -- Extremities: B/L UE and LE FROM, hands and feet warm, no edema   Assessment:  77 y.o. yo Male with a problem list including...  Patient Active Problem List   Diagnosis Date Noted  . History of pneumothorax 11/07/2017  . Pneumothorax 09/18/2017  . Thoracic aortic aneurysm without rupture ([place]) 08/16/2017  . Primary osteoarthritis of right knee 07/10/2017  . Spinal stenosis of lumbar region with radiculopathy 07/10/2017  . GERD (gastroesophageal reflux disease) 06/13/2016  . Gout 06/13/2016  . Hyperlipidemia, unspecified 06/13/2016  . Hypertension 06/13/2016  . Pneumothorax on right 06/03/2016  . Elevated PSA 03/29/2016  . Elevated blood sugar 09/29/2015  . Rash 02/17/2015  . OSA (obstructive sleep apnea) 07/08/2014  . S/P cardiac catheterization 05/28/2014  . SOB (shortness of breath) on exertion 05/28/2014    presents to clinic follow-up evaluation 6 days s/p image (CT)-guided drainage of RLQ peritoneal abscess,  1 month s/p laparoscopic appendectomy Rosana Hoes, 03/03/2018), clinically doing overall well.  Plan:              - discussed imaging and culture results with patient and his wife  - antibiotics extended with ciprofloxacin on basis of cultures x additional 10 days, considering ongoing purulent drainage  - return to clinic next week (as scheduled), instructed to call office if any questions or concerns             - drain care instructions and maintenance of drainage log reviewed  All of the above recommendations were discussed with the patient and patient's wife, and all of patient's and family's questions were answered to their expressed satisfaction.  -- Marilynne Drivers Rosana Hoes, MD, [place]: [facility] - Partnering for exceptional care. Office: [phone]

## 2018-04-01 NOTE — Patient Instructions (Addendum)
Please pick up your medication at the pharmacy.   Please see your follow up appointment listed below. Please continue to clean around the drain site and apply a dressing to the area daily.

## 2018-04-02 LAB — AEROBIC/ANAEROBIC CULTURE W GRAM STAIN (SURGICAL/DEEP WOUND): Special Requests: NORMAL

## 2018-04-02 LAB — AEROBIC/ANAEROBIC CULTURE (SURGICAL/DEEP WOUND)

## 2018-04-10 ENCOUNTER — Ambulatory Visit (INDEPENDENT_AMBULATORY_CARE_PROVIDER_SITE_OTHER): Payer: Medicare Other | Admitting: Surgery

## 2018-04-10 ENCOUNTER — Encounter: Payer: Self-pay | Admitting: Surgery

## 2018-04-10 VITALS — BP 177/78 | HR 114 | Temp 98.4°F | Wt 178.0 lb

## 2018-04-10 DIAGNOSIS — Z4889 Encounter for other specified surgical aftercare: Secondary | ICD-10-CM | POA: Diagnosis not present

## 2018-04-10 NOTE — Progress Notes (Signed)
Surgical Clinic Progress/Follow-up Note   HPI:  77 y.o. Male presents to clinic for follow-up evaluation 15 Days s/p image (CT)-guided drainage of RLQ peritoneal abscess 1 month s/p laparoscopic appendectomy Rosana Hoes, 03/03/2018). Patient reports only 15 mL per day (much decreased) less cloudy - clear burgundy fluid per day, denies pain, and he continues to describe improved cough without CP, SOB, fever/chills, or N/V, and he has been tolerating regular diet with +flatus and normal BM. Patient and his wife verify he has been taking his antibiotics (Cipro and Flagyl) and state he has 5 and a half days left of Cipro with 11 Days of Flagyl.  Review of Systems:  Constitutional: denies fever/chills  Respiratory: denies shortness of breath, wheezing  Cardiovascular: denies chest pain, palpitations  Gastrointestinal: abdominal pain, N/V, and bowel function as per interval history Skin: Denies any other rashes or skin discolorations except post-surgical wounds as per interval history  Vital Signs:  BP (!) 177/78   Pulse (!) 114   Temp 98.4 F (36.9 C) (Oral)   Wt 178 lb (80.7 kg)   BMI 26.29 kg/m    Physical Exam:  Constitutional:  -- Normal body habitus  -- Awake, alert, and oriented x3  Pulmonary:  -- No crackles -- Equal breath sounds bilaterally -- Breathing non-labored at rest Cardiovascular:  -- S1, S2 present  -- No pericardial rubs  Gastrointestinal:  -- Soft and non-distended, non-tender to palpation, no guarding/rebound tenderness -- Focal tender erythema at RLQ drain insertion site, does not extend beyond drain insertion site -- Post-surgical incisions all well-approximated without any peri-incisional erythema or drainage -- No abdominal masses appreciated, pulsatile or otherwise Musculoskeletal / Integumentary:  -- Wounds or skin discoloration: None appreciated except post-surgical incisions as described above (GI) -- Extremities: B/L UE and LE FROM, hands and feet warm,  no edema   Assessment:  77 y.o. yo Male with a problem list including...  Patient Active Problem List   Diagnosis Date Noted  . History of pneumothorax 11/07/2017  . Pneumothorax 09/18/2017  . Thoracic aortic aneurysm without rupture ([place]) 08/16/2017  . Primary osteoarthritis of right knee 07/10/2017  . Spinal stenosis of lumbar region with radiculopathy 07/10/2017  . GERD (gastroesophageal reflux disease) 06/13/2016  . Gout 06/13/2016  . Hyperlipidemia, unspecified 06/13/2016  . Hypertension 06/13/2016  . Pneumothorax on right 06/03/2016  . Elevated PSA 03/29/2016  . Elevated blood sugar 09/29/2015  . Rash 02/17/2015  . OSA (obstructive sleep apnea) 07/08/2014  . S/P cardiac catheterization 05/28/2014  . SOB (shortness of breath) on exertion 05/28/2014    presents to clinic for follow-up evaluation, doing well 15 Days s/p image (CT)-guided drainage of RLQ peritoneal abscess 1 month s/p laparoscopic appendectomy Rosana Hoes, 03/03/2018).  Plan:              - drain removed  - complete Cipro x 6 more days along with Flagyl (stop both when Cipro completed)             - keep drain removal site clean and dry x 2 days, after which may remove dry gauze dressing and shower             - return to clinic once more next week, instructed to call office if any questions or concerns  - anticipate will resume prior anti-HTN medications (stopped just prior to appendicitis due to hypotension)  - follow up with primary care regarding HTN with mild tachycardia  All of the above recommendations were discussed with the patient  and patient's wife, and all of patient's and family's questions were answered to their expressed satisfaction.  -- Marilynne Drivers Rosana Hoes, MD, [place]: [facility] - Partnering for exceptional care. Office: [phone]

## 2018-04-10 NOTE — Patient Instructions (Addendum)
Please talk to Dr. Ginette Pitman in reference to your blood pressure and if you are needing to restart your blood pressure medication.  Please finish all of your Cipro and Flagyl.  We will see you back in one week to make sure that you are doing better.

## 2018-04-13 ENCOUNTER — Encounter: Payer: Self-pay | Admitting: Surgery

## 2018-04-17 ENCOUNTER — Ambulatory Visit (INDEPENDENT_AMBULATORY_CARE_PROVIDER_SITE_OTHER): Payer: Medicare Other | Admitting: Surgery

## 2018-04-17 ENCOUNTER — Encounter: Payer: Self-pay | Admitting: Surgery

## 2018-04-17 VITALS — BP 168/75 | HR 109 | Temp 97.7°F | Resp 14 | Ht 68.0 in | Wt 181.0 lb

## 2018-04-17 DIAGNOSIS — T8149XA Infection following a procedure, other surgical site, initial encounter: Secondary | ICD-10-CM

## 2018-04-17 DIAGNOSIS — Z4889 Encounter for other specified surgical aftercare: Secondary | ICD-10-CM | POA: Diagnosis not present

## 2018-04-17 DIAGNOSIS — T8143XA Infection following a procedure, organ and space surgical site, initial encounter: Secondary | ICD-10-CM

## 2018-04-17 NOTE — Patient Instructions (Addendum)
Complete your antibiotics You can call us if you have any questions or problems, pain, fever, nausea and vomiting. Follow up as needed.

## 2018-04-17 NOTE — Progress Notes (Signed)
Surgical Clinic Progress/Follow-up Note   HPI:  77 y.o. Male presents to clinic for follow-up evaluation 22 Days s/p image (CT)-guided drainage of RLQ peritoneal abscess 6 weeks s/p laparoscopic appendectomy Rosana Hoes, 03/03/2018). Patient reports he feels well with improved energy, no abdominal pain, +flatus, +BM's WNL, and denies productive cough, CP, SOB, fever/chills, or N/V. Patient says he has one day left of antibiotics.  Review of Systems:  Constitutional: denies fever/chills  Respiratory: denies shortness of breath, wheezing  Cardiovascular: denies chest pain, palpitations  Gastrointestinal: abdominal pain, N/V, and bowel function as per interval history Skin: Denies any other rashes or skin discolorations except post-surgical wounds as per interval history  Vital Signs:  BP (!) 168/75   Pulse (!) 109   Temp 97.7 F (36.5 C)   Resp 14   Ht 5\' 8"  (1.727 m)   Wt 181 lb (82.1 kg)   BMI 27.52 kg/m    Physical Exam:  Constitutional:  -- Normal body habitus  -- Awake, alert, and oriented x3  Pulmonary:  -- No crackles -- Equal breath sounds bilaterally -- Breathing non-labored at rest Cardiovascular:  -- S1, S2 present  -- No pericardial rubs  Gastrointestinal:  -- Soft and non-distended, non-tender to palpation, no guarding/rebound tenderness -- Post-surgical incisions all well-approximated without any peri-incisional erythema or drainage -- No abdominal masses appreciated, pulsatile or otherwise  Musculoskeletal / Integumentary:  -- Wounds or skin discoloration: None appreciated except post-surgical incisions as described above (GI) -- Extremities: B/L UE and LE FROM, hands and feet warm, no edema   Assessment:  77 y.o. yo Male with a problem list including...  Patient Active Problem List   Diagnosis Date Noted  . History of pneumothorax 11/07/2017  . Pneumothorax 09/18/2017  . Thoracic aortic aneurysm without rupture ([place]) 08/16/2017  . Primary osteoarthritis of  right knee 07/10/2017  . Spinal stenosis of lumbar region with radiculopathy 07/10/2017  . GERD (gastroesophageal reflux disease) 06/13/2016  . Gout 06/13/2016  . Hyperlipidemia, unspecified 06/13/2016  . Hypertension 06/13/2016  . Pneumothorax on right 06/03/2016  . Elevated PSA 03/29/2016  . Elevated blood sugar 09/29/2015  . Rash 02/17/2015  . OSA (obstructive sleep apnea) 07/08/2014  . S/P cardiac catheterization 05/28/2014  . SOB (shortness of breath) on exertion 05/28/2014    presents to clinic for follow-up evaluation, doing well 22 Days s/p image (CT)-guided drainage of RLQ peritoneal abscess 6 weeks s/p laparoscopic appendectomy Rosana Hoes, 03/03/2018) for acute gangrenous tip appendicitis.  Plan:              - heart-healthy diet as tolerated              - resume all activities without restrictions             - okay to submerge incisions under water (baths, swimming) prn             - apply sunblock particularly to incisions with sun exposure to reduce pigmentation of scars             - return to clinic as needed, instructed to call office if any questions or concerns  All of the above recommendations were discussed with the patient, and all of patient's questions were answered to his expressed satisfaction.  -- Marilynne Drivers Rosana Hoes, MD, [place]: [facility] - Partnering for exceptional care. Office: [phone]

## 2018-08-01 ENCOUNTER — Ambulatory Visit: Payer: Medicare Other | Admitting: Urology

## 2018-08-04 ENCOUNTER — Ambulatory Visit: Payer: Medicare Other | Admitting: Urology

## 2018-09-03 ENCOUNTER — Other Ambulatory Visit: Payer: Self-pay

## 2018-09-03 ENCOUNTER — Encounter
Admission: RE | Admit: 2018-09-03 | Discharge: 2018-09-03 | Disposition: A | Discharge status: Home / Self Care | Payer: Medicare Other | Source: Ambulatory Visit | Attending: Surgery | Admitting: Surgery

## 2018-09-03 DIAGNOSIS — M1712 Unilateral primary osteoarthritis, left knee: Secondary | ICD-10-CM | POA: Insufficient documentation

## 2018-09-03 DIAGNOSIS — I10 Essential (primary) hypertension: Secondary | ICD-10-CM | POA: Insufficient documentation

## 2018-09-03 DIAGNOSIS — R9431 Abnormal electrocardiogram [ECG] [EKG]: Secondary | ICD-10-CM | POA: Insufficient documentation

## 2018-09-03 DIAGNOSIS — G4733 Obstructive sleep apnea (adult) (pediatric): Secondary | ICD-10-CM | POA: Insufficient documentation

## 2018-09-03 DIAGNOSIS — Z01812 Encounter for preprocedural laboratory examination: Secondary | ICD-10-CM | POA: Diagnosis present

## 2018-09-03 HISTORY — DX: Gastro-esophageal reflux disease without esophagitis: K21.9

## 2018-09-03 HISTORY — DX: Unspecified osteoarthritis, unspecified site: M19.90

## 2018-09-03 LAB — TYPE AND SCREEN
ABO/RH(D): O POS
Antibody Screen: NEGATIVE

## 2018-09-03 LAB — URINALYSIS, ROUTINE W REFLEX MICROSCOPIC
BILIRUBIN URINE: NEGATIVE
Glucose, UA: NEGATIVE mg/dL
Hgb urine dipstick: NEGATIVE
KETONES UR: NEGATIVE mg/dL
Leukocytes, UA: NEGATIVE
Nitrite: NEGATIVE
Protein, ur: NEGATIVE mg/dL
SPECIFIC GRAVITY, URINE: 1.014 (ref 1.005–1.030)
pH: 6 (ref 5.0–8.0)

## 2018-09-03 LAB — APTT: aPTT: 29 seconds (ref 24–36)

## 2018-09-03 LAB — CBC
HCT: 34.4 % — ABNORMAL LOW (ref 39.0–52.0)
HEMOGLOBIN: 11.3 g/dL — AB (ref 13.0–17.0)
MCH: 30.3 pg (ref 26.0–34.0)
MCHC: 32.8 g/dL (ref 30.0–36.0)
MCV: 92.2 fL (ref 80.0–100.0)
PLATELETS: 345 10*3/uL (ref 150–400)
RBC: 3.73 MIL/uL — AB (ref 4.22–5.81)
RDW: 14.6 % (ref 11.5–15.5)
WBC: 8.7 10*3/uL (ref 4.0–10.5)
nRBC: 0 % (ref 0.0–0.2)

## 2018-09-03 LAB — PROTIME-INR
INR: 0.93
PROTHROMBIN TIME: 12.4 s (ref 11.4–15.2)

## 2018-09-03 LAB — SURGICAL PCR SCREEN
MRSA, PCR: NEGATIVE
Staphylococcus aureus: NEGATIVE

## 2018-09-03 LAB — BASIC METABOLIC PANEL
Anion gap: 9 (ref 5–15)
BUN: 13 mg/dL (ref 8–23)
CALCIUM: 8.8 mg/dL — AB (ref 8.9–10.3)
CO2: 25 mmol/L (ref 22–32)
Chloride: 106 mmol/L (ref 98–111)
Creatinine, Ser: 0.95 mg/dL (ref 0.61–1.24)
GFR calc Af Amer: >60 mL/min (ref >60)
Glucose, Bld: 118 mg/dL — ABNORMAL HIGH (ref 70–99)
POTASSIUM: 4 mmol/L (ref 3.5–5.1)
SODIUM: 140 mmol/L (ref 135–145)

## 2018-09-03 LAB — SEDIMENTATION RATE: SED RATE: 49 mm/h — AB (ref 0–20)

## 2018-09-03 NOTE — Patient Instructions (Addendum)
  Your procedure is scheduled on: Tuesday September 16, 2018 Report to Same Day Surgery 2nd floor [facility] Entrance-take elevator on left to 2nd floor.  Check in with surgery information desk.) To find out your arrival time, call [phone] 1:00-3:00 PM on Monday September 15, 2018  Remember: Instructions that are not followed completely may result in serious medical risk, up to and including death, or upon the discretion of your surgeon and anesthesiologist your surgery may need to be rescheduled.    __x__ 1. Do not eat food (including mints, candies, chewing gum) after midnight the night before your procedure. You may drink clear liquids up to 2 hours before you are scheduled to arrive at the hospital for your procedure.  Do not drink anything within 2 hours of your scheduled arrival to the hospital.  Approved clear liquids:  --Water or Apple juice without pulp  --Clear carbohydrate beverage such as Gatorade or Powerade  --Black Coffee or Clear Tea (No milk, no creamers, do not add anything to the coffee or tea)    __x__ 2. No Alcohol for 24 hours before or after surgery.   __x__ 3. No Smoking or e-cigarettes for 24 hours before surgery.  Do not use any chewable tobacco products for at least 6 hours before surgery.   __x__ 4. Notify your doctor if there is any change in your medical condition (cold, fever, infections).   __x__ 5. On the morning of surgery brush your teeth with toothpaste and water.  You may rinse your mouth with mouthwash if you wish.  Do not swallow any toothpaste or mouthwash.  Please read over the following fact sheets that you were given:   [facility] Preparing for Surgery and/or MRSA Information    __x__ Use CHG Soap or Sage wipes as directed on instruction sheet   Do not wear jewelry on the day of surgery.  Do not wear lotions, powders, deodorant, or perfumes.   Do not shave below the face/neck 48 hours prior to surgery.   Do not bring  valuables to the hospital.    [facility] is not responsible for any belongings or valuables.               Contacts, dentures or bridgework may not be worn into surgery.  Leave your suitcase in the car. After surgery it may be brought to your room.  For patients admitted to the hospital, discharge time is determined by your treatment team.  __x__ Take these medicines on the morning of surgery with a SMALL SIP OF WATER:  1. Lansoprazole (Prevacid)  2. Allopurinol (Zyloprim)  3. Gabapentin (Neurontin)  4. Hydralazine (Apresoline)  __x__ Do NOT take your Lisinopril (Prinivil) on the morning of surgery.  No need to stop ahead of time.  __x__ Use inhalers (Albuterol/Ventolin, Symbicort) on the day of surgery and bring them with you to the hospital.  __x__ Bring C-Pap machine to the hospital.   __x__ Follow recommendations from Cardiologist, Pulmonologist or PCP regarding stopping blood thinners such as  Aspirin, Coumadin, Plavix, Eliquis, Effient, Pradaxa, and Pletal.  __x__ At least 7 days prior to surgery: Stop Anti-inflammatories such as Advil, Ibuprofen, Motrin, Aleve, Naproxen, Naprosyn, BC/Goodies powders or aspirin products. You may continue to take Tylenol and Celebrex.   __x__ At least 7 days prior to surgery: Stop supplements (Megared) until after surgery. You may continue to take Vitamin D, Vitamin B, and multivitamin.

## 2018-09-03 NOTE — Pre-Procedure Instructions (Signed)
Lab results (CBC, BMP, ESR) from PAT visit faxed to Dr. Nicholaus Bloom office.

## 2018-09-04 LAB — URINE CULTURE: Culture: NO GROWTH

## 2018-09-15 MED ORDER — CEFAZOLIN SODIUM-DEXTROSE 2-4 GM/100ML-% IV SOLN
2.0000 g | Freq: Once | INTRAVENOUS | Status: AC
  Administered 2018-09-16: 2 g via INTRAVENOUS

## 2018-09-16 ENCOUNTER — Inpatient Hospital Stay: Payer: Medicare Other | Admitting: Certified Registered Nurse Anesthetist

## 2018-09-16 ENCOUNTER — Inpatient Hospital Stay: Payer: Medicare Other

## 2018-09-16 ENCOUNTER — Other Ambulatory Visit: Payer: Self-pay

## 2018-09-16 ENCOUNTER — Encounter: Payer: Self-pay | Admitting: *Deleted

## 2018-09-16 ENCOUNTER — Inpatient Hospital Stay
Admission: RE | Admit: 2018-09-16 | Discharge: 2018-09-18 | DRG: 470 | Disposition: A | Discharge status: Home Health | Payer: Medicare Other | Attending: Surgery | Admitting: Surgery

## 2018-09-16 ENCOUNTER — Encounter
Admission: RE | Disposition: A | Discharge status: Home Health | Payer: Self-pay | Source: Home / Self Care | Attending: Surgery

## 2018-09-16 DIAGNOSIS — I2510 Atherosclerotic heart disease of native coronary artery without angina pectoris: Secondary | ICD-10-CM | POA: Diagnosis present

## 2018-09-16 DIAGNOSIS — Z7951 Long term (current) use of inhaled steroids: Secondary | ICD-10-CM

## 2018-09-16 DIAGNOSIS — M109 Gout, unspecified: Secondary | ICD-10-CM | POA: Diagnosis present

## 2018-09-16 DIAGNOSIS — Z87891 Personal history of nicotine dependence: Secondary | ICD-10-CM

## 2018-09-16 DIAGNOSIS — Z79899 Other long term (current) drug therapy: Secondary | ICD-10-CM

## 2018-09-16 DIAGNOSIS — I10 Essential (primary) hypertension: Secondary | ICD-10-CM | POA: Diagnosis present

## 2018-09-16 DIAGNOSIS — Z7982 Long term (current) use of aspirin: Secondary | ICD-10-CM | POA: Diagnosis not present

## 2018-09-16 DIAGNOSIS — E785 Hyperlipidemia, unspecified: Secondary | ICD-10-CM | POA: Diagnosis present

## 2018-09-16 DIAGNOSIS — M1712 Unilateral primary osteoarthritis, left knee: Principal | ICD-10-CM | POA: Diagnosis present

## 2018-09-16 DIAGNOSIS — G4733 Obstructive sleep apnea (adult) (pediatric): Secondary | ICD-10-CM | POA: Diagnosis present

## 2018-09-16 DIAGNOSIS — K219 Gastro-esophageal reflux disease without esophagitis: Secondary | ICD-10-CM | POA: Diagnosis present

## 2018-09-16 DIAGNOSIS — J449 Chronic obstructive pulmonary disease, unspecified: Secondary | ICD-10-CM | POA: Diagnosis present

## 2018-09-16 DIAGNOSIS — Z96652 Presence of left artificial knee joint: Secondary | ICD-10-CM

## 2018-09-16 HISTORY — PX: TOTAL KNEE ARTHROPLASTY: SHX125

## 2018-09-16 SURGERY — ARTHROPLASTY, KNEE, TOTAL
Anesthesia: Spinal | Site: Knee | Laterality: Left

## 2018-09-16 MED ORDER — PANTOPRAZOLE SODIUM 20 MG PO TBEC
20.0000 mg | DELAYED_RELEASE_TABLET | Freq: Every day | ORAL | Status: DC
  Administered 2018-09-17 – 2018-09-18 (×2): 20 mg via ORAL
  Filled 2018-09-16 (×2): qty 1

## 2018-09-16 MED ORDER — BACITRACIN 50000 UNITS IM SOLR
INTRAMUSCULAR | Status: AC
  Filled 2018-09-16: qty 3

## 2018-09-16 MED ORDER — SODIUM CHLORIDE 0.9 % IV SOLN
INTRAVENOUS | Status: DC
  Administered 2018-09-16 – 2018-09-18 (×3): via INTRAVENOUS

## 2018-09-16 MED ORDER — ACETAMINOPHEN 325 MG PO TABS
325.0000 mg | ORAL_TABLET | Freq: Four times a day (QID) | ORAL | Status: DC | PRN
  Administered 2018-09-17: 650 mg via ORAL
  Filled 2018-09-16: qty 2

## 2018-09-16 MED ORDER — LIDOCAINE HCL (PF) 2 % IJ SOLN
INTRAMUSCULAR | Status: AC
  Filled 2018-09-16: qty 10

## 2018-09-16 MED ORDER — KETOROLAC TROMETHAMINE 15 MG/ML IJ SOLN
15.0000 mg | Freq: Once | INTRAMUSCULAR | Status: AC
  Administered 2018-09-16: 15 mg via INTRAVENOUS

## 2018-09-16 MED ORDER — FLEET ENEMA 7-19 GM/118ML RE ENEM: 1.0000 | ENEMA | Freq: Once | RECTAL | Status: DC | PRN

## 2018-09-16 MED ORDER — ONDANSETRON HCL 4 MG PO TABS: 4.0000 mg | ORAL_TABLET | Freq: Four times a day (QID) | ORAL | Status: DC | PRN

## 2018-09-16 MED ORDER — HYDRALAZINE HCL 25 MG PO TABS
25.0000 mg | ORAL_TABLET | Freq: Two times a day (BID) | ORAL | Status: DC
  Administered 2018-09-16 – 2018-09-18 (×4): 25 mg via ORAL
  Filled 2018-09-16 (×5): qty 1

## 2018-09-16 MED ORDER — DIPHENHYDRAMINE HCL 12.5 MG/5ML PO ELIX: 12.5000 mg | ORAL_SOLUTION | ORAL | Status: DC | PRN

## 2018-09-16 MED ORDER — HYDROMORPHONE HCL 1 MG/ML IJ SOLN: 0.2500 mg | INTRAMUSCULAR | Status: DC | PRN

## 2018-09-16 MED ORDER — BISACODYL 10 MG RE SUPP: 10.0000 mg | Freq: Every day | RECTAL | Status: DC | PRN

## 2018-09-16 MED ORDER — DOCUSATE SODIUM 100 MG PO CAPS
100.0000 mg | ORAL_CAPSULE | Freq: Two times a day (BID) | ORAL | Status: DC
  Administered 2018-09-16 – 2018-09-18 (×5): 100 mg via ORAL
  Filled 2018-09-16 (×5): qty 1

## 2018-09-16 MED ORDER — ACETAMINOPHEN 10 MG/ML IV SOLN
INTRAVENOUS | Status: DC | PRN
  Administered 2018-09-16: 1000 mg via INTRAVENOUS

## 2018-09-16 MED ORDER — ONDANSETRON HCL 4 MG/2ML IJ SOLN: 4.0000 mg | Freq: Four times a day (QID) | INTRAMUSCULAR | Status: DC | PRN

## 2018-09-16 MED ORDER — TRANEXAMIC ACID 1000 MG/10ML IV SOLN
INTRAVENOUS | Status: DC | PRN
  Administered 2018-09-16: 1000 mg via INTRAVENOUS

## 2018-09-16 MED ORDER — GLYCOPYRROLATE 0.2 MG/ML IJ SOLN
INTRAMUSCULAR | Status: AC
  Filled 2018-09-16: qty 1

## 2018-09-16 MED ORDER — CEFAZOLIN SODIUM-DEXTROSE 2-4 GM/100ML-% IV SOLN
INTRAVENOUS | Status: AC
  Filled 2018-09-16: qty 100

## 2018-09-16 MED ORDER — SODIUM CHLORIDE FLUSH 0.9 % IV SOLN
INTRAVENOUS | Status: AC
  Filled 2018-09-16: qty 30

## 2018-09-16 MED ORDER — ALBUTEROL SULFATE (2.5 MG/3ML) 0.083% IN NEBU: 2.5000 mg | INHALATION_SOLUTION | Freq: Four times a day (QID) | RESPIRATORY_TRACT | Status: DC | PRN

## 2018-09-16 MED ORDER — KETOROLAC TROMETHAMINE 15 MG/ML IJ SOLN
7.5000 mg | Freq: Four times a day (QID) | INTRAMUSCULAR | Status: AC
  Administered 2018-09-16 – 2018-09-17 (×3): 7.5 mg via INTRAVENOUS
  Filled 2018-09-16 (×4): qty 1

## 2018-09-16 MED ORDER — ALBUTEROL SULFATE HFA 108 (90 BASE) MCG/ACT IN AERS: 2.0000 | INHALATION_SPRAY | Freq: Four times a day (QID) | RESPIRATORY_TRACT | Status: DC | PRN

## 2018-09-16 MED ORDER — SIMVASTATIN 20 MG PO TABS
20.0000 mg | ORAL_TABLET | Freq: Every day | ORAL | Status: DC
  Administered 2018-09-16 – 2018-09-17 (×2): 20 mg via ORAL
  Filled 2018-09-16 (×2): qty 1

## 2018-09-16 MED ORDER — PROPOFOL 10 MG/ML IV BOLUS
INTRAVENOUS | Status: DC | PRN
  Administered 2018-09-16: 30 mg via INTRAVENOUS

## 2018-09-16 MED ORDER — BUPIVACAINE HCL (PF) 0.5 % IJ SOLN
INTRAMUSCULAR | Status: AC
  Filled 2018-09-16: qty 10

## 2018-09-16 MED ORDER — ASPIRIN EC 81 MG PO TBEC
81.0000 mg | DELAYED_RELEASE_TABLET | Freq: Every day | ORAL | Status: DC
  Administered 2018-09-16 – 2018-09-18 (×3): 81 mg via ORAL
  Filled 2018-09-16 (×3): qty 1

## 2018-09-16 MED ORDER — SODIUM CHLORIDE 0.9 % IV SOLN
INTRAVENOUS | Status: DC | PRN
  Administered 2018-09-16: 60 mL

## 2018-09-16 MED ORDER — ACETAMINOPHEN 500 MG PO TABS
1000.0000 mg | ORAL_TABLET | Freq: Four times a day (QID) | ORAL | Status: AC
  Administered 2018-09-16 – 2018-09-17 (×2): 1000 mg via ORAL
  Filled 2018-09-16 (×3): qty 2

## 2018-09-16 MED ORDER — MOMETASONE FURO-FORMOTEROL FUM 100-5 MCG/ACT IN AERO
2.0000 | INHALATION_SPRAY | Freq: Two times a day (BID) | RESPIRATORY_TRACT | Status: DC
  Administered 2018-09-16 – 2018-09-18 (×4): 2 via RESPIRATORY_TRACT
  Filled 2018-09-16: qty 8.8

## 2018-09-16 MED ORDER — SODIUM CHLORIDE 0.9 % IV SOLN
INTRAVENOUS | Status: DC | PRN
  Administered 2018-09-16: 3000 mL

## 2018-09-16 MED ORDER — BUPIVACAINE-EPINEPHRINE (PF) 0.5% -1:200000 IJ SOLN
INTRAMUSCULAR | Status: DC | PRN
  Administered 2018-09-16: 30 mL

## 2018-09-16 MED ORDER — BUPIVACAINE-EPINEPHRINE (PF) 0.5% -1:200000 IJ SOLN
INTRAMUSCULAR | Status: AC
  Filled 2018-09-16: qty 30

## 2018-09-16 MED ORDER — CEFAZOLIN SODIUM-DEXTROSE 2-4 GM/100ML-% IV SOLN
2.0000 g | Freq: Four times a day (QID) | INTRAVENOUS | Status: AC
  Administered 2018-09-16 – 2018-09-17 (×3): 2 g via INTRAVENOUS
  Filled 2018-09-16 (×3): qty 100

## 2018-09-16 MED ORDER — SODIUM CHLORIDE (PF) 0.9 % IJ SOLN
INTRAMUSCULAR | Status: AC
  Filled 2018-09-16: qty 50

## 2018-09-16 MED ORDER — PROPOFOL 500 MG/50ML IV EMUL
INTRAVENOUS | Status: AC
  Filled 2018-09-16: qty 50

## 2018-09-16 MED ORDER — ALLOPURINOL 100 MG PO TABS
100.0000 mg | ORAL_TABLET | Freq: Every day | ORAL | Status: DC
  Administered 2018-09-17 – 2018-09-18 (×2): 100 mg via ORAL
  Filled 2018-09-16 (×2): qty 1

## 2018-09-16 MED ORDER — TRAMADOL HCL 50 MG PO TABS
50.0000 mg | ORAL_TABLET | Freq: Four times a day (QID) | ORAL | Status: DC | PRN
  Administered 2018-09-16: 50 mg via ORAL
  Filled 2018-09-16: qty 1

## 2018-09-16 MED ORDER — BUPIVACAINE HCL (PF) 0.5 % IJ SOLN
INTRAMUSCULAR | Status: DC | PRN
  Administered 2018-09-16: 3 mL

## 2018-09-16 MED ORDER — MIDAZOLAM HCL 5 MG/5ML IJ SOLN
INTRAMUSCULAR | Status: DC | PRN
  Administered 2018-09-16: 0.5 mg via INTRAVENOUS

## 2018-09-16 MED ORDER — PHENYLEPHRINE HCL 10 MG/ML IJ SOLN
INTRAMUSCULAR | Status: AC
  Filled 2018-09-16: qty 2

## 2018-09-16 MED ORDER — FENTANYL CITRATE (PF) 100 MCG/2ML IJ SOLN
INTRAMUSCULAR | Status: AC
  Filled 2018-09-16: qty 2

## 2018-09-16 MED ORDER — LISINOPRIL 20 MG PO TABS
40.0000 mg | ORAL_TABLET | Freq: Every day | ORAL | Status: DC
  Administered 2018-09-17 – 2018-09-18 (×2): 40 mg via ORAL
  Filled 2018-09-16 (×2): qty 2

## 2018-09-16 MED ORDER — METOCLOPRAMIDE HCL 10 MG PO TABS: 5.0000 mg | ORAL_TABLET | Freq: Three times a day (TID) | ORAL | Status: DC | PRN

## 2018-09-16 MED ORDER — GLYCOPYRROLATE 0.2 MG/ML IJ SOLN
INTRAMUSCULAR | Status: DC | PRN
  Administered 2018-09-16: 0.2 mg via INTRAVENOUS

## 2018-09-16 MED ORDER — MAGNESIUM HYDROXIDE 400 MG/5ML PO SUSP: 30.0000 mL | Freq: Every day | ORAL | Status: DC | PRN

## 2018-09-16 MED ORDER — METOCLOPRAMIDE HCL 5 MG/ML IJ SOLN: 5.0000 mg | Freq: Three times a day (TID) | INTRAMUSCULAR | Status: DC | PRN

## 2018-09-16 MED ORDER — LACTATED RINGERS IV SOLN
INTRAVENOUS | Status: DC
  Administered 2018-09-16 (×2): via INTRAVENOUS

## 2018-09-16 MED ORDER — ALPRAZOLAM 0.5 MG PO TABS
0.5000 mg | ORAL_TABLET | Freq: Every day | ORAL | Status: DC
  Administered 2018-09-16 – 2018-09-17 (×2): 0.5 mg via ORAL
  Filled 2018-09-16 (×2): qty 1

## 2018-09-16 MED ORDER — GABAPENTIN 300 MG PO CAPS
300.0000 mg | ORAL_CAPSULE | Freq: Three times a day (TID) | ORAL | Status: DC
  Administered 2018-09-16 – 2018-09-18 (×6): 300 mg via ORAL
  Filled 2018-09-16 (×6): qty 1

## 2018-09-16 MED ORDER — TRANEXAMIC ACID 1000 MG/10ML IV SOLN
INTRAVENOUS | Status: AC
  Filled 2018-09-16: qty 10

## 2018-09-16 MED ORDER — BUPIVACAINE LIPOSOME 1.3 % IJ SUSP
INTRAMUSCULAR | Status: AC
  Filled 2018-09-16: qty 20

## 2018-09-16 MED ORDER — PROPOFOL 500 MG/50ML IV EMUL
INTRAVENOUS | Status: DC | PRN
  Administered 2018-09-16: 45 ug/kg/min via INTRAVENOUS

## 2018-09-16 MED ORDER — SODIUM CHLORIDE 0.9 % IV SOLN
INTRAVENOUS | Status: DC | PRN
  Administered 2018-09-16: 40 ug/min via INTRAVENOUS

## 2018-09-16 MED ORDER — VITAMIN B-12 1000 MCG PO TABS
1000.0000 ug | ORAL_TABLET | Freq: Every day | ORAL | Status: DC
  Administered 2018-09-16 – 2018-09-18 (×3): 1000 ug via ORAL
  Filled 2018-09-16 (×3): qty 1

## 2018-09-16 MED ORDER — OMEGA-3-ACID ETHYL ESTERS 1 G PO CAPS
1000.0000 mg | ORAL_CAPSULE | Freq: Every day | ORAL | Status: DC
  Administered 2018-09-16 – 2018-09-18 (×3): 1000 mg via ORAL
  Filled 2018-09-16 (×3): qty 1

## 2018-09-16 MED ORDER — OXYCODONE HCL 5 MG PO TABS
5.0000 mg | ORAL_TABLET | ORAL | Status: DC | PRN
  Administered 2018-09-17: 10 mg via ORAL
  Administered 2018-09-17: 5 mg via ORAL
  Administered 2018-09-18: 10 mg via ORAL
  Filled 2018-09-16 (×4): qty 2

## 2018-09-16 MED ORDER — ENOXAPARIN SODIUM 40 MG/0.4ML ~~LOC~~ SOLN
40.0000 mg | SUBCUTANEOUS | Status: DC
  Administered 2018-09-17 – 2018-09-18 (×2): 40 mg via SUBCUTANEOUS
  Filled 2018-09-16 (×2): qty 0.4

## 2018-09-16 MED ORDER — KETOROLAC TROMETHAMINE 15 MG/ML IJ SOLN
INTRAMUSCULAR | Status: AC
  Filled 2018-09-16: qty 1

## 2018-09-16 MED ORDER — FENTANYL CITRATE (PF) 100 MCG/2ML IJ SOLN: 25.0000 ug | INTRAMUSCULAR | Status: DC | PRN

## 2018-09-16 MED ORDER — ACETAMINOPHEN 10 MG/ML IV SOLN
INTRAVENOUS | Status: AC
  Filled 2018-09-16: qty 100

## 2018-09-16 MED ORDER — FENTANYL CITRATE (PF) 100 MCG/2ML IJ SOLN
INTRAMUSCULAR | Status: DC | PRN
  Administered 2018-09-16: 50 ug via INTRAVENOUS

## 2018-09-16 MED ORDER — MIDAZOLAM HCL 2 MG/2ML IJ SOLN
INTRAMUSCULAR | Status: AC
  Filled 2018-09-16: qty 2

## 2018-09-16 SURGICAL SUPPLY — 59 items
BANDAGE ELASTIC 6 LF NS (GAUZE/BANDAGES/DRESSINGS) ×3 IMPLANT
BEARING TIBIAL KNEE AS 10M 79M (Knees) ×3 IMPLANT
BLADE SAW SAG 25X90X1.19 (BLADE) ×3 IMPLANT
BLADE SURG SZ20 CARB STEEL (BLADE) ×3 IMPLANT
CANISTER SUCT 1200ML W/VALVE (MISCELLANEOUS) ×3 IMPLANT
CANISTER SUCT 3000ML PPV (MISCELLANEOUS) ×3 IMPLANT
CEMENT BONE R 1X40 (Cement) ×6 IMPLANT
CEMENT VACUUM MIXING SYSTEM (MISCELLANEOUS) ×3 IMPLANT
CHLORAPREP W/TINT 26ML (MISCELLANEOUS) ×3 IMPLANT
COOLER POLAR GLACIER W/PUMP (MISCELLANEOUS) ×3 IMPLANT
COVER MAYO STAND STRL (DRAPES) ×3 IMPLANT
COVER WAND RF STERILE (DRAPES) IMPLANT
CUFF TOURN 24 STER (MISCELLANEOUS) IMPLANT
CUFF TOURN 30 STER DUAL PORT (MISCELLANEOUS) ×3 IMPLANT
DRAPE IMP U-DRAPE 54X76 (DRAPES) ×3 IMPLANT
DRAPE INCISE IOBAN 66X45 STRL (DRAPES) ×3 IMPLANT
DRAPE SHEET LG 3/4 BI-LAMINATE (DRAPES) ×3 IMPLANT
DRSG OPSITE POSTOP 4X10 (GAUZE/BANDAGES/DRESSINGS) ×3 IMPLANT
DRSG OPSITE POSTOP 4X8 (GAUZE/BANDAGES/DRESSINGS) ×3 IMPLANT
ELECT CAUTERY BLADE 6.4 (BLADE) ×3 IMPLANT
ELECT REM PT RETURN 9FT ADLT (ELECTROSURGICAL) ×3
ELECTRODE REM PT RTRN 9FT ADLT (ELECTROSURGICAL) ×1 IMPLANT
FEMORAL CR LEFT 75 (Joint) ×3 IMPLANT
GLOVE BIO SURGEON STRL SZ7.5 (GLOVE) ×12 IMPLANT
GLOVE BIO SURGEON STRL SZ8 (GLOVE) ×12 IMPLANT
GLOVE BIOGEL PI IND STRL 8 (GLOVE) ×1 IMPLANT
GLOVE BIOGEL PI INDICATOR 8 (GLOVE) ×2
GLOVE INDICATOR 8.0 STRL GRN (GLOVE) ×3 IMPLANT
GOWN STRL REUS W/ TWL LRG LVL3 (GOWN DISPOSABLE) ×1 IMPLANT
GOWN STRL REUS W/ TWL XL LVL3 (GOWN DISPOSABLE) ×1 IMPLANT
GOWN STRL REUS W/TWL LRG LVL3 (GOWN DISPOSABLE) ×2
GOWN STRL REUS W/TWL XL LVL3 (GOWN DISPOSABLE) ×2
HOLDER FOLEY CATH W/STRAP (MISCELLANEOUS) ×3 IMPLANT
HOOD PEEL AWAY FLYTE STAYCOOL (MISCELLANEOUS) ×9 IMPLANT
IMMBOLIZER KNEE 19 BLUE UNIV (SOFTGOODS) ×3 IMPLANT
KIT TURNOVER KIT A (KITS) ×3 IMPLANT
NDL SAFETY ECLIPSE 18X1.5 (NEEDLE) ×2 IMPLANT
NEEDLE HYPO 18GX1.5 SHARP (NEEDLE) ×4
NEEDLE SPNL 20GX3.5 QUINCKE YW (NEEDLE) ×3 IMPLANT
NS IRRIG 1000ML POUR BTL (IV SOLUTION) ×3 IMPLANT
PACK TOTAL KNEE (MISCELLANEOUS) ×3 IMPLANT
PAD WRAPON POLAR KNEE (MISCELLANEOUS) ×1 IMPLANT
PATELLA STD 34X8.5 (Orthopedic Implant) ×3 IMPLANT
PLATE KNEE TIBIAL 79MM FIXED (Plate) ×3 IMPLANT
PULSAVAC PLUS IRRIG FAN TIP (DISPOSABLE) ×3
SOL .9 NS 3000ML IRR  AL (IV SOLUTION) ×2
SOL .9 NS 3000ML IRR UROMATIC (IV SOLUTION) ×1 IMPLANT
STAPLER SKIN PROX 35W (STAPLE) ×3 IMPLANT
SUCTION FRAZIER HANDLE 10FR (MISCELLANEOUS) ×2
SUCTION TUBE FRAZIER 10FR DISP (MISCELLANEOUS) ×1 IMPLANT
SUT VIC AB 0 CT1 36 (SUTURE) ×9 IMPLANT
SUT VIC AB 2-0 CT1 27 (SUTURE) ×6
SUT VIC AB 2-0 CT1 TAPERPNT 27 (SUTURE) ×3 IMPLANT
SYR 10ML LL (SYRINGE) ×3 IMPLANT
SYR 20CC LL (SYRINGE) ×3 IMPLANT
SYR 30ML LL (SYRINGE) ×9 IMPLANT
TIP FAN IRRIG PULSAVAC PLUS (DISPOSABLE) ×1 IMPLANT
TRAY FOLEY MTR SLVR 16FR STAT (SET/KITS/TRAYS/PACK) ×3 IMPLANT
WRAPON POLAR PAD KNEE (MISCELLANEOUS) ×3

## 2018-09-16 NOTE — Anesthesia Post-op Follow-up Note (Signed)
Anesthesia QCDR form completed.        

## 2018-09-16 NOTE — Evaluation (Signed)
Physical Therapy Evaluation Patient Details Name: Tony Mitchell AGE MRN: [id number] DOB: 1941/01/01 Today's Date: 09/16/2018   History of Present Illness  78 y/o male s/p L TKA 09/16/18.  Clinical Impression  Pt did very well with POD0 PT exam.  He was able to easily do SLRs and get to >90 degrees before any warm up.  He did well with 15 minutes of exercises with solid quad set, SLRs and great quality of motion with all tasks.  He was able to ambulate well with RW and had no buckling or pain limitation with functional tasks.  Pt overall did very well and should be able to safely go home with assist from wife once medically cleared for d/c.      Follow Up Recommendations Home health PT    Equipment Recommendations  None recommended by PT    Recommendations for Other Services       Precautions / Restrictions Precautions Precautions: Fall;Knee Restrictions LLE Weight Bearing: Weight bearing as tolerated      Mobility  Bed Mobility Overal bed mobility: Independent             General bed mobility comments: Pt able to easily get to EOB  Transfers Overall transfer level: Independent Equipment used: Rolling walker (2 wheeled)             General transfer comment: Pt needed cuing for set up, sequencing, etc but did not need direct assist to get to standing with walker  Ambulation/Gait Ambulation/Gait assistance: Min guard Gait Distance (Feet): 35 Feet Assistive device: Rolling walker (2 wheeled)       General Gait Details: Pt was able to ambulate relatively well POD0 with some initial hesitation but even able to maintain forward walker motion by the end of the effort.   Stairs            Wheelchair Mobility    Modified Rankin (Stroke Patients Only)       Balance Overall balance assessment: Independent                                           Pertinent Vitals/Pain Pain Assessment: 0-10 Pain Score: 1  Pain Location: some minimal  increase with activity, but never pain limited    Home Living Family/patient expects to be discharged to:: Private residence Living Arrangements: Spouse/significant other Available Help at Discharge: Family Type of Home: House Home Access: Stairs to enter Entrance Stairs-Rails: Right;Left(too wide) Technical brewer of Steps: 3 Home Layout: Able to live on main level with bedroom/bathroom Home Equipment: Walker - 2 wheels;Bedside commode      Prior Function Level of Independence: Independent         Comments: Pt normally able to be active and independent, knees have been getting worse     Hand Dominance        Extremity/Trunk Assessment   Upper Extremity Assessment Upper Extremity Assessment: Overall WFL for tasks assessed    Lower Extremity Assessment Lower Extremity Assessment: Overall WFL for tasks assessed(expected post-op limitations but [facility])       Communication   Communication: No difficulties  Cognition Arousal/Alertness: Awake/alert Behavior During Therapy: WFL for tasks assessed/performed Overall Cognitive Status: Within Functional Limits for tasks assessed  General Comments      Exercises Total Joint Exercises Ankle Circles/Pumps: AROM;10 reps Quad Sets: Strengthening;10 reps Short Arc Quad: Strengthening;10 reps Heel Slides: Strengthening;10 reps Hip ABduction/ADduction: Strengthening;10 reps Straight Leg Raises: AROM;10 reps Knee Flexion: PROM;10 reps Goniometric ROM: 0-105 (AROM to 94 before warm up)   Assessment/Plan    PT Assessment Patient needs continued PT services  PT Problem List Decreased strength;Decreased range of motion;Decreased activity tolerance;Decreased balance;Decreased mobility;Decreased knowledge of use of DME;Decreased safety awareness;Pain       PT Treatment Interventions DME instruction;Gait training;Stair training;Functional mobility training;Therapeutic  activities;Therapeutic exercise;Balance training;Patient/family education;Neuromuscular re-education    PT Goals (Current goals can be found in the Care Plan section)  Acute Rehab PT Goals Patient Stated Goal: go home PT Goal Formulation: With patient Time For Goal Achievement: 09/30/18 Potential to Achieve Goals: Good    Frequency BID   Barriers to discharge        Co-evaluation               AM-PAC PT "6 Clicks" Mobility  Outcome Measure Help needed turning from your back to your side while in a flat bed without using bedrails?: None Help needed moving from lying on your back to sitting on the side of a flat bed without using bedrails?: None Help needed moving to and from a bed to a chair (including a wheelchair)?: None Help needed standing up from a chair using your arms (e.g., wheelchair or bedside chair)?: None Help needed to walk in hospital room?: None Help needed climbing 3-5 steps with a railing? : A Little 6 Click Score: 23    End of Session Equipment Utilized During Treatment: Gait belt Activity Tolerance: Patient tolerated treatment well Patient left: with chair alarm set;with call bell/phone within reach Nurse Communication: Mobility status PT Visit Diagnosis: Muscle weakness (generalized) (M62.81);Difficulty in walking, not elsewhere classified (R26.2);Pain Pain - Right/Left: Left Pain - part of body: Knee    Time: 2951-8841 PT Time Calculation (min) (ACUTE ONLY): 32 min   Charges:   PT Evaluation $PT Eval Low Complexity: 1 Low PT Treatments $Therapeutic Exercise: 8-22 mins        Kreg Shropshire, DPT 09/16/2018, 6:04 PM

## 2018-09-16 NOTE — H&P (Signed)
Paper H&P to be scanned into permanent record. H&P reviewed and patient re-examined. No changes. 

## 2018-09-16 NOTE — Anesthesia Procedure Notes (Signed)
Spinal  Patient location during procedure: OR Start time: 09/16/2018 7:39 AM End time: 09/16/2018 7:44 AM Staffing Resident/CRNA: Bernardo Heater, CRNA Performed: resident/CRNA  Preanesthetic Checklist Completed: patient identified, site marked, surgical consent, pre-op evaluation, timeout performed, IV checked, risks and benefits discussed and monitors and equipment checked Spinal Block Patient position: sitting Prep: ChloraPrep Patient monitoring: heart rate, continuous pulse ox, blood pressure and cardiac monitor Approach: midline Location: L2-3 Injection technique: single-shot Needle Needle type: Introducer and Pencan  Needle gauge: 24 G Needle length: 9 cm Additional Notes Negative paresthesia. Negative blood return. Positive free-flowing CSF. Expiration date of kit checked and confirmed. Patient tolerated procedure well, without complications.

## 2018-09-16 NOTE — Anesthesia Preprocedure Evaluation (Signed)
Anesthesia Evaluation  Patient identified by MRN, date of birth, ID band Patient awake    Reviewed: Allergy & Precautions, H&P , NPO status , Patient's Chart, lab work & pertinent test results  History of Anesthesia Complications (+) DIFFICULT AIRWAY and history of anesthetic complications  Airway Mallampati: III  TM Distance: <3 FB Neck ROM: limited    Dental  (+) Chipped, Poor Dentition, Missing   Pulmonary neg shortness of breath, sleep apnea and Continuous Positive Airway Pressure Ventilation , COPD, former smoker,           Cardiovascular Exercise Tolerance: Good hypertension, (-) angina+ CAD and + Peripheral Vascular Disease  (-) Past MI, (-) Cardiac Stents and (-) CABG (-) dysrhythmias + Valvular Problems/Murmurs AI and MR      Neuro/Psych negative neurological ROS  negative psych ROS   GI/Hepatic Neg liver ROS, GERD  Medicated and Controlled,  Endo/Other  negative endocrine ROS  Renal/GU      Musculoskeletal   Abdominal   Peds  Hematology negative hematology ROS (+)   Anesthesia Other Findings Past Medical History: No date: COPD (chronic obstructive pulmonary disease) (HCC) No date: Coronary artery disease No date: Gout No date: Hyperlipidemia No date: Hypertension No date: Sleep apnea  Past Surgical History: 2012: CATARACT EXTRACTION 1965: KNEE SURGERY; Left  BMI    Body Mass Index:  28.06 kg/m      Reproductive/Obstetrics negative OB ROS                             Anesthesia Physical  Anesthesia Plan  ASA: III  Anesthesia Plan: Spinal   Post-op Pain Management:    Induction:   PONV Risk Score and Plan: Treatment may vary due to age or medical condition, Propofol infusion and TIVA  Airway Management Planned: Natural Airway and Simple Face Mask  Additional Equipment:   Intra-op Plan:   Post-operative Plan:   Informed Consent: I have reviewed the  patients History and Physical, chart, labs and discussed the procedure including the risks, benefits and alternatives for the proposed anesthesia with the patient or authorized representative who has indicated his/her understanding and acceptance.     Dental Advisory Given  Plan Discussed with: Anesthesiologist, CRNA and Surgeon  Anesthesia Plan Comments: (Patient consented for risks of anesthesia including but not limited to:  - adverse reactions to medications - damage to teeth, lips or other oral mucosa - sore throat or hoarseness - Damage to heart, brain, lungs or loss of life  Patient voiced understanding.)        Anesthesia Quick Evaluation

## 2018-09-16 NOTE — Progress Notes (Signed)
15 minute call to floor. 

## 2018-09-16 NOTE — Progress Notes (Signed)
Pt admitted to unit from PACU. Pt is A&Ox4. Pt oriented to room and plan of care. Surgical dressing CDI, polar care in place. Pt currently has full sensation in BLE and can move both feet. Pt tolerated CPM 70-100 for 2 hours.

## 2018-09-16 NOTE — Progress Notes (Signed)
Ch visited w/ wife of pt while pt was in surgery. Wife shared how pt was struggling for a while w/ knee issues. Wife was on O2 with a mobile unit. Ch allowed wife to reflect on their life story and how the changes in their health have kept them separated from their other family members. Ch provided a compassionate presence and listened to the wife share about how she would appreciate help from a home health agency when the pt arrives. F/u recommended.     09/16/18 0800  Clinical Encounter Type  Visited With Family  Visit Type Initial;Psychological support;Spiritual support;Social support;Patient in surgery  Referral From Chaplain  Consult/Referral To Chaplain  Spiritual Encounters  Spiritual Needs Prayer;Emotional;Grief support  Stress Factors  Patient Stress Factors Health changes;Major life changes  Family Stress Factors Major life changes;Loss of control;Health changes;Family relationships

## 2018-09-16 NOTE — Care Management Note (Signed)
Case Management Note  Patient Details  Name: Tony Mitchell MRN: [id number] Date of Birth: [birth year]  Subjective/Objective:                  Met with patient to discuss DC plan and needs Patient has a RW at home Patient has a 3 in 1 at home Has 3 steps to get into home and has first floor with bathroom and bedroom Has been using CPM machine Patient uses Dr. Alvy Bimler as a PCP Patient uses [facility] in [place] Can afford medications Patient familiar with lovenox and has done them before Will provide [facility] list once PT evaluation is comnpleted    Action/Plan:  Continue to monitor for needs   Met with patient to discuss DC plan and needs Patient has a RW at home Patient has a 3 in 1 at home Has 3 steps to get into home and has first floor with bathroom and bedroom Has been using CPM machine Patient uses Dr. Alvy Bimler as a PCP Patient uses [facility] in [place] Can afford medications Patient familiar with lovenox and has done them before Will provide [facility] list once PT evaluation is comnpleted  Expected Discharge Date:  09/18/18               Expected Discharge Plan:     In-House Referral:     Discharge planning Services     Post Acute Care Choice:    Choice offered to:     DME Arranged:    DME Agency:     HH Arranged:  PT HH Agency:     Status of Service:  In process, will continue to follow  If discussed at Long Length of Stay Meetings, dates discussed:    Additional Comments:  Su Hilt, RN 09/16/2018, 3:28 PM

## 2018-09-16 NOTE — Op Note (Signed)
09/16/2018  10:09 AM  Patient:   Tony Mitchell  Pre-Op Diagnosis:   Degenerative joint disease, left knee.  Post-Op Diagnosis:   Same  Procedure:   Left TKA using all-cemented [employer] Vanguard system with a 75 mm PCR femur, a 79 mm tibial tray with a 10 mm tear anterior stabilized e-poly insert, and a 34 x 8.5 mm all-poly 3-pegged domed patella.  Surgeon:   Pascal Lux, MD  Assistant:   Cameron Proud, PA-C   Anesthesia:   Spinal  Findings:   As above  Complications:   None  EBL:   10 cc  Fluids:   1500 cc crystalloid  UOP:   100 cc  TT:   90 minutes at 300 mmHg  Drains:   None  Closure:   Staples  Implants:   As above  Brief Clinical Note:   The patient is a 78 year old male with a long history of progressively worsening left knee pain. The patient's symptoms have progressed despite medications, activity modification, injections, etc. The patient's history and examination were consistent with advanced degenerative joint disease of the right knee confirmed by plain radiographs. The patient presents at this time for a left total knee arthroplasty.  Procedure:   The patient was brought into the operating room. After adequate spinal anesthesia was obtained, the patient was lain in the supine position. A Foley catheter was placed by the nurse before the right lower extremity was prepped with ChloraPrep solution and draped sterilely. Preoperative antibiotics were administered. After verifying the proper laterality with a surgical timeout, the limb was exsanguinated with an Esmarch and the tourniquet inflated to 300 mmHg. A standard anterior approach to the knee was made through an approximately 7 inch incision. The incision was carried down through the subcutaneous tissues to expose superficial retinaculum. This was split the length of the incision and the medial flap elevated sufficiently to expose the medial retinaculum. The medial retinaculum was incised, leaving a 3-4 mm  cuff of tissue on the patella. This was extended distally along the medial border of the patellar tendon and proximally through the medial third of the quadriceps tendon. A subtotal fat pad excision was performed before the soft tissues were elevated off the anteromedial and anterolateral aspects of the proximal tibia to the level of the collateral ligaments. The anterior portions of the medial and lateral menisci were removed, as was the anterior cruciate ligament. With the knee flexed to 90, the external tibial guide was positioned and the appropriate proximal tibial cut made. This piece was taken to the back table where it was measured and found to be optimally replicated by a 79 mm component.  Attention was directed to the distal femur. The intramedullary canal was accessed through a 3/8" drill hole. The intramedullary guide was inserted and positioned in order to obtain a neutral flexion gap. The intercondylar block was positioned with care taken to avoid notching the anterior cortex of the femur. The appropriate cut was made. Next, the distal cutting block was placed at 6 of valgus alignment. Using the 9 mm slot, the distal cut was made. The distal femur was measured and found to be optimally replicated by the 75 mm component. The 75 mm 4-in-1 cutting block was positioned and first the posterior, then the posterior chamfer, the anterior chamfer, and finally the anterior cuts were made. At this point, the posterior portions medial and lateral menisci were removed. A trial reduction was performed using the appropriate femoral and tibial components  with the 10 mm insert. This demonstrated excellent stability to varus and valgus stressing both in flexion and extension while permitting full extension. Patella tracking was assessed and found to be excellent. Therefore, the tibial guide position was marked on the proximal tibia. The patella thickness was measured and found to be 22 mm. Therefore, the appropriate  cut was made. The patellar surface was measured and found to be optimally replicated by the 34 mm component. The three peg holes were drilled in place before the trial button was inserted. Patella tracking was assessed and found to be excellent, passing the "no thumb test". The lug holes were drilled into the distal femur before the trial component was removed, leaving only the tibial tray. The keel was then created using the appropriate tower, reamer, and punch.  The bony surfaces were prepared for cementing by irrigating them thoroughly with bacitracin saline solution via the jet lavage system. A bone plug was fashioned from some of the bone that had been removed previously and used to plug the distal femoral canal. In addition, 20 cc of Exparel diluted out to 60 cc with normal saline and 30 cc of 0.5% Sensorcaine were injected into the postero-medial and postero-lateral aspects of the knee, the medial and lateral gutter regions, and the peri-incisional tissues to help with postoperative analgesia. Meanwhile, the cement was being mixed on the back table. When it was ready, the tibial tray was cemented in first. The excess cement was removed using [employer] fast streamer. Next, the femoral component was impacted into place. Again, the excess cement was removed using [employer] fast streamer. The 10 mm trial insert was positioned and the knee brought into extension while the cement hardened. Finally, the patella was cemented into place and secured using the patellar clamp. Again, the excess cement was removed using [employer] fast streamer. Once the cement had hardened, the knee was placed through a range of motion with the findings as described above. Therefore, the trial insert was removed and, after verifying that no cement had been retained posteriorly, the permanent 10 mm anterior stabilized E-polyethylene insert was positioned and secured using the appropriate key locking mechanism. Again the knee was placed through a range of motion  with the findings as described above.  The wound was copiously irrigated with bacitracin saline solution using the jet lavage system before the quadriceps tendon and retinacular layer were reapproximated using #0 Vicryl interrupted sutures. The superficial retinacular layer also was closed using a running #0 Vicryl suture. A total of 10 cc of transexemic acid (TXA) was injected intra-articularly before the subcutaneous tissues were closed in several layers using 2-0 Vicryl interrupted sutures. The skin was closed using staples. A sterile honeycomb dressing was applied to the skin before the leg was wrapped with an Ace wrap to accommodate the Polar Care device. The patient was then awakened and returned to the recovery room in satisfactory condition after tolerating the procedure well.

## 2018-09-16 NOTE — Transfer of Care (Signed)
Immediate Anesthesia Transfer of Care Note  Patient: Tony Mitchell  Procedure(s) Performed: TOTAL KNEE ARTHROPLASTY (Left Knee)  Patient Location: PACU  Anesthesia Type:Spinal  Level of Consciousness: awake, alert , oriented and patient cooperative  Airway & Oxygen Therapy: Patient Spontanous Breathing and Patient connected to nasal cannula oxygen  Post-op Assessment: Report given to RN and Post -op Vital signs reviewed and stable  Post vital signs: Reviewed and stable  Last Vitals:  Vitals Value Taken Time  BP 97/67 09/16/2018  9:59 AM  Temp    Pulse 70 09/16/2018 10:01 AM  Resp 25 09/16/2018 10:01 AM  SpO2 98 % 09/16/2018 10:01 AM  Vitals shown include unvalidated device data.  Last Pain:  Vitals:   09/16/18 0618  TempSrc: Tympanic  PainSc: 0-No pain         Complications: No apparent anesthesia complications

## 2018-09-17 ENCOUNTER — Encounter: Payer: Self-pay | Admitting: Surgery

## 2018-09-17 LAB — CBC WITH DIFFERENTIAL/PLATELET
ABS IMMATURE GRANULOCYTES: 0.02 10*3/uL (ref 0.00–0.07)
Basophils Absolute: 0 10*3/uL (ref 0.0–0.1)
Basophils Relative: 0 %
Eosinophils Absolute: 0.1 10*3/uL (ref 0.0–0.5)
Eosinophils Relative: 2 %
HCT: 33.2 % — ABNORMAL LOW (ref 39.0–52.0)
Hemoglobin: 10.8 g/dL — ABNORMAL LOW (ref 13.0–17.0)
Immature Granulocytes: 0 %
Lymphocytes Relative: 19 %
Lymphs Abs: 1.4 10*3/uL (ref 0.7–4.0)
MCH: 30.9 pg (ref 26.0–34.0)
MCHC: 32.5 g/dL (ref 30.0–36.0)
MCV: 95.1 fL (ref 80.0–100.0)
MONO ABS: 0.6 10*3/uL (ref 0.1–1.0)
Monocytes Relative: 8 %
Neutro Abs: 5.2 10*3/uL (ref 1.7–7.7)
Neutrophils Relative %: 71 %
Platelets: 198 10*3/uL (ref 150–400)
RBC: 3.49 MIL/uL — ABNORMAL LOW (ref 4.22–5.81)
RDW: 15 % (ref 11.5–15.5)
WBC: 7.4 10*3/uL (ref 4.0–10.5)
nRBC: 0 % (ref 0.0–0.2)

## 2018-09-17 LAB — BASIC METABOLIC PANEL
Anion gap: 9 (ref 5–15)
BUN: 21 mg/dL (ref 8–23)
CO2: 22 mmol/L (ref 22–32)
Calcium: 8.1 mg/dL — ABNORMAL LOW (ref 8.9–10.3)
Chloride: 105 mmol/L (ref 98–111)
Creatinine, Ser: 1.09 mg/dL (ref 0.61–1.24)
GFR calc Af Amer: >60 mL/min (ref >60)
GFR calc non Af Amer: >60 mL/min (ref >60)
Glucose, Bld: 130 mg/dL — ABNORMAL HIGH (ref 70–99)
POTASSIUM: 4.1 mmol/L (ref 3.5–5.1)
Sodium: 136 mmol/L (ref 135–145)

## 2018-09-17 MED ORDER — HYDRALAZINE HCL 50 MG PO TABS
50.0000 mg | ORAL_TABLET | ORAL | Status: AC
  Administered 2018-09-17: 50 mg via ORAL

## 2018-09-17 NOTE — Progress Notes (Signed)
BP was elevated, pt states pain in left knee. Given medication for pain relief, will recheck BP.  Bethann Punches, RN

## 2018-09-17 NOTE — Progress Notes (Signed)
Physical Therapy Treatment Patient Details Name: Tony Mitchell MRN: [id number] DOB: 11/07/1940 Today's Date: 09/17/2018    History of Present Illness Pt is a 78 year old male s/p L TKA.  PMH includes COPD, Htn and CAD.    PT Comments    Pt was able to progress walking distance to 200 ft with use of RW this morning.  He demonstrated good use of RW with min VC's and was able to navigate obstacles and restroom without difficulty.  Pt is able to perform static standing balance without use of RW.  He demonstrated good form and understanding of there ex and completed sets of 15 of each.  Pt needed no assistance for bed mobility and min VC's only for STS with RW.  Pt reported slightly elevated pain level compared to yesterday but was still able to demonstrate progress with knee flexion AROM.  He will continue to benefit from skilled PT with focus on strength, safe functional mobility and pain management.   Follow Up Recommendations  Home health PT     Equipment Recommendations  None recommended by PT    Recommendations for Other Services       Precautions / Restrictions Precautions Precautions: Fall;Knee Precaution Booklet Issued: Yes (comment) Restrictions LLE Weight Bearing: Weight bearing as tolerated    Mobility  Bed Mobility Overal bed mobility: Independent             General bed mobility comments: Pt able to easily get to EOB  Transfers Overall transfer level: Independent Equipment used: Rolling walker (2 wheeled)             General transfer comment: Pt needed cuing for set up, sequencing, etc but did not need physical assistance to get to standing with walker  Ambulation/Gait Ambulation/Gait assistance: Min guard Gait Distance (Feet): 200 Feet Assistive device: Rolling walker (2 wheeled)     Gait velocity interpretation: 1.31 - 2.62 ft/sec, indicative of limited community ambulator General Gait Details: Pt was able to ambulate with step through gait  pattern and min VC's for use of RW.  Pt demonstrated mild unsteadiness on feet at times but was able to manage on his own and navigate obstacles in room and restroom with min use of RW.   Stairs             Wheelchair Mobility    Modified Rankin (Stroke Patients Only)       Balance Overall balance assessment: Independent                                          Cognition Arousal/Alertness: Awake/alert Behavior During Therapy: WFL for tasks assessed/performed Overall Cognitive Status: Within Functional Limits for tasks assessed                                        Exercises Total Joint Exercises Ankle Circles/Pumps: AROM;15 reps;Left;Supine Quad Sets: Strengthening;15 reps;Left;Supine Hip ABduction/ADduction: Strengthening;15 reps;Left;Supine Straight Leg Raises: 10 reps;Supine;Left;Strengthening Knee Flexion: PROM;10 reps Goniometric ROM: L knee ext/flex: 0-110    General Comments        Pertinent Vitals/Pain Pain Score: 3  Pain Location: L knee    Home Living  Prior Function            PT Goals (current goals can now be found in the care plan section) Acute Rehab PT Goals Patient Stated Goal: go home PT Goal Formulation: With patient Time For Goal Achievement: 09/30/18 Potential to Achieve Goals: Good Progress towards PT goals: Progressing toward goals    Frequency    BID      PT Plan Current plan remains appropriate    Co-evaluation              AM-PAC PT "6 Clicks" Mobility   Outcome Measure  Help needed turning from your back to your side while in a flat bed without using bedrails?: None Help needed moving from lying on your back to sitting on the side of a flat bed without using bedrails?: None Help needed moving to and from a bed to a chair (including a wheelchair)?: None Help needed standing up from a chair using your arms (e.g., wheelchair or bedside chair)?:  None Help needed to walk in hospital room?: None Help needed climbing 3-5 steps with a railing? : A Little 6 Click Score: 23    End of Session Equipment Utilized During Treatment: Gait belt Activity Tolerance: Patient tolerated treatment well Patient left: with chair alarm set;with call bell/phone within reach;in chair   PT Visit Diagnosis: Muscle weakness (generalized) (M62.81);Difficulty in walking, not elsewhere classified (R26.2);Pain Pain - Right/Left: Left Pain - part of body: Knee     Time: 6962-9528 PT Time Calculation (min) (ACUTE ONLY): 31 min  Charges:  $Therapeutic Exercise: 8-22 mins $Therapeutic Activity: 8-22 mins                     Roxanne Gates, PT, DPT    Roxanne Gates 09/17/2018, 9:31 AM

## 2018-09-17 NOTE — Anesthesia Postprocedure Evaluation (Signed)
Anesthesia Post Note  Patient: Tony Mitchell  Procedure(s) Performed: TOTAL KNEE ARTHROPLASTY (Left Knee)  Patient location during evaluation: Nursing Unit Anesthesia Type: Spinal Level of consciousness: awake, awake and alert and oriented Pain management: pain level controlled Vital Signs Assessment: post-procedure vital signs reviewed and stable Respiratory status: spontaneous breathing, nonlabored ventilation and respiratory function stable Cardiovascular status: blood pressure returned to baseline and stable Postop Assessment: no headache and no backache Anesthetic complications: no     Last Vitals:  Vitals:   09/17/18 0011 09/17/18 0416  BP: 124/60 138/80  Pulse: 74 78  Resp: 16 16  Temp: 37.1 C 37.1 C  SpO2: 95% 96%    Last Pain:  Vitals:   09/17/18 0416  TempSrc: Oral  PainSc:                  Johnna Acosta

## 2018-09-17 NOTE — Progress Notes (Signed)
Clinical Social Worker (CSW) received SNF consult. PT is recommending home health. RN case manager aware of above. Please reconsult if future social work needs arise. CSW signing off.   Rondell Frick, LCSW [phone] 

## 2018-09-17 NOTE — Progress Notes (Signed)
New orders received to starts NS @ 75.   Bethann Punches, RN

## 2018-09-17 NOTE — Care Management Note (Signed)
Case Management Note  Patient Details  Name: [name] MRN: [id number] Date of Birth: 1940-11-08  Subjective/Objective:                  Provided the patient with the [facility] list per [email or web address] Patient has chosen Alvis Lemmings as a Hotel manager at [place] that the patient has chosen Meredeth Ide confirms that they are INN Patient has no DME needs  Action/Plan:  [facility] list provided per CMS.gov Notified Alvis Lemmings of choice  Expected Discharge Date:  09/18/18               Expected Discharge Plan:     In-House Referral:     Discharge planning Services  CM Consult  Post Acute Care Choice:    Choice offered to:     DME Arranged:    DME Agency:     HH Arranged:  PT HH Agency:     Status of Service:  In process, will continue to follow  If discussed at Long Length of Stay Meetings, dates discussed:    Additional Comments:  Su Hilt, RN 09/17/2018, 2:45 PM

## 2018-09-17 NOTE — Progress Notes (Signed)
BP rechecked and still elevated. Paged Dr Roland Rack.  Bethann Punches, RN

## 2018-09-17 NOTE — Progress Notes (Signed)
  Subjective: 1 Day Post-Op Procedure(s) (LRB): TOTAL KNEE ARTHROPLASTY (Left) Patient reports pain as mild.   Patient is well, and has had no acute complaints or problems PT and care management to assist with discharge planning, current plan is d/c home with HHPT. Negative for chest pain and shortness of breath Fever: no Gastrointestinal:Negative for nausea and vomiting  Objective: Vital signs in last 24 hours: Temp:  [96 F (35.6 C)-98.8 F (37.1 C)] 98.8 F (37.1 C) (01/29 0416) Pulse Rate:  [53-78] 78 (01/29 0416) Resp:  [10-18] 16 (01/29 0416) BP: (97-163)/(60-85) 138/80 (01/29 0416) SpO2:  [95 %-100 %] 96 % (01/29 0416)  Intake/Output from previous day:  Intake/Output Summary (Last 24 hours) at 09/17/2018 0753 Last data filed at 09/17/2018 0500 Gross per 24 hour  Intake 2162.18 ml  Output 560 ml  Net 1602.18 ml    Intake/Output this shift: No intake/output data recorded.  Labs: Recent Labs    09/17/18 0411  HGB 10.8*   Recent Labs    09/17/18 0411  WBC 7.4  RBC 3.49*  HCT 33.2*  PLT 198   Recent Labs    09/17/18 0411  NA 136  K 4.1  CL 105  CO2 22  BUN 21  CREATININE 1.09  GLUCOSE 130*  CALCIUM 8.1*   No results for input(s): LABPT, INR in the last 72 hours.   EXAM General - Patient is Alert, Appropriate and Oriented Extremity - Neurovascular intact Sensation intact distally Intact pulses distally Dorsiflexion/Plantar flexion intact Incision: scant drainage No cellulitis present Dressing/Incision - blood tinged drainage Motor Function - intact, moving foot and toes well on exam.  Abdomen is distended but soft with normal BS.  Past Medical History:  Diagnosis Date  . Acute appendicitis 03/03/2018  . Arthritis   . COPD (chronic obstructive pulmonary disease) ([place])   . Coronary artery disease   . Difficult intubation   . GERD (gastroesophageal reflux disease)   . Gout   . Hyperlipidemia   . Hypertension   . Sleep apnea      Assessment/Plan: 1 Day Post-Op Procedure(s) (LRB): TOTAL KNEE ARTHROPLASTY (Left) Active Problems:   Status post total knee replacement using cement, left  Estimated body mass index is 28.94 kg/m as calculated from the following:   Height as of this encounter: 5\' 9"  (1.753 m).   Weight as of this encounter: 88.9 kg. Advance diet Up with therapy D/C IV fluids when tolerating po intake.  Labs reviewed this AM, Hg 10.8 this AM.  CBC and BMP ordered for tomorrow morning. Pt is passing gas without pain this AM.  Begin working on BM. Continue with PT. Plan for possible d/c home tomorrow pending progress with PT.  DVT Prophylaxis - Lovenox, Foot Pumps and TED hose Weight-Bearing as tolerated to left leg  J. Cameron Proud, PA-C [facility] Orthopaedic Surgery 09/17/2018, 7:53 AM

## 2018-09-17 NOTE — Progress Notes (Signed)
Physical Therapy Treatment Patient Details Name: Tony Mitchell MRN: [id number] DOB: [birth year] Today's Date: 09/17/2018    History of Present Illness Pt is a 78 year old male s/p L TKA.  PMH includes COPD, Htn and CAD.    PT Comments    Pt able to demonstrate progress by navigating 4 steps safely this afternoon with CGA and demonstrating improved gait pattern with 200 ft of ambulation and use of RW.  He did require min VC's for management of RW and was able to correct quickly.    Pt able to stand with wt shift to R side but pt able to self correct.  He demonstrated understanding of there ex and was able to complete sets of each exercise.  PT reported 3/10 pain with WB and activity.  Pt still demonstrating good knee ext/flex of L knee.  He will continue to benefit from skilled PT with focus on strength, safe functional mobility, HEP and pain management.  Follow Up Recommendations  Home health PT     Equipment Recommendations  None recommended by PT    Recommendations for Other Services       Precautions / Restrictions Precautions Precautions: Fall;Knee Precaution Booklet Issued: Yes (comment) Restrictions LLE Weight Bearing: Weight bearing as tolerated    Mobility  Bed Mobility Overal bed mobility: Independent             General bed mobility comments: No difficulty with returning to bed  Transfers Overall transfer level: Independent Equipment used: Rolling walker (2 wheeled)             General transfer comment: Able to stand with moderate wt shift to R side which pt corrected to L.  Ambulation/Gait Ambulation/Gait assistance: Min guard Gait Distance (Feet): 200 Feet Assistive device: Rolling walker (2 wheeled)     Gait velocity interpretation: >2.62 ft/sec, indicative of community ambulatory General Gait Details: Step through gait pattern with better speed this afternoon. Some VC's for use of RW.   Stairs Stairs: Yes Stairs assistance: Min  guard Stair Management: Step to pattern;Two rails Number of Stairs: 4 General stair comments: VC's to keep foot close to step and for management of lead LE.  Pt able to navigate stairs safely with only VC's.   Wheelchair Mobility    Modified Rankin (Stroke Patients Only)       Balance Overall balance assessment: Independent                                          Cognition Arousal/Alertness: Awake/alert Behavior During Therapy: WFL for tasks assessed/performed Overall Cognitive Status: Within Functional Limits for tasks assessed                                        Exercises Total Joint Exercises Ankle Circles/Pumps: AROM;Supine;10 reps;Both Quad Sets: Strengthening;Left;Supine;10 reps Heel Slides: Strengthening;Left;Supine;10 reps Hip ABduction/ADduction: Strengthening;Left;Supine;10 reps    General Comments        Pertinent Vitals/Pain Pain Assessment: 0-10 Pain Score: 3  Pain Location: L knee Pain Intervention(s): Monitored during session    Home Living                      Prior Function            PT Goals (current  goals can now be found in the care plan section) Acute Rehab PT Goals Patient Stated Goal: go home PT Goal Formulation: With patient Time For Goal Achievement: 09/30/18 Potential to Achieve Goals: Good Progress towards PT goals: Progressing toward goals    Frequency    BID      PT Plan Current plan remains appropriate    Co-evaluation              AM-PAC PT "6 Clicks" Mobility   Outcome Measure  Help needed turning from your back to your side while in a flat bed without using bedrails?: None Help needed moving from lying on your back to sitting on the side of a flat bed without using bedrails?: None Help needed moving to and from a bed to a chair (including a wheelchair)?: None Help needed standing up from a chair using your arms (e.g., wheelchair or bedside chair)?: None Help  needed to walk in hospital room?: None Help needed climbing 3-5 steps with a railing? : A Little 6 Click Score: 23    End of Session Equipment Utilized During Treatment: Gait belt Activity Tolerance: Patient tolerated treatment well Patient left: in bed;with bed alarm set Nurse Communication: Mobility status PT Visit Diagnosis: Muscle weakness (generalized) (M62.81);Difficulty in walking, not elsewhere classified (R26.2);Pain Pain - Right/Left: Left Pain - part of body: Knee     Time: 6333-5456 PT Time Calculation (min) (ACUTE ONLY): 28 min  Charges:  $Gait Training: 8-22 mins $Therapeutic Exercise: 8-22 mins                     Roxanne Gates, PT, DPT    Roxanne Gates 09/17/2018, 2:29 PM

## 2018-09-17 NOTE — Progress Notes (Signed)
Orders received from PA to give stat dose of 50mg  hydralazine. Will administer and recheck BP. Pt has no c/o of chest pain or palpitations. Face is flushed.  Bethann Punches, RN

## 2018-09-17 NOTE — NC FL2 (Signed)
[place] LEVEL OF CARE SCREENING TOOL     IDENTIFICATION  Patient Name: Tony Mitchell Birthdate: [birth year] Sex: male Admission Date (Current Location): 09/16/2018  [place] and [place] Number:  Engineering geologist and Address:  [facility], [address], [place], [place] [address]      Provider Number: [id number]  Attending Physician Name and Address:  Corky Mull, MD  Relative Name and Phone Number:       Current Level of Care: Hospital Recommended Level of Care: [place] Prior Approval Number:    Date Approved/Denied:   PASRR Number: [id number])  Discharge Plan: SNF    Current Diagnoses: Patient Active Problem List   Diagnosis Date Noted  . Status post total knee replacement using cement, left 09/16/2018  . History of pneumothorax 11/07/2017  . Pneumothorax 09/18/2017  . Thoracic aortic aneurysm without rupture ([place]) 08/16/2017  . Primary osteoarthritis of right knee 07/10/2017  . Spinal stenosis of lumbar region with radiculopathy 07/10/2017  . GERD (gastroesophageal reflux disease) 06/13/2016  . Gout 06/13/2016  . Hyperlipidemia, unspecified 06/13/2016  . Hypertension 06/13/2016  . Pneumothorax on right 06/03/2016  . Elevated PSA 03/29/2016  . Elevated blood sugar 09/29/2015  . Rash 02/17/2015  . OSA (obstructive sleep apnea) 07/08/2014  . S/P cardiac catheterization 05/28/2014  . SOB (shortness of breath) on exertion 05/28/2014    Orientation RESPIRATION BLADDER Height & Weight     Self, Time, Situation, Place  Normal Continent Weight: 195 lb 15.8 oz (88.9 kg) Height:  5\' 9"  (175.3 cm)  BEHAVIORAL SYMPTOMS/MOOD NEUROLOGICAL BOWEL NUTRITION STATUS      Continent Diet(Diet: Heart Healthy/ Carb Modified. )  AMBULATORY STATUS COMMUNICATION OF NEEDS Skin   Extensive Assist Verbally Surgical wounds(Incision: Left Knee. )                       Personal Care Assistance Level of  Assistance  Bathing, Feeding, Dressing Bathing Assistance: Limited assistance Feeding assistance: Independent Dressing Assistance: Limited assistance     Functional Limitations Info  Sight, Hearing, Speech Sight Info: Adequate Hearing Info: Adequate Speech Info: Adequate    SPECIAL CARE FACTORS FREQUENCY  PT (By licensed PT), OT (By licensed OT)     PT Frequency: (5) OT Frequency: (5)            Contractures      Additional Factors Info  Code Status, Allergies Code Status Info: (Full Code. ) Allergies Info: (No Known Allergies. )           Current Medications (09/17/2018):  This is the current hospital active medication list Current Facility-Administered Medications  Medication Dose Route Frequency Provider Last Rate Last Dose  . 0.9 %  sodium chloride infusion   Intravenous Continuous Poggi, Marshall Cork, MD 75 mL/hr at 09/16/18 1410    . acetaminophen (TYLENOL) tablet 1,000 mg  1,000 mg Oral Q6H Poggi, Marshall Cork, MD   1,000 mg at 09/17/18 [phone]  . acetaminophen (TYLENOL) tablet 325-650 mg  325-650 mg Oral Q6H PRN Poggi, Marshall Cork, MD      . albuterol (PROVENTIL) (2.5 MG/3ML) 0.083% nebulizer solution 2.5 mg  2.5 mg Nebulization Q6H PRN Poggi, Marshall Cork, MD      . allopurinol (ZYLOPRIM) tablet 100 mg  100 mg Oral Daily Poggi, Marshall Cork, MD   100 mg at 09/17/18 8416  . ALPRAZolam Duanne Moron) tablet 0.5 mg  0.5 mg Oral QHS Poggi, Marshall Cork, MD  0.5 mg at 09/16/18 2146  . aspirin EC tablet 81 mg  81 mg Oral Daily Corky Mull, MD   81 mg at 09/17/18 9983  . bisacodyl (DULCOLAX) suppository 10 mg  10 mg Rectal Daily PRN Poggi, Marshall Cork, MD      . diphenhydrAMINE (BENADRYL) 12.5 MG/5ML elixir 12.5-25 mg  12.5-25 mg Oral Q4H PRN Poggi, Marshall Cork, MD      . docusate sodium (COLACE) capsule 100 mg  100 mg Oral BID Corky Mull, MD   100 mg at 09/17/18 3825  . enoxaparin (LOVENOX) injection 40 mg  40 mg Subcutaneous Q24H Poggi, Marshall Cork, MD   40 mg at 09/17/18 0929  . gabapentin (NEURONTIN) capsule 300 mg   300 mg Oral TID Corky Mull, MD   300 mg at 09/17/18 0539  . hydrALAZINE (APRESOLINE) tablet 25 mg  25 mg Oral BID Corky Mull, MD   25 mg at 09/17/18 7673  . HYDROmorphone (DILAUDID) injection 0.25-0.5 mg  0.25-0.5 mg Intravenous Q2H PRN Poggi, Marshall Cork, MD      . ketorolac (TORADOL) 15 MG/ML injection 7.5 mg  7.5 mg Intravenous Q6H Poggi, Marshall Cork, MD   7.5 mg at 09/17/18 4193  . lisinopril (PRINIVIL,ZESTRIL) tablet 40 mg  40 mg Oral Daily Poggi, Marshall Cork, MD   40 mg at 09/17/18 7902  . magnesium hydroxide (MILK OF MAGNESIA) suspension 30 mL  30 mL Oral Daily PRN Poggi, Marshall Cork, MD      . metoCLOPramide (REGLAN) tablet 5-10 mg  5-10 mg Oral Q8H PRN Poggi, Marshall Cork, MD       Or  . metoCLOPramide (REGLAN) injection 5-10 mg  5-10 mg Intravenous Q8H PRN Poggi, Marshall Cork, MD      . mometasone-formoterol (DULERA) 100-5 MCG/ACT inhaler 2 puff  2 puff Inhalation BID Poggi, Marshall Cork, MD   2 puff at 09/17/18 [phone]  . omega-3 acid ethyl esters (LOVAZA) capsule 1,000 mg  1,000 mg Oral Daily Poggi, Marshall Cork, MD   1,000 mg at 09/17/18 0929  . ondansetron (ZOFRAN) tablet 4 mg  4 mg Oral Q6H PRN Poggi, Marshall Cork, MD       Or  . ondansetron (ZOFRAN) injection 4 mg  4 mg Intravenous Q6H PRN Poggi, Marshall Cork, MD      . oxyCODONE (Oxy IR/ROXICODONE) immediate release tablet 5-10 mg  5-10 mg Oral Q4H PRN Poggi, Marshall Cork, MD      . pantoprazole (PROTONIX) EC tablet 20 mg  20 mg Oral Daily Poggi, Marshall Cork, MD   20 mg at 09/17/18 3532  . simvastatin (ZOCOR) tablet 20 mg  20 mg Oral QHS Poggi, Marshall Cork, MD   20 mg at 09/16/18 2146  . sodium phosphate (FLEET) 7-19 GM/118ML enema 1 enema  1 enema Rectal Once PRN Poggi, Marshall Cork, MD      . traMADol Veatrice Bourbon) tablet 50 mg  50 mg Oral Q6H PRN Poggi, Marshall Cork, MD   50 mg at 09/16/18 1435  . vitamin B-12 (CYANOCOBALAMIN) tablet 1,000 mcg  1,000 mcg Oral Daily Poggi, Marshall Cork, MD   1,000 mcg at 09/17/18 9924     Discharge Medications: Please see discharge summary for a list of discharge  medications.  Relevant Imaging Results:  Relevant Lab Results:   Additional Information (SSN: [id number])  Lenna Hagarty, Veronia Beets, LCSW

## 2018-09-17 NOTE — Care Management (Signed)
Tony Mitchell has accepted the patient per Tommi Rumps at [place]

## 2018-09-18 LAB — CBC
HEMATOCRIT: 33.1 % — AB (ref 39.0–52.0)
Hemoglobin: 11 g/dL — ABNORMAL LOW (ref 13.0–17.0)
MCH: 31.7 pg (ref 26.0–34.0)
MCHC: 33.2 g/dL (ref 30.0–36.0)
MCV: 95.4 fL (ref 80.0–100.0)
NRBC: 0 % (ref 0.0–0.2)
Platelets: 183 10*3/uL (ref 150–400)
RBC: 3.47 MIL/uL — ABNORMAL LOW (ref 4.22–5.81)
RDW: 14.5 % (ref 11.5–15.5)
WBC: 8 10*3/uL (ref 4.0–10.5)

## 2018-09-18 LAB — BASIC METABOLIC PANEL
Anion gap: 8 (ref 5–15)
BUN: 14 mg/dL (ref 8–23)
CO2: 23 mmol/L (ref 22–32)
Calcium: 8.5 mg/dL — ABNORMAL LOW (ref 8.9–10.3)
Chloride: 106 mmol/L (ref 98–111)
Creatinine, Ser: 0.89 mg/dL (ref 0.61–1.24)
GFR calc Af Amer: >60 mL/min (ref >60)
Glucose, Bld: 133 mg/dL — ABNORMAL HIGH (ref 70–99)
Potassium: 3.9 mmol/L (ref 3.5–5.1)
Sodium: 137 mmol/L (ref 135–145)

## 2018-09-18 MED ORDER — TRAMADOL HCL 50 MG PO TABS: 50.0000 mg | ORAL_TABLET | Freq: Four times a day (QID) | ORAL | 0 refills | Status: DC | PRN

## 2018-09-18 MED ORDER — OXYCODONE HCL 5 MG PO TABS: 5.0000 mg | ORAL_TABLET | ORAL | 0 refills | Status: DC | PRN

## 2018-09-18 MED ORDER — ENOXAPARIN SODIUM 40 MG/0.4ML ~~LOC~~ SOLN: 40.0000 mg | SUBCUTANEOUS | 0 refills | Status: DC

## 2018-09-18 NOTE — Discharge Summary (Signed)
Physician Discharge Summary  Patient ID: Tony Mitchell MRN: [id number] DOB/AGE: 1940-11-13 78 y.o.  Admit date: 09/16/2018 Discharge date: 09/18/2018  Admission Diagnoses:  OSTEOARTHRITIS LEFT KNEE  Discharge Diagnoses: Patient Active Problem List   Diagnosis Date Noted  . Status post total knee replacement using cement, left 09/16/2018  . History of pneumothorax 11/07/2017  . Pneumothorax 09/18/2017  . Thoracic aortic aneurysm without rupture ([place]) 08/16/2017  . Primary osteoarthritis of right knee 07/10/2017  . Spinal stenosis of lumbar region with radiculopathy 07/10/2017  . GERD (gastroesophageal reflux disease) 06/13/2016  . Gout 06/13/2016  . Hyperlipidemia, unspecified 06/13/2016  . Hypertension 06/13/2016  . Pneumothorax on right 06/03/2016  . Elevated PSA 03/29/2016  . Elevated blood sugar 09/29/2015  . Rash 02/17/2015  . OSA (obstructive sleep apnea) 07/08/2014  . S/P cardiac catheterization 05/28/2014  . SOB (shortness of breath) on exertion 05/28/2014    Past Medical History:  Diagnosis Date  . Acute appendicitis 03/03/2018  . Arthritis   . COPD (chronic obstructive pulmonary disease) ([place])   . Coronary artery disease   . Difficult intubation   . GERD (gastroesophageal reflux disease)   . Gout   . Hyperlipidemia   . Hypertension   . Sleep apnea    Transfusion: None.   Consultants (if any):   Discharged Condition: Improved  Hospital Course: PAU BANH is an 78 y.o. male who was admitted 09/16/2018 with a diagnosis of primary osteoarthritis of the left knee and went to the operating room on 09/16/2018 and underwent the above named procedures.    Surgeries: Procedure(s): TOTAL KNEE ARTHROPLASTY on 09/16/2018 Patient tolerated the surgery well. Taken to PACU where she was stabilized and then transferred to the orthopedic floor.  Started on Lovenox 40mg  q 24 hrs. Foot pumps applied bilaterally at 80 mm. Heels elevated on bed with rolled towels.  No evidence of DVT. Negative [place]. Physical therapy started on day #1 for gait training and transfer. OT started day #1 for ADL and assisted devices.  Patient's IV was removed on POD2.  Implants: Left TKA using all-cemented [employer] Vanguard system with a 75 mm PCR femur, a 79 mm tibial tray with a 10 mm tear anterior stabilized e-poly insert, and a 34 x 8.5 mm all-poly 3-pegged domed patella.  He was given perioperative antibiotics:  Anti-infectives (From admission, onward)   Start     Dose/Rate Route Frequency Ordered Stop   09/16/18 1400  ceFAZolin (ANCEF) IVPB 2g/100 mL premix     2 g 200 mL/hr over 30 Minutes Intravenous Every 6 hours 09/16/18 1050 09/17/18 0212   09/16/18 0917  bacitracin 50,000 Units in sodium chloride 0.9 % 500 mL irrigation  Status:  Discontinued       As needed 09/16/18 0918 09/16/18 0955   09/16/18 0558  ceFAZolin (ANCEF) 2-4 GM/100ML-% IVPB    Note to Pharmacy:  Milinda Cave   : cabinet override      09/16/18 0558 09/16/18 0756   09/15/18 2300  ceFAZolin (ANCEF) IVPB 2g/100 mL premix     2 g 200 mL/hr over 30 Minutes Intravenous  Once 09/15/18 2251 09/16/18 0811    .  He was given sequential compression devices, early ambulation, and Lovenox for DVT prophylaxis.  He benefited maximally from the hospital stay and there were no complications.    Recent vital signs:  Vitals:   09/17/18 1740 09/18/18 0035  BP: (!) 164/99 (!) 151/78  Pulse: 93 98  Resp:  20  Temp:  99.1 F (37.3 C)  SpO2:  94%    Recent laboratory studies:  Lab Results  Component Value Date   HGB 11.0 (L) 09/18/2018   HGB 10.8 (L) 09/17/2018   HGB 11.3 (L) 09/03/2018   Lab Results  Component Value Date   WBC 8.0 09/18/2018   PLT 183 09/18/2018   Lab Results  Component Value Date   INR 0.93 09/03/2018   Lab Results  Component Value Date   NA 137 09/18/2018   K 3.9 09/18/2018   CL 106 09/18/2018   CO2 23 09/18/2018   BUN 14 09/18/2018   CREATININE 0.89 09/18/2018    GLUCOSE 133 (H) 09/18/2018   Discharge Medications:   Allergies as of 09/18/2018   No Known Allergies     Medication List    TAKE these medications   albuterol 108 (90 Base) MCG/ACT inhaler Commonly known as:  PROVENTIL HFA;VENTOLIN HFA Inhale 2 puffs into the lungs every 6 (six) hours as needed for wheezing or shortness of breath.   allopurinol 100 MG tablet Commonly known as:  ZYLOPRIM Take 100 mg by mouth daily.   ALPRAZolam 0.5 MG tablet Commonly known as:  XANAX Take 0.5 mg by mouth at bedtime.   aspirin EC 81 MG tablet Take 81 mg by mouth daily.   enoxaparin 40 MG/0.4ML injection Commonly known as:  LOVENOX Inject 0.4 mLs (40 mg total) into the skin daily.   gabapentin 300 MG capsule Commonly known as:  NEURONTIN Take 300 mg by mouth 3 (three) times daily.   hydrALAZINE 25 MG tablet Commonly known as:  APRESOLINE Take 25 mg by mouth 2 (two) times daily.   lansoprazole 15 MG capsule Commonly known as:  PREVACID Take 15 mg by mouth every evening.   lisinopril 40 MG tablet Commonly known as:  PRINIVIL,ZESTRIL Take 40 mg by mouth daily.   MEGARED OMEGA-3 KRILL OIL 500 MG Caps Take 1,000 mg by mouth daily.   oxyCODONE 5 MG immediate release tablet Commonly known as:  Oxy IR/ROXICODONE Take 1-2 tablets (5-10 mg total) by mouth every 4 (four) hours as needed for moderate pain (pain score 4-6).   simvastatin 20 MG tablet Commonly known as:  ZOCOR Take 20 mg by mouth at bedtime.   SYMBICORT 80-4.5 MCG/ACT inhaler Generic drug:  budesonide-formoterol Inhale 2 puffs into the lungs 2 (two) times daily.   traMADol 50 MG tablet Commonly known as:  ULTRAM Take 1 tablet (50 mg total) by mouth every 6 (six) hours as needed for moderate pain.   vitamin B-12 1000 MCG tablet Commonly known as:  CYANOCOBALAMIN Take 1,000 mcg by mouth daily.            Durable Medical Equipment  (From admission, onward)         Start     Ordered   09/16/18 1051  DME 3  n 1  Once     09/16/18 1050   09/16/18 1051  DME Bedside commode  Once    Question:  Patient needs a bedside commode to treat with the following condition  Answer:  Status post total knee replacement using cement, left   09/16/18 1050   09/16/18 1051  DME Walker rolling  Once    Question:  Patient needs a walker to treat with the following condition  Answer:  Status post total knee replacement using cement, left   09/16/18 1050         Diagnostic Studies: Dg Knee Left Port  Result Date:  09/16/2018 CLINICAL DATA:  Status post left knee replacement EXAM: PORTABLE LEFT KNEE - 1-2 VIEW COMPARISON:  None. FINDINGS: Knee joint replacement is noted. No acute bony or soft tissue abnormality is seen. IMPRESSION: Status post left knee replacement Electronically Signed   By: Inez Catalina M.D.   On: 09/16/2018 11:12   Disposition: Plan for discharge home this afternoon following morning session of PT.  Follow-up Information    Lattie Corns, PA-C Follow up in 14 day(s).   Specialty:  Physician Assistant Why:  Electa Sniff information: [place] [id number] [phone]          Signed: Judson Roch PA-C 09/18/2018, 7:26 AM

## 2018-09-18 NOTE — Progress Notes (Signed)
  Subjective: 2 Days Post-Op Procedure(s) (LRB): TOTAL KNEE ARTHROPLASTY (Left) Patient reports pain as mild.   Patient is well but did have issues with hypertension yesterday. Plan for discharge home today with HHPT. Negative for chest pain and shortness of breath, headache, vision changes. Fever: 99.1 last night. Gastrointestinal:Negative for nausea and vomiting  Objective: Vital signs in last 24 hours: Temp:  [98.1 F (36.7 C)-99.1 F (37.3 C)] 99.1 F (37.3 C) (01/30 0035) Pulse Rate:  [86-98] 98 (01/30 0035) Resp:  [18-20] 20 (01/30 0035) BP: (151-203)/(78-99) 151/78 (01/30 0035) SpO2:  [94 %-96 %] 94 % (01/30 0035)  Intake/Output from previous day:  Intake/Output Summary (Last 24 hours) at 09/18/2018 0716 Last data filed at 09/18/2018 0500 Gross per 24 hour  Intake -  Output 1125 ml  Net -1125 ml    Intake/Output this shift: No intake/output data recorded.  Labs: Recent Labs    09/17/18 0411 09/18/18 0338  HGB 10.8* 11.0*   Recent Labs    09/17/18 0411 09/18/18 0338  WBC 7.4 8.0  RBC 3.49* 3.47*  HCT 33.2* 33.1*  PLT 198 183   Recent Labs    09/17/18 0411 09/18/18 0338  NA 136 137  K 4.1 3.9  CL 105 106  CO2 22 23  BUN 21 14  CREATININE 1.09 0.89  GLUCOSE 130* 133*  CALCIUM 8.1* 8.5*   No results for input(s): LABPT, INR in the last 72 hours.   EXAM General - Patient is Alert, Appropriate and Oriented Extremity - Neurovascular intact Sensation intact distally Intact pulses distally Dorsiflexion/Plantar flexion intact Incision: scant drainage No cellulitis present Dressing/Incision - blood tinged drainage Motor Function - intact, moving foot and toes well on exam.  Distention improved, normal BS this AM.  Past Medical History:  Diagnosis Date  . Acute appendicitis 03/03/2018  . Arthritis   . COPD (chronic obstructive pulmonary disease) ([place])   . Coronary artery disease   . Difficult intubation   . GERD (gastroesophageal reflux  disease)   . Gout   . Hyperlipidemia   . Hypertension   . Sleep apnea     Assessment/Plan: 2 Days Post-Op Procedure(s) (LRB): TOTAL KNEE ARTHROPLASTY (Left) Active Problems:   Status post total knee replacement using cement, left  Estimated body mass index is 28.94 kg/m as calculated from the following:   Height as of this encounter: 5\' 9"  (1.753 m).   Weight as of this encounter: 88.9 kg. Advance diet Up with therapy  Labs reviewed this AM, Hg 11.0. 99.1 temp last night, encouraged incentive spirometer, WBC 8.0.  Denies any cough, SOB, urinary pain or increased frequency. Pt is passing gas without pain, will give enema/suppository prior to discharge today. HTN 203/99.  Improved with increased BP medication and IV rehydration.  Continue BP medication upon discharge. Plan for discharge home today.  DVT Prophylaxis - Lovenox, Foot Pumps and TED hose Weight-Bearing as tolerated to left leg  J. Cameron Proud, PA-C [facility] 09/18/2018, 7:16 AM

## 2018-09-18 NOTE — Discharge Instructions (Signed)
Diet: As you were doing prior to hospitalization   Shower:  May shower but keep the wounds dry, use an occlusive plastic wrap, NO SOAKING IN TUB.  If the bandage gets wet, change with a clean dry gauze.  Dressing:  You may change your dressing as needed. Change the dressing with sterile gauze dressing.    Activity:  Increase activity slowly as tolerated, but follow the weight bearing instructions below.  No lifting or driving for 6 weeks.  Weight Bearing:   Weight bearing as tolerated to left lower extremity  Blood Pressure: Take BP medication as you were prior to admission, if continued high BP, contact primary care provider.  To prevent constipation: you may use a stool softener such as -  Colace (over the counter) 100 mg by mouth twice a day  Drink plenty of fluids (prune juice may be helpful) and high fiber foods Miralax (over the counter) for constipation as needed.    Itching:  If you experience itching with your medications, try taking only a single pain pill, or even half a pain pill at a time.  You may take up to 10 pain pills per day, and you can also use benadryl over the counter for itching or also to help with sleep.   Precautions:  If you experience chest pain or shortness of breath - call 911 immediately for transfer to the hospital emergency department!!  If you develop a fever greater that 101 F, purulent drainage from wound, increased redness or drainage from wound, or calf pain-Call [facility]                                              Follow- Up Appointment:  Please call for an appointment to be seen in 2 weeks at [facility]

## 2018-09-18 NOTE — Progress Notes (Signed)
Patient is alert and oriented and able to verbalize needs. No complaints of pain at this time. VSS. PIV removed. Discahrge instructions gone over with patient at this time. Printed AVS and scripts for Lovenox, Oxycodone and ultram given to patient in discharge packet. All belongings packed up and patient escorted to car by NT and nurse via wc. Wife to transport patient home.  Bethann Punches, RN

## 2018-09-18 NOTE — Progress Notes (Signed)
Physical Therapy Treatment Patient Details Name: Tony Mitchell MRN: [id number] DOB: May 13, 1941 Today's Date: 09/18/2018    History of Present Illness Pt is a 78 year old male s/p L TKA.  PMH includes COPD, Htn and CAD.    PT Comments    Pt ready for session.  Participated in exercises as described below.  Progressing well with ROM.  Stood and was able to ambulate around nursing unit with walker and min guard/supervsiion.  He declined further step training as he reports being confident after yesterdays session.  Upon return to room asked for time in the bathroom as he had not had a BM yet.  Pt in bathroom and instructed to call nsg when finished.  Voiced understanding.   Pt with no further questions or concerns regarding discharge.   Follow Up Recommendations  Home health PT     Equipment Recommendations  None recommended by PT    Recommendations for Other Services       Precautions / Restrictions Precautions Precautions: Fall;Knee Restrictions Weight Bearing Restrictions: Yes LLE Weight Bearing: Weight bearing as tolerated    Mobility  Bed Mobility Overal bed mobility: Independent                Transfers Overall transfer level: Needs assistance Equipment used: Rolling walker (2 wheeled) Transfers: Sit to/from Stand Sit to Stand: Supervision            Ambulation/Gait Ambulation/Gait assistance: Min guard Gait Distance (Feet): 200 Feet Assistive device: Rolling walker (2 wheeled) Gait Pattern/deviations: Step-through pattern         Stairs         General stair comments: deferred training today.  Completed yesterday and pt stated he was comfortable and declined further practice.   Wheelchair Mobility    Modified Rankin (Stroke Patients Only)       Balance Overall balance assessment: Needs assistance Sitting-balance support: Feet supported Sitting balance-Leahy Scale: Good     Standing balance support: Bilateral upper extremity  supported Standing balance-Leahy Scale: Good                              Cognition Arousal/Alertness: Awake/alert Behavior During Therapy: WFL for tasks assessed/performed Overall Cognitive Status: Within Functional Limits for tasks assessed                                        Exercises Total Joint Exercises Ankle Circles/Pumps: AROM;Supine;10 reps;Both Quad Sets: Strengthening;Left;Supine;10 reps Heel Slides: Strengthening;Left;Supine;10 reps Hip ABduction/ADduction: Strengthening;Left;Supine;10 reps Straight Leg Raises: 10 reps;Supine;Left;Strengthening Goniometric ROM: 0-112    General Comments        Pertinent Vitals/Pain Pain Assessment: 0-10 Pain Score: 2  Pain Location: L knee Pain Descriptors / Indicators: Sore Pain Intervention(s): Limited activity within patient's tolerance;Monitored during session;Ice applied;Repositioned    Home Living                      Prior Function            PT Goals (current goals can now be found in the care plan section) Progress towards PT goals: Progressing toward goals    Frequency    BID      PT Plan Current plan remains appropriate    Co-evaluation              AM-PAC  PT "6 Clicks" Mobility   Outcome Measure    Help needed moving from lying on your back to sitting on the side of a flat bed without using bedrails?: None Help needed moving to and from a bed to a chair (including a wheelchair)?: None Help needed standing up from a chair using your arms (e.g., wheelchair or bedside chair)?: None Help needed to walk in hospital room?: A Little Help needed climbing 3-5 steps with a railing? : A Little 6 Click Score: 18    End of Session Equipment Utilized During Treatment: Gait belt Activity Tolerance: Patient tolerated treatment well Patient left: Other (comment) Nurse Communication: Other (comment) Pain - Right/Left: Left Pain - part of body: Knee     Time:  2025-4270 PT Time Calculation (min) (ACUTE ONLY): 25 min  Charges:  $Gait Training: 8-22 mins $Therapeutic Exercise: 8-22 mins $Therapeutic Activity: 8-22 mins                     Chesley Noon, PTA 09/18/18, 11:05 AM

## 2018-09-24 ENCOUNTER — Telehealth: Payer: Self-pay | Admitting: Licensed Clinical Social Worker

## 2018-09-24 NOTE — Telephone Encounter (Signed)
EMMI flagged patient for answering yes to loss of interest in things. Clinical Education officer, museum (CSW) contacted patient via telephone. Patient scored 0 on PHQ-9. Patient reported that he is doing good and had home health PT today. Patient reported that he is not depressed and is not having thoughts of hurting himself. Patient reported no needs or concerns.   [employer], LCSW [phone]

## 2018-11-14 IMAGING — CT CT ABD-PELV W/ CM
1 of 3 series · 14 of 32 positions shown, 19 images · IV contrast (APPLIED)
Comparison: None.

CLINICAL DATA: Right lower quadrant pain beginning yesterday,
progressively worsening. Fever.

EXAM:
CT ABDOMEN AND PELVIS WITH CONTRAST
TECHNIQUE: Multidetector CT imaging of the abdomen and pelvis was performed
using the standard protocol following bolus administration of
intravenous contrast.
CONTRAST:  100mL U1W6RH-LSS IOPAMIDOL (U1W6RH-LSS) INJECTION 61%

[Series 4: axial st · axial · 0.88mm/px · z∈[-392,-36]mm · 14 of 81 slices shown, 19 images]
[im 5/81  soft-tissue]
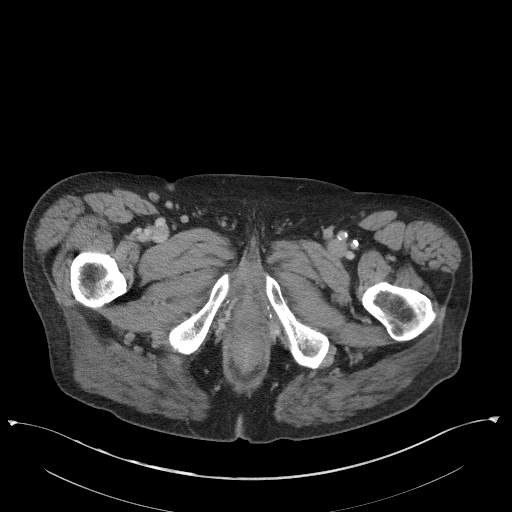
[im 5/81  bone]
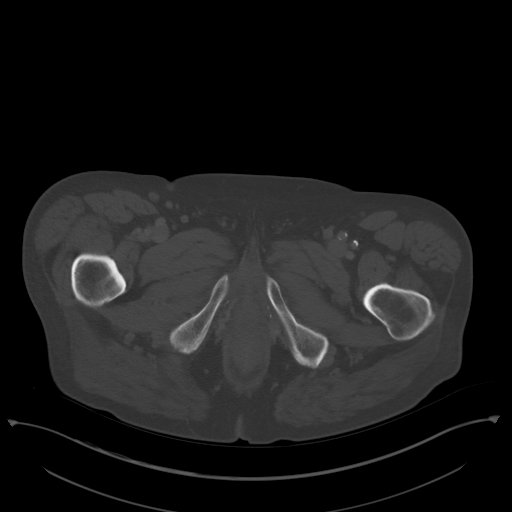
[im 13/81  soft-tissue]
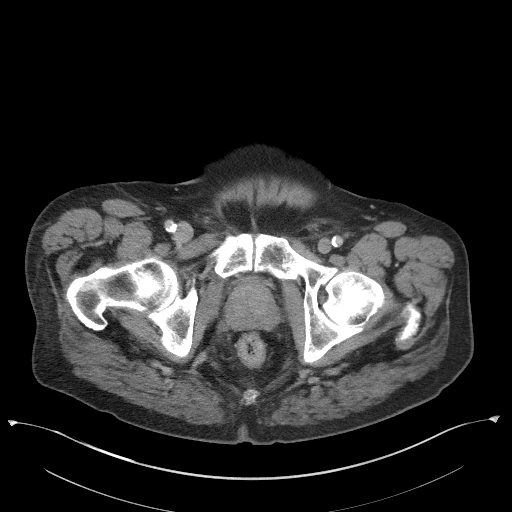
[im 17/81  soft-tissue]
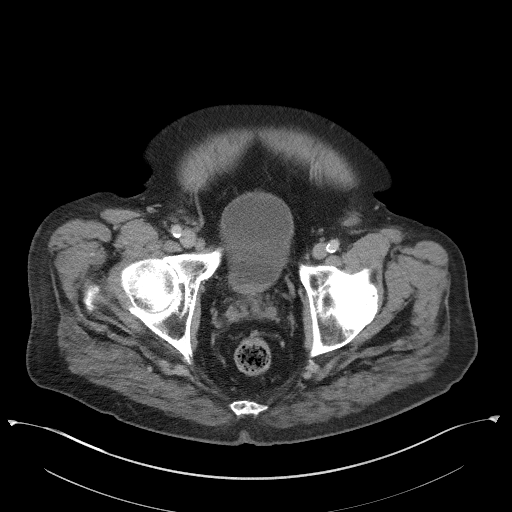
[im 22/81  soft-tissue]
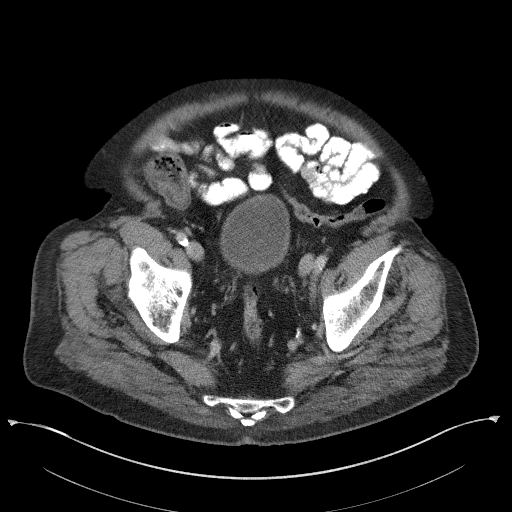
[im 30/81  soft-tissue]
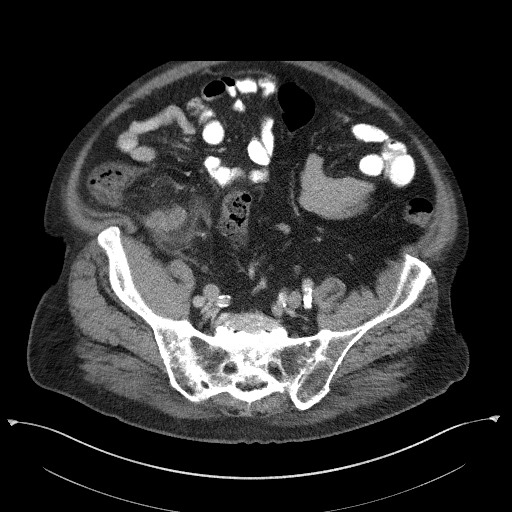
[im 34/81  soft-tissue]
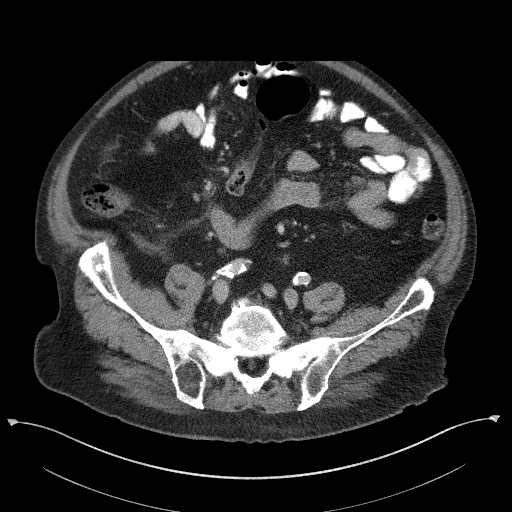
[im 43/81  soft-tissue]
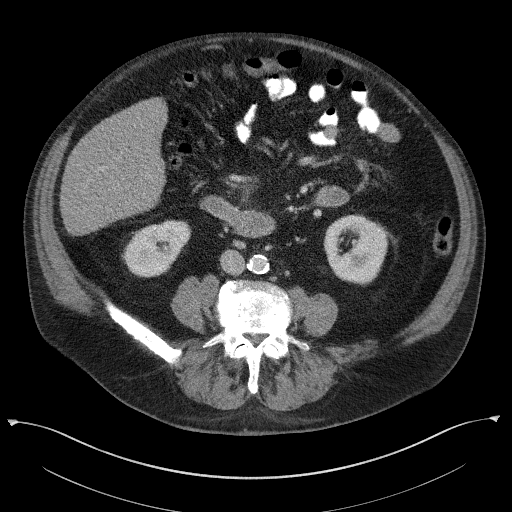
[im 47/81  soft-tissue]
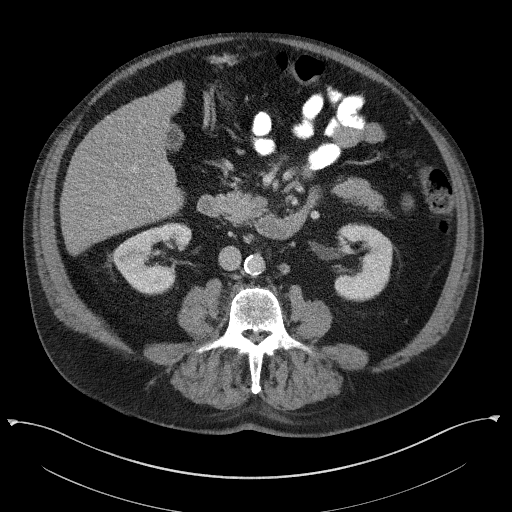
[im 51/81  soft-tissue]
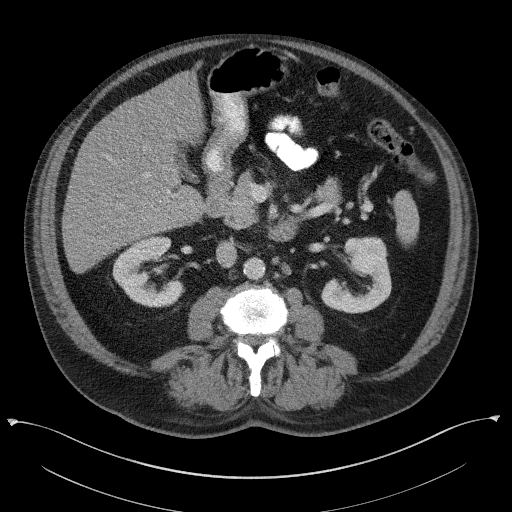
[im 51/81  bone]
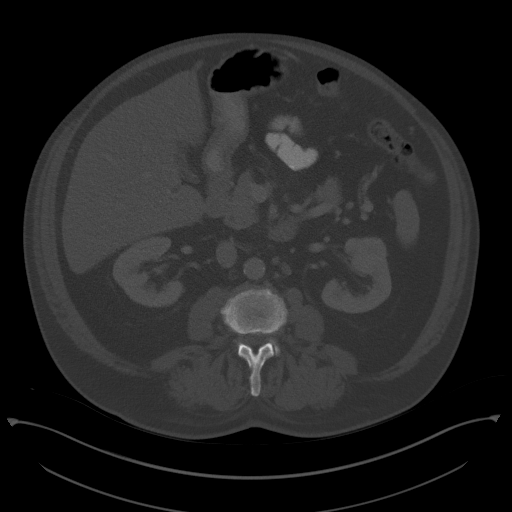
[im 59/81  soft-tissue]
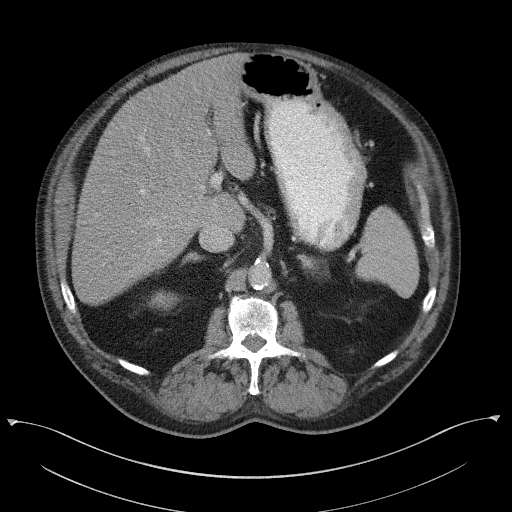
[im 64/81  soft-tissue]
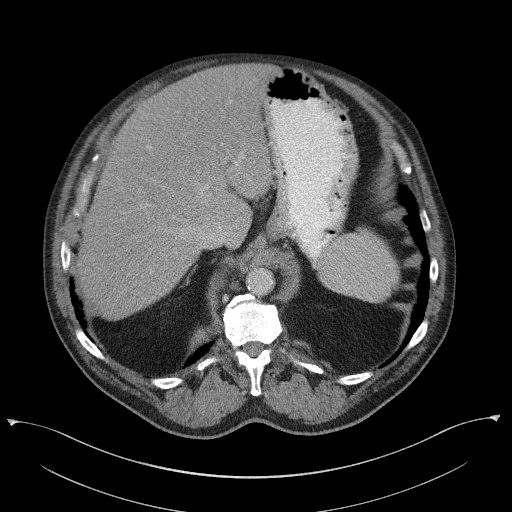
[im 64/81  lung]
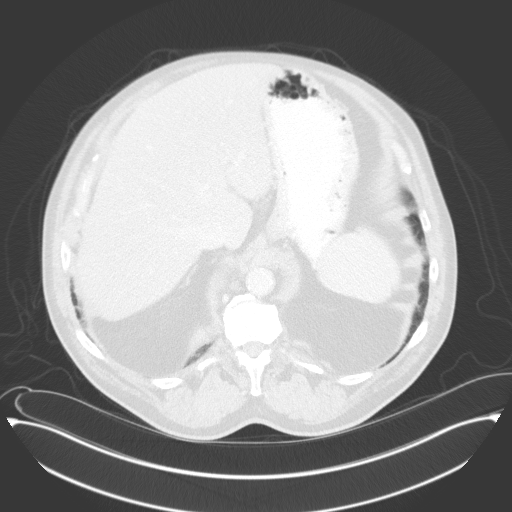
[im 68/81  soft-tissue]
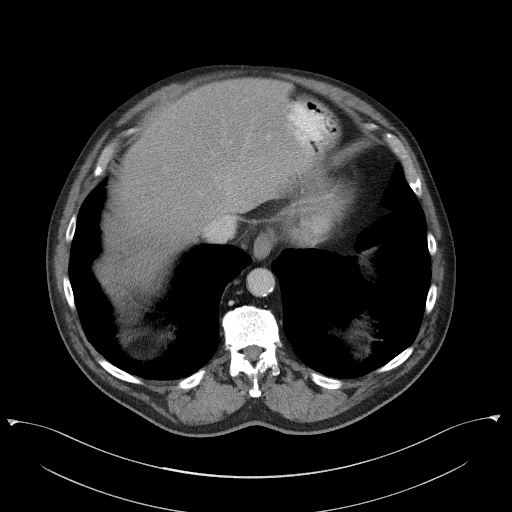
[im 68/81  lung]
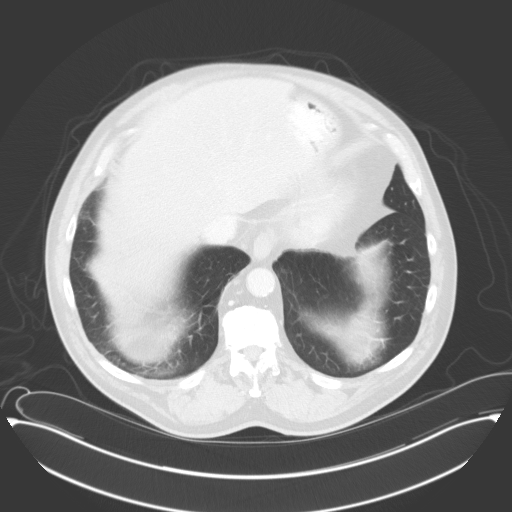
[im 72/81  lung]
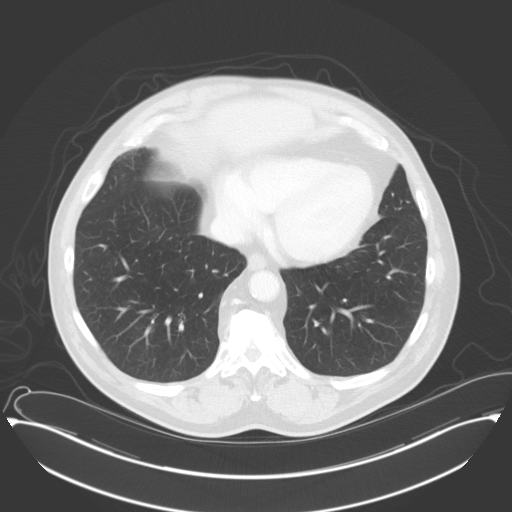
[im 76/81  soft-tissue]
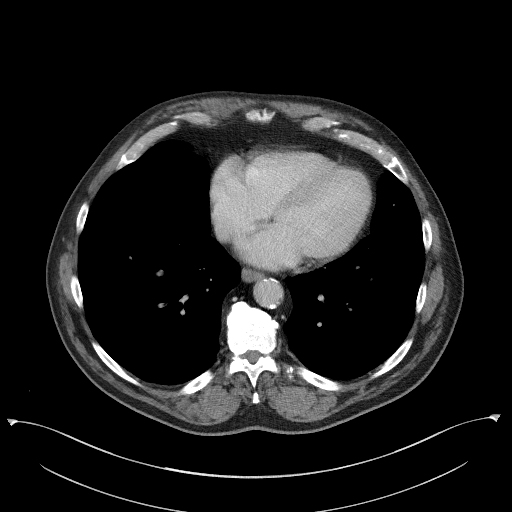
[im 76/81  lung]
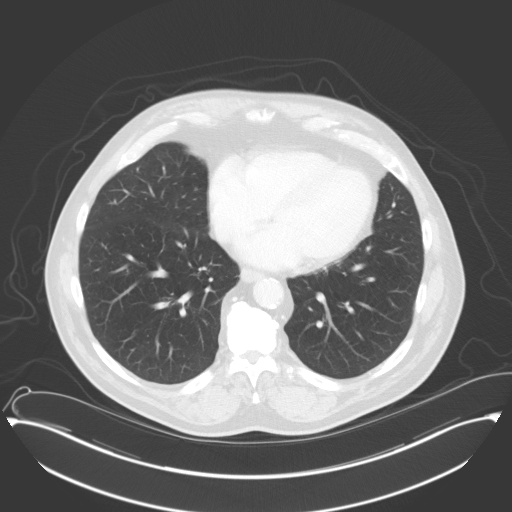

[14 of 32 positions shown; findings below may reference images not displayed]

FINDINGS: Lower chest: No acute abnormality.

Hepatobiliary: Decreased attenuation of the liver consistent with
fatty infiltration. No mass or focal lesion. Normal gallbladder. No
bile duct dilation

Pancreas: Unremarkable. No pancreatic ductal dilatation or
surrounding inflammatory changes.

Spleen: Normal in size without focal abnormality.

Adrenals/Urinary Tract: Adrenal glands are unremarkable. Kidneys are
normal, without renal calculi, focal lesion, or hydronephrosis.
Bladder is unremarkable.

Stomach/Bowel: Appendix is dilated to 13 mm. There is surrounding
phlegm [REDACTED] stranding. No extraluminal air. No periappendiceal
fluid collection is seen to suggest an abscess.

Stomach, small bowel and colon are unremarkable.

Vascular/Lymphatic: Atherosclerotic calcifications are noted
throughout a normal caliber abdominal aorta. No pathologically
enlarged lymph nodes.

Reproductive: Mild enlargement of prostate gland measuring 4.5 cm
transversely.

Other: No hernia.  No ascites.

Musculoskeletal: No fracture or acute finding. No osteoblastic or
osteolytic lesions.
IMPRESSION: 1. Acute appendicitis. No extraluminal air. No periappendiceal
abscess.
2. No other acute findings.
3. Hepatic steatosis.
4. Aortic atherosclerosis.

## 2019-08-17 ENCOUNTER — Other Ambulatory Visit: Payer: Medicare Other

## 2019-08-24 ENCOUNTER — Other Ambulatory Visit: Admission: RE | Admit: 2019-08-24 | Payer: Medicare Other | Source: Ambulatory Visit

## 2019-08-25 ENCOUNTER — Inpatient Hospital Stay: Admit: 2019-08-25 | Payer: Medicare Other | Admitting: Surgery

## 2019-08-25 SURGERY — ARTHROPLASTY, KNEE, TOTAL
Anesthesia: Choice | Site: Knee | Laterality: Right

## 2020-04-12 ENCOUNTER — Encounter: Payer: Self-pay | Admitting: Urology

## 2020-04-12 ENCOUNTER — Other Ambulatory Visit: Payer: Self-pay

## 2020-04-12 ENCOUNTER — Ambulatory Visit (INDEPENDENT_AMBULATORY_CARE_PROVIDER_SITE_OTHER): Payer: Medicare Other | Admitting: Urology

## 2020-04-12 VITALS — BP 177/74 | HR 105 | Ht 69.0 in | Wt 200.0 lb

## 2020-04-12 DIAGNOSIS — R972 Elevated prostate specific antigen [PSA]: Secondary | ICD-10-CM | POA: Diagnosis not present

## 2020-04-12 NOTE — Progress Notes (Signed)
   04/12/2020 10:52 AM   [place] 1941-05-05 [id number]  Reason for visit: Follow up elevated PSA  HPI: I saw Tony Mitchell in urology clinic today for a long history of elevated PSA.  He has previously been followed in our clinic by Zara Council, PA, Dr. Louis Meckel, and Dr. Vikki Ports.  He has a long history of elevated PSA ranging from 6-9, including a PSA of 8.2 with 32% free in 2017.  He also has a distant history of a negative prostate biopsy.  He denies any family history of prostate cancer.  He was re-referred for a PSA of 7.96 in August 2021 which is stable over the last 5 years.  He denies any significant urinary symptoms.  He uses CPAP overnight and gets up at 7 AM for his first void.  He denies any gross hematuria or other problems.  DRE today 50 g prostate, smooth, no nodules or masses  We reviewed the implications of an elevated PSA and the uncertainty surrounding it. In general, a man's PSA increases with age and is produced by both normal and cancerous prostate tissue. The differential diagnosis for elevated PSA includes BPH, prostate cancer, infection, recent intercourse/ejaculation, recent urethroscopic manipulation (foley placement/cystoscopy) or trauma, and prostatitis.    We reviewed the AUA guidelines at length that do not recommend routine screening in men over age 84.  We also reviewed his very stable PSA trend, as well as the importance of the significantly elevated percentage free PSA which typically indicates a benign PSA elevation.  Management of an elevated PSA can include observation or prostate biopsy and we discussed this in detail. Our goal is to detect clinically significant prostate cancers, and manage with either active surveillance, surgery, or radiation for localized disease. Risks of prostate biopsy include bleeding, infection (including life threatening sepsis), pain, and lower urinary symptoms. Hematuria, hematospermia, and blood in the stool are all  common after biopsy and can persist up to 4 weeks.  We discussed the very low, but not 0, risk of developing a clinically significant prostate cancer by deferring biopsy and discontinuing screening.  I recommended discontinuing PSA screening based on his negative DRE, stable PSA over the last 5 years, history of negative prostate biopsy, and reassuring 32% free on PSA in 2017.  I spent 35 total minutes on the day of the encounter including pre-visit review of the medical record, face-to-face time with the patient, and post visit ordering of labs/imaging/tests.  [place] [employer], [facility] [address] [place], [place] [address] [phone]

## 2020-04-12 NOTE — Patient Instructions (Signed)
Prostate Cancer Screening  Prostate cancer screening is a test that is done to check for the presence of prostate cancer in men. The prostate gland is a walnut-sized gland that is located below the bladder and in front of the rectum in males. The function of the prostate is to add fluid to semen during ejaculation. Prostate cancer is the second most common type of cancer in men. Who should have prostate cancer screening?  Screening recommendations vary based on age and other risk factors. Screening is recommended if:  You are older than age 55. If you are age 55-69, talk with your health care provider about your need for screening and how often screening should be done. Because most prostate cancers are slow growing and will not cause death, screening is generally reserved in this age group for men who have a 10-15-year life expectancy.  You are younger than age 55, and you have these risk factors: ? Being a black male or a male of African descent. ? Having a father, brother, or uncle who has been diagnosed with prostate cancer. The risk is higher if your family member's cancer occurred at an early age. Screening is not recommended if:  You are younger than age 40.  You are between the ages of 40 and 54 and you have no risk factors.  You are 70 years of age or older. At this age, the risks that screening can cause are greater than the benefits that it may provide. If you are at high risk for prostate cancer, your health care provider may recommend that you have screenings more often or that you start screening at a younger age. How is screening for prostate cancer done? The recommended prostate cancer screening test is a blood test called the prostate-specific antigen (PSA) test. PSA is a protein that is made in the prostate. As you age, your prostate naturally produces more PSA. Abnormally high PSA levels may be caused by:  Prostate cancer.  An enlarged prostate that is not caused by cancer  (benign prostatic hyperplasia, BPH). This condition is very common in older men.  A prostate gland infection (prostatitis). Depending on the PSA results, you may need more tests, such as:  A physical exam to check the size of your prostate gland.  Blood and imaging tests.  A procedure to remove tissue samples from your prostate gland for testing (biopsy). What are the benefits of prostate cancer screening?  Screening can help to identify cancer at an early stage, before symptoms start and when the cancer can be treated more easily.  There is a small chance that screening may lower your risk of dying from prostate cancer. The chance is small because prostate cancer is a slow-growing cancer, and most men with prostate cancer die from a different cause. What are the risks of prostate cancer screening? The main risk of prostate cancer screening is diagnosing and treating prostate cancer that would never have caused any symptoms or problems. This is called overdiagnosisand overtreatment. PSA screening cannot tell you if your PSA is high due to cancer or a different cause. A prostate biopsy is the only procedure to diagnose prostate cancer. Even the results of a biopsy may not tell you if your cancer needs to be treated. Slow-growing prostate cancer may not need any treatment other than monitoring, so diagnosing and treating it may cause unnecessary stress or other side effects. A prostate biopsy may also cause:  Infection or fever.  A false negative. This is   a result that shows that you do not have prostate cancer when you actually do have prostate cancer. Questions to ask your health care provider  When should I start prostate cancer screening?  What is my risk for prostate cancer?  How often do I need screening?  What type of screening tests do I need?  How do I get my test results?  What do my results mean?  Do I need treatment? Where to find more information  The American Cancer  Society: www.cancer.org  American Urological Association: www.auanet.org Contact a health care provider if:  You have difficulty urinating.  You have pain when you urinate or ejaculate.  You have blood in your urine or semen.  You have pain in your back or in the area of your prostate. Summary  Prostate cancer is a common type of cancer in men. The prostate gland is located below the bladder and in front of the rectum. This gland adds fluid to semen during ejaculation.  Prostate cancer screening may identify cancer at an early stage, when the cancer can be treated more easily.  The prostate-specific antigen (PSA) test is the recommended screening test for prostate cancer.  Discuss the risks and benefits of prostate cancer screening with your health care provider. If you are age 70 or older, the risks that screening can cause are greater than the benefits that it may provide. This information is not intended to replace advice given to you by your health care provider. Make sure you discuss any questions you have with your health care provider. Document Revised: 03/19/2019 Document Reviewed: 03/19/2019 Elsevier Patient Education  2020 [employer]  

## 2020-04-18 ENCOUNTER — Ambulatory Visit: Payer: Medicare Other | Admitting: Urology

## 2020-10-05 ENCOUNTER — Other Ambulatory Visit: Payer: Self-pay | Admitting: Nephrology

## 2020-10-05 DIAGNOSIS — D631 Anemia in chronic kidney disease: Secondary | ICD-10-CM

## 2020-10-05 DIAGNOSIS — R8290 Unspecified abnormal findings in urine: Secondary | ICD-10-CM

## 2020-10-05 DIAGNOSIS — N184 Chronic kidney disease, stage 4 (severe): Secondary | ICD-10-CM

## 2020-10-05 DIAGNOSIS — E1122 Type 2 diabetes mellitus with diabetic chronic kidney disease: Secondary | ICD-10-CM

## 2020-10-05 DIAGNOSIS — R809 Proteinuria, unspecified: Secondary | ICD-10-CM

## 2020-10-07 ENCOUNTER — Telehealth (HOSPITAL_COMMUNITY): Payer: Self-pay

## 2020-10-12 ENCOUNTER — Other Ambulatory Visit: Payer: Self-pay

## 2020-10-12 ENCOUNTER — Ambulatory Visit
Admission: RE | Admit: 2020-10-12 | Discharge: 2020-10-12 | Disposition: A | Discharge status: Home / Self Care | Payer: Medicare Other | Source: Ambulatory Visit | Attending: Nephrology | Admitting: Nephrology

## 2020-10-12 DIAGNOSIS — N184 Chronic kidney disease, stage 4 (severe): Secondary | ICD-10-CM | POA: Insufficient documentation

## 2020-10-12 DIAGNOSIS — R8290 Unspecified abnormal findings in urine: Secondary | ICD-10-CM | POA: Diagnosis not present

## 2020-10-12 DIAGNOSIS — E1122 Type 2 diabetes mellitus with diabetic chronic kidney disease: Secondary | ICD-10-CM | POA: Insufficient documentation

## 2020-10-12 DIAGNOSIS — D631 Anemia in chronic kidney disease: Secondary | ICD-10-CM | POA: Diagnosis present

## 2020-10-12 DIAGNOSIS — R809 Proteinuria, unspecified: Secondary | ICD-10-CM | POA: Insufficient documentation

## 2020-11-03 ENCOUNTER — Inpatient Hospital Stay: Payer: Medicare Other

## 2020-11-03 ENCOUNTER — Inpatient Hospital Stay
Admission: EM | Admit: 2020-11-03 | Discharge: 2020-11-06 | DRG: 683 | Disposition: A | Discharge status: Home / Self Care | Payer: Medicare Other | Attending: Internal Medicine | Admitting: Internal Medicine

## 2020-11-03 ENCOUNTER — Other Ambulatory Visit: Payer: Self-pay

## 2020-11-03 DIAGNOSIS — N170 Acute kidney failure with tubular necrosis: Secondary | ICD-10-CM | POA: Diagnosis not present

## 2020-11-03 DIAGNOSIS — N1832 Chronic kidney disease, stage 3b: Secondary | ICD-10-CM | POA: Diagnosis present

## 2020-11-03 DIAGNOSIS — Z20822 Contact with and (suspected) exposure to covid-19: Secondary | ICD-10-CM | POA: Diagnosis present

## 2020-11-03 DIAGNOSIS — Z79890 Hormone replacement therapy (postmenopausal): Secondary | ICD-10-CM

## 2020-11-03 DIAGNOSIS — E875 Hyperkalemia: Secondary | ICD-10-CM | POA: Diagnosis present

## 2020-11-03 DIAGNOSIS — Z79899 Other long term (current) drug therapy: Secondary | ICD-10-CM

## 2020-11-03 DIAGNOSIS — A040 Enteropathogenic Escherichia coli infection: Secondary | ICD-10-CM | POA: Diagnosis not present

## 2020-11-03 DIAGNOSIS — Z7984 Long term (current) use of oral hypoglycemic drugs: Secondary | ICD-10-CM | POA: Diagnosis not present

## 2020-11-03 DIAGNOSIS — I129 Hypertensive chronic kidney disease with stage 1 through stage 4 chronic kidney disease, or unspecified chronic kidney disease: Secondary | ICD-10-CM | POA: Diagnosis present

## 2020-11-03 DIAGNOSIS — E781 Pure hyperglyceridemia: Secondary | ICD-10-CM | POA: Diagnosis present

## 2020-11-03 DIAGNOSIS — E872 Acidosis: Secondary | ICD-10-CM | POA: Diagnosis present

## 2020-11-03 DIAGNOSIS — Z87891 Personal history of nicotine dependence: Secondary | ICD-10-CM

## 2020-11-03 DIAGNOSIS — D649 Anemia, unspecified: Secondary | ICD-10-CM

## 2020-11-03 DIAGNOSIS — M1990 Unspecified osteoarthritis, unspecified site: Secondary | ICD-10-CM | POA: Diagnosis present

## 2020-11-03 DIAGNOSIS — I10 Essential (primary) hypertension: Secondary | ICD-10-CM | POA: Diagnosis not present

## 2020-11-03 DIAGNOSIS — I2510 Atherosclerotic heart disease of native coronary artery without angina pectoris: Secondary | ICD-10-CM | POA: Diagnosis present

## 2020-11-03 DIAGNOSIS — Z809 Family history of malignant neoplasm, unspecified: Secondary | ICD-10-CM

## 2020-11-03 DIAGNOSIS — R197 Diarrhea, unspecified: Secondary | ICD-10-CM

## 2020-11-03 DIAGNOSIS — E785 Hyperlipidemia, unspecified: Secondary | ICD-10-CM | POA: Diagnosis present

## 2020-11-03 DIAGNOSIS — M48061 Spinal stenosis, lumbar region without neurogenic claudication: Secondary | ICD-10-CM | POA: Diagnosis present

## 2020-11-03 DIAGNOSIS — G4733 Obstructive sleep apnea (adult) (pediatric): Secondary | ICD-10-CM | POA: Diagnosis present

## 2020-11-03 DIAGNOSIS — Z96652 Presence of left artificial knee joint: Secondary | ICD-10-CM | POA: Diagnosis present

## 2020-11-03 DIAGNOSIS — K219 Gastro-esophageal reflux disease without esophagitis: Secondary | ICD-10-CM | POA: Diagnosis present

## 2020-11-03 DIAGNOSIS — M109 Gout, unspecified: Secondary | ICD-10-CM | POA: Diagnosis present

## 2020-11-03 DIAGNOSIS — J449 Chronic obstructive pulmonary disease, unspecified: Secondary | ICD-10-CM | POA: Diagnosis present

## 2020-11-03 DIAGNOSIS — N179 Acute kidney failure, unspecified: Principal | ICD-10-CM | POA: Diagnosis present

## 2020-11-03 DIAGNOSIS — Z7982 Long term (current) use of aspirin: Secondary | ICD-10-CM

## 2020-11-03 DIAGNOSIS — D631 Anemia in chronic kidney disease: Secondary | ICD-10-CM | POA: Diagnosis present

## 2020-11-03 DIAGNOSIS — R972 Elevated prostate specific antigen [PSA]: Secondary | ICD-10-CM | POA: Diagnosis present

## 2020-11-03 DIAGNOSIS — I712 Thoracic aortic aneurysm, without rupture: Secondary | ICD-10-CM | POA: Diagnosis present

## 2020-11-03 DIAGNOSIS — D638 Anemia in other chronic diseases classified elsewhere: Secondary | ICD-10-CM | POA: Diagnosis present

## 2020-11-03 DIAGNOSIS — E1122 Type 2 diabetes mellitus with diabetic chronic kidney disease: Secondary | ICD-10-CM | POA: Diagnosis present

## 2020-11-03 DIAGNOSIS — E1169 Type 2 diabetes mellitus with other specified complication: Secondary | ICD-10-CM

## 2020-11-03 LAB — BASIC METABOLIC PANEL
Anion gap: 10 (ref 5–15)
Anion gap: 9 (ref 5–15)
BUN: 78 mg/dL — ABNORMAL HIGH (ref 8–23)
BUN: 71 mg/dL — ABNORMAL HIGH (ref 8–23)
CO2: 16 mmol/L — ABNORMAL LOW (ref 22–32)
CO2: 16 mmol/L — ABNORMAL LOW (ref 22–32)
Calcium: 9 mg/dL (ref 8.9–10.3)
Calcium: 8.7 mg/dL — ABNORMAL LOW (ref 8.9–10.3)
Chloride: 110 mmol/L (ref 98–111)
Chloride: 112 mmol/L — ABNORMAL HIGH (ref 98–111)
Creatinine, Ser: 4.86 mg/dL — ABNORMAL HIGH (ref 0.61–1.24)
Creatinine, Ser: 4.13 mg/dL — ABNORMAL HIGH (ref 0.61–1.24)
GFR, Estimated: 11 mL/min — ABNORMAL LOW (ref >60)
GFR, Estimated: 14 mL/min — ABNORMAL LOW (ref >60)
Glucose, Bld: 110 mg/dL — ABNORMAL HIGH (ref 70–99)
Glucose, Bld: 119 mg/dL — ABNORMAL HIGH (ref 70–99)
Potassium: 6.1 mmol/L — ABNORMAL HIGH (ref 3.5–5.1)
Potassium: 4.6 mmol/L (ref 3.5–5.1)
Sodium: 136 mmol/L (ref 135–145)
Sodium: 137 mmol/L (ref 135–145)

## 2020-11-03 LAB — URINALYSIS, COMPLETE (UACMP) WITH MICROSCOPIC
Bacteria, UA: NONE SEEN
Bilirubin Urine: NEGATIVE
Glucose, UA: 150 mg/dL — AB
Ketones, ur: NEGATIVE mg/dL
Leukocytes,Ua: NEGATIVE
Nitrite: NEGATIVE
Protein, ur: NEGATIVE mg/dL
Specific Gravity, Urine: 1.008 (ref 1.005–1.030)
Squamous Epithelial / HPF: NONE SEEN (ref 0–5)
pH: 5 (ref 5.0–8.0)

## 2020-11-03 LAB — CBC
HCT: 29.5 % — ABNORMAL LOW (ref 39.0–52.0)
Hemoglobin: 10 g/dL — ABNORMAL LOW (ref 13.0–17.0)
MCH: 32.4 pg (ref 26.0–34.0)
MCHC: 33.9 g/dL (ref 30.0–36.0)
MCV: 95.5 fL (ref 80.0–100.0)
Platelets: 255 10*3/uL (ref 150–400)
RBC: 3.09 MIL/uL — ABNORMAL LOW (ref 4.22–5.81)
RDW: 13.2 % (ref 11.5–15.5)
WBC: 8.2 10*3/uL (ref 4.0–10.5)
nRBC: 0 % (ref 0.0–0.2)

## 2020-11-03 MED ORDER — ENOXAPARIN SODIUM 30 MG/0.3ML ~~LOC~~ SOLN: 30.0000 mg | SUBCUTANEOUS | Status: DC

## 2020-11-03 MED ORDER — FENOFIBRATE 160 MG PO TABS
160.0000 mg | ORAL_TABLET | Freq: Every day | ORAL | Status: DC
  Administered 2020-11-04 – 2020-11-06 (×3): 160 mg via ORAL
  Filled 2020-11-03 (×3): qty 1

## 2020-11-03 MED ORDER — AMLODIPINE BESYLATE 5 MG PO TABS
2.5000 mg | ORAL_TABLET | Freq: Every day | ORAL | Status: DC
  Administered 2020-11-03 – 2020-11-06 (×4): 2.5 mg via ORAL
  Filled 2020-11-03 (×4): qty 1

## 2020-11-03 MED ORDER — ALBUTEROL SULFATE HFA 108 (90 BASE) MCG/ACT IN AERS
2.0000 | INHALATION_SPRAY | Freq: Four times a day (QID) | RESPIRATORY_TRACT | Status: DC | PRN
  Filled 2020-11-03: qty 6.7

## 2020-11-03 MED ORDER — SODIUM CHLORIDE 0.9 % IV BOLUS
1000.0000 mL | Freq: Once | INTRAVENOUS | Status: AC
  Administered 2020-11-03: 1000 mL via INTRAVENOUS

## 2020-11-03 MED ORDER — HEPARIN SODIUM (PORCINE) 5000 UNIT/ML IJ SOLN
5000.0000 [IU] | Freq: Three times a day (TID) | INTRAMUSCULAR | Status: DC
  Administered 2020-11-03 – 2020-11-06 (×8): 5000 [IU] via SUBCUTANEOUS
  Filled 2020-11-03 (×8): qty 1

## 2020-11-03 MED ORDER — SODIUM CHLORIDE 0.9 % IV SOLN: INTRAVENOUS | Status: DC

## 2020-11-03 MED ORDER — HYDRALAZINE HCL 25 MG PO TABS
25.0000 mg | ORAL_TABLET | Freq: Two times a day (BID) | ORAL | Status: DC
  Administered 2020-11-03 – 2020-11-06 (×6): 25 mg via ORAL
  Filled 2020-11-03 (×6): qty 1

## 2020-11-03 MED ORDER — VITAMIN B-12 1000 MCG PO TABS
1000.0000 ug | ORAL_TABLET | Freq: Every day | ORAL | Status: DC
  Administered 2020-11-04 – 2020-11-06 (×3): 1000 ug via ORAL
  Filled 2020-11-03 (×3): qty 1

## 2020-11-03 MED ORDER — ASPIRIN EC 81 MG PO TBEC
81.0000 mg | DELAYED_RELEASE_TABLET | Freq: Every day | ORAL | Status: DC
  Administered 2020-11-03 – 2020-11-06 (×4): 81 mg via ORAL
  Filled 2020-11-03 (×4): qty 1

## 2020-11-03 MED ORDER — MOMETASONE FURO-FORMOTEROL FUM 100-5 MCG/ACT IN AERO
2.0000 | INHALATION_SPRAY | Freq: Two times a day (BID) | RESPIRATORY_TRACT | Status: DC
  Administered 2020-11-03 – 2020-11-06 (×6): 2 via RESPIRATORY_TRACT
  Filled 2020-11-03: qty 8.8

## 2020-11-03 MED ORDER — STERILE WATER FOR INJECTION IV SOLN
INTRAVENOUS | Status: DC
  Filled 2020-11-03: qty 850
  Filled 2020-11-03: qty 150
  Filled 2020-11-03: qty 850
  Filled 2020-11-03: qty 150

## 2020-11-03 MED ORDER — SIMVASTATIN 20 MG PO TABS
20.0000 mg | ORAL_TABLET | Freq: Every day | ORAL | Status: DC
  Administered 2020-11-03 – 2020-11-05 (×3): 20 mg via ORAL
  Filled 2020-11-03 (×3): qty 1

## 2020-11-03 MED ORDER — SODIUM ZIRCONIUM CYCLOSILICATE 10 G PO PACK
10.0000 g | PACK | Freq: Once | ORAL | Status: AC
  Administered 2020-11-03: 10 g via ORAL
  Filled 2020-11-03: qty 1

## 2020-11-03 NOTE — ED Provider Notes (Signed)
[facility] Emergency Department Provider Note   ____________________________________________   Event Date/Time   First MD Initiated Contact with Patient 11/03/20 1423     (approximate)  I have reviewed the triage vital signs and the nursing notes.   HISTORY  Chief Complaint Abnormal Lab    HPI Tony Mitchell is a 80 y.o. male with past medical history of hypertension, hyperlipidemia, COPD, CAD, and gout who presents to the ED for abnormal labs.  Patient reports that he went to see his kidney doctor yesterday and was called this morning to be told that his potassium was high.  He states that he feels fine and he has not noticed any decreased urine output, abdominal pain, nausea, vomiting, or flank pain.  He also denies any fevers or dysuria.  He has not had any changes in his medications recently.        Past Medical History:  Diagnosis Date  . Acute appendicitis 03/03/2018  . Arthritis   . COPD (chronic obstructive pulmonary disease) ([place])   . Coronary artery disease   . Difficult intubation   . GERD (gastroesophageal reflux disease)   . Gout   . Hyperlipidemia   . Hypertension   . Sleep apnea     Patient Active Problem List   Diagnosis Date Noted  . Status post total knee replacement using cement, left 09/16/2018  . History of pneumothorax 11/07/2017  . Pneumothorax 09/18/2017  . Thoracic aortic aneurysm without rupture ([place]) 08/16/2017  . Primary osteoarthritis of right knee 07/10/2017  . Spinal stenosis of lumbar region with radiculopathy 07/10/2017  . GERD (gastroesophageal reflux disease) 06/13/2016  . Gout 06/13/2016  . Hyperlipidemia, unspecified 06/13/2016  . Hypertension 06/13/2016  . Pneumothorax on right 06/03/2016  . Elevated PSA 03/29/2016  . Elevated blood sugar 09/29/2015  . Rash 02/17/2015  . OSA (obstructive sleep apnea) 07/08/2014  . S/P cardiac catheterization 05/28/2014  . SOB (shortness of breath) on exertion  05/28/2014    Past Surgical History:  Procedure Laterality Date  . CATARACT EXTRACTION  2012  . KNEE SURGERY Left 1965  . LAPAROSCOPIC APPENDECTOMY N/A 03/03/2018   Procedure: APPENDECTOMY LAPAROSCOPIC;  Surgeon: Vickie Epley, MD;  Location: [facility] ORS;  Service: General;  Laterality: N/A;  . TOTAL KNEE ARTHROPLASTY Left 09/16/2018   Procedure: TOTAL KNEE ARTHROPLASTY;  Surgeon: Corky Mull, MD;  Location: [facility] ORS;  Service: Orthopedics;  Laterality: Left;    Prior to Admission medications   Medication Sig Start Date End Date Taking? Authorizing Provider  albuterol (PROVENTIL HFA;VENTOLIN HFA) 108 (90 Base) MCG/ACT inhaler Inhale 2 puffs into the lungs every 6 (six) hours as needed for wheezing or shortness of breath. 08/08/17   Arta Silence, MD  allopurinol (ZYLOPRIM) 100 MG tablet Take 100 mg by mouth daily.  06/17/17   [provider]  amLODipine (NORVASC) 2.5 MG tablet Take 2.5 mg by mouth daily. 04/08/20   [provider]  aspirin EC 81 MG tablet Take 81 mg by mouth daily.    [provider]  cloNIDine (CATAPRES) 0.3 MG tablet Take 1 tablet by mouth in the morning and at bedtime.    [provider]  fenofibrate (TRICOR) 145 MG tablet Take 145 mg by mouth daily. 02/25/20   [provider]  gabapentin (NEURONTIN) 300 MG capsule Take 300 mg by mouth 3 (three) times daily.    [provider]  hydrALAZINE (APRESOLINE) 25 MG tablet Take 25 mg by mouth 2 (two) times  daily.    [provider]  labetalol (NORMODYNE) 300 MG tablet Take 1 tablet by mouth daily.    [provider]  lansoprazole (PREVACID) 15 MG capsule Take 15 mg by mouth every evening.    [provider]  lisinopril (ZESTRIL) 30 MG tablet Take 30 mg by mouth daily. 03/18/20   [provider]  MEGARED OMEGA-3 KRILL OIL 500 MG CAPS Take 1,000 mg by mouth daily.    [provider]  metFORMIN (GLUCOPHAGE) 500 MG tablet Take 500  mg by mouth daily. 02/17/20   [provider]  simvastatin (ZOCOR) 20 MG tablet Take 20 mg by mouth at bedtime.  04/01/18   [provider]  SYMBICORT 80-4.5 MCG/ACT inhaler Inhale 2 puffs into the lungs 2 (two) times daily. 09/11/17   [provider]  vitamin B-12 (CYANOCOBALAMIN) 1000 MCG tablet Take 1,000 mcg by mouth daily.    [provider]    Allergies Patient has no known allergies.  Family History  Problem Relation Age of Onset  . Cancer Father        Unknown  . Prostate cancer Neg Hx   . Lung cancer Neg Hx     Social History Social History   Tobacco Use  . Smoking status: Former Smoker    Types: Cigarettes    Quit date: 06/15/1996    Years since quitting: 24.4  . Smokeless tobacco: Never Used  Vaping Use  . Vaping Use: Never used  Substance Use Topics  . Alcohol use: Yes    Comment: 1 mixed drink daily  . Drug use: No    Review of Systems  Constitutional: No fever/chills.  Positive for abnormal labs. Eyes: No visual changes. ENT: No sore throat. Cardiovascular: Denies chest pain. Respiratory: Denies shortness of breath. Gastrointestinal: No abdominal pain.  No nausea, no vomiting.  No diarrhea.  No constipation. Genitourinary: Negative for dysuria. Musculoskeletal: Negative for back pain. Skin: Negative for rash. Neurological: Negative for headaches, focal weakness or numbness.  ____________________________________________   PHYSICAL EXAM:  VITAL SIGNS: ED Triage Vitals [11/03/20 1411]  Enc Vitals Group     BP (!) 150/78     Pulse Rate (!) 103     Resp 16     Temp 98.2 F (36.8 C)     Temp Source Oral     SpO2 99 %     Weight 198 lb (89.8 kg)     Height 5\' 9"  (1.753 m)     Head Circumference      Peak Flow      Pain Score 0     Pain Loc      Pain Edu?      Excl. in [place]?     Constitutional: Alert and oriented. Eyes: Conjunctivae are normal. Head: Atraumatic. Nose: No  congestion/rhinnorhea. Mouth/Throat: Mucous membranes are moist. Neck: Normal ROM Cardiovascular: Normal rate, regular rhythm. Grossly normal heart sounds. Respiratory: Normal respiratory effort.  No retractions. Lungs CTAB. Gastrointestinal: Soft and nontender. No distention. Genitourinary: deferred Musculoskeletal: No lower extremity tenderness nor edema. Neurologic:  Normal speech and language. No gross focal neurologic deficits are appreciated. Skin:  Skin is warm, dry and intact. No rash noted. Psychiatric: Mood and affect are normal. Speech and behavior are normal.  ____________________________________________   LABS (all labs ordered are listed, but only abnormal results are displayed)  Labs Reviewed  CBC - Abnormal; Notable for the following components:      Result Value   RBC 3.09 (*)  Hemoglobin 10.0 (*)    HCT 29.5 (*)    All other components within normal limits  BASIC METABOLIC PANEL - Abnormal; Notable for the following components:   Potassium 6.1 (*)    CO2 16 (*)    Glucose, Bld 110 (*)    BUN 78 (*)    Creatinine, Ser 4.86 (*)    GFR, Estimated 11 (*)    All other components within normal limits  SARS CORONAVIRUS 2 (TAT 6-24 HRS)  URINALYSIS, COMPLETE (UACMP) WITH MICROSCOPIC   ____________________________________________  EKG  ED ECG REPORT I, Blake Divine, the attending physician, personally viewed and interpreted this ECG.   Date: 11/03/2020  EKG Time: 14:18  Rate: 92  Rhythm: normal sinus rhythm  Axis: Normal  Intervals:none  ST&T Change: None   PROCEDURES  Procedure(s) performed (including Critical Care):  .Critical Care Performed by: Blake Divine, MD Authorized by: Blake Divine, MD   Critical care provider statement:    Critical care time (minutes):  45   Critical care time was exclusive of:  Separately billable procedures and treating other patients and teaching time   Critical care was necessary to treat or prevent  imminent or life-threatening deterioration of the following conditions:  Metabolic crisis and renal failure   Critical care was time spent personally by me on the following activities:  Discussions with consultants, evaluation of patient's response to treatment, examination of patient, ordering and performing treatments and interventions, ordering and review of laboratory studies, ordering and review of radiographic studies, pulse oximetry, re-evaluation of patient's condition, obtaining history from patient or surrogate and review of old charts   I assumed direction of critical care for this patient from another provider in my specialty: no     Care discussed with: admitting provider       ____________________________________________   INITIAL IMPRESSION / [place] / ED COURSE       80 year old male with past medical history of hypertension, hyperlipidemia, COPD, CAD, and gout who presents to the ED with abnormal labs significant for hyperkalemia at his PCPs office yesterday.  Labs today again show AKI with hyperkalemia and potassium of 6.1.  EKG shows no acute changes related to hyperkalemia.  We will hydrate with IV fluids and check renal ultrasound for evidence of obstruction.  We will also give dose of Lokelma but hold off on calcium given lack of EKG changes.  Plan to discuss with hospitalist for admission.      ____________________________________________   FINAL CLINICAL IMPRESSION(S) / ED DIAGNOSES  Final diagnoses:  AKI (acute kidney injury) ([place])  Hyperkalemia     ED Discharge Orders    None       Note:  This document was prepared using Dragon voice recognition software and may include unintentional dictation errors.   Blake Divine, MD 11/03/20 [id number]

## 2020-11-03 NOTE — ED Notes (Signed)
Pt sent to er for eval of high potassium.  No chest pain or sob   No cough.  nsr on monitor. Iv started  Family with pt

## 2020-11-03 NOTE — ED Triage Notes (Signed)
Sent to ED for potassium over 6 on yesterday's blood draw. Pt reports elevated BUN and creat as well. Feeling anxious about being sent to ED, otherwise normal. Pt alert and oriented X4, cooperative, RR even and unlabored, color WNL. Pt in NAD.

## 2020-11-03 NOTE — H&P (Signed)
History and Physical  Tony Mitchell ZDG:[id number] DOB: October 04, 1940 DOA: 11/03/2020  Referring physician: Dr. Charna Archer, [place]  PCP: Tracie Harrier, MD  Outpatient Specialists: Nephrology. Patient coming from: Home.  Chief Complaint: Referral from nephrology's office due to AKI and hyperkalemia.  HPI: Tony Mitchell is a 80 y.o. male with medical history significant for CKD 3B, essential hypertension, type 2 diabetes, hyperlipidemia, gout, thoracic aortic aneurysm, OSA on CPAP, GERD, who presented to [facility] ED at the request of his nephrologist due to AKI and hyperkalemia.  Patient went to his nephrologist the day prior to this presentation and had routine blood work done.  This morning he received a call advising him to present to the ED due to elevated creatinine and elevated potassium level.  Upon arrival to the ED, BMP revealed creatinine up to 4.86 with baseline of 2, serum potassium 6.1.  Received IV fluid and Lokelma.  Renal ultrasound ordered by EDP, pending.  Urine analysis also pending.  Patient denies any nausea or abdominal pain.  Denies any uremic symptoms.  States he was doing fine with no new symptoms.  ED Course:  Afebrile, BP 147/70, pulse 92, respiratory rate 17, O2 saturation 100% on room air.  Lab studies remarkable for creatinine 4.86 with baseline of 2, BUN 78, serum bicarb 16 with anion gap of 10, serum potassium 6.1, hemoglobin 10.0k with baseline of 11k.  Renal ultrasound ordered and pending.  Review of Systems: Review of systems as noted in the HPI. All other systems reviewed and are negative.   Past Medical History:  Diagnosis Date  . Acute appendicitis 03/03/2018  . Arthritis   . COPD (chronic obstructive pulmonary disease) ([place])   . Coronary artery disease   . Difficult intubation   . GERD (gastroesophageal reflux disease)   . Gout   . Hyperlipidemia   . Hypertension   . Sleep apnea    Past Surgical History:  Procedure Laterality Date  . CATARACT  EXTRACTION  2012  . KNEE SURGERY Left 1965  . LAPAROSCOPIC APPENDECTOMY N/A 03/03/2018   Procedure: APPENDECTOMY LAPAROSCOPIC;  Surgeon: Vickie Epley, MD;  Location: [facility] ORS;  Service: General;  Laterality: N/A;  . TOTAL KNEE ARTHROPLASTY Left 09/16/2018   Procedure: TOTAL KNEE ARTHROPLASTY;  Surgeon: Corky Mull, MD;  Location: [facility] ORS;  Service: Orthopedics;  Laterality: Left;    Social History:  reports that he quit smoking about 24 years ago. His smoking use included cigarettes. He has never used smokeless tobacco. He reports current alcohol use. He reports that he does not use drugs.   No Known Allergies  Family History  Problem Relation Age of Onset  . Cancer Father        Unknown  . Prostate cancer Neg Hx   . Lung cancer Neg Hx       Prior to Admission medications   Medication Sig Start Date End Date Taking? Authorizing Provider  albuterol (PROVENTIL HFA;VENTOLIN HFA) 108 (90 Base) MCG/ACT inhaler Inhale 2 puffs into the lungs every 6 (six) hours as needed for wheezing or shortness of breath. 08/08/17   Arta Silence, MD  allopurinol (ZYLOPRIM) 100 MG tablet Take 100 mg by mouth daily.  06/17/17   [provider]  amLODipine (NORVASC) 2.5 MG tablet Take 2.5 mg by mouth daily. 04/08/20   [provider]  aspirin EC 81 MG tablet Take 81 mg by mouth daily.    [provider]  cloNIDine (CATAPRES) 0.3 MG tablet Take 1 tablet by  mouth in the morning and at bedtime.    [provider]  fenofibrate (TRICOR) 145 MG tablet Take 145 mg by mouth daily. 02/25/20   [provider]  gabapentin (NEURONTIN) 300 MG capsule Take 300 mg by mouth 3 (three) times daily.    [provider]  hydrALAZINE (APRESOLINE) 25 MG tablet Take 25 mg by mouth 2 (two) times daily.    [provider]  labetalol (NORMODYNE) 300 MG tablet Take 1 tablet by mouth daily.    [provider]  lansoprazole (PREVACID) 15 MG capsule Take 15  mg by mouth every evening.    [provider]  lisinopril (ZESTRIL) 30 MG tablet Take 30 mg by mouth daily. 03/18/20   [provider]  MEGARED OMEGA-3 KRILL OIL 500 MG CAPS Take 1,000 mg by mouth daily.    [provider]  metFORMIN (GLUCOPHAGE) 500 MG tablet Take 500 mg by mouth daily. 02/17/20   [provider]  simvastatin (ZOCOR) 20 MG tablet Take 20 mg by mouth at bedtime.  04/01/18   [provider]  SYMBICORT 80-4.5 MCG/ACT inhaler Inhale 2 puffs into the lungs 2 (two) times daily. 09/11/17   [provider]  vitamin B-12 (CYANOCOBALAMIN) 1000 MCG tablet Take 1,000 mcg by mouth daily.    [provider]    Physical Exam: BP (!) 159/88   Pulse 91   Temp 98.2 F (36.8 C) (Oral)   Resp 17   Ht 5\' 9"  (1.753 m)   Wt 89.8 kg   SpO2 99%   BMI 29.24 kg/m   . General: 80 y.o. year-old male well developed well nourished in no acute distress.  Alert and oriented x3. . Cardiovascular: Regular rate and rhythm with no rubs or gallops.  No thyromegaly or JVD noted.  Trace lower extremity edema. 2/4 pulses in all 4 extremities. Marland Kitchen Respiratory: Clear to auscultation with no wheezes or rales. Good inspiratory effort. . Abdomen: Soft nontender nondistended with normal bowel sounds x4 quadrants. . Muskuloskeletal: No cyanosis or clubbing.  Trace lower extremity edema bilaterally.   . Neuro: CN II-XII intact, strength, sensation, reflexes . Skin: No ulcerative lesions noted or rashes . Psychiatry: Judgement and insight appear normal. Mood is appropriate for condition and setting          Labs on Admission:  Basic Metabolic Panel: Recent Labs  Lab 11/03/20 1413  NA 136  K 6.1*  CL 110  CO2 16*  GLUCOSE 110*  BUN 78*  CREATININE 4.86*  CALCIUM 9.0   Liver Function Tests: No results for input(s): AST, ALT, ALKPHOS, BILITOT, PROT, ALBUMIN in the last 168 hours. No results for input(s): LIPASE, AMYLASE in the last 168 hours. No  results for input(s): AMMONIA in the last 168 hours. CBC: Recent Labs  Lab 11/03/20 1413  WBC 8.2  HGB 10.0*  HCT 29.5*  MCV 95.5  PLT 255   Cardiac Enzymes: No results for input(s): CKTOTAL, CKMB, CKMBINDEX, TROPONINI in the last 168 hours.  BNP (last 3 results) No results for input(s): BNP in the last 8760 hours.  ProBNP (last 3 results) No results for input(s): PROBNP in the last 8760 hours.  CBG: No results for input(s): GLUCAP in the last 168 hours.  Radiological Exams on Admission: No results found.  EKG: I independently viewed the EKG done and my findings are as followed: None available at the time of this visit.  Assessment/Plan Present on Admission: . AKI (acute kidney injury) ([place])  Active Problems:   AKI (acute kidney injury) ([place])  AKI on CKD 3B suspect contributed by ACEi, PPI Baseline creatinine appears to be 2 with GFR 30s Presented with creatinine of greater than 4 with GFR of 11. Advised to come to the ED by his nephrologist Start gentle IV fluid hydration at 50 cc/h for 2 days. Renal ultrasound has been ordered by EDP and is pending. Closely monitor urine output Daily renal panel.  Hyperkalemia likely secondary to acute renal failure. Presented with potassium of 6.1 Treated with gentle IV fluid and Lokelma Repeat serum potassium tonight at 1900.  Non-anion gap metabolic acidosis secondary to acute renal failure. Presented with serum bicarb 16, anion gap of 10. We will start gentle IV fluid hydration with isotonic bicarb at 50 cc/h x 2 days. Repeat BMP in the morning.  Essential hypertension BP stable Hold off home lisinopril Resume other home oral antihypertensives Closely monitor vital signs.  Type 2 diabetes with renal insufficiency Obtain hemoglobin A1c Hold off home metformin, contraindicated with GFR 11. Insulin sliding scale  Hyperlipidemia Resume home regimen.     DVT prophylaxis: Subcu heparin 3 times daily  Code  Status: Full code per the patient himself.  Family Communication: Wife at his bedside.  Disposition Plan: Admit to MedSurg unit with remote telemetry.  Consults called: Please consult nephrology in the morning.  Admission status: Inpatient status.  Patient will require at least 2 midnights for further evaluation and treatment of present condition.   Status is: Inpatient    Dispo:  Patient From: Home  Planned Disposition: Home  Anticipated discharge date 11/05/2020.  Medically stable for discharge: No, ongoing management of AKI and hyperkalemia.         Kayleen Memos MD [facility] Pager [phone]  If 7PM-7AM, please contact night-coverage [email or web address] Password [facility]  11/03/2020, 4:01 PM

## 2020-11-04 ENCOUNTER — Encounter: Payer: Self-pay | Admitting: Internal Medicine

## 2020-11-04 DIAGNOSIS — D638 Anemia in other chronic diseases classified elsewhere: Secondary | ICD-10-CM

## 2020-11-04 DIAGNOSIS — E875 Hyperkalemia: Secondary | ICD-10-CM

## 2020-11-04 DIAGNOSIS — D649 Anemia, unspecified: Secondary | ICD-10-CM

## 2020-11-04 DIAGNOSIS — R197 Diarrhea, unspecified: Secondary | ICD-10-CM

## 2020-11-04 LAB — CBC
HCT: 26.7 % — ABNORMAL LOW (ref 39.0–52.0)
Hemoglobin: 9.2 g/dL — ABNORMAL LOW (ref 13.0–17.0)
MCH: 32.9 pg (ref 26.0–34.0)
MCHC: 34.5 g/dL (ref 30.0–36.0)
MCV: 95.4 fL (ref 80.0–100.0)
Platelets: 232 10*3/uL (ref 150–400)
RBC: 2.8 MIL/uL — ABNORMAL LOW (ref 4.22–5.81)
RDW: 13.1 % (ref 11.5–15.5)
WBC: 6.1 10*3/uL (ref 4.0–10.5)
nRBC: 0 % (ref 0.0–0.2)

## 2020-11-04 LAB — COMPREHENSIVE METABOLIC PANEL
ALT: 29 U/L (ref 0–44)
AST: 30 U/L (ref 15–41)
Albumin: 3.9 g/dL (ref 3.5–5.0)
Alkaline Phosphatase: 35 U/L — ABNORMAL LOW (ref 38–126)
Anion gap: 8 (ref 5–15)
BUN: 69 mg/dL — ABNORMAL HIGH (ref 8–23)
CO2: 19 mmol/L — ABNORMAL LOW (ref 22–32)
Calcium: 9.2 mg/dL (ref 8.9–10.3)
Chloride: 113 mmol/L — ABNORMAL HIGH (ref 98–111)
Creatinine, Ser: 3.96 mg/dL — ABNORMAL HIGH (ref 0.61–1.24)
GFR, Estimated: 15 mL/min — ABNORMAL LOW (ref >60)
Glucose, Bld: 89 mg/dL (ref 70–99)
Potassium: 5.2 mmol/L — ABNORMAL HIGH (ref 3.5–5.1)
Sodium: 140 mmol/L (ref 135–145)
Total Bilirubin: 0.7 mg/dL (ref 0.3–1.2)
Total Protein: 6.7 g/dL (ref 6.5–8.1)

## 2020-11-04 LAB — GASTROINTESTINAL PANEL BY PCR, STOOL (REPLACES STOOL CULTURE)

## 2020-11-04 LAB — SARS CORONAVIRUS 2 (TAT 6-24 HRS): SARS Coronavirus 2: NEGATIVE

## 2020-11-04 LAB — C DIFFICILE QUICK SCREEN W PCR REFLEX
C Diff antigen: NEGATIVE
C Diff interpretation: NOT DETECTED
C Diff toxin: NEGATIVE

## 2020-11-04 MED ORDER — SODIUM ZIRCONIUM CYCLOSILICATE 10 G PO PACK
10.0000 g | PACK | Freq: Every day | ORAL | Status: DC
  Administered 2020-11-04: 10 g via ORAL
  Filled 2020-11-04 (×2): qty 1

## 2020-11-04 NOTE — Progress Notes (Addendum)
Patient ID: Tony Mitchell, male   DOB: 23-Nov-1940, 80 y.o.   MRN: [id number] [facility] PROGRESS NOTE  Tony Mitchell MWN:[id number] DOB: April 27, 1941 DOA: 11/03/2020 PCP: Tracie Harrier, MD  HPI/Subjective: Patient feels well offers no complaints.  States he has had diarrhea since being diagnosed with Covid.  No nausea vomiting or abdominal pain.  Urinating well.  Mentions that he did take ibuprofen for about a month and a half for knee pain.  Sent in for worsening creatinine.  Objective: Vitals:   11/04/20 0742 11/04/20 1157  BP: (!) 151/77 (!) 147/76  Pulse: 82 91  Resp: 15 17  Temp: 98.1 F (36.7 C) 97.8 F (36.6 C)  SpO2: 99% 98%    Intake/Output Summary (Last 24 hours) at 11/04/2020 1519 Last data filed at 11/04/2020 1200 Gross per 24 hour  Intake 1051.77 ml  Output 2030 ml  Net -978.23 ml   Filed Weights   11/03/20 1411 11/04/20 0500  Weight: 89.8 kg 89.9 kg    ROS: Review of Systems  Respiratory: Negative for shortness of breath.   Cardiovascular: Negative for chest pain.  Gastrointestinal: Positive for diarrhea. Negative for abdominal pain and vomiting.   Exam: Physical Exam HENT:     Head: Normocephalic.     Mouth/Throat:     Pharynx: No oropharyngeal exudate.  Eyes:     General: Lids are normal.     Conjunctiva/sclera: Conjunctivae normal.     Pupils: Pupils are equal, round, and reactive to light.  Cardiovascular:     Rate and Rhythm: Normal rate and regular rhythm.     Heart sounds: Normal heart sounds, S1 normal and S2 normal.  Pulmonary:     Breath sounds: Normal breath sounds. No decreased breath sounds, wheezing, rhonchi or rales.  Abdominal:     Palpations: Abdomen is soft.     Tenderness: There is no abdominal tenderness.  Musculoskeletal:     Right lower leg: No swelling.     Left lower leg: No swelling.  Skin:    General: Skin is warm.     Findings: No rash.  Neurological:     Mental Status: He is alert and oriented to  person, place, and time.       Data Reviewed: Basic Metabolic Panel: Recent Labs  Lab 11/03/20 1413 11/03/20 2137 11/04/20 0418  NA 136 137 140  K 6.1* 4.6 5.2*  CL 110 112* 113*  CO2 16* 16* 19*  GLUCOSE 110* 119* 89  BUN 78* 71* 69*  CREATININE 4.86* 4.13* 3.96*  CALCIUM 9.0 8.7* 9.2   Liver Function Tests: Recent Labs  Lab 11/04/20 0418  AST 30  ALT 29  ALKPHOS 35*  BILITOT 0.7  PROT 6.7  ALBUMIN 3.9   CBC: Recent Labs  Lab 11/03/20 1413 11/04/20 0418  WBC 8.2 6.1  HGB 10.0* 9.2*  HCT 29.5* 26.7*  MCV 95.5 95.4  PLT 255 232     Recent Results (from the past 240 hour(s))  SARS CORONAVIRUS 2 (TAT 6-24 HRS) Nasopharyngeal Nasopharyngeal Swab     Status: None   Collection Time: 11/03/20  3:27 PM   Specimen: Nasopharyngeal Swab  Result Value Ref Range Status   SARS Coronavirus 2 NEGATIVE NEGATIVE Final    Comment: (NOTE) SARS-CoV-2 target nucleic acids are NOT DETECTED.  The SARS-CoV-2 RNA is generally detectable in upper and lower respiratory specimens during the acute phase of infection. Negative results do not preclude SARS-CoV-2 infection, do not rule out co-infections with other  pathogens, and should not be used as the sole basis for treatment or other patient management decisions. Negative results must be combined with clinical observations, patient history, and epidemiological information. The expected result is Negative.  Fact Sheet for Patients: SugarRoll.be  Fact Sheet for Healthcare Providers: https://www.woods-mathews.com/  This test is not yet approved or cleared by the [place] FDA and  has been authorized for detection and/or diagnosis of SARS-CoV-2 by FDA under an Emergency Use Authorization (EUA). This EUA will remain  in effect (meaning this test can be used) for the duration of the COVID-19 declaration under Se ction 564(b)(1) of the Act, 21 U.S.C. section 360bbb-3(b)(1), unless  the authorization is terminated or revoked sooner.  Performed at [facility], [place] [address], [place], [place] [address]      Studies: US Renal  Result Date: 11/03/2020 CLINICAL DATA:  Acute kidney injury EXAM: RENAL / URINARY TRACT ULTRASOUND COMPLETE COMPARISON:  October 12, 2020 FINDINGS: Right Kidney: Renal measurements: 10.1 x 4.7 x 5.2 cm = volume: 129 mL. Echogenicity is increased. Renal cortical thickness is mildly diminished. No mass, perinephric fluid, or hydronephrosis visualized. No sonographically demonstrable calculus or ureterectasis. Left Kidney: Renal measurements: 11.0 x 5.5 x 4.6 cm = volume: 145 mL. Echogenicity is increased. There is a degree of renal cortical thinning. No mass, perinephric fluid, or hydronephrosis visualized. No sonographically demonstrable calculus or ureterectasis. Bladder: Appears normal for degree of bladder distention. Other: None. IMPRESSION: Increased renal echogenicity and renal cortical thinning, findings indicative of medical renal disease. No obstructing focus in either kidney. Electronically Signed   By: Lowella Grip III M.D.   On: 11/03/2020 16:32    Scheduled Meds: . amLODipine  2.5 mg Oral Daily  . aspirin EC  81 mg Oral Daily  . fenofibrate  160 mg Oral Daily  . heparin injection (subcutaneous)  5,000 Units Subcutaneous Q8H  . hydrALAZINE  25 mg Oral BID  . mometasone-formoterol  2 puff Inhalation BID  . simvastatin  20 mg Oral QHS  . sodium zirconium cyclosilicate  10 g Oral Daily  . vitamin B-12  1,000 mcg Oral Daily   Continuous Infusions: .  sodium bicarbonate (isotonic) infusion in sterile water 50 mL/hr at 11/04/20 3382    Assessment/Plan:  1. Acute kidney injury with a baseline creatinine in 2020 of 0.89.  Creatinine worsened as outpatient and nephrology sent in for further evaluation.  On presentation creatinine 4.86 and now down to 3.96 with IV fluid hydration.  Acute kidney injury likely multifactorial in  nature.  Did have a month and a half where he was taking ibuprofen.  Also on lisinopril Glucophage and had some periods of low blood pressure as outpatient.  Renal ultrasound shows medical renal disease and no obstruction.  Check creatinine daily.  On bicarb drip. 2. Hyperkalemia will dose Lokelma daily 3. Diarrhea since diagnosed with COVID.  Send off stool studies including ova and parasites, C. difficile and comprehensive panel. 4. Anemia.  Send off iron studies 5. Essential hypertension on amlodipine and hydralazine 6. 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 Type 2 diabetes mellitus with hyperlipidemia unspecified on Zocor.  Stop Glucophage with worsening creatinine. 8. COPD on Dulera inhaler        Code Status:     Code Status Orders  (From admission, onward)         Start     Ordered   11/03/20 1523  Full code  Continuous        11/03/20 1523  Code Status History    Date Active Date Inactive Code Status Order ID Comments User Context   09/16/2018 1050 09/18/2018 1501 Full Code [id number]  Corky Mull, MD Inpatient   03/03/2018 2249 03/04/2018 1426 Full Code [id number]  Vickie Epley, MD Inpatient   09/18/2017 1511 09/21/2017 1439 Full Code [id number]  Jules Husbands, MD Inpatient   06/03/2016 0208 06/05/2016 1621 Full Code [id number]  Olean Ree, MD ED   Advance Care Planning Activity     Family Communication: Spoke with wife on the phone Disposition Plan: Status is: Inpatient  Dispo:  Patient From: Home  Planned Disposition: Home  Medically stable for discharge: No    Consultants:  Nephrology  Time spent: 28 minutes  [place]

## 2020-11-04 NOTE — Plan of Care (Signed)

## 2020-11-04 NOTE — Progress Notes (Signed)
Tony Mitchell, [place] 11/04/20  Subjective:   Hospital day # 1  Patient known to our practice from outpatient follow-up He is admitted for evaluation of acute kidney injury Patient is otherwise asymptomatic No leg edema No shortness of breath   Objective:  Vital signs in last 24 hours:  Temp:  [97.8 F (36.6 C)-98.2 F (36.8 C)] 97.8 F (36.6 C) (03/18 1157) Pulse Rate:  [82-109] 91 (03/18 1157) Resp:  [15-22] 17 (03/18 1157) BP: (131-159)/(67-88) 147/76 (03/18 1157) SpO2:  [93 %-100 %] 98 % (03/18 1157) Weight:  [89.8 kg-89.9 kg] 89.9 kg (03/18 0500)  Weight change:  Filed Weights   11/03/20 1411 11/04/20 0500  Weight: 89.8 kg 89.9 kg    Intake/Output:    Intake/Output Summary (Last 24 hours) at 11/04/2020 1324 Last data filed at 11/04/2020 1200 Gross per 24 hour  Intake 1051.77 ml  Output 2030 ml  Net -978.23 ml    Physical Exam: General:  No acute distress, laying in the bed  HEENT  anicteric, moist oral mucous membrane  Pulm/lungs  normal breathing effort, lungs are clear to auscultation  CVS/Heart  regular rhythm, no rub or gallop  Abdomen:   Soft, nontender  Extremities:  No peripheral edema  Neurologic:  Alert, oriented, able to follow commands  Skin:  No acute rashes    Basic Metabolic Panel:  Recent Labs  Lab 11/03/20 1413 11/03/20 2137 11/04/20 0418  NA 136 137 140  K 6.1* 4.6 5.2*  CL 110 112* 113*  CO2 16* 16* 19*  GLUCOSE 110* 119* 89  BUN 78* 71* 69*  CREATININE 4.86* 4.13* 3.96*  CALCIUM 9.0 8.7* 9.2     CBC: Recent Labs  Lab 11/03/20 1413 11/04/20 0418  WBC 8.2 6.1  HGB 10.0* 9.2*  HCT 29.5* 26.7*  MCV 95.5 95.4  PLT 255 232     No results found for: HEPBSAG, HEPBSAB, HEPBIGM    Microbiology:  Recent Results (from the past 240 hour(s))  SARS CORONAVIRUS 2 (TAT 6-24 HRS) Nasopharyngeal Nasopharyngeal Swab     Status: None   Collection Time: 11/03/20  3:27 PM   Specimen: Nasopharyngeal  Swab  Result Value Ref Range Status   SARS Coronavirus 2 NEGATIVE NEGATIVE Final    Comment: (NOTE) SARS-CoV-2 target nucleic acids are NOT DETECTED.  The SARS-CoV-2 RNA is generally detectable in upper and lower respiratory specimens during the acute phase of infection. Negative results do not preclude SARS-CoV-2 infection, do not rule out co-infections with other pathogens, and should not be used as the sole basis for treatment or other patient management decisions. Negative results must be combined with clinical observations, patient history, and epidemiological information. The expected result is Negative.  Fact Sheet for Patients: SugarRoll.be  Fact Sheet for Healthcare Providers: https://www.woods-mathews.com/  This test is not yet approved or cleared by the [place] FDA and  has been authorized for detection and/or diagnosis of SARS-CoV-2 by FDA under an Emergency Use Authorization (EUA). This EUA will remain  in effect (meaning this test can be used) for the duration of the COVID-19 declaration under Se ction 564(b)(1) of the Act, 21 U.S.C. section 360bbb-3(b)(1), unless the authorization is terminated or revoked sooner.  Performed at [facility], [place] [address], [place], [place] [address]     Coagulation Studies: No results for input(s): LABPROT, INR in the last 72 hours.  Urinalysis: Recent Labs    11/03/20 [place] 1.008  PHURINE  5.0  GLUCOSEU 150*  HGBUR SMALL*  BILIRUBINUR NEGATIVE  KETONESUR NEGATIVE  PROTEINUR NEGATIVE  NITRITE NEGATIVE  LEUKOCYTESUR NEGATIVE      Imaging: US Renal  Result Date: 11/03/2020 CLINICAL DATA:  Acute kidney injury EXAM: RENAL / URINARY TRACT ULTRASOUND COMPLETE COMPARISON:  October 12, 2020 FINDINGS: Right Kidney: Renal measurements: 10.1 x 4.7 x 5.2 cm = volume: 129 mL. Echogenicity is increased. Renal cortical thickness is mildly  diminished. No mass, perinephric fluid, or hydronephrosis visualized. No sonographically demonstrable calculus or ureterectasis. Left Kidney: Renal measurements: 11.0 x 5.5 x 4.6 cm = volume: 145 mL. Echogenicity is increased. There is a degree of renal cortical thinning. No mass, perinephric fluid, or hydronephrosis visualized. No sonographically demonstrable calculus or ureterectasis. Bladder: Appears normal for degree of bladder distention. Other: None. IMPRESSION: Increased renal echogenicity and renal cortical thinning, findings indicative of medical renal disease. No obstructing focus in either kidney. Electronically Signed   By: Lowella Grip III M.D.   On: 11/03/2020 16:32     Medications:   .  sodium bicarbonate (isotonic) infusion in sterile water 50 mL/hr at 11/04/20 9381   . amLODipine  2.5 mg Oral Daily  . aspirin EC  81 mg Oral Daily  . fenofibrate  160 mg Oral Daily  . heparin injection (subcutaneous)  5,000 Units Subcutaneous Q8H  . hydrALAZINE  25 mg Oral BID  . mometasone-formoterol  2 puff Inhalation BID  . simvastatin  20 mg Oral QHS  . sodium zirconium cyclosilicate  10 g Oral Daily  . vitamin B-12  1,000 mcg Oral Daily   albuterol  Assessment/ Plan:  80 y.o. male with chronic kidney disease, hypertension, type 2 diabetes, hyperlipidemia, gout, thoracic aortic aneurysm, obstructive sleep apnea treated with CPAP, GERD, arthritis  admitted on 11/03/2020 for Hyperkalemia [E87.5] AKI (acute kidney injury) ([place]) [N17.9]  #Acute kidney injury-Baseline creatinine 0.89/GFR greater than 60 from September 18, 2018 #Chronic kidney disease unspecified-cortical thinning renal ultrasound #Hyperkalemia #Anemia #Diabetes type 2 with CKD-hemoglobin A1c 5.8% on November 02, 2020 #Hyperlipidemia.  August 31, 2020 total cholesterol 214, triglycerides 678, HDL 45, #Elevated PSA.  August 31, 2020 level of 11.8  Pertinent labs: Urinalysis November 03, 2020: Glucosuria, negative for  protein, negative for blood, 0-5 RBCs.  Urine protein to creatinine ratio 0.5 g November 02, 2020 Imaging: Renal ultrasound November 03, 2020: Increased renal echogenicity and renal cortical thinning.  Findings indicated of medical renal disease.  No obstructing focus in either kidney. Autoimmune labs: October 05, 2020: ANA negative, SPEP negative for M spike, TSH normal January 2022  Patient does relate significant history of nonsteroidal use in June 2021 when he was treated with ibuprofen for about a month or so.  Patient has taken Advil off and on.  No history of IV contrast exposure recently.  He did have Covid infection in January 2022 and was symptomatic in the form of diarrhea.  No fevers chills or change of taste.  Did not need to be hospitalized.  Still experiencing loose stools off and on.  AKI is most likely multifactorial including ATN from low blood pressure in setting of ACE inhibitor and volume depletion from diarrhea. Also possibility of interstitial disease from excessive use of nonsteroidals.  Differential includes Covid related kidney disease.  Plan: Agree with holding lisinopril Agree with IV hydration with bicarbonate to treat acidosis May need few doses of Lokelma if potassium remains elevated We will order complements, ANCA studies Discussed possibility of renal biopsy early next week  Further plan as hospital course progresses   LOS: [place] 3/18/20221:24 PM  [place], [place]  Note: This note was prepared with Dragon dictation. Any transcription errors are unintentional

## 2020-11-05 DIAGNOSIS — E1122 Type 2 diabetes mellitus with diabetic chronic kidney disease: Secondary | ICD-10-CM

## 2020-11-05 DIAGNOSIS — A040 Enteropathogenic Escherichia coli infection: Secondary | ICD-10-CM

## 2020-11-05 DIAGNOSIS — E1169 Type 2 diabetes mellitus with other specified complication: Secondary | ICD-10-CM

## 2020-11-05 LAB — IRON AND TIBC
Iron: 78 ug/dL (ref 45–182)
Saturation Ratios: 17 % — ABNORMAL LOW (ref 17.9–39.5)
TIBC: 448 ug/dL (ref 250–450)
UIBC: 370 ug/dL

## 2020-11-05 LAB — BASIC METABOLIC PANEL
Anion gap: 9 (ref 5–15)
BUN: 53 mg/dL — ABNORMAL HIGH (ref 8–23)
CO2: 24 mmol/L (ref 22–32)
Calcium: 9 mg/dL (ref 8.9–10.3)
Chloride: 107 mmol/L (ref 98–111)
Creatinine, Ser: 3.04 mg/dL — ABNORMAL HIGH (ref 0.61–1.24)
GFR, Estimated: 20 mL/min — ABNORMAL LOW (ref >60)
Glucose, Bld: 100 mg/dL — ABNORMAL HIGH (ref 70–99)
Potassium: 4.1 mmol/L (ref 3.5–5.1)
Sodium: 140 mmol/L (ref 135–145)

## 2020-11-05 LAB — CBC
HCT: 26.1 % — ABNORMAL LOW (ref 39.0–52.0)
Hemoglobin: 8.8 g/dL — ABNORMAL LOW (ref 13.0–17.0)
MCH: 32.1 pg (ref 26.0–34.0)
MCHC: 33.7 g/dL (ref 30.0–36.0)
MCV: 95.3 fL (ref 80.0–100.0)
Platelets: 227 10*3/uL (ref 150–400)
RBC: 2.74 MIL/uL — ABNORMAL LOW (ref 4.22–5.81)
RDW: 12.8 % (ref 11.5–15.5)
WBC: 5.5 10*3/uL (ref 4.0–10.5)
nRBC: 0 % (ref 0.0–0.2)

## 2020-11-05 LAB — VITAMIN B12: Vitamin B-12: 503 pg/mL (ref 180–914)

## 2020-11-05 LAB — FERRITIN: Ferritin: 665 ng/mL — ABNORMAL HIGH (ref 24–336)

## 2020-11-05 MED ORDER — STERILE WATER FOR INJECTION IV SOLN
INTRAVENOUS | Status: DC
  Filled 2020-11-05 (×2): qty 850

## 2020-11-05 NOTE — Progress Notes (Signed)
[place], [place] 11/05/20  Subjective:   Hospital day # 2  Patient requesting discharge home today,denies any discomfort, no nausea,vomiting, SOB. He reports his diarrhea is better. He is getting IVF with Sodium bicarbonate 50 ml/hr. Renal function is gradually improving. Dr.Lateef discussed with the patient staying one more day to continue IV fluids and get  renal function better. Patient in agreement.  Objective:  Vital signs in last 24 hours:  Temp:  [97.8 F (36.6 C)-98.2 F (36.8 C)] 98 F (36.7 C) (03/19 1104) Pulse Rate:  [81-97] 97 (03/19 1104) Resp:  [15-19] 19 (03/19 1104) BP: (126-152)/(75-86) 152/85 (03/19 1104) SpO2:  [96 %-99 %] 97 % (03/19 1104)  Weight change:  Filed Weights   11/03/20 1411 11/04/20 0500  Weight: 89.8 kg 89.9 kg    Intake/Output:    Intake/Output Summary (Last 24 hours) at 11/05/2020 1430 Last data filed at 11/05/2020 1300 Gross per 24 hour  Intake 799 ml  Output 2125 ml  Net -1326 ml    Physical Exam: General:  Awake, alert, in no acute distress  HEENT  Normocephalic,Atraumatic, oral mucous membranes moist  Pulm/lungs Respirations symmetrical,unlabored,lungs clear  CVS/Heart  S1S2, no rub or gallops  Abdomen:   Soft, non tender,non distended  Extremities:  No peripheral edema  Neurologic:  Oriented x 3  Skin:  No acute rashes or lesions    Basic Metabolic Panel:  Recent Labs  Lab 11/03/20 1413 11/03/20 2137 11/04/20 0418 11/05/20 0357  NA 136 137 140 140  K 6.1* 4.6 5.2* 4.1  CL 110 112* 113* 107  CO2 16* 16* 19* 24  GLUCOSE 110* 119* 89 100*  BUN 78* 71* 69* 53*  CREATININE 4.86* 4.13* 3.96* 3.04*  CALCIUM 9.0 8.7* 9.2 9.0     CBC: Recent Labs  Lab 11/03/20 1413 11/04/20 0418 11/05/20 0357  WBC 8.2 6.1 5.5  HGB 10.0* 9.2* 8.8*  HCT 29.5* 26.7* 26.1*  MCV 95.5 95.4 95.3  PLT 255 232 227     No results found for: HEPBSAG, HEPBSAB, HEPBIGM    Microbiology:  Recent  Results (from the past 240 hour(s))  SARS CORONAVIRUS 2 (TAT 6-24 HRS) Nasopharyngeal Nasopharyngeal Swab     Status: None   Collection Time: 11/03/20  3:27 PM   Specimen: Nasopharyngeal Swab  Result Value Ref Range Status   SARS Coronavirus 2 NEGATIVE NEGATIVE Final    Comment: (NOTE) SARS-CoV-2 target nucleic acids are NOT DETECTED.  The SARS-CoV-2 RNA is generally detectable in upper and lower respiratory specimens during the acute phase of infection. Negative results do not preclude SARS-CoV-2 infection, do not rule out co-infections with other pathogens, and should not be used as the sole basis for treatment or other patient management decisions. Negative results must be combined with clinical observations, patient history, and epidemiological information. The expected result is Negative.  Fact Sheet for Patients: SugarRoll.be  Fact Sheet for Healthcare Providers: https://www.woods-mathews.com/  This test is not yet approved or cleared by the [place] FDA and  has been authorized for detection and/or diagnosis of SARS-CoV-2 by FDA under an Emergency Use Authorization (EUA). This EUA will remain  in effect (meaning this test can be used) for the duration of the COVID-19 declaration under Se ction 564(b)(1) of the Act, 21 U.S.C. section 360bbb-3(b)(1), unless the authorization is terminated or revoked sooner.  Performed at [facility], [place] [address], [place], [place] [address]   Gastrointestinal Panel by PCR , Stool  Status: Abnormal   Collection Time: 11/04/20  5:15 PM   Specimen: Stool  Result Value Ref Range Status   Campylobacter species NOT DETECTED NOT DETECTED Final   Plesimonas shigelloides NOT DETECTED NOT DETECTED Final   Salmonella species NOT DETECTED NOT DETECTED Final   Yersinia enterocolitica NOT DETECTED NOT DETECTED Final   Vibrio species NOT DETECTED NOT DETECTED Final   Vibrio cholerae NOT  DETECTED NOT DETECTED Final   Enteroaggregative E coli (EAEC) NOT DETECTED NOT DETECTED Final   Enteropathogenic E coli (EPEC) DETECTED (A) NOT DETECTED Final    Comment: RESULT CALLED TO, READ BACK BY AND VERIFIED WITH: TANIA AUGUSTINE AT 2040 ON 11/04/20 BY SS    Enterotoxigenic E coli (ETEC) NOT DETECTED NOT DETECTED Final   Shiga like toxin producing E coli (STEC) NOT DETECTED NOT DETECTED Final   Shigella/Enteroinvasive E coli (EIEC) NOT DETECTED NOT DETECTED Final   Cryptosporidium NOT DETECTED NOT DETECTED Final   Cyclospora cayetanensis NOT DETECTED NOT DETECTED Final   Entamoeba histolytica NOT DETECTED NOT DETECTED Final   Giardia lamblia NOT DETECTED NOT DETECTED Final   Adenovirus F40/41 NOT DETECTED NOT DETECTED Final   Astrovirus NOT DETECTED NOT DETECTED Final   Norovirus GI/GII NOT DETECTED NOT DETECTED Final   Rotavirus A NOT DETECTED NOT DETECTED Final   Sapovirus (I, II, IV, and V) NOT DETECTED NOT DETECTED Final    Comment: Performed at [facility], [address] [place], [place] [address]  C Difficile Quick Screen w PCR reflex     Status: None   Collection Time: 11/04/20  5:15 PM   Specimen: STOOL  Result Value Ref Range Status   C Diff antigen NEGATIVE NEGATIVE Final   C Diff toxin NEGATIVE NEGATIVE Final   C Diff interpretation No C. difficile detected.  Final    Comment: Performed at [facility], [address] [place], [place] [address]    Coagulation Studies: No results for input(s): LABPROT, INR in the last 72 hours.  Urinalysis: Recent Labs    11/03/20 1527  COLORURINE YELLOW*  LABSPEC 1.008  PHURINE 5.0  GLUCOSEU 150*  HGBUR SMALL*  BILIRUBINUR NEGATIVE  KETONESUR NEGATIVE  PROTEINUR NEGATIVE  NITRITE NEGATIVE  LEUKOCYTESUR NEGATIVE      Imaging: US Renal  Result Date: 11/03/2020 CLINICAL DATA:  Acute kidney injury EXAM: RENAL / URINARY TRACT ULTRASOUND COMPLETE COMPARISON:  October 12, 2020 FINDINGS: Right  Kidney: Renal measurements: 10.1 x 4.7 x 5.2 cm = volume: 129 mL. Echogenicity is increased. Renal cortical thickness is mildly diminished. No mass, perinephric fluid, or hydronephrosis visualized. No sonographically demonstrable calculus or ureterectasis. Left Kidney: Renal measurements: 11.0 x 5.5 x 4.6 cm = volume: 145 mL. Echogenicity is increased. There is a degree of renal cortical thinning. No mass, perinephric fluid, or hydronephrosis visualized. No sonographically demonstrable calculus or ureterectasis. Bladder: Appears normal for degree of bladder distention. Other: None. IMPRESSION: Increased renal echogenicity and renal cortical thinning, findings indicative of medical renal disease. No obstructing focus in either kidney. Electronically Signed   By: Lowella Grip III M.D.   On: 11/03/2020 16:32     Medications:   .  sodium bicarbonate (isotonic) infusion in sterile water 50 mL/hr at 11/04/20 1712   . amLODipine  2.5 mg Oral Daily  . aspirin EC  81 mg Oral Daily  . fenofibrate  160 mg Oral Daily  . heparin injection (subcutaneous)  5,000 Units Subcutaneous Q8H  . hydrALAZINE  25 mg Oral BID  .  mometasone-formoterol  2 puff Inhalation BID  . simvastatin  20 mg Oral QHS  . vitamin B-12  1,000 mcg Oral Daily   albuterol  Assessment/ Plan:  80 y.o. male with chronic kidney disease, hypertension, type 2 diabetes, hyperlipidemia, gout, thoracic aortic aneurysm, obstructive sleep apnea treated with CPAP, GERD, arthritis  admitted on 11/03/2020 for Hyperkalemia [E87.5] AKI (acute kidney injury) ([place]) [N17.9]  #Acute kidney injury-Baseline creatinine 0.89/GFR greater than 60 from September 18, 2018 #Chronic kidney disease unspecified-cortical thinning renal ultrasound Lab Results  Component Value Date   CREATININE 3.04 (H) 11/05/2020   CREATININE 3.96 (H) 11/04/2020   CREATININE 4.13 (H) 11/03/2020  AKI is most likely multifactorial including ATN from low blood pressure in setting  of ACE inhibitor and volume depletion from diarrhea. Also possibility of interstitial disease from excessive use of nonsteroidals.  Differential includes Covid related kidney disease. No acute indication for dialysis  Continue IV hydratioon  #Hyperkalemia Potassium normalized to 4.1  #Anemia with CKD Hemoglobin 8.8 today Will continue monitoring CBCs closely   #Diabetes type 2 with CKD-hemoglobin A1c 5.8% on November 02, 2020 Blood glucose readings within acceptable range   Pertinent labs: Urinalysis November 03, 2020: Glucosuria, negative for protein, negative for blood, 0-5 RBCs.  Urine protein to creatinine ratio 0.5 g November 02, 2020 Imaging: Renal ultrasound November 03, 2020: Increased renal echogenicity and renal cortical thinning.  Findings indicated of medical renal disease.  No obstructing focus in either kidney. Autoimmune labs: October 05, 2020: ANA negative, SPEP negative for M spike, TSH normal January 2022            LOS: 2 [place] 3/19/20222:30 PM  [place], [place]  Note: This note was prepared with Dragon dictation. Any transcription errors are unintentional

## 2020-11-05 NOTE — Progress Notes (Signed)
Patient ID: Tony Mitchell, male   DOB: 07-03-1941, 80 y.o.   MRN: [id number] [facility] PROGRESS NOTE  Tony Mitchell RDE:[id number] DOB: 1941/04/05 DOA: 11/03/2020 PCP: Tony Harrier, MD  HPI/Subjective: Patient states that he did not sleep last night secondary to the bed alarming on the IV alarming all night.  In speaking with nursing staff they needed to get him a new bed.  Patient physically feels okay.  Had no further diarrhea.  Objective: Vitals:   11/05/20 0749 11/05/20 1104  BP: (!) 148/86 (!) 152/85  Pulse: 84 97  Resp: 16 19  Temp: 97.8 F (36.6 C) 98 F (36.7 C)  SpO2: 96% 97%    Intake/Output Summary (Last 24 hours) at 11/05/2020 1421 Last data filed at 11/05/2020 1300 Gross per 24 hour  Intake 799 ml  Output 2125 ml  Net -1326 ml   Filed Weights   11/03/20 1411 11/04/20 0500  Weight: 89.8 kg 89.9 kg    ROS: Review of Systems  Respiratory: Negative for shortness of breath.   Cardiovascular: Negative for chest pain.  Gastrointestinal: Negative for abdominal pain, nausea and vomiting.   Exam: Physical Exam HENT:     Head: Normocephalic.     Mouth/Throat:     Pharynx: No oropharyngeal exudate.  Eyes:     General: Lids are normal.     Conjunctiva/sclera: Conjunctivae normal.  Cardiovascular:     Rate and Rhythm: Normal rate and regular rhythm.     Heart sounds: Normal heart sounds, S1 normal and S2 normal.  Pulmonary:     Breath sounds: Normal breath sounds. No decreased breath sounds, wheezing, rhonchi or rales.  Abdominal:     Palpations: Abdomen is soft.     Tenderness: There is no abdominal tenderness.  Musculoskeletal:     Right ankle: No swelling.     Left ankle: No swelling.  Skin:    General: Skin is warm.     Findings: No rash.  Neurological:     Mental Status: He is alert and oriented to person, place, and time.       Data Reviewed: Basic Metabolic Panel: Recent Labs  Lab 11/03/20 1413 11/03/20 2137  11/04/20 0418 11/05/20 0357  NA 136 137 140 140  K 6.1* 4.6 5.2* 4.1  CL 110 112* 113* 107  CO2 16* 16* 19* 24  GLUCOSE 110* 119* 89 100*  BUN 78* 71* 69* 53*  CREATININE 4.86* 4.13* 3.96* 3.04*  CALCIUM 9.0 8.7* 9.2 9.0   Liver Function Tests: Recent Labs  Lab 11/04/20 0418  AST 30  ALT 29  ALKPHOS 35*  BILITOT 0.7  PROT 6.7  ALBUMIN 3.9   CBC: Recent Labs  Lab 11/03/20 1413 11/04/20 0418 11/05/20 0357  WBC 8.2 6.1 5.5  HGB 10.0* 9.2* 8.8*  HCT 29.5* 26.7* 26.1*  MCV 95.5 95.4 95.3  PLT 255 232 227     Recent Results (from the past 240 hour(s))  SARS CORONAVIRUS 2 (TAT 6-24 HRS) Nasopharyngeal Nasopharyngeal Swab     Status: None   Collection Time: 11/03/20  3:27 PM   Specimen: Nasopharyngeal Swab  Result Value Ref Range Status   SARS Coronavirus 2 NEGATIVE NEGATIVE Final    Comment: (NOTE) SARS-CoV-2 target nucleic acids are NOT DETECTED.  The SARS-CoV-2 RNA is generally detectable in upper and lower respiratory specimens during the acute phase of infection. Negative results do not preclude SARS-CoV-2 infection, do not rule out co-infections with other pathogens, and should not be used  as the sole basis for treatment or other patient management decisions. Negative results must be combined with clinical observations, patient history, and epidemiological information. The expected result is Negative.  Fact Sheet for Patients: SugarRoll.be  Fact Sheet for Healthcare Providers: https://www.woods-mathews.com/  This test is not yet approved or cleared by the [place] FDA and  has been authorized for detection and/or diagnosis of SARS-CoV-2 by FDA under an Emergency Use Authorization (EUA). This EUA will remain  in effect (meaning this test can be used) for the duration of the COVID-19 declaration under Se ction 564(b)(1) of the Act, 21 U.S.C. section 360bbb-3(b)(1), unless the authorization is terminated  or revoked sooner.  Performed at [facility], [place] [address], [place], [place] [address]   Gastrointestinal Panel by PCR , Stool     Status: Abnormal   Collection Time: 11/04/20  5:15 PM   Specimen: Stool  Result Value Ref Range Status   Campylobacter species NOT DETECTED NOT DETECTED Final   Plesimonas shigelloides NOT DETECTED NOT DETECTED Final   Salmonella species NOT DETECTED NOT DETECTED Final   Yersinia enterocolitica NOT DETECTED NOT DETECTED Final   Vibrio species NOT DETECTED NOT DETECTED Final   Vibrio cholerae NOT DETECTED NOT DETECTED Final   Enteroaggregative E coli (EAEC) NOT DETECTED NOT DETECTED Final   Enteropathogenic E coli (EPEC) DETECTED (A) NOT DETECTED Final    Comment: RESULT CALLED TO, READ BACK BY AND VERIFIED WITH: TANIA AUGUSTINE AT 2040 ON 11/04/20 BY SS    Enterotoxigenic E coli (ETEC) NOT DETECTED NOT DETECTED Final   Shiga like toxin producing E coli (STEC) NOT DETECTED NOT DETECTED Final   Shigella/Enteroinvasive E coli (EIEC) NOT DETECTED NOT DETECTED Final   Cryptosporidium NOT DETECTED NOT DETECTED Final   Cyclospora cayetanensis NOT DETECTED NOT DETECTED Final   Entamoeba histolytica NOT DETECTED NOT DETECTED Final   Giardia lamblia NOT DETECTED NOT DETECTED Final   Adenovirus F40/41 NOT DETECTED NOT DETECTED Final   Astrovirus NOT DETECTED NOT DETECTED Final   Norovirus GI/GII NOT DETECTED NOT DETECTED Final   Rotavirus A NOT DETECTED NOT DETECTED Final   Sapovirus (I, II, IV, and V) NOT DETECTED NOT DETECTED Final    Comment: Performed at [facility], [address] [place], [place] [address]  C Difficile Quick Screen w PCR reflex     Status: None   Collection Time: 11/04/20  5:15 PM   Specimen: STOOL  Result Value Ref Range Status   C Diff antigen NEGATIVE NEGATIVE Final   C Diff toxin NEGATIVE NEGATIVE Final   C Diff interpretation No C. difficile detected.  Final    Comment: Performed at [facility], [address] [place],  [address]     Studies: US Renal  Result Date: 11/03/2020 CLINICAL DATA:  Acute kidney injury EXAM: RENAL / URINARY TRACT ULTRASOUND COMPLETE COMPARISON:  October 12, 2020 FINDINGS: Right Kidney: Renal measurements: 10.1 x 4.7 x 5.2 cm = volume: 129 mL. Echogenicity is increased. Renal cortical thickness is mildly diminished. No mass, perinephric fluid, or hydronephrosis visualized. No sonographically demonstrable calculus or ureterectasis. Left Kidney: Renal measurements: 11.0 x 5.5 x 4.6 cm = volume: 145 mL. Echogenicity is increased. There is a degree of renal cortical thinning. No mass, perinephric fluid, or hydronephrosis visualized. No sonographically demonstrable calculus or ureterectasis. Bladder: Appears normal for degree of bladder distention. Other: None. IMPRESSION: Increased renal echogenicity and renal cortical thinning, findings indicative of medical renal disease. No obstructing  focus in either kidney. Electronically Signed   By: Lowella Grip III M.D.   On: 11/03/2020 16:32    Scheduled Meds: . amLODipine  2.5 mg Oral Daily  . aspirin EC  81 mg Oral Daily  . fenofibrate  160 mg Oral Daily  . heparin injection (subcutaneous)  5,000 Units Subcutaneous Q8H  . hydrALAZINE  25 mg Oral BID  . mometasone-formoterol  2 puff Inhalation BID  . simvastatin  20 mg Oral QHS  . vitamin B-12  1,000 mcg Oral Daily   Continuous Infusions: .  sodium bicarbonate (isotonic) infusion in sterile water 50 mL/hr at 11/04/20 1712    Assessment/Plan:  1. Acute kidney injury with a baseline creatinine in 2020 of 0.89.  Nephrology sent the patient in for worsening creatinine.  On presentation creatinine 4.86.  Patient's creatinine today is down to 3.04.  Nephrology wants to watch creatinine at least 1 more day in the hospital.  Acute kidney injury likely multifactorial in nature with previous ibuprofen, some periods of low blood pressure as outpatient, also on  Glucophage and lisinopril as outpatient.  On bicarb drip.  Check creatinine again tomorrow. 2. Hyperkalemia resolved with improving kidney function and Lokelma.  We will hold off on Lokelma today and recheck potassium tomorrow. 3. Enteropathogenic E. Coli.  Patient states he is not having diarrhea today.  Will hold off on any treatment at this point. 4. Anemia of chronic disease.  Last hemoglobin 8.8.  Continue to monitor 5. Essential hypertension.  Continue amlodipine and hydralazine 6. Type 2 diabetes mellitus with hyperlipidemia unspecified on Zocor.  Glucophage not a good medication with chronic kidney disease.  Outpatient hemoglobin A1c of 5.8 likely can watch off medications for diabetes. 7. 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 COPD on Dulera inhaler.        Code Status:     Code Status Orders  (From admission, onward)         Start     Ordered   11/03/20 1523  Full code  Continuous        11/03/20 1523        Code Status History    Date Active Date Inactive Code Status Order ID Comments User Context   09/16/2018 1050 09/18/2018 1501 Full Code [id number]  Corky Mull, MD Inpatient   03/03/2018 2249 03/04/2018 1426 Full Code [id number]  Vickie Epley, MD Inpatient   09/18/2017 1511 09/21/2017 1439 Full Code [id number]  Jules Husbands, MD Inpatient   06/03/2016 0208 06/05/2016 1621 Full Code [id number]  Olean Ree, MD ED   Advance Care Planning Activity     Family Communication: Spoke with wife on the phone. Disposition Plan: Status is: Inpatient  Dispo:  Patient From: Home  Planned Disposition: Home.  Potentially tomorrow if creatinine improves.  Medically stable for discharge: No  Time spent: 28 minutes  [place]

## 2020-11-06 DIAGNOSIS — A040 Enteropathogenic Escherichia coli infection: Secondary | ICD-10-CM

## 2020-11-06 DIAGNOSIS — E785 Hyperlipidemia, unspecified: Secondary | ICD-10-CM

## 2020-11-06 DIAGNOSIS — E1169 Type 2 diabetes mellitus with other specified complication: Secondary | ICD-10-CM

## 2020-11-06 DIAGNOSIS — E781 Pure hyperglyceridemia: Secondary | ICD-10-CM

## 2020-11-06 DIAGNOSIS — I10 Essential (primary) hypertension: Secondary | ICD-10-CM

## 2020-11-06 DIAGNOSIS — E875 Hyperkalemia: Secondary | ICD-10-CM

## 2020-11-06 DIAGNOSIS — D638 Anemia in other chronic diseases classified elsewhere: Secondary | ICD-10-CM

## 2020-11-06 LAB — BASIC METABOLIC PANEL
Anion gap: 9 (ref 5–15)
BUN: 41 mg/dL — ABNORMAL HIGH (ref 8–23)
CO2: 26 mmol/L (ref 22–32)
Calcium: 8.9 mg/dL (ref 8.9–10.3)
Chloride: 102 mmol/L (ref 98–111)
Creatinine, Ser: 2.54 mg/dL — ABNORMAL HIGH (ref 0.61–1.24)
GFR, Estimated: 25 mL/min — ABNORMAL LOW (ref >60)
Glucose, Bld: 123 mg/dL — ABNORMAL HIGH (ref 70–99)
Potassium: 3.9 mmol/L (ref 3.5–5.1)
Sodium: 137 mmol/L (ref 135–145)

## 2020-11-06 LAB — CBC
HCT: 27.7 % — ABNORMAL LOW (ref 39.0–52.0)
Hemoglobin: 9.5 g/dL — ABNORMAL LOW (ref 13.0–17.0)
MCH: 32.4 pg (ref 26.0–34.0)
MCHC: 34.3 g/dL (ref 30.0–36.0)
MCV: 94.5 fL (ref 80.0–100.0)
Platelets: 225 10*3/uL (ref 150–400)
RBC: 2.93 MIL/uL — ABNORMAL LOW (ref 4.22–5.81)
RDW: 12.7 % (ref 11.5–15.5)
WBC: 6.2 10*3/uL (ref 4.0–10.5)
nRBC: 0 % (ref 0.0–0.2)

## 2020-11-06 NOTE — Progress Notes (Signed)
Discharge Note: Reviewed discharge instructions with pt. Pt verbalized understanding. Pt retrieved valuable belonging from the safe. Pt d/ced to home with personal belongings. Pt transported to home via private transport.

## 2020-11-06 NOTE — Progress Notes (Signed)
Tony Mitchell, [place] 11/06/20  Subjective:   Hospital day # 3  Patient found sitting up in a chair near the bed, preparing to go home. His renal function improved. He denies further diarrhea, nausea or vomiting.  Objective:  Vital signs in last 24 hours:  Temp:  [97.8 F (36.6 C)-98.1 F (36.7 C)] 98 F (36.7 C) (03/20 0842) Pulse Rate:  [79-92] 92 (03/20 0842) Resp:  [16-18] 18 (03/20 0842) BP: (134-158)/(82-97) 156/87 (03/20 0842) SpO2:  [96 %-98 %] 97 % (03/20 0842)  Weight change:  Filed Weights   11/03/20 1411 11/04/20 0500  Weight: 89.8 kg 89.9 kg    Intake/Output:    Intake/Output Summary (Last 24 hours) at 11/06/2020 1541 Last data filed at 11/05/2020 2330 Gross per 24 hour  Intake 240 ml  Output 650 ml  Net -410 ml    Physical Exam: General:  In no acute distress  HEENT   oral mucous membranes moist  Pulm/lungs lungs clear  CVS/Heart  S1S2, no rub or gallops  Abdomen:   Soft, non tender,non distended  Extremities:  No peripheral edema  Neurologic:  Oriented x 3  Skin:  No acute rashes or lesions    Basic Metabolic Panel:  Recent Labs  Lab 11/03/20 1413 11/03/20 2137 11/04/20 0418 11/05/20 0357 11/06/20 0408  NA 136 137 140 140 137  K 6.1* 4.6 5.2* 4.1 3.9  CL 110 112* 113* 107 102  CO2 16* 16* 19* 24 26  GLUCOSE 110* 119* 89 100* 123*  BUN 78* 71* 69* 53* 41*  CREATININE 4.86* 4.13* 3.96* 3.04* 2.54*  CALCIUM 9.0 8.7* 9.2 9.0 8.9     CBC: Recent Labs  Lab 11/03/20 1413 11/04/20 0418 11/05/20 0357 11/06/20 0408  WBC 8.2 6.1 5.5 6.2  HGB 10.0* 9.2* 8.8* 9.5*  HCT 29.5* 26.7* 26.1* 27.7*  MCV 95.5 95.4 95.3 94.5  PLT 255 232 227 225     No results found for: HEPBSAG, HEPBSAB, HEPBIGM    Microbiology:  Recent Results (from the past 240 hour(s))  SARS CORONAVIRUS 2 (TAT 6-24 HRS) Nasopharyngeal Nasopharyngeal Swab     Status: None   Collection Time: 11/03/20  3:27 PM   Specimen: Nasopharyngeal  Swab  Result Value Ref Range Status   SARS Coronavirus 2 NEGATIVE NEGATIVE Final    Comment: (NOTE) SARS-CoV-2 target nucleic acids are NOT DETECTED.  The SARS-CoV-2 RNA is generally detectable in upper and lower respiratory specimens during the acute phase of infection. Negative results do not preclude SARS-CoV-2 infection, do not rule out co-infections with other pathogens, and should not be used as the sole basis for treatment or other patient management decisions. Negative results must be combined with clinical observations, patient history, and epidemiological information. The expected result is Negative.  Fact Sheet for Patients: SugarRoll.be  Fact Sheet for Healthcare Providers: https://www.woods-mathews.com/  This test is not yet approved or cleared by the [place] FDA and  has been authorized for detection and/or diagnosis of SARS-CoV-2 by FDA under an Emergency Use Authorization (EUA). This EUA will remain  in effect (meaning this test can be used) for the duration of the COVID-19 declaration under Se ction 564(b)(1) of the Act, 21 U.S.C. section 360bbb-3(b)(1), unless the authorization is terminated or revoked sooner.  Performed at [facility], [place] [address], [place], [place] [address]   Gastrointestinal Panel by PCR , Stool     Status: Abnormal   Collection Time: 11/04/20  5:15 PM  Specimen: Stool  Result Value Ref Range Status   Campylobacter species NOT DETECTED NOT DETECTED Final   Plesimonas shigelloides NOT DETECTED NOT DETECTED Final   Salmonella species NOT DETECTED NOT DETECTED Final   Yersinia enterocolitica NOT DETECTED NOT DETECTED Final   Vibrio species NOT DETECTED NOT DETECTED Final   Vibrio cholerae NOT DETECTED NOT DETECTED Final   Enteroaggregative E coli (EAEC) NOT DETECTED NOT DETECTED Final   Enteropathogenic E coli (EPEC) DETECTED (A) NOT DETECTED Final    Comment: RESULT CALLED TO, READ  BACK BY AND VERIFIED WITH: TANIA AUGUSTINE AT 2040 ON 11/04/20 BY SS    Enterotoxigenic E coli (ETEC) NOT DETECTED NOT DETECTED Final   Shiga like toxin producing E coli (STEC) NOT DETECTED NOT DETECTED Final   Shigella/Enteroinvasive E coli (EIEC) NOT DETECTED NOT DETECTED Final   Cryptosporidium NOT DETECTED NOT DETECTED Final   Cyclospora cayetanensis NOT DETECTED NOT DETECTED Final   Entamoeba histolytica NOT DETECTED NOT DETECTED Final   Giardia lamblia NOT DETECTED NOT DETECTED Final   Adenovirus F40/41 NOT DETECTED NOT DETECTED Final   Astrovirus NOT DETECTED NOT DETECTED Final   Norovirus GI/GII NOT DETECTED NOT DETECTED Final   Rotavirus A NOT DETECTED NOT DETECTED Final   Sapovirus (I, II, IV, and V) NOT DETECTED NOT DETECTED Final    Comment: Performed at [facility], [address] [place], [place] [address]  C Difficile Quick Screen w PCR reflex     Status: None   Collection Time: 11/04/20  5:15 PM   Specimen: STOOL  Result Value Ref Range Status   C Diff antigen NEGATIVE NEGATIVE Final   C Diff toxin NEGATIVE NEGATIVE Final   C Diff interpretation No C. difficile detected.  Final    Comment: Performed at [facility], [address] [place], [place] [address]    Coagulation Studies: No results for input(s): LABPROT, INR in the last 72 hours.  Urinalysis: No results for input(s): COLORURINE, LABSPEC, PHURINE, GLUCOSEU, HGBUR, BILIRUBINUR, KETONESUR, PROTEINUR, UROBILINOGEN, NITRITE, LEUKOCYTESUR in the last 72 hours.  Invalid input(s): APPERANCEUR    Imaging: No results found.   Medications:    . amLODipine  2.5 mg Oral Daily  . aspirin EC  81 mg Oral Daily  . fenofibrate  160 mg Oral Daily  . heparin injection (subcutaneous)  5,000 Units Subcutaneous Q8H  . hydrALAZINE  25 mg Oral BID  . mometasone-formoterol  2 puff Inhalation BID  . simvastatin  20 mg Oral QHS  . vitamin B-12  1,000 mcg Oral Daily   albuterol  Assessment/  Plan:  80 y.o. male with chronic kidney disease, hypertension, type 2 diabetes, hyperlipidemia, gout, thoracic aortic aneurysm, obstructive sleep apnea treated with CPAP, GERD, arthritis  admitted on 11/03/2020 for Hyperkalemia [E87.5] AKI (acute kidney injury) ([place]) [N17.9]  #Acute kidney injury-Baseline creatinine 0.89/GFR greater than 60 from September 18, 2018 #Chronic kidney disease unspecified-cortical thinning renal ultrasound Lab Results  Component Value Date   CREATININE 2.54 (H) 11/06/2020   CREATININE 3.04 (H) 11/05/2020   CREATININE 3.96 (H) 11/04/2020  AKI is most likely multifactorial including ATN from low blood pressure in setting of ACE inhibitor and volume depletion from diarrhea. Also possibility of interstitial disease from excessive use of nonsteroidals.  Differential includes Covid related kidney disease. Renal function improved with hydration Patient getting ready to get discharged home Advised follow up as outpatient  #Hyperkalemia Potassium stays stable, 3.9 today  #Anemia with CKD  Will continue monitoring CBCs  as outpatient  #Diabetes type 2 with CKD-hemoglobin A1c 5.8% on November 02, 2020 Blood glucose readings within acceptable range               LOS: 3 Crosby Oyster 3/20/20223:41 PM  [place], [place]  Note: This note was prepared with Dragon dictation. Any transcription errors are unintentional

## 2020-11-06 NOTE — Plan of Care (Signed)
Problem: Education: Goal: Knowledge of General Education information will improve Description: Including pain rating scale, medication(s)/side effects and non-pharmacologic comfort measures 11/06/2020 1257 by Tony Holtmeyer Bet, LPN Outcome: Adequate for Discharge 11/06/2020 1257 by Tony Bartram Bet, LPN Outcome: Progressing 11/06/2020 1146 by Tony Cirrincione Bet, LPN Outcome: Progressing   Problem: Health Behavior/Discharge Planning: Goal: Ability to manage health-related needs will improve 11/06/2020 1257 by Tony Gude Bet, LPN Outcome: Adequate for Discharge 11/06/2020 1257 by Tony Manolis Bet, LPN Outcome: Progressing 11/06/2020 1146 by Tony Naas Bet, LPN Outcome: Progressing   Problem: Clinical Measurements: Goal: Ability to maintain clinical measurements within normal limits will improve 11/06/2020 1257 by Tony Fuhriman Bet, LPN Outcome: Adequate for Discharge 11/06/2020 1257 by Tony Riner Bet, LPN Outcome: Progressing 11/06/2020 1146 by Tony Azucena Bet, LPN Outcome: Progressing Goal: Will remain free from infection 11/06/2020 1257 by Tony Shedlock Bet, LPN Outcome: Adequate for Discharge 11/06/2020 1257 by Tony Dymond Bet, LPN Outcome: Progressing 11/06/2020 1146 by Tony Patry Bet, LPN Outcome: Progressing Goal: Diagnostic test results will improve 11/06/2020 1257 by Tony Beville Bet, LPN Outcome: Adequate for Discharge 11/06/2020 1257 by Tony Gendron Bet, LPN Outcome: Progressing 11/06/2020 1146 by Tony Pottinger Bet, LPN Outcome: Progressing Goal: Respiratory complications will improve 11/06/2020 1257 by Virlan Kempker Bet, LPN Outcome: Adequate for Discharge 11/06/2020 1257 by Asma Boldon Bet, LPN Outcome: Progressing 11/06/2020 1146 by Latronda Spink Bet, LPN Outcome: Progressing Goal: Cardiovascular complication will be avoided 11/06/2020 1257 by Thedora Rings Bet, LPN Outcome: Adequate for Discharge 11/06/2020 1257 by Zahki Hoogendoorn Bet, LPN Outcome: Progressing 11/06/2020 1146 by Goebel Hellums Bet, LPN Outcome:  Progressing   Problem: Activity: Goal: Risk for activity intolerance will decrease 11/06/2020 1257 by Agatha Duplechain Bet, LPN Outcome: Adequate for Discharge 11/06/2020 1257 by Reice Bienvenue Bet, LPN Outcome: Progressing 11/06/2020 1146 by Scarlet Abad Bet, LPN Outcome: Progressing   Problem: Nutrition: Goal: Adequate nutrition will be maintained 11/06/2020 1257 by Eldar Robitaille Bet, LPN Outcome: Adequate for Discharge 11/06/2020 1257 by Lamarius Dirr Bet, LPN Outcome: Progressing 11/06/2020 1146 by Rian Koon Bet, LPN Outcome: Progressing   Problem: Coping: Goal: Level of anxiety will decrease 11/06/2020 1257 by Khiyan Crace Bet, LPN Outcome: Adequate for Discharge 11/06/2020 1257 by Anahli Arvanitis Bet, LPN Outcome: Progressing 11/06/2020 1146 by Kortne All Bet, LPN Outcome: Progressing   Problem: Elimination: Goal: Will not experience complications related to bowel motility 11/06/2020 1257 by Gabriellah Rabel Bet, LPN Outcome: Adequate for Discharge 11/06/2020 1257 by Sascha Palma Bet, LPN Outcome: Progressing 11/06/2020 1146 by Maguire Sime Bet, LPN Outcome: Progressing Goal: Will not experience complications related to urinary retention 11/06/2020 1257 by Shahad Mazurek Bet, LPN Outcome: Adequate for Discharge 11/06/2020 1257 by Carlena Ruybal Bet, LPN Outcome: Progressing 11/06/2020 1146 by Kamera Dubas Bet, LPN Outcome: Progressing   Problem: Pain Managment: Goal: General experience of comfort will improve 11/06/2020 1257 by Ellinor Test Bet, LPN Outcome: Adequate for Discharge 11/06/2020 1257 by Lovenia Debruler Bet, LPN Outcome: Progressing 11/06/2020 1146 by Vasilisa Vore Bet, LPN Outcome: Progressing   Problem: Safety: Goal: Ability to remain free from injury will improve 11/06/2020 1257 by Kyree Fedorko Bet, LPN Outcome: Adequate for Discharge 11/06/2020 1257 by Itzamara Casas Bet, LPN Outcome: Progressing 11/06/2020 1146 by Semaj Kham Bet, LPN Outcome: Progressing   Problem: Skin Integrity: Goal: Risk for impaired skin  integrity will decrease 11/06/2020 1257 by Addy Mcmannis Bet, LPN Outcome: Adequate for Discharge 11/06/2020 1257 by Floria Brandau Bet, LPN Outcome: Progressing 11/06/2020 1146 by Torrance Frech Bet, LPN  Outcome: Progressing   

## 2020-11-06 NOTE — Discharge Instructions (Addendum)
Do not take and ibuprofen, motrin, alleve, goody podwer Tylenol if any pain No glucophage (metformin)  Acute Kidney Injury, Adult  Acute kidney injury is a sudden worsening of kidney function. The kidneys are organs that have several jobs. They filter the blood to remove waste products and extra fluid. They also maintain a healthy balance of minerals and hormones in the body, which helps control blood pressure and keep bones strong. With this condition, your kidneys do not do their jobs as well as they should. This condition ranges from mild to severe. Over time, it may develop into long-lasting (chronic) kidney disease. Early detection and treatment may prevent acute kidney injury from developing into a chronic condition. What are the causes? Common causes of this condition include:  A problem with blood flow to the kidneys. This may be caused by: ? Low blood pressure (hypotension) or shock. ? Blood loss. ? Heart and blood vessel (cardiovascular) disease. ? Severe burns. ? Liver disease.  Direct damage to the kidneys. This may be caused by: ? Certain medicines. ? A kidney infection. ? Poisoning. ? Being around or in contact with toxic substances. ? A surgical wound. ? A hard, direct hit to the kidney area.  A sudden blockage of urine flow. This may be caused by: ? Cancer. ? Kidney stones. ? An enlarged prostate in males. What increases the risk? You are more likely to develop this condition if you:  Are older than age 59.  Are male.  Are hospitalized, especially if you are in critical condition.  Have certain conditions, such as: ? Chronic kidney disease. ? Diabetes. ? Coronary artery disease and heart failure. ? Pulmonary disease. ? Chronic liver disease. What are the signs or symptoms? Symptoms of this condition may not be obvious until the condition becomes severe. Symptoms of this condition can include:  Tiredness (lethargy) or difficulty staying  awake.  Nausea or vomiting.  Swelling (edema) of the face, legs, ankles, or feet.  Problems with urination, such as: ? Pain in the abdomen, or pain along the side of your stomach (flank). ? Producing little or no urine. ? Passing urine with a weak flow.  Muscle twitches and cramps, especially in the legs.  Confusion or trouble concentrating.  Loss of appetite.  Fever. How is this diagnosed? Your health care provider can diagnose this condition based on your symptoms, medical history, and a physical exam.  You may also have other tests, such as:  Blood tests.  Urine tests.  Imaging tests.  A test in which a sample of tissue is removed from the kidneys to be examined under a microscope (kidney biopsy). How is this treated? Treatment for this condition depends on the cause and how severe the condition is. In mild cases, treatment may not be needed. The kidneys may heal on their own. In more severe cases, treatment will involve:  Treating the cause of the kidney injury. This may involve changing any medicines you are taking or adjusting your dosage.  Fluids. You may need specialized IV fluids to balance your body's needs.  Having a catheter placed to drain urine and prevent blockages.  Preventing problems from occurring. This may mean avoiding certain medicines or procedures that can cause further injury to the kidneys. In some cases, treatment may also require:  A procedure to remove toxic wastes from the body (dialysis or continuous renal replacement therapy, CRRT).  Surgery. This may be done to repair a torn kidney or to remove the blockage from  the urinary system. Follow these instructions at home: Medicines  Take over-the-counter and prescription medicines only as told by your health care provider.  Do not take any new medicines without your health care provider's approval. Many medicines can worsen your kidney damage.  Do not take any vitamin and mineral  supplements without your health care provider's approval. Many nutritional supplements can worsen your kidney damage. Lifestyle  If your health care provider prescribed changes to your diet, follow them. You may need to decrease the amount of protein you eat.  Achieve and maintain a healthy weight. If you need help with this, ask your health care provider.  Start or continue an exercise plan. Try to exercise at least 30 minutes a day, 5 days a week.  Do not use any products that contain nicotine or tobacco, such as cigarettes, e-cigarettes, and chewing tobacco. If you need help quitting, ask your health care provider.   General instructions  Keep track of your blood pressure. Report changes in your blood pressure as told by your health care provider.  Stay up to date with your vaccines. Ask your health care provider which vaccines you need.  Keep all follow-up visits as told by your health care provider. This is important.   Where to find more information  American Association of Kidney Patients: BombTimer.gl  National Kidney Foundation: www.kidney.[place]: https://mathis.com/  [facility]: ? www.lifeoptions.org ? www.kidneyschool.org Contact a health care provider if:  Your symptoms get worse.  You develop new symptoms. Get help right away if:  You develop symptoms of worsening kidney disease, which include: ? Headaches. ? Abnormally dark or light skin. ? Easy bruising. ? Frequent hiccups. ? Chest pain. ? Shortness of breath. ? End of menstruation in women. ? Seizures. ? Confusion or altered mental status. ? Abdominal or back pain. ? Itchiness.  You have a fever.  Your body is producing less urine.  You have pain or bleeding when you urinate. Summary  Acute kidney injury is a sudden worsening of kidney function.  Acute kidney injury can be caused by problems with blood flow to the kidneys, direct damage to the kidneys, and  sudden blockage of urine flow.  Symptoms of this condition may not be obvious until it becomes severe. Symptoms may include edema, lethargy, confusion, nausea or vomiting, and problems passing urine.  This condition can be diagnosed with blood tests, urine tests, and imaging tests. Sometimes a kidney biopsy is done to diagnose this condition.  Treatment for this condition often involves treating the underlying cause. It is treated with fluids, medicines, diet changes, dialysis, or surgery. This information is not intended to replace advice given to you by your health care provider. Make sure you discuss any questions you have with your health care provider. Document Revised: 06/16/2019 Document Reviewed: 06/16/2019 Elsevier Patient Education  2021 [employer].

## 2020-11-06 NOTE — Discharge Summary (Signed)
[place] [facility] [place] NAME: Tony Mitchell    MR#:  [id number]  DATE OF BIRTH:  05-Apr-1941  DATE OF ADMISSION:  11/03/2020 ADMITTING PHYSICIAN: Kayleen Memos, DO  DATE OF DISCHARGE: 11/06/2020  1:06 PM  PRIMARY CARE PHYSICIAN: Tracie Harrier, MD    ADMISSION DIAGNOSIS:  Hyperkalemia [E87.5] AKI (acute kidney injury) ([place]) [N17.9]  DISCHARGE DIAGNOSIS:  Acute kidney injury Hyperkalemia Enteropathogenic E. 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 Acute appendicitis 03/03/2018  . Arthritis   . COPD (chronic obstructive pulmonary disease) ([place])   . Coronary artery disease   . Difficult intubation   . GERD (gastroesophageal reflux disease)   . Gout   . Hyperlipidemia   . Hypertension   . Sleep apnea     HOSPITAL COURSE:   1.  Acute kidney injury with baseline creatinine in 2020 and 0.89.  Nephrology referred the patient into the hospital for worsening creatinine.  On presentation here his creatinine was elevated at 4.86. Patient was started on bicarb drip.  The patient's creatinine did come down to 2.54 on disposition home.  Will need follow-up with nephrology as outpatient.  Acute kidney injury likely multifactorial in nature.  Patient was taking ibuprofen for about a month and a half and he has had periods of low blood pressure as outpatient.  Patient was also on Glucophage and lisinopril and he has had diarrhea as outpatient.  Patient was advised not to take any ibuprofen or other NSAIDs.  We will hold lisinopril.  Glucophage discontinued.  Follow-up BMP and follow-up appointment 2.  Hyperkalemia on presentation with acute kidney injury.  Patient improved with Lokelma and IV fluids.  Potassium 6.1 on presentation and down to 3.9 upon discharge home.  Continue to  hold lisinopril. 3.  Enteropathogenic E. coli.  Patient states that he has been having diarrhea since he had a Covid infection.  Stool studies sent off and C. difficile was negative but enteropathogenic E. coli came back positive.  Since the patient's diarrhea had settled down, we decided to hold off on treatment at this point. 4.  Anemia of chronic disease.  Hemoglobin upon discharge 9.5 5.  Essential hypertension continue amlodipine and hydralazine.  Patient does have whitecoat syndrome so look at the patient's home blood pressure logs prior to adjusting medications. 6.  Type 2 diabetes mellitus with hyperlipidemia unspecified on Zocor.  Patient's outpatient hemoglobin A1c is 5.8 and we can watch off medications at this point.  Glucophage not a good medication with kidney impairment.  Continue Zocor. 7.  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  COPD on Symbicort inhaler  DISCHARGE CONDITIONS:   Satisfactory  CONSULTS OBTAINED:  Treatment Team:  Murlean Iba, MD  DRUG ALLERGIES:  No Known Allergies  DISCHARGE MEDICATIONS:   Allergies as of 11/06/2020   No Known Allergies     Medication List    STOP taking these medications   gabapentin 300 MG capsule Commonly known as: NEURONTIN   lisinopril 30 MG tablet Commonly known as: ZESTRIL     TAKE these medications   allopurinol 100 MG tablet Commonly known as: ZYLOPRIM Take 100 mg by mouth daily.   ALPRAZolam 0.5 MG tablet Commonly known as: XANAX Take 0.5 mg by mouth at bedtime.   amLODipine 2.5 MG tablet Commonly known as: NORVASC Take 2.5 mg by mouth  daily.   aspirin EC 81 MG tablet Take 81 mg by mouth daily.   fenofibrate 145 MG tablet Commonly known as: TRICOR Take 145 mg by mouth daily.   hydrALAZINE 25 MG tablet Commonly known as: APRESOLINE Take 25 mg by mouth 2 (two) times daily.   Krill Oil 1000 MG Caps Take 1,000 mg by mouth daily.   lansoprazole 15 MG capsule Commonly known as: PREVACID Take 15 mg  by mouth every evening.   simvastatin 20 MG tablet Commonly known as: ZOCOR Take 20 mg by mouth at bedtime.   Symbicort 80-4.5 MCG/ACT inhaler Generic drug: budesonide-formoterol Inhale 2 puffs into the lungs 2 (two) times daily.   vitamin B-12 1000 MCG tablet Commonly known as: CYANOCOBALAMIN Take 1,000 mcg by mouth daily.        DISCHARGE INSTRUCTIONS:   Follow-up nephrology this week Follow-up PMD 5 days  If you experience worsening of your admission symptoms, develop shortness of breath, life threatening emergency, suicidal or homicidal thoughts you must seek medical attention immediately by calling 911 or calling your MD immediately  if symptoms less severe.  You Must read complete instructions/literature along with all the possible adverse reactions/side effects for all the Medicines you take and that have been prescribed to you. Take any new Medicines after you have completely understood and accept all the possible adverse reactions/side effects.   Please note  You were cared for by a hospitalist during your hospital stay. If you have any questions about your discharge medications or the care you received while you were in the hospital after you are discharged, you can call the unit and asked to speak with the hospitalist on call if the hospitalist that took care of you is not available. Once you are discharged, your primary care physician will handle any further medical issues. Please note that NO REFILLS for any discharge medications will be authorized once you are discharged, as it is imperative that you return to your primary care physician (or establish a relationship with a primary care physician if you do not have one) for your aftercare needs so that they can reassess your need for medications and monitor your lab values.    Today   CHIEF COMPLAINT:   Chief Complaint  Patient presents with  . Abnormal Lab    HISTORY OF PRESENT ILLNESS:  Tony Mitchell  is a  80 y.o. male sent into the hospital for worsening creatinine   VITAL SIGNS:  Blood pressure (!) 156/87, pulse 92, temperature 98 F (36.7 C), temperature source Oral, resp. rate 18, height 5\' 9"  (1.753 m), weight 89.9 kg, SpO2 97 %.  I/O:    Intake/Output Summary (Last 24 hours) at 11/06/2020 1541 Last data filed at 11/05/2020 2330 Gross per 24 hour  Intake 240 ml  Output 650 ml  Net -410 ml    PHYSICAL EXAMINATION:  GENERAL:  80 y.o.-year-old patient lying in the bed with no acute distress.  EYES: No scleral icterus. HEENT: Head atraumatic, normocephalic. Oropharynx and nasopharynx clear.   LUNGS: Normal breath sounds bilaterally, no wheezing, rales,rhonchi or crepitation. No use of accessory muscles of respiration.  CARDIOVASCULAR: S1, S2 normal. No murmurs, rubs, or gallops.  ABDOMEN: Soft, non-tender, non-distended.  EXTREMITIES: No pedal edema.  NEUROLOGIC: Cranial nerves II through XII are intact. PSYCHIATRIC: The patient is alert and oriented x 3.  SKIN: No obvious rash, lesion, or ulcer.   DATA REVIEW:   CBC Recent Labs  Lab 11/06/20 0408  WBC 6.2  HGB 9.5*  HCT 27.7*  PLT 225    Chemistries  Recent Labs  Lab 11/04/20 0418 11/05/20 0357 11/06/20 0408  NA 140   < > 137  K 5.2*   < > 3.9  CL 113*   < > 102  CO2 19*   < > 26  GLUCOSE 89   < > 123*  BUN 69*   < > 41*  CREATININE 3.96*   < > 2.54*  CALCIUM 9.2   < > 8.9  AST 30  --   --   ALT 29  --   --   ALKPHOS 35*  --   --   BILITOT 0.7  --   --    < > = values in this interval not displayed.     Microbiology Results  Results for orders placed or performed during the hospital encounter of 11/03/20  SARS CORONAVIRUS 2 (TAT 6-24 HRS) Nasopharyngeal Nasopharyngeal Swab     Status: None   Collection Time: 11/03/20  3:27 PM   Specimen: Nasopharyngeal Swab  Result Value Ref Range Status   SARS Coronavirus 2 NEGATIVE NEGATIVE Final    Comment: (NOTE) SARS-CoV-2 target nucleic acids are NOT  DETECTED.  The SARS-CoV-2 RNA is generally detectable in upper and lower respiratory specimens during the acute phase of infection. Negative results do not preclude SARS-CoV-2 infection, do not rule out co-infections with other pathogens, and should not be used as the sole basis for treatment or other patient management decisions. Negative results must be combined with clinical observations, patient history, and epidemiological information. The expected result is Negative.  Fact Sheet for Patients: SugarRoll.be  Fact Sheet for Healthcare Providers: https://www.woods-mathews.com/  This test is not yet approved or cleared by the [place] FDA and  has been authorized for detection and/or diagnosis of SARS-CoV-2 by FDA under an Emergency Use Authorization (EUA). This EUA will remain  in effect (meaning this test can be used) for the duration of the COVID-19 declaration under Se ction 564(b)(1) of the Act, 21 U.S.C. section 360bbb-3(b)(1), unless the authorization is terminated or revoked sooner.  Performed at [facility], [place] [address], [place], [place] [address]   Gastrointestinal Panel by PCR , Stool     Status: Abnormal   Collection Time: 11/04/20  5:15 PM   Specimen: Stool  Result Value Ref Range Status   Campylobacter species NOT DETECTED NOT DETECTED Final   Plesimonas shigelloides NOT DETECTED NOT DETECTED Final   Salmonella species NOT DETECTED NOT DETECTED Final   Yersinia enterocolitica NOT DETECTED NOT DETECTED Final   Vibrio species NOT DETECTED NOT DETECTED Final   Vibrio cholerae NOT DETECTED NOT DETECTED Final   Enteroaggregative E coli (EAEC) NOT DETECTED NOT DETECTED Final   Enteropathogenic E coli (EPEC) DETECTED (A) NOT DETECTED Final    Comment: RESULT CALLED TO, READ BACK BY AND VERIFIED WITH: TANIA AUGUSTINE AT 2040 ON 11/04/20 BY SS    Enterotoxigenic E coli (ETEC) NOT DETECTED NOT DETECTED Final    Shiga like toxin producing E coli (STEC) NOT DETECTED NOT DETECTED Final   Shigella/Enteroinvasive E coli (EIEC) NOT DETECTED NOT DETECTED Final   Cryptosporidium NOT DETECTED NOT DETECTED Final   Cyclospora cayetanensis NOT DETECTED NOT DETECTED Final   Entamoeba histolytica NOT DETECTED NOT DETECTED Final   Giardia lamblia NOT DETECTED NOT DETECTED Final   Adenovirus F40/41 NOT DETECTED NOT DETECTED Final   Astrovirus NOT DETECTED NOT DETECTED Final   Norovirus GI/GII NOT  DETECTED NOT DETECTED Final   Rotavirus A NOT DETECTED NOT DETECTED Final   Sapovirus (I, II, IV, and V) NOT DETECTED NOT DETECTED Final    Comment: Performed at [facility], [place], [place] [address]  C Difficile Quick Screen w PCR reflex     Status: None   Collection Time: 11/04/20  5:15 PM   Specimen: STOOL  Result Value Ref Range Status   C Diff antigen NEGATIVE NEGATIVE Final   C Diff toxin NEGATIVE NEGATIVE Final   C Diff interpretation No C. difficile detected.  Final    Comment: Performed at [facility], [address], [place],  [address]     Management plans discussed with the patient, family and they are in agreement.  CODE STATUS:     Code Status Orders  (From admission, onward)         Start     Ordered   11/03/20 1523  Full code  Continuous        11/03/20 1523        Code Status History    Date Active Date Inactive Code Status Order ID Comments User Context   09/16/2018 1050 09/18/2018 1501 Full Code [id number]  Corky Mull, MD Inpatient   03/03/2018 2249 03/04/2018 1426 Full Code [id number]  Vickie Epley, MD Inpatient   09/18/2017 1511 09/21/2017 1439 Full Code [id number]  Jules Husbands, MD Inpatient   06/03/2016 0208 06/05/2016 1621 Full Code [id number]  Olean Ree, MD ED   Advance Care Planning Activity      TOTAL TIME TAKING CARE OF THIS PATIENT: 35 minutes.    Loletha Grayer M.D on 11/06/2020 at 3:41 PM  Between 7am to 6pm -  Pager - [phone]  After 6pm go to [email or web address] - password [email or web address]  [facility]  CC: Primary care physician; Tracie Harrier, MD

## 2020-11-07 LAB — GLOMERULAR BASEMENT MEMBRANE ANTIBODIES: GBM Ab: 3 units (ref 0–20)

## 2020-11-07 LAB — ANA W/REFLEX IF POSITIVE: Anti Nuclear Antibody (ANA): NEGATIVE

## 2020-11-07 LAB — C3 COMPLEMENT: C3 Complement: 153 mg/dL (ref 82–167)

## 2020-11-07 LAB — C4 COMPLEMENT: Complement C4, Body Fluid: 26 mg/dL (ref 12–38)

## 2020-11-08 LAB — MPO/PR-3 (ANCA) ANTIBODIES
ANCA Proteinase 3: <3.5 U/mL (ref 0.0–3.5)
ANCA Proteinase 3: <3.5 U/mL (ref 0.0–3.5)
Myeloperoxidase Abs: <9 U/mL (ref 0.0–9.0)
Myeloperoxidase Abs: <9 U/mL (ref 0.0–9.0)

## 2020-11-20 ENCOUNTER — Other Ambulatory Visit: Payer: Self-pay

## 2020-11-20 ENCOUNTER — Emergency Department
Admission: EM | Admit: 2020-11-20 | Discharge: 2020-11-20 | Disposition: A | Discharge status: Home / Self Care | Payer: Medicare Other | Attending: Emergency Medicine | Admitting: Emergency Medicine

## 2020-11-20 ENCOUNTER — Encounter: Payer: Self-pay | Admitting: Intensive Care

## 2020-11-20 DIAGNOSIS — I10 Essential (primary) hypertension: Secondary | ICD-10-CM

## 2020-11-20 DIAGNOSIS — J449 Chronic obstructive pulmonary disease, unspecified: Secondary | ICD-10-CM | POA: Insufficient documentation

## 2020-11-20 DIAGNOSIS — I129 Hypertensive chronic kidney disease with stage 1 through stage 4 chronic kidney disease, or unspecified chronic kidney disease: Secondary | ICD-10-CM | POA: Insufficient documentation

## 2020-11-20 DIAGNOSIS — Z96652 Presence of left artificial knee joint: Secondary | ICD-10-CM | POA: Insufficient documentation

## 2020-11-20 DIAGNOSIS — I2510 Atherosclerotic heart disease of native coronary artery without angina pectoris: Secondary | ICD-10-CM | POA: Diagnosis not present

## 2020-11-20 DIAGNOSIS — R202 Paresthesia of skin: Secondary | ICD-10-CM | POA: Insufficient documentation

## 2020-11-20 DIAGNOSIS — R350 Frequency of micturition: Secondary | ICD-10-CM | POA: Insufficient documentation

## 2020-11-20 DIAGNOSIS — Z87891 Personal history of nicotine dependence: Secondary | ICD-10-CM | POA: Diagnosis not present

## 2020-11-20 DIAGNOSIS — E1122 Type 2 diabetes mellitus with diabetic chronic kidney disease: Secondary | ICD-10-CM | POA: Diagnosis not present

## 2020-11-20 DIAGNOSIS — Z79899 Other long term (current) drug therapy: Secondary | ICD-10-CM | POA: Insufficient documentation

## 2020-11-20 DIAGNOSIS — Z7982 Long term (current) use of aspirin: Secondary | ICD-10-CM | POA: Insufficient documentation

## 2020-11-20 DIAGNOSIS — N184 Chronic kidney disease, stage 4 (severe): Secondary | ICD-10-CM | POA: Insufficient documentation

## 2020-11-20 LAB — URINALYSIS, COMPLETE (UACMP) WITH MICROSCOPIC
Bacteria, UA: NONE SEEN
Bilirubin Urine: NEGATIVE
Glucose, UA: NEGATIVE mg/dL
Hgb urine dipstick: NEGATIVE
Ketones, ur: NEGATIVE mg/dL
Leukocytes,Ua: NEGATIVE
Nitrite: NEGATIVE
Protein, ur: NEGATIVE mg/dL
Specific Gravity, Urine: 1.002 — ABNORMAL LOW (ref 1.005–1.030)
Squamous Epithelial / HPF: NONE SEEN (ref 0–5)
WBC, UA: NONE SEEN WBC/hpf (ref 0–5)
pH: 7 (ref 5.0–8.0)

## 2020-11-20 LAB — BASIC METABOLIC PANEL
Anion gap: 10 (ref 5–15)
BUN: 18 mg/dL (ref 8–23)
CO2: 23 mmol/L (ref 22–32)
Calcium: 9.5 mg/dL (ref 8.9–10.3)
Chloride: 98 mmol/L (ref 98–111)
Creatinine, Ser: 1.58 mg/dL — ABNORMAL HIGH (ref 0.61–1.24)
GFR, Estimated: 44 mL/min — ABNORMAL LOW (ref >60)
Glucose, Bld: 105 mg/dL — ABNORMAL HIGH (ref 70–99)
Potassium: 4.4 mmol/L (ref 3.5–5.1)
Sodium: 131 mmol/L — ABNORMAL LOW (ref 135–145)

## 2020-11-20 LAB — CBC
HCT: 33.8 % — ABNORMAL LOW (ref 39.0–52.0)
Hemoglobin: 11.4 g/dL — ABNORMAL LOW (ref 13.0–17.0)
MCH: 31.9 pg (ref 26.0–34.0)
MCHC: 33.7 g/dL (ref 30.0–36.0)
MCV: 94.7 fL (ref 80.0–100.0)
Platelets: 356 10*3/uL (ref 150–400)
RBC: 3.57 MIL/uL — ABNORMAL LOW (ref 4.22–5.81)
RDW: 12.7 % (ref 11.5–15.5)
WBC: 6.3 10*3/uL (ref 4.0–10.5)
nRBC: 0 % (ref 0.0–0.2)

## 2020-11-20 NOTE — ED Triage Notes (Signed)
Patient c/o HTN, tingling on toes and fingers, experiencing overwhelming sensations, intermittent blurry vision, and frequent urination. 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 Denies pain

## 2020-11-20 NOTE — ED Notes (Signed)
Pt denies pain at this time, denies urinary urgency. AOX4, NAD. Pt is pleasant and conversational.

## 2020-11-20 NOTE — ED Provider Notes (Signed)
[facility] Emergency Department Provider Note  Time seen: 3:47 PM  I have reviewed the triage vital signs and the nursing notes.   HISTORY  Chief Complaint Hypertension and Urinary Frequency  HPI Tony Mitchell is a 80 y.o. male with a past medical history of arthritis, COPD, gastric reflux, hypertension, hyperlipidemia, chronic kidney disease, presents to the emergency department for evaluation.  According to the patient 2 weeks ago he was admitted to the hospital for deteriorating kidney function and hyperkalemia.  Patient states he was discharged 11/06/2020.  He states he has been logging his blood pressure which has been fluctuating between 130 and 160 which has been concerning him.  Patient states at times he will feel tingling in his fingers and toes also states at times he will have urinary frequency but then at night feels like he is not urinating enough although admits that he is not drinking much fluids at night.  States he has significantly increased amount of fluids he drinks during the daytime.  Patient admits that he has been very "nervous" about his potassium going back up as he was told how dangerous this could be as well as his kidney functioning deteriorating further.  Patient states he went to his doctor this past week but they did not do lab work.  Patient has follow-up with nephrology this week.    Past Medical History:  Diagnosis Date  . Acute appendicitis 03/03/2018  . Arthritis   . COPD (chronic obstructive pulmonary disease) ([place])   . Coronary artery disease   . Difficult intubation   . GERD (gastroesophageal reflux disease)   . Gout   . Hyperlipidemia   . Hypertension   . Sleep apnea     Patient Active Problem List   Diagnosis Date Noted  . 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   . Type 2 diabetes mellitus with hyperlipidemia ([place])   . Hyperkalemia   . Diarrhea   . Anemia of chronic disease   . AKI (acute kidney  injury) ([place]) 11/03/2020  . Status post total knee replacement using cement, left 09/16/2018  . History of pneumothorax 11/07/2017  . Pneumothorax 09/18/2017  . Thoracic aortic aneurysm without rupture ([place]) 08/16/2017  . Primary osteoarthritis of right knee 07/10/2017  . Spinal stenosis of lumbar region with radiculopathy 07/10/2017  . GERD (gastroesophageal reflux disease) 06/13/2016  . Gout 06/13/2016  . Hypertriglyceridemia 06/13/2016  . Essential hypertension 06/13/2016  . Pneumothorax on right 06/03/2016  . Elevated PSA 03/29/2016  . Elevated blood sugar 09/29/2015  . Rash 02/17/2015  . OSA (obstructive sleep apnea) 07/08/2014  . S/P cardiac catheterization 05/28/2014  . SOB (shortness of breath) on exertion 05/28/2014    Past Surgical History:  Procedure Laterality Date  . CATARACT EXTRACTION  2012  . KNEE SURGERY Left 1965  . LAPAROSCOPIC APPENDECTOMY N/A 03/03/2018   Procedure: APPENDECTOMY LAPAROSCOPIC;  Surgeon: Vickie Epley, MD;  Location: [facility] ORS;  Service: General;  Laterality: N/A;  . TOTAL KNEE ARTHROPLASTY Left 09/16/2018   Procedure: TOTAL KNEE ARTHROPLASTY;  Surgeon: Corky Mull, MD;  Location: [facility] ORS;  Service: Orthopedics;  Laterality: Left;    Prior to Admission medications   Medication Sig Start Date End Date Taking? Authorizing Provider  allopurinol (ZYLOPRIM) 100 MG tablet Take 100 mg by mouth daily.  06/17/17   [provider]  ALPRAZolam Duanne Moron) 0.5 MG tablet Take 0.5 mg by mouth at bedtime. 09/21/20   [provider]  amLODipine ([place])  2.5 MG tablet Take 2.5 mg by mouth daily. 04/08/20   [provider]  aspirin EC 81 MG tablet Take 81 mg by mouth daily.    [provider]  fenofibrate (TRICOR) 145 MG tablet Take 145 mg by mouth daily. 02/25/20   [provider]  hydrALAZINE (APRESOLINE) 25 MG tablet Take 25 mg by mouth 2 (two) times daily.    [provider]  Javier Docker Oil 1000 MG CAPS Take  1,000 mg by mouth daily.    [provider]  lansoprazole (PREVACID) 15 MG capsule Take 15 mg by mouth every evening.    [provider]  simvastatin (ZOCOR) 20 MG tablet Take 20 mg by mouth at bedtime.  04/01/18   [provider]  SYMBICORT 80-4.5 MCG/ACT inhaler Inhale 2 puffs into the lungs 2 (two) times daily. 09/11/17   [provider]  vitamin B-12 (CYANOCOBALAMIN) 1000 MCG tablet Take 1,000 mcg by mouth daily.    [provider]    No Known Allergies  Family History  Problem Relation Age of Onset  . Cancer Father        Unknown  . Prostate cancer Neg Hx   . Lung cancer Neg Hx     Social History Social History   Tobacco Use  . Smoking status: Former Smoker    Types: Cigarettes    Quit date: 06/15/1996    Years since quitting: 24.4  . Smokeless tobacco: Never Used  Vaping Use  . Vaping Use: Never used  Substance Use Topics  . Alcohol use: Yes    Alcohol/week: 21.0 standard drinks    Types: 21 Shots of liquor per week    Comment: reports last drink 11/03/20  . Drug use: No    Review of Systems Constitutional: Negative for fever. Cardiovascular: Negative for chest pain. Respiratory: Negative for shortness of breath. Gastrointestinal: Negative for abdominal pain, vomiting and diarrhea. Genitourinary: Decreased urination at night.  Increased urination during the day. Musculoskeletal: Negative for musculoskeletal complaints Skin: Negative for skin complaints  Neurological: Negative for headache.  Tingling in his toes and fingers at times. All other ROS negative  ____________________________________________   PHYSICAL EXAM:  VITAL SIGNS: ED Triage Vitals  Enc Vitals Group     BP 11/20/20 1306 (!) 148/76     Pulse Rate 11/20/20 1306 80     Resp 11/20/20 1306 20     Temp 11/20/20 1306 97.8 F (36.6 C)     Temp Source 11/20/20 1306 Oral     SpO2 11/20/20 1306 98 %     Weight 11/20/20 1306 190 lb 9.6 oz (86.5 kg)      Height 11/20/20 1306 5\' 8"  (1.727 m)     Head Circumference --      Peak Flow --      Pain Score 11/20/20 1333 0     Pain Loc --      Pain Edu? --      Excl. in [place]? --    Constitutional: Alert and oriented. Well appearing and in no distress. Eyes: Normal exam ENT      Head: Normocephalic and atraumatic.      Mouth/Throat: Mucous membranes are moist. Cardiovascular: Normal rate, regular rhythm.  Respiratory: Normal respiratory effort without tachypnea nor retractions. Breath sounds are clear  Gastrointestinal: Soft and nontender. No distention.  Musculoskeletal: Nontender with normal range of motion in all extremities. No lower extremity tenderness or edema. Neurologic:  Normal speech and language. No gross focal  neurologic deficits Skin:  Skin is warm, dry and intact.  Psychiatric: Mood and affect are normal.  ____________________________________________    EKG  EKG viewed and interpreted by myself shows normal sinus rhythm at 72 bpm with a narrow QRS, normal axis, normal intervals, no concerning ST changes.  ____________________________________________   INITIAL IMPRESSION / ASSESSMENT AND PLAN / ED COURSE  Pertinent labs & imaging results that were available during my care of the patient were reviewed by me and considered in my medical decision making (see chart for details).   Patient presents emergency department with various complaints including tingling in fingers and toes, blurry vision, fluctuations in blood pressure.  Patient states his main concern is making sure that his kidneys are okay that his potassium is not elevated.  States he has been researching his symptoms on the Internet and thought they could be due to an elevated potassium.  Reassuringly patient's lab work looks great compared to 2 weeks ago.  Creatinine was greater than 4 at his admission currently 1.58.  GFR was 11 upon admission currently 44 potassium was 6.1 which is currently 4.4.  Patient is very  reassured by these values.  EKG is reassuring.  Patient admits that he feels very anxious about this and just wanted to be sure.  Given the patient's reassuring work-up reassuring vitals and reassuring lab work I believe the patient is safe for discharge home with PCP and nephrology follow-up.  Patient agreeable to plan of care.  Halley Kincer Curfman was evaluated in Emergency Department on 11/20/2020 for the symptoms described in the history of present illness. He was evaluated in the context of the global COVID-19 pandemic, which necessitated consideration that the patient might be at risk for infection with the SARS-CoV-2 virus that causes COVID-19. Institutional protocols and algorithms that pertain to the evaluation of patients at risk for COVID-19 are in a state of rapid change based on information released by regulatory bodies including the CDC and federal and state organizations. These policies and algorithms were followed during the patient's care in the ED.  ____________________________________________   FINAL CLINICAL IMPRESSION(S) / ED DIAGNOSES  Paresthesias Hypertension   Harvest Dark, MD 11/20/20 1551

## 2020-11-20 NOTE — ED Notes (Signed)
Pt given urinal for specimen collection, pt verbalizes understanding and states he will hit call bell when he has given specimen.

## 2020-12-19 ENCOUNTER — Other Ambulatory Visit: Payer: Self-pay | Admitting: Internal Medicine

## 2020-12-19 DIAGNOSIS — R7989 Other specified abnormal findings of blood chemistry: Secondary | ICD-10-CM

## 2020-12-20 ENCOUNTER — Ambulatory Visit
Admission: RE | Admit: 2020-12-20 | Discharge: 2020-12-20 | Disposition: A | Discharge status: Home / Self Care | Payer: Medicare Other | Source: Ambulatory Visit | Attending: Internal Medicine | Admitting: Internal Medicine

## 2020-12-20 ENCOUNTER — Other Ambulatory Visit: Payer: Self-pay

## 2020-12-20 DIAGNOSIS — R7989 Other specified abnormal findings of blood chemistry: Secondary | ICD-10-CM | POA: Diagnosis present

## 2021-03-02 ENCOUNTER — Other Ambulatory Visit (HOSPITAL_COMMUNITY): Payer: Self-pay | Admitting: Internal Medicine

## 2021-03-02 ENCOUNTER — Other Ambulatory Visit: Payer: Self-pay | Admitting: Internal Medicine

## 2021-03-02 DIAGNOSIS — R634 Abnormal weight loss: Secondary | ICD-10-CM

## 2021-03-07 ENCOUNTER — Other Ambulatory Visit: Payer: Self-pay

## 2021-03-07 ENCOUNTER — Ambulatory Visit
Admission: EM | Admit: 2021-03-07 | Discharge: 2021-03-07 | Disposition: A | Discharge status: Home / Self Care | Payer: Medicare Other | Attending: Family Medicine | Admitting: Family Medicine

## 2021-03-07 DIAGNOSIS — Z96652 Presence of left artificial knee joint: Secondary | ICD-10-CM | POA: Diagnosis not present

## 2021-03-07 DIAGNOSIS — Z20822 Contact with and (suspected) exposure to covid-19: Secondary | ICD-10-CM | POA: Diagnosis not present

## 2021-03-07 DIAGNOSIS — Z79899 Other long term (current) drug therapy: Secondary | ICD-10-CM | POA: Insufficient documentation

## 2021-03-07 DIAGNOSIS — Z7951 Long term (current) use of inhaled steroids: Secondary | ICD-10-CM | POA: Insufficient documentation

## 2021-03-07 DIAGNOSIS — J441 Chronic obstructive pulmonary disease with (acute) exacerbation: Secondary | ICD-10-CM | POA: Insufficient documentation

## 2021-03-07 DIAGNOSIS — Z87891 Personal history of nicotine dependence: Secondary | ICD-10-CM | POA: Insufficient documentation

## 2021-03-07 LAB — RESP PANEL BY RT-PCR (FLU A&B, COVID) ARPGX2
Influenza A by PCR: NEGATIVE
Influenza B by PCR: NEGATIVE
SARS Coronavirus 2 by RT PCR: NEGATIVE

## 2021-03-07 MED ORDER — DOXYCYCLINE HYCLATE 100 MG PO CAPS: 100.0000 mg | ORAL_CAPSULE | Freq: Two times a day (BID) | ORAL | 0 refills | Status: DC

## 2021-03-07 MED ORDER — PREDNISONE 50 MG PO TABS: ORAL_TABLET | ORAL | 0 refills | Status: DC

## 2021-03-07 NOTE — Discharge Instructions (Addendum)
I will call with the results and will discuss treatment options at that time.  Take care  Dr. Lacinda Axon

## 2021-03-07 NOTE — ED Triage Notes (Signed)
Pt c/o runny nose, cough, watery eyes, congestion. Since yesterday. No fever

## 2021-03-07 NOTE — ED Provider Notes (Signed)
[facility]    CSN: [id number] Arrival date & time: 03/07/21  5916      History   Chief Complaint Respiratory symptoms  HPI  80 year old male presents with respiratory symptoms.  Patient has known COPD.  Symptoms started last night.  He reports runny nose, cough, sneezing, congestion.  Denies shortness of breath.  Cough is productive of green sputum.  No fever.  No relieving factors.  No reported sick contacts.  He took a home COVID test and it was negative.  No reports of body aches.  He endorses compliance with home medication.  No other associated symptoms.  No other complaints.  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   History of pneumothorax 11/07/2017   Pneumothorax 09/18/2017   Thoracic aortic aneurysm without rupture ([place]) 08/16/2017   Primary osteoarthritis of right knee 07/10/2017   Spinal stenosis of lumbar region with radiculopathy 07/10/2017   GERD (gastroesophageal reflux disease) 06/13/2016   Gout 06/13/2016   Hypertriglyceridemia 06/13/2016   Essential hypertension 06/13/2016   Pneumothorax on right 06/03/2016   Elevated PSA 03/29/2016   Elevated blood sugar 09/29/2015   Rash 02/17/2015   OSA (obstructive sleep apnea) 07/08/2014   S/P cardiac catheterization 05/28/2014   SOB (shortness of breath) on exertion 05/28/2014    Past Surgical History:  Procedure  Laterality Date   CATARACT EXTRACTION  2012   KNEE SURGERY Left 1965   LAPAROSCOPIC APPENDECTOMY N/A 03/03/2018   Procedure: APPENDECTOMY LAPAROSCOPIC;  Surgeon: Vickie Epley, MD;  Location: [facility] ORS;  Service: General;  Laterality: N/A;   TOTAL KNEE ARTHROPLASTY Left 09/16/2018   Procedure: TOTAL KNEE ARTHROPLASTY;  Surgeon: Corky Mull, MD;  Location: [facility] ORS;  Service: Orthopedics;  Laterality: Left;       Home Medications    Prior to Admission medications   Medication Sig Start Date End Date Taking? Authorizing Provider  allopurinol (ZYLOPRIM) 100 MG tablet Take 100 mg by mouth daily.  06/17/17  Yes [provider]  ALPRAZolam Duanne Moron) 0.5 MG tablet Take 0.5 mg by mouth at bedtime. 09/21/20  Yes [provider]  amLODipine (NORVASC) 2.5 MG tablet Take 2.5 mg by mouth daily. 04/08/20  Yes [provider]  aspirin EC 81 MG tablet Take 81 mg by mouth daily.   Yes [provider]  doxycycline (VIBRAMYCIN) 100 MG capsule Take 1 capsule (100 mg total) by mouth 2 (two) times daily. 03/07/21  Yes Remy Voiles G, DO  fenofibrate (TRICOR) 145 MG tablet Take 145 mg by mouth daily. 02/25/20  Yes [provider]  hydrALAZINE (APRESOLINE) 25 MG tablet Take 25 mg by mouth 2 (two) times daily.   Yes [provider]  Javier Docker Oil 1000 MG CAPS Take 1,000 mg by mouth daily.   Yes [provider]  lansoprazole (  PREVACID) 15 MG capsule Take 15 mg by mouth every evening.   Yes [provider]  predniSONE (DELTASONE) 50 MG tablet 1 tablet daily x 5 days 03/07/21  Yes Antonette Hendricks G, DO  simvastatin (ZOCOR) 20 MG tablet Take 20 mg by mouth at bedtime.  04/01/18  Yes [provider]  SYMBICORT 80-4.5 MCG/ACT inhaler Inhale 2 puffs into the lungs 2 (two) times daily. 09/11/17  Yes [provider]  vitamin B-12 (CYANOCOBALAMIN) 1000 MCG tablet Take 1,000 mcg by mouth daily.   Yes [provider]    Family  History Family History  Problem Relation Age of Onset   Cancer Father        Unknown   Prostate cancer Neg Hx    Lung cancer Neg Hx     Social History Social History   Tobacco Use   Smoking status: Former    Types: Cigarettes    Quit date: 06/15/1996    Years since quitting: 24.7   Smokeless tobacco: Never  Vaping Use   Vaping Use: Never used  Substance Use Topics   Alcohol use: Yes    Alcohol/week: 21.0 standard drinks    Types: 21 Shots of liquor per week    Comment: reports last drink 11/03/20   Drug use: No     Allergies   Patient has no known allergies.   Review of Systems Review of Systems Per HPI  Physical Exam Triage Vital Signs ED Triage Vitals  Enc Vitals Group     BP 03/07/21 1007 (!) 147/97     Pulse Rate 03/07/21 1007 74     Resp 03/07/21 1007 16     Temp 03/07/21 1007 98.3 F (36.8 C)     Temp Source 03/07/21 1007 Oral     SpO2 03/07/21 1007 100 %     Weight 03/07/21 1006 157 lb (71.2 kg)     Height --      Head Circumference --      Peak Flow --      Pain Score 03/07/21 1006 0     Pain Loc --      Pain Edu? --      Excl. in [place]? --    Updated Vital Signs BP (!) 147/97 (BP Location: Left Arm)   Pulse 74   Temp 98.3 F (36.8 C) (Oral)   Resp 16   Wt 71.2 kg   SpO2 100%   BMI 23.87 kg/m   Visual Acuity Right Eye Distance:   Left Eye Distance:   Bilateral Distance:    Right Eye Near:   Left Eye Near:    Bilateral Near:     Physical Exam Constitutional:      General: He is not in acute distress.    Appearance: Normal appearance. He is not ill-appearing.  HENT:     Head: Normocephalic and atraumatic.     Right Ear: Tympanic membrane normal.     Left Ear: Tympanic membrane normal.     Mouth/Throat:     Pharynx: Oropharynx is clear. No oropharyngeal exudate or posterior oropharyngeal erythema.  Eyes:     General:        Right eye: No discharge.        Left eye: No discharge.  Cardiovascular:     Rate and Rhythm: Normal  rate and regular rhythm.     Heart sounds: No murmur heard. Pulmonary:     Effort: Pulmonary effort is normal.     Breath  sounds: No wheezing or rales.  Neurological:     Mental Status: He is alert.  Psychiatric:        Mood and Affect: Mood normal.        Behavior: Behavior normal.     UC Treatments / Results  Labs (all labs ordered are listed, but only abnormal results are displayed) Labs Reviewed  RESP PANEL BY RT-PCR (FLU A&B, COVID) [id number]    EKG   Radiology No results found.  Procedures Procedures (including critical care time)  Medications Ordered in UC Medications - No data to display  Initial Impression / Assessment and Plan / UC Course  I have reviewed the triage vital signs and the nursing notes.  Pertinent labs & imaging results that were available during my care of the patient were reviewed by me and considered in my medical decision making (see chart for details).    80 year old male presents with respiratory symptoms.  See above.  COVID testing negative.  Has known COPD.  Treating for COPD exacerbation with doxycycline and prednisone.  Final Clinical Impressions(s) / UC Diagnoses   Final diagnoses:  COPD exacerbation [facility])     Discharge Instructions      I will call with the results and will discuss treatment options at that time.  Take care  Dr. Lacinda Axon      ED Prescriptions     Medication Sig Dispense Auth. Provider   doxycycline (VIBRAMYCIN) 100 MG capsule Take 1 capsule (100 mg total) by mouth 2 (two) times daily. 14 capsule Mackey Varricchio G, DO   predniSONE (DELTASONE) 50 MG tablet 1 tablet daily x 5 days 5 tablet Thersa Salt G, DO      PDMP not reviewed this encounter.   Coral Spikes, [place] 03/07/21 1242

## 2021-03-09 ENCOUNTER — Other Ambulatory Visit: Payer: Self-pay

## 2021-03-09 ENCOUNTER — Ambulatory Visit
Admission: RE | Admit: 2021-03-09 | Discharge: 2021-03-09 | Disposition: A | Discharge status: Home / Self Care | Payer: Medicare Other | Source: Ambulatory Visit | Attending: Internal Medicine | Admitting: Internal Medicine

## 2021-03-09 DIAGNOSIS — R634 Abnormal weight loss: Secondary | ICD-10-CM

## 2021-03-09 HISTORY — DX: Disorder of kidney and ureter, unspecified: N28.9

## 2021-03-09 LAB — POCT I-STAT CREATININE: Creatinine, Ser: 1.2 mg/dL (ref 0.61–1.24)

## 2021-03-09 MED ORDER — IOHEXOL 300 MG/ML  SOLN
100.0000 mL | Freq: Once | INTRAMUSCULAR | Status: AC | PRN
  Administered 2021-03-09: 80 mL via INTRAVENOUS

## 2021-04-13 ENCOUNTER — Encounter: Payer: Self-pay | Admitting: *Deleted

## 2021-04-14 ENCOUNTER — Encounter
Admission: RE | Disposition: A | Discharge status: Home / Self Care | Payer: Self-pay | Source: Home / Self Care | Attending: Gastroenterology

## 2021-04-14 ENCOUNTER — Encounter: Payer: Self-pay | Admitting: *Deleted

## 2021-04-14 ENCOUNTER — Ambulatory Visit
Admission: RE | Admit: 2021-04-14 | Discharge: 2021-04-14 | Disposition: A | Discharge status: Home / Self Care | Payer: Medicare Other | Attending: Gastroenterology | Admitting: Gastroenterology

## 2021-04-14 ENCOUNTER — Ambulatory Visit: Payer: Medicare Other | Admitting: Anesthesiology

## 2021-04-14 DIAGNOSIS — E785 Hyperlipidemia, unspecified: Secondary | ICD-10-CM | POA: Diagnosis not present

## 2021-04-14 DIAGNOSIS — K640 First degree hemorrhoids: Secondary | ICD-10-CM | POA: Insufficient documentation

## 2021-04-14 DIAGNOSIS — Z9049 Acquired absence of other specified parts of digestive tract: Secondary | ICD-10-CM | POA: Diagnosis not present

## 2021-04-14 DIAGNOSIS — R634 Abnormal weight loss: Secondary | ICD-10-CM | POA: Insufficient documentation

## 2021-04-14 DIAGNOSIS — K317 Polyp of stomach and duodenum: Secondary | ICD-10-CM | POA: Insufficient documentation

## 2021-04-14 DIAGNOSIS — I10 Essential (primary) hypertension: Secondary | ICD-10-CM | POA: Diagnosis not present

## 2021-04-14 DIAGNOSIS — K219 Gastro-esophageal reflux disease without esophagitis: Secondary | ICD-10-CM | POA: Insufficient documentation

## 2021-04-14 DIAGNOSIS — Z7984 Long term (current) use of oral hypoglycemic drugs: Secondary | ICD-10-CM | POA: Diagnosis not present

## 2021-04-14 DIAGNOSIS — Z7982 Long term (current) use of aspirin: Secondary | ICD-10-CM | POA: Diagnosis not present

## 2021-04-14 DIAGNOSIS — G4730 Sleep apnea, unspecified: Secondary | ICD-10-CM | POA: Insufficient documentation

## 2021-04-14 DIAGNOSIS — R933 Abnormal findings on diagnostic imaging of other parts of digestive tract: Secondary | ICD-10-CM | POA: Insufficient documentation

## 2021-04-14 DIAGNOSIS — Z7951 Long term (current) use of inhaled steroids: Secondary | ICD-10-CM | POA: Insufficient documentation

## 2021-04-14 DIAGNOSIS — I2510 Atherosclerotic heart disease of native coronary artery without angina pectoris: Secondary | ICD-10-CM | POA: Insufficient documentation

## 2021-04-14 DIAGNOSIS — Z79899 Other long term (current) drug therapy: Secondary | ICD-10-CM | POA: Diagnosis not present

## 2021-04-14 DIAGNOSIS — K2970 Gastritis, unspecified, without bleeding: Secondary | ICD-10-CM | POA: Insufficient documentation

## 2021-04-14 DIAGNOSIS — J449 Chronic obstructive pulmonary disease, unspecified: Secondary | ICD-10-CM | POA: Insufficient documentation

## 2021-04-14 DIAGNOSIS — E119 Type 2 diabetes mellitus without complications: Secondary | ICD-10-CM | POA: Diagnosis not present

## 2021-04-14 HISTORY — DX: Deficiency of other specified B group vitamins: E53.8

## 2021-04-14 HISTORY — PX: COLONOSCOPY WITH PROPOFOL: SHX5780

## 2021-04-14 HISTORY — DX: Other pulmonary collapse: J98.19

## 2021-04-14 HISTORY — PX: ESOPHAGOGASTRODUODENOSCOPY (EGD) WITH PROPOFOL: SHX5813

## 2021-04-14 SURGERY — COLONOSCOPY WITH PROPOFOL
Anesthesia: General

## 2021-04-14 MED ORDER — PROPOFOL 500 MG/50ML IV EMUL
INTRAVENOUS | Status: DC | PRN
  Administered 2021-04-14: 150 ug/kg/min via INTRAVENOUS

## 2021-04-14 MED ORDER — PROPOFOL 10 MG/ML IV BOLUS
INTRAVENOUS | Status: DC | PRN
  Administered 2021-04-14: 50 mg via INTRAVENOUS

## 2021-04-14 MED ORDER — SODIUM CHLORIDE 0.9 % IV SOLN
INTRAVENOUS | Status: DC
  Administered 2021-04-14: 1000 mL via INTRAVENOUS

## 2021-04-14 NOTE — Interval H&P Note (Signed)
History and Physical Interval Note:  04/14/2021 10:12 AM  Tony Mitchell  has presented today for surgery, with the diagnosis of Abnormal CT of Abdomen (R93.3) Wt Loss  (R63.4)  Family History COlon Cancer (Z80.0).  The various methods of treatment have been discussed with the patient and family. After consideration of risks, benefits and other options for treatment, the patient has consented to  Procedure(s): COLONOSCOPY WITH PROPOFOL (N/A) ESOPHAGOGASTRODUODENOSCOPY (EGD) WITH PROPOFOL (N/A) as a surgical intervention.  The patient's history has been reviewed, patient examined, no change in status, stable for surgery.  I have reviewed the patient's chart and labs.  Questions were answered to the patient's satisfaction.     Lesly Rubenstein  Ok to proceed with EGD/Colonoscopy

## 2021-04-14 NOTE — Transfer of Care (Signed)
Immediate Anesthesia Transfer of Care Note  Patient: Tony Mitchell  Procedure(s) Performed: COLONOSCOPY WITH PROPOFOL ESOPHAGOGASTRODUODENOSCOPY (EGD) WITH PROPOFOL  Patient Location: PACU  Anesthesia Type:General  Level of Consciousness: awake, alert  and oriented  Airway & Oxygen Therapy: Patient Spontanous Breathing and Patient connected to nasal cannula oxygen  Post-op Assessment: Report given to RN and Post -op Vital signs reviewed and stable  Post vital signs: Reviewed and stable  Last Vitals:  Vitals Value Taken Time  BP    Temp    Pulse 73 04/14/21 1045  Resp 17 04/14/21 1045  SpO2 99 % 04/14/21 1045  Vitals shown include unvalidated device data.  Last Pain:  Vitals:   04/14/21 0948  TempSrc: Temporal  PainSc: 0-No pain         Complications: No notable events documented.

## 2021-04-14 NOTE — Anesthesia Preprocedure Evaluation (Signed)
Anesthesia Evaluation  Patient identified by MRN, date of birth, ID band Patient awake    Reviewed: Allergy & Precautions, H&P , NPO status , Patient's Chart, lab work & pertinent test results, reviewed documented beta blocker date and time   History of Anesthesia Complications (+) DIFFICULT AIRWAY and history of anesthetic complications  Airway Mallampati: III  TM Distance: <3 FB Neck ROM: limited    Dental  (+) Chipped, Poor Dentition, Missing   Pulmonary neg shortness of breath, sleep apnea and Continuous Positive Airway Pressure Ventilation , COPD,  COPD inhaler, former smoker,           Cardiovascular Exercise Tolerance: Good hypertension, Pt. on medications and Pt. on home beta blockers (-) angina+ CAD and + Peripheral Vascular Disease  (-) Past MI, (-) Cardiac Stents and (-) CABG (-) dysrhythmias + Valvular Problems/Murmurs AI and MR      Neuro/Psych  Neuromuscular disease negative psych ROS   GI/Hepatic Neg liver ROS, GERD  Medicated and Controlled,  Endo/Other  diabetes  Renal/GU Renal disease     Musculoskeletal  (+) Arthritis ,   Abdominal   Peds  Hematology negative hematology ROS (+) anemia ,   Anesthesia Other Findings Past Medical History: No date: COPD (chronic obstructive pulmonary disease) ([facility]) No date: Coronary artery disease No date: Gout No date: Hyperlipidemia No date: Hypertension No date: Sleep apnea  Past Surgical History: 2012: CATARACT EXTRACTION 1965: KNEE SURGERY; Left  BMI    Body Mass Index:  28.06 kg/m      Reproductive/Obstetrics negative OB ROS                             Anesthesia Physical  Anesthesia Plan  ASA: 3  Anesthesia Plan: General   Post-op Pain Management:    Induction: Intravenous  PONV Risk Score and Plan: Propofol infusion and TIVA  Airway Management Planned: Natural Airway and Nasal Cannula  Additional Equipment:    Intra-op Plan:   Post-operative Plan:   Informed Consent: I have reviewed the patients History and Physical, chart, labs and discussed the procedure including the risks, benefits and alternatives for the proposed anesthesia with the patient or authorized representative who has indicated his/her understanding and acceptance.     Dental Advisory Given  Plan Discussed with: Anesthesiologist, CRNA and Surgeon  Anesthesia Plan Comments: (Patient consented for risks of anesthesia including but not limited to:  - adverse reactions to medications - damage to teeth, lips or other oral mucosa - sore throat or hoarseness - Damage to heart, brain, lungs or loss of life  Patient voiced understanding.)       Anesthesia Quick Evaluation

## 2021-04-14 NOTE — Anesthesia Postprocedure Evaluation (Signed)
Anesthesia Post Note  Patient: Thuan Tippett [place]  Procedure(s) Performed: COLONOSCOPY WITH PROPOFOL ESOPHAGOGASTRODUODENOSCOPY (EGD) WITH PROPOFOL  Patient location during evaluation: Phase II Anesthesia Type: General Level of consciousness: awake and alert, awake and oriented Pain management: pain level controlled Vital Signs Assessment: post-procedure vital signs reviewed and stable Respiratory status: spontaneous breathing, nonlabored ventilation and respiratory function stable Cardiovascular status: blood pressure returned to baseline and stable Postop Assessment: no apparent nausea or vomiting Anesthetic complications: no   No notable events documented.   Last Vitals:  Vitals:   04/14/21 1057 04/14/21 1107  BP: 125/66 140/68  Pulse: 62 64  Resp: 11 15  Temp:    SpO2: 100% 100%    Last Pain:  Vitals:   04/14/21 1048  TempSrc: Temporal  PainSc:                  Phill Mutter

## 2021-04-14 NOTE — H&P (Signed)
Outpatient short stay form Pre-procedure 04/14/2021  Lesly Rubenstein, MD  Primary Physician: Tracie Harrier, MD  Reason for visit:  Weight loss/Abnormal imaging  History of present illness:   80 y/o gentleman with history of hypertension, gout,  and DM II here for EGD/Colonoscopy for weight loss of 50 pounds and abnormal imaging of GI tract showing gastric thickening. Thinks his father had colon cancer but he isn't sure. No blood thinners. Hx of cholecystectomy.   Current Facility-Administered Medications:    0.9 %  sodium chloride infusion, , Intravenous, Continuous, Ephrem Carrick, Hilton Cork, MD, Last Rate: 20 mL/hr at 04/14/21 1006, 1,000 mL at 04/14/21 1006  Medications Prior to Admission  Medication Sig Dispense Refill Last Dose   albuterol (VENTOLIN HFA) 108 (90 Base) MCG/ACT inhaler Inhale into the lungs every 6 (six) hours as needed for wheezing or shortness of breath.    at prn   allopurinol (ZYLOPRIM) 100 MG tablet Take 100 mg by mouth daily.    04/13/2021   ALPRAZolam (XANAX) 0.5 MG tablet Take 0.5 mg by mouth at bedtime.   04/13/2021   amLODipine (NORVASC) 2.5 MG tablet Take 2.5 mg by mouth daily.   04/14/2021 at 0500   aspirin EC 81 MG tablet Take 81 mg by mouth daily.   04/13/2021   colchicine 0.6 MG tablet Take 0.6 mg by mouth daily.   04/13/2021   fenofibrate (TRICOR) 145 MG tablet Take 145 mg by mouth daily.   04/14/2021 at 0500   hydrALAZINE (APRESOLINE) 25 MG tablet Take 25 mg by mouth 2 (two) times daily.   04/14/2021 at 0500   Krill Oil 1000 MG CAPS Take 1,000 mg by mouth daily.   Past Week   lisinopril (ZESTRIL) 30 MG tablet Take 30 mg by mouth daily.   04/13/2021   sildenafil (REVATIO) 20 MG tablet Take 20 mg by mouth 3 (three) times daily.      simvastatin (ZOCOR) 20 MG tablet Take 20 mg by mouth at bedtime.    04/13/2021   simvastatin (ZOCOR) 20 MG tablet Take 20 mg by mouth daily.      SYMBICORT 80-4.5 MCG/ACT inhaler Inhale 2 puffs into the lungs 2 (two) times daily.  5  04/14/2021 at 0500   vitamin B-12 (CYANOCOBALAMIN) 1000 MCG tablet Take 1,000 mcg by mouth daily.   Past Week   doxycycline (VIBRAMYCIN) 100 MG capsule Take 1 capsule (100 mg total) by mouth 2 (two) times daily. (Patient not taking: Reported on 04/14/2021) 14 capsule 0 Completed Course   gabapentin (NEURONTIN) 300 MG capsule Take 300 mg by mouth 3 (three) times daily. (Patient not taking: Reported on 04/14/2021)   Not Taking   labetalol (NORMODYNE) 300 MG tablet Take 300 mg by mouth 2 (two) times daily. (Patient not taking: Reported on 04/14/2021)   Not Taking   lansoprazole (PREVACID) 15 MG capsule Take 15 mg by mouth every evening.      metFORMIN (GLUCOPHAGE) 500 MG tablet Take by mouth 2 (two) times daily with a meal. (Patient not taking: Reported on 04/14/2021)   Not Taking   predniSONE (DELTASONE) 50 MG tablet 1 tablet daily x 5 days (Patient not taking: Reported on 04/14/2021) 5 tablet 0 Completed Course     No Known Allergies   Past Medical History:  Diagnosis Date   Acute appendicitis 03/03/2018   Arthritis    Collapsed lung    COPD (chronic obstructive pulmonary disease) (HCC)    Coronary artery disease    Difficult intubation  GERD (gastroesophageal reflux disease)    Gout    Hyperlipidemia    Hypertension    Renal insufficiency    Sleep apnea    Vitamin B 12 deficiency     Review of systems:  Otherwise negative.    Physical Exam  Gen: Alert, oriented. Appears stated age.  HEENT: PERRLA. Lungs: No respiratory distress CV: RRR Abd: soft, benign, no masses Ext: No edema    Planned procedures: Proceed with EGD/colonoscopy. The patient understands the nature of the planned procedure, indications, risks, alternatives and potential complications including but not limited to bleeding, infection, perforation, damage to internal organs and possible oversedation/side effects from anesthesia. The patient agrees and gives consent to proceed.  Please refer to procedure notes  for findings, recommendations and patient disposition/instructions.     Lesly Rubenstein, MD [facility]

## 2021-04-14 NOTE — Op Note (Signed)
[facility]enter[facility] Patient Name: Tony Mitchell Procedure Date: 04/14/2021 10:06 AM MRN: [id number] Account #: [id number] Date of Birth: 30-Nov-1940 Admit Type: Outpatient Age: 80 Room: [facility] ENDO ROOM 3 Gender: Male Note Status: Finalized Procedure:             Upper GI endoscopy Indications:           Abnormal CT of the GI tract, Weight loss Providers:             Andrey Farmer MD, MD Referring MD:          Tracie Harrier, MD (Referring MD) Medicines:             Monitored Anesthesia Care Complications:         No immediate complications. Estimated blood loss:                         Minimal. Procedure:             Pre-Anesthesia Assessment:                        - Prior to the procedure, a History and Physical was                         performed, and patient medications and allergies were                         reviewed. The patient is competent. The risks and                         benefits of the procedure and the sedation options and                         risks were discussed with the patient. All questions                         were answered and informed consent was obtained.                         Patient identification and proposed procedure were                         verified by the physician, the nurse, the anesthetist                         and the technician in the endoscopy suite. Mental                         Status Examination: alert and oriented. Airway                         Examination: normal oropharyngeal airway and neck                         mobility. Respiratory Examination: clear to                         auscultation. CV Examination: normal. Prophylactic  Antibiotics: The patient does not require prophylactic                         antibiotics. Prior Anticoagulants: The patient has                         taken no previous anticoagulant or antiplatelet                         agents.  ASA Grade Assessment: II - A patient with mild                         systemic disease. After reviewing the risks and                         benefits, the patient was deemed in satisfactory                         condition to undergo the procedure. The anesthesia                         plan was to use monitored anesthesia care (MAC).                         Immediately prior to administration of medications,                         the patient was re-assessed for adequacy to receive                         sedatives. The heart rate, respiratory rate, oxygen                         saturations, blood pressure, adequacy of pulmonary                         ventilation, and response to care were monitored                         throughout the procedure. The physical status of the                         patient was re-assessed after the procedure.                        After obtaining informed consent, the endoscope was                         passed under direct vision. Throughout the procedure,                         the patient's blood pressure, pulse, and oxygen                         saturations were monitored continuously. The Endoscope                         was introduced through the mouth, and advanced to the  second part of duodenum. The upper GI endoscopy was                         accomplished without difficulty. The patient tolerated                         the procedure well. Findings:      The examined esophagus was normal.      Thick gastric folds were found in the gastric body. Biopsies were taken       with a cold forceps for histology. Estimated blood loss was minimal.      The exam of the stomach was otherwise normal.      The examined duodenum was normal. Impression:            - Normal esophagus.                        - Enlarged gastric folds. Biopsied.                        - Normal examined duodenum. Recommendation:        -  Discharge patient to home.                        - Resume previous diet.                        - Continue present medications.                        - Await pathology results.                        - Return to referring physician as previously                         scheduled. Procedure Code(s):     --- Professional ---                        [phone], Esophagogastroduodenoscopy, flexible,                         transoral; with biopsy, single or multiple Diagnosis Code(s):     --- Professional ---                        K29.60, Other gastritis without bleeding                        R63.4, Abnormal weight loss                        R93.3, Abnormal findings on diagnostic imaging of                         other parts of digestive tract CPT copyright 2019 [employer]. All rights reserved. The codes documented in this report are preliminary and upon coder review may  be revised to meet current compliance requirements. Andrey Farmer MD, MD 04/14/2021 10:45:33 AM Number of Addenda: 0 Note Initiated On: 04/14/2021 10:06 AM Estimated Blood Loss:  Estimated blood loss was minimal.  [facility]

## 2021-04-14 NOTE — Op Note (Signed)
[facility] Gastroenterology Patient Name: Tony Mitchell Procedure Date: 04/14/2021 10:04 AM MRN: [id number] Account #: [id number] Date of Birth: May 22, 1941 Admit Type: Outpatient Age: 80 Room: [facility] ENDO ROOM 3 Gender: Male Note Status: Finalized Procedure:             Colonoscopy Indications:           Weight loss Providers:             Andrey Farmer MD, MD Referring MD:          Tracie Harrier, MD (Referring MD) Medicines:             Monitored Anesthesia Care Complications:         No immediate complications. Procedure:             Pre-Anesthesia Assessment:                        - Prior to the procedure, a History and Physical was                         performed, and patient medications and allergies were                         reviewed. The patient is competent. The risks and                         benefits of the procedure and the sedation options and                         risks were discussed with the patient. All questions                         were answered and informed consent was obtained.                         Patient identification and proposed procedure were                         verified by the physician, the nurse, the anesthetist                         and the technician in the endoscopy suite. Mental                         Status Examination: alert and oriented. Airway                         Examination: normal oropharyngeal airway and neck                         mobility. Respiratory Examination: clear to                         auscultation. CV Examination: normal. Prophylactic                         Antibiotics: The patient does not require prophylactic  antibiotics. Prior Anticoagulants: The patient has                         taken no previous anticoagulant or antiplatelet                         agents. ASA Grade Assessment: II - A patient with mild                         systemic disease.  After reviewing the risks and                         benefits, the patient was deemed in satisfactory                         condition to undergo the procedure. The anesthesia                         plan was to use monitored anesthesia care (MAC).                         Immediately prior to administration of medications,                         the patient was re-assessed for adequacy to receive                         sedatives. The heart rate, respiratory rate, oxygen                         saturations, blood pressure, adequacy of pulmonary                         ventilation, and response to care were monitored                         throughout the procedure. The physical status of the                         patient was re-assessed after the procedure.                        After obtaining informed consent, the colonoscope was                         passed under direct vision. Throughout the procedure,                         the patient's blood pressure, pulse, and oxygen                         saturations were monitored continuously. The                         Colonoscope was introduced through the anus and                         advanced to the the cecum, identified by appendiceal  orifice and ileocecal valve. The colonoscopy was                         somewhat difficult due to significant looping.                         Successful completion of the procedure was aided by                         applying abdominal pressure. The patient tolerated the                         procedure well. The quality of the bowel preparation                         was excellent. Findings:      The perianal and digital rectal examinations were normal.      Internal hemorrhoids were found during retroflexion. The hemorrhoids       were Grade I (internal hemorrhoids that do not prolapse).      The exam was otherwise without abnormality on direct and retroflexion        views. Impression:            - Internal hemorrhoids.                        - The examination was otherwise normal on direct and                         retroflexion views.                        - No specimens collected. Recommendation:        - Discharge patient to home.                        - Resume previous diet.                        - Continue present medications.                        - Repeat colonoscopy is not recommended due to current                         age (59 years or older) for screening purposes.                        - Return to referring physician as previously                         scheduled. Procedure Code(s):     --- Professional ---                        [phone], Colonoscopy, flexible; diagnostic, including                         collection of specimen(s) by brushing or washing, when  performed (separate procedure) Diagnosis Code(s):     --- Professional ---                        K64.0, First degree hemorrhoids                        R63.4, Abnormal weight loss CPT copyright 2019 American Medical Association. All rights reserved. The codes documented in this report are preliminary and upon coder review may  be revised to meet current compliance requirements. Andrey Farmer MD, MD 04/14/2021 10:48:02 AM Number of Addenda: 0 Note Initiated On: 04/14/2021 10:04 AM Scope Withdrawal Time: 0 hours 7 minutes 47 seconds  Total Procedure Duration: 0 hours 14 minutes 6 seconds  Estimated Blood Loss:  Estimated blood loss: none.      [facility]

## 2021-04-17 ENCOUNTER — Encounter: Payer: Self-pay | Admitting: Gastroenterology

## 2021-04-17 LAB — SURGICAL PATHOLOGY

## 2021-07-11 ENCOUNTER — Other Ambulatory Visit: Payer: Self-pay | Admitting: Internal Medicine

## 2021-07-11 DIAGNOSIS — I10 Essential (primary) hypertension: Secondary | ICD-10-CM

## 2021-07-11 DIAGNOSIS — R634 Abnormal weight loss: Secondary | ICD-10-CM

## 2021-07-11 DIAGNOSIS — Z87891 Personal history of nicotine dependence: Secondary | ICD-10-CM

## 2021-07-11 DIAGNOSIS — Z8739 Personal history of other diseases of the musculoskeletal system and connective tissue: Secondary | ICD-10-CM

## 2021-07-17 IMAGING — US US RENAL
1 series · 14 of 25 positions shown · non-contrast
Comparison: October 12, 2020

CLINICAL DATA: Acute kidney injury

EXAM:
RENAL / URINARY TRACT ULTRASOUND COMPLETE

[Series 1: us renal · 14 of 40 slices shown]
[im 1/40]
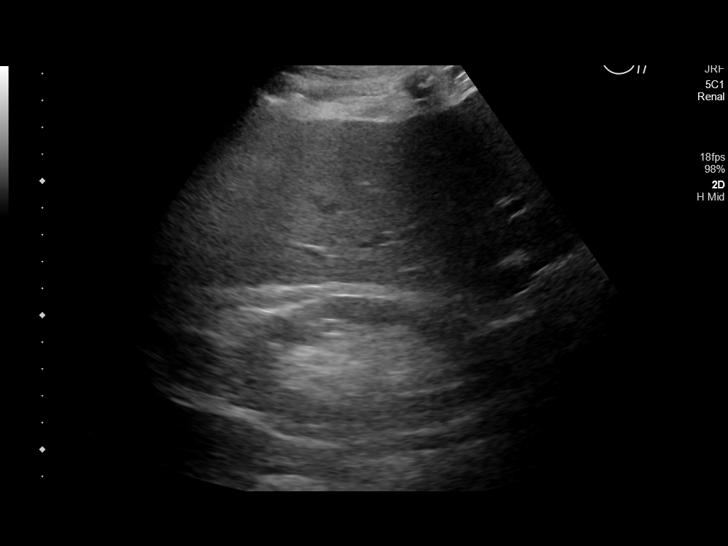
[im 4/40]
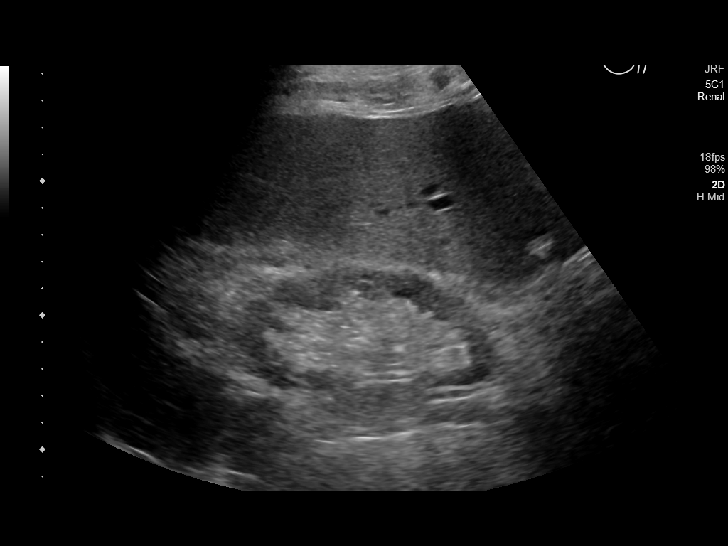
[im 7/40]
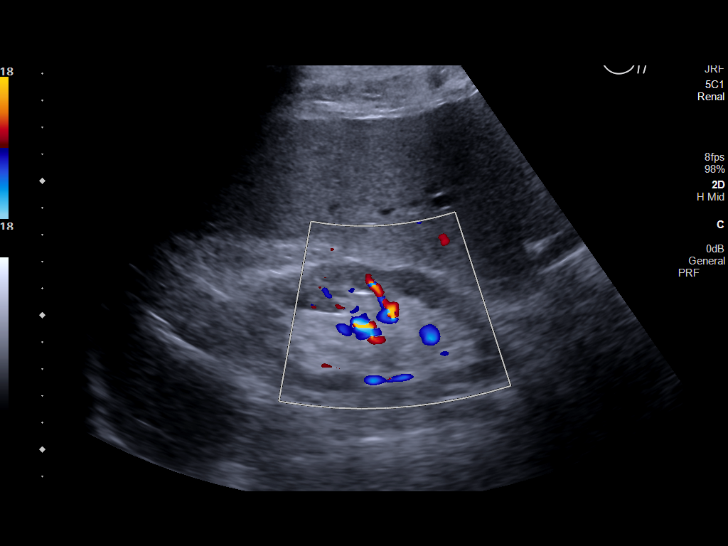
[im 10/40]
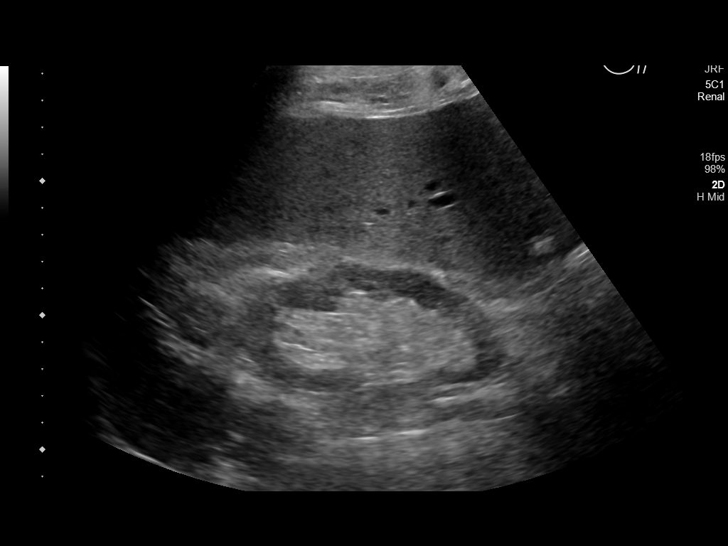
[im 14/40]
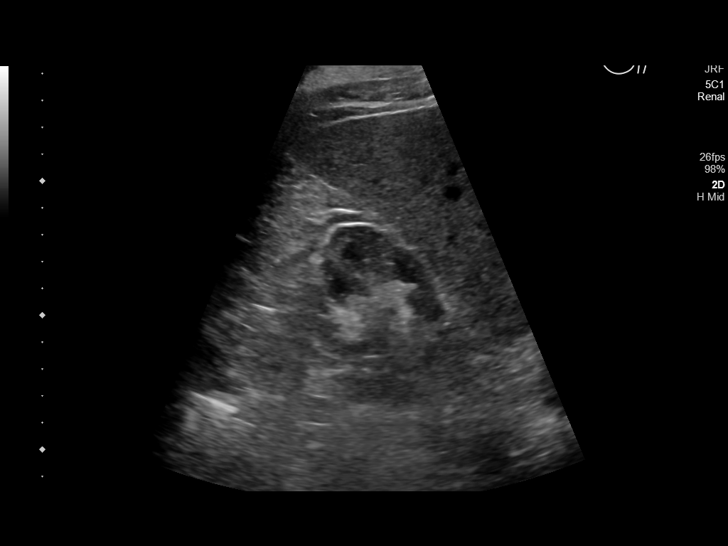
[im 15/40]
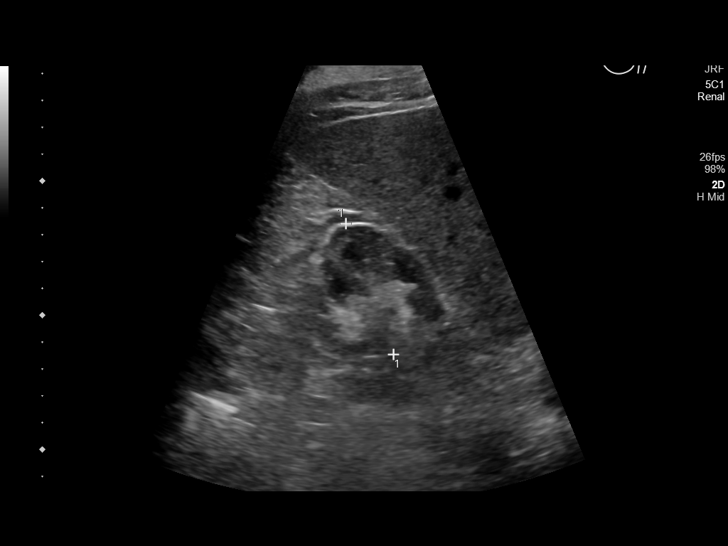
[im 18/40]
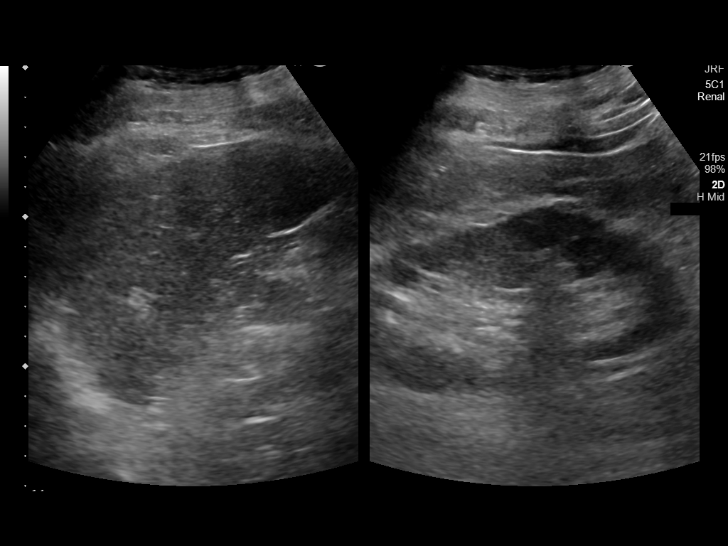
[im 22/40]
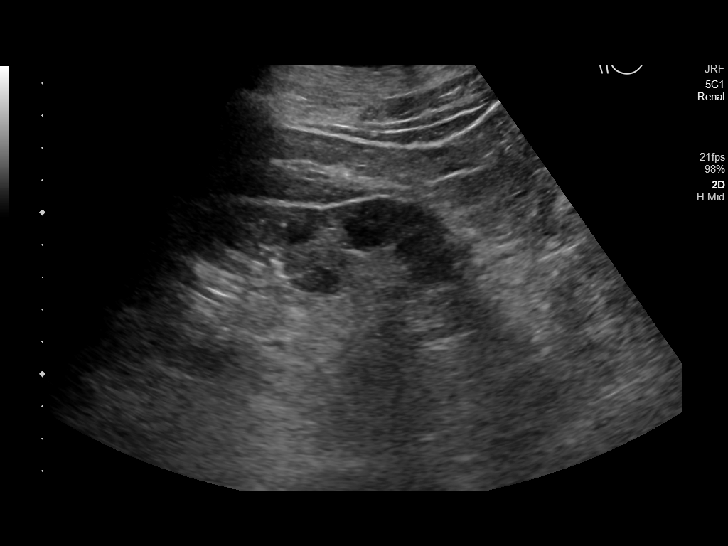
[im 25/40]
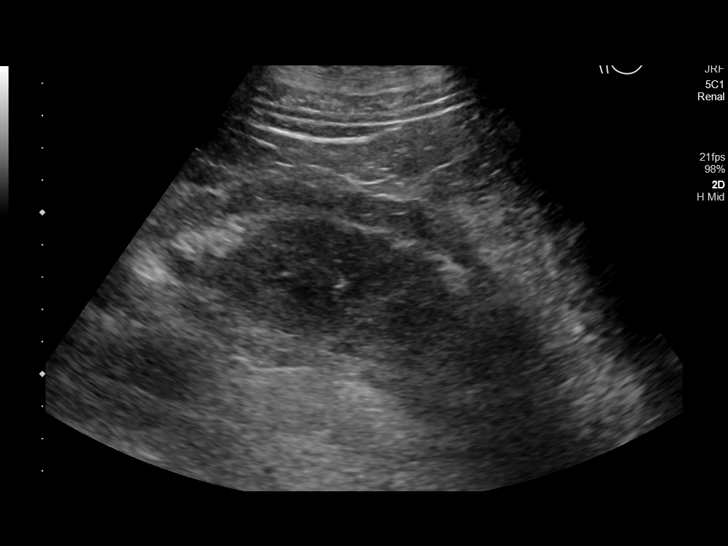
[im 27/40]
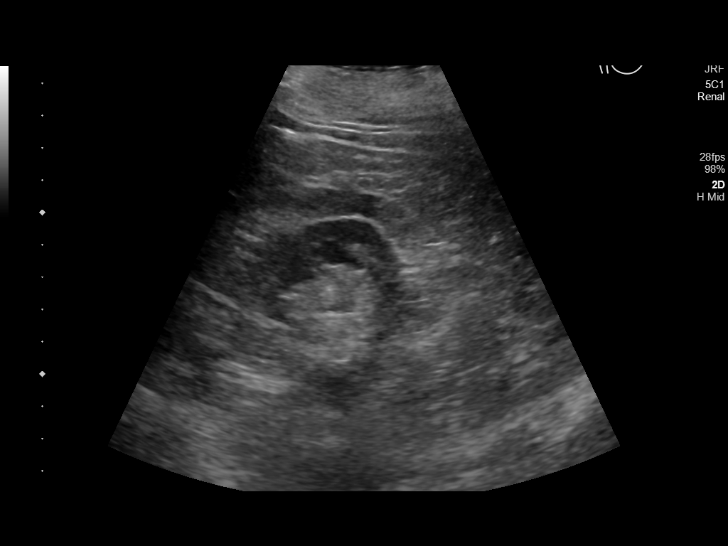
[im 30/40]
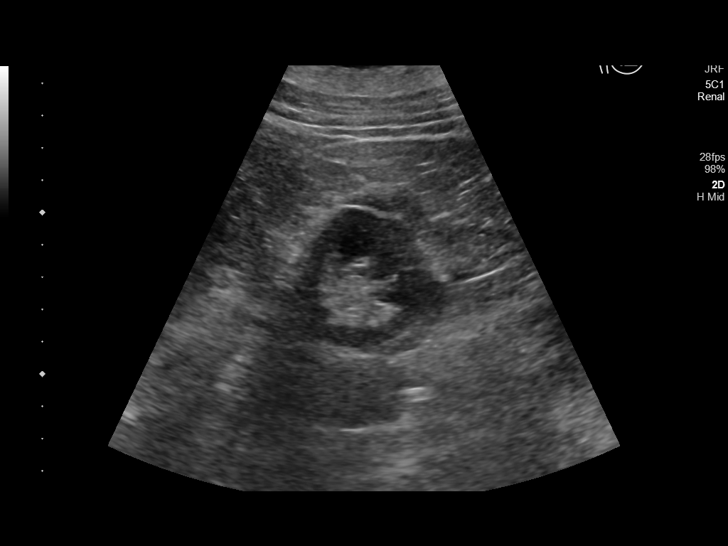
[im 33/40]
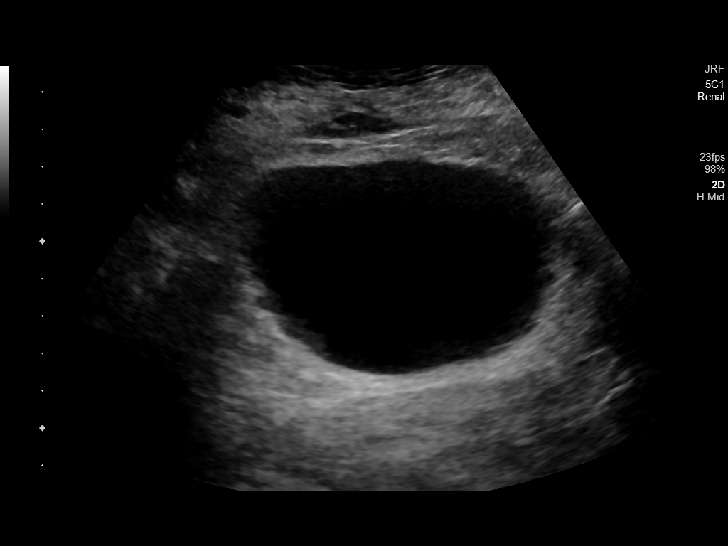
[im 36/40]
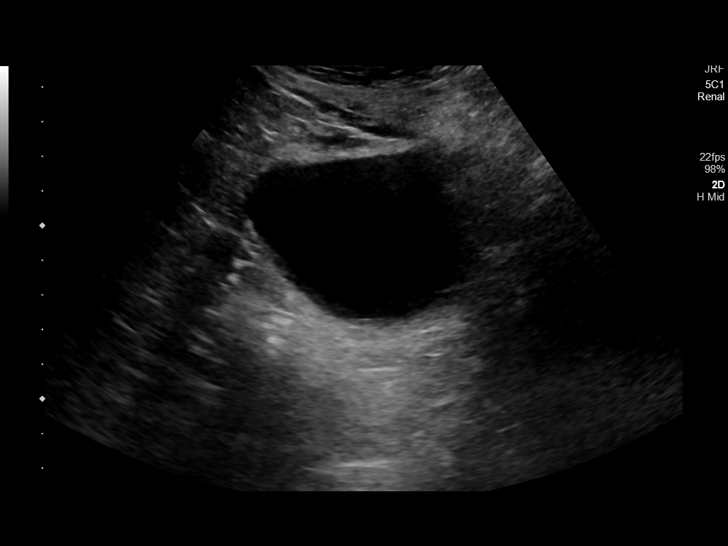
[im 40/40]
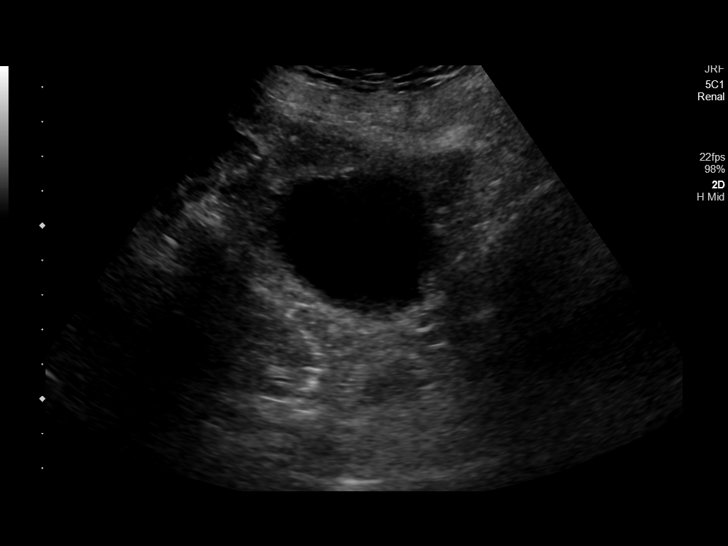

[14 of 25 positions shown; findings below may reference images not displayed]

FINDINGS: Right Kidney:

Renal measurements: 10.1 x 4.7 x 5.2 cm = volume: 129 mL.
Echogenicity is increased. Renal cortical thickness is mildly
diminished. No mass, perinephric fluid, or hydronephrosis
visualized. No sonographically demonstrable calculus or
ureterectasis.

Left Kidney:

Renal measurements: 11.0 x 5.5 x 4.6 cm = volume: 145 mL.
Echogenicity is increased. There is a degree of renal cortical
thinning. No mass, perinephric fluid, or hydronephrosis visualized.
No sonographically demonstrable calculus or ureterectasis.

Bladder:

Appears normal for degree of bladder distention.

Other:

None.
IMPRESSION: Increased renal echogenicity and renal cortical thinning, findings
indicative of medical renal disease. No obstructing focus in either
kidney.

## 2021-08-24 ENCOUNTER — Other Ambulatory Visit: Payer: Self-pay | Admitting: Internal Medicine

## 2021-08-24 DIAGNOSIS — Z8739 Personal history of other diseases of the musculoskeletal system and connective tissue: Secondary | ICD-10-CM

## 2021-08-24 DIAGNOSIS — R634 Abnormal weight loss: Secondary | ICD-10-CM

## 2021-08-24 DIAGNOSIS — Z87891 Personal history of nicotine dependence: Secondary | ICD-10-CM

## 2021-09-02 IMAGING — US US THYROID
1 series · 13 of 25 positions shown · non-contrast
Comparison: None.

CLINICAL DATA: Hyperthyroid.  Low TSH.

EXAM:
THYROID ULTRASOUND
TECHNIQUE: Ultrasound examination of the thyroid gland and adjacent soft
tissues was performed.

[Series 1: us thyroid · 0.07mm/px · 13 of 36 slices shown]
[im 1/36]
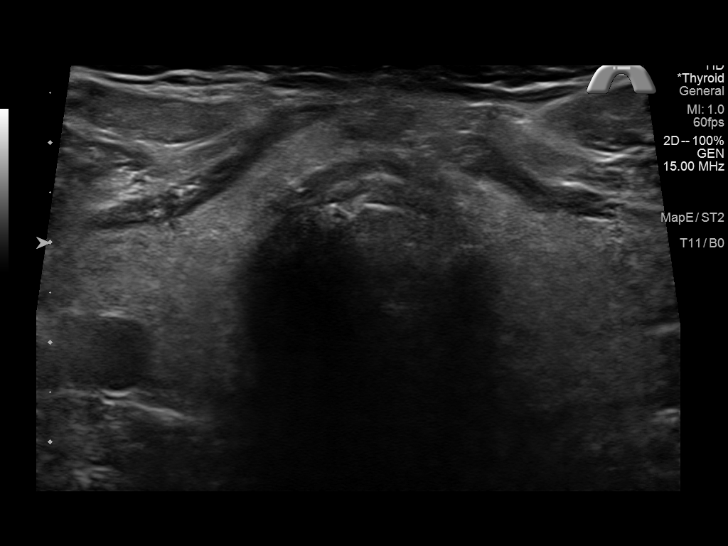
[im 3/36]
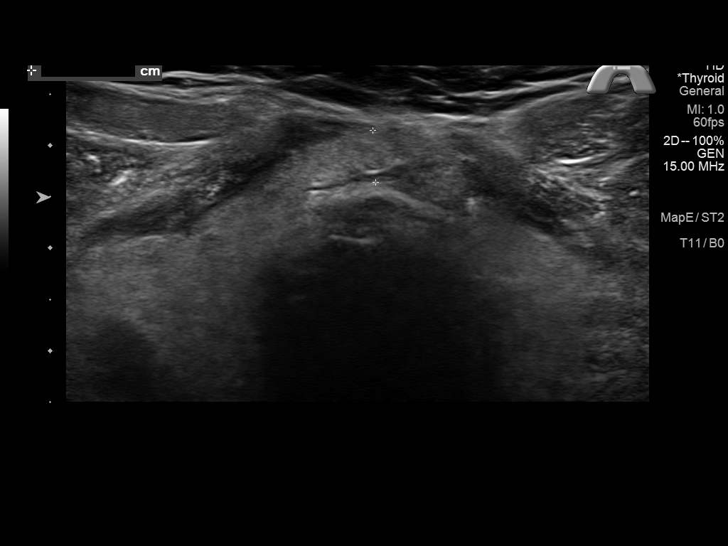
[im 6/36]
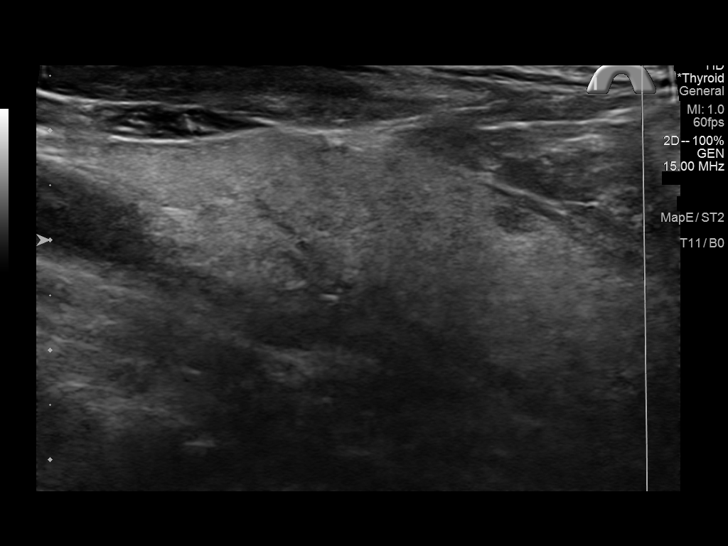
[im 9/36]
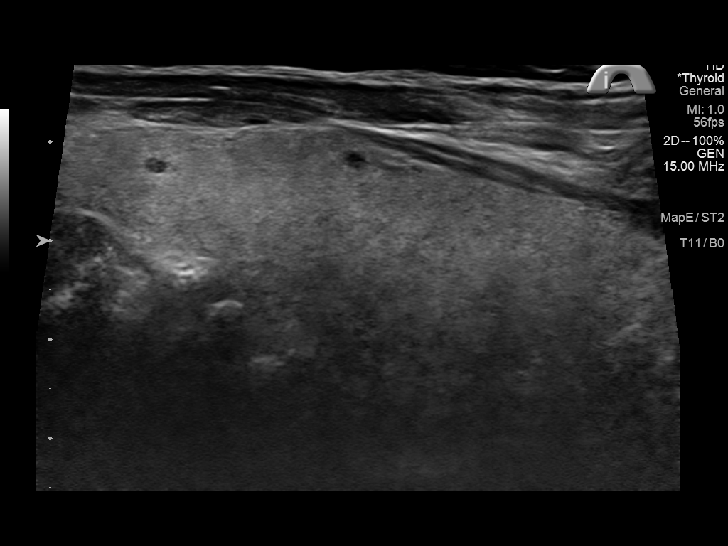
[im 12/36]
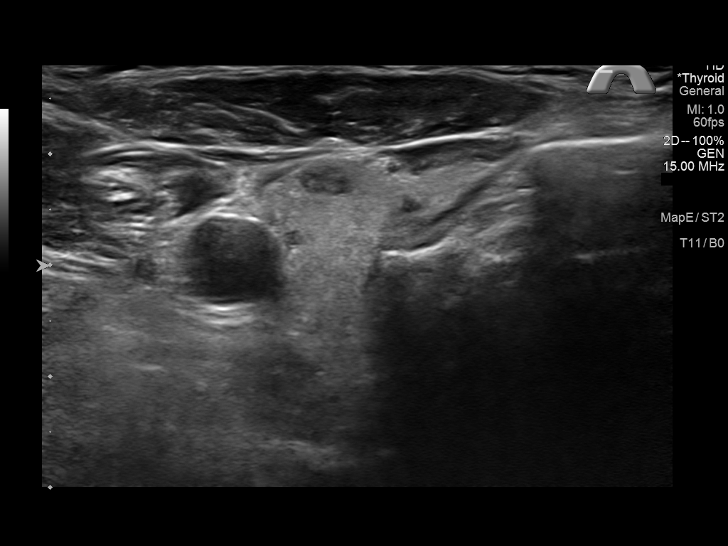
[im 15/36]
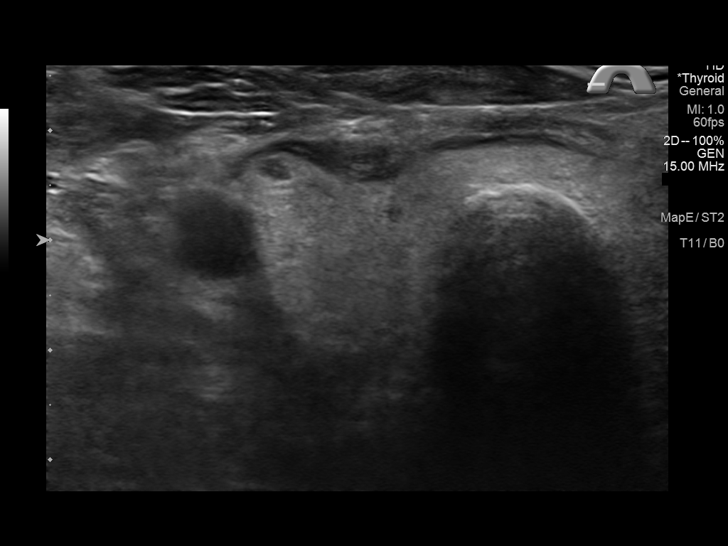
[im 18/36]
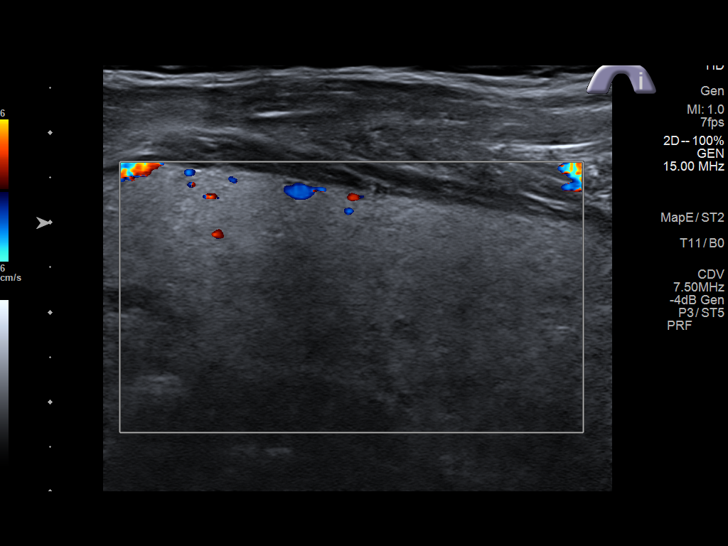
[im 21/36]
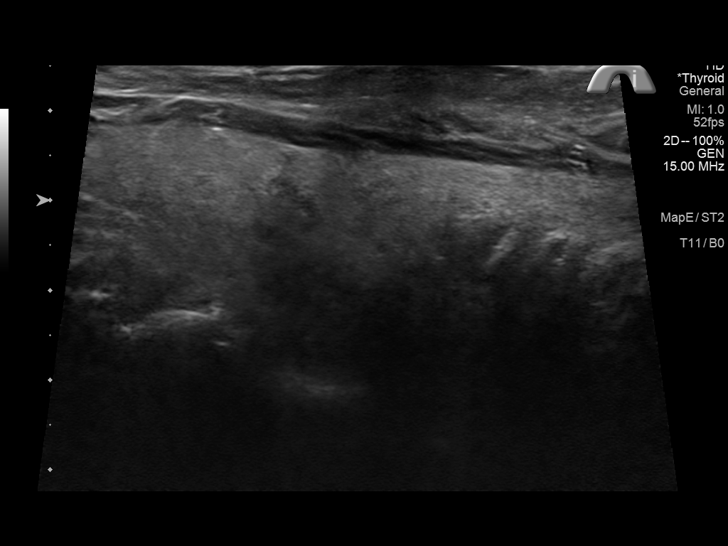
[im 24/36]
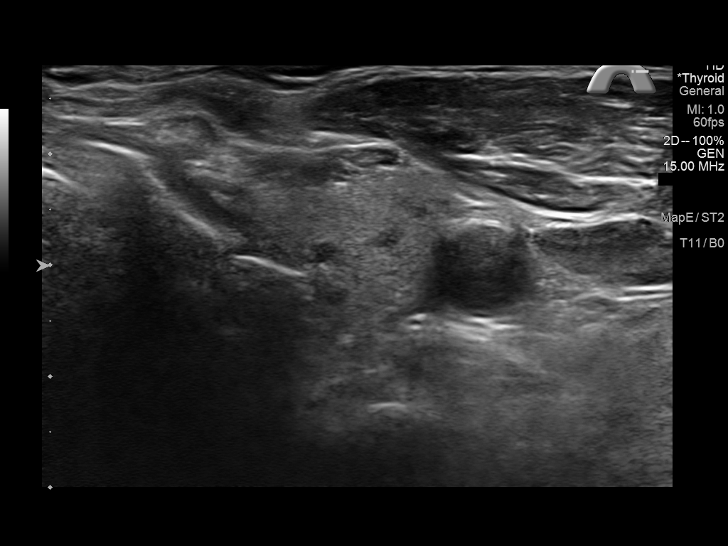
[im 27/36]
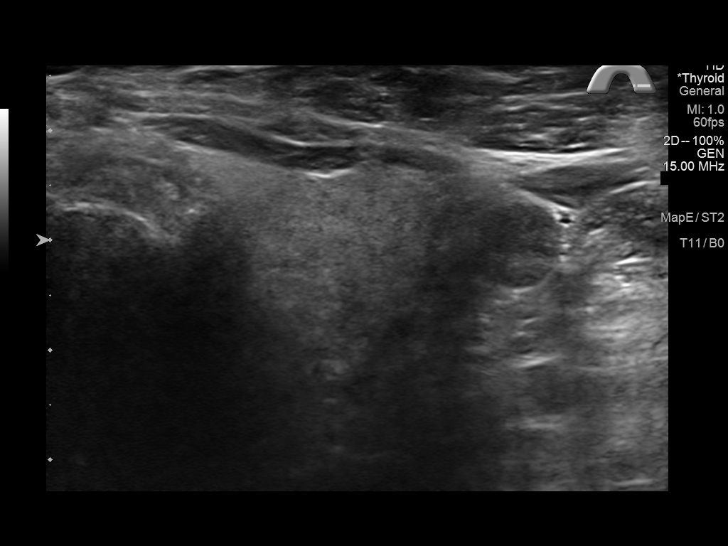
[im 30/36]
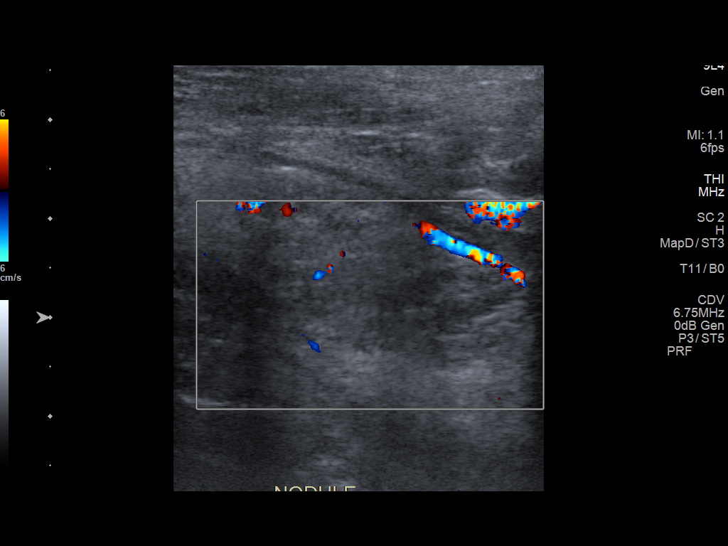
[im 33/36]
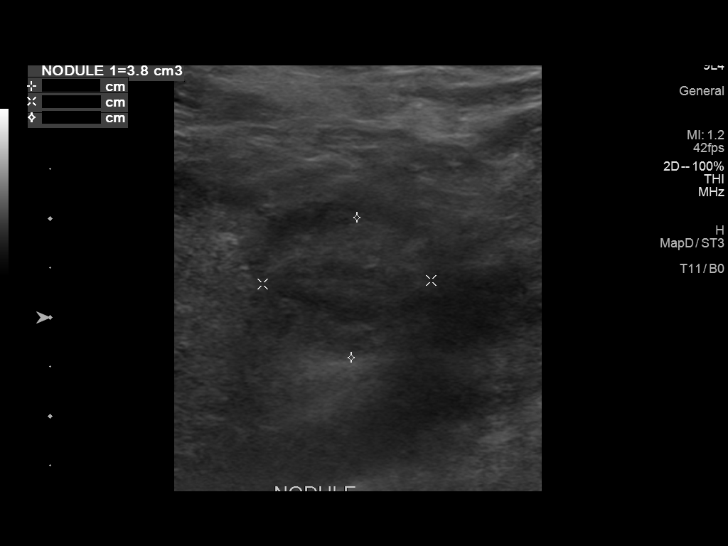
[im 36/36]
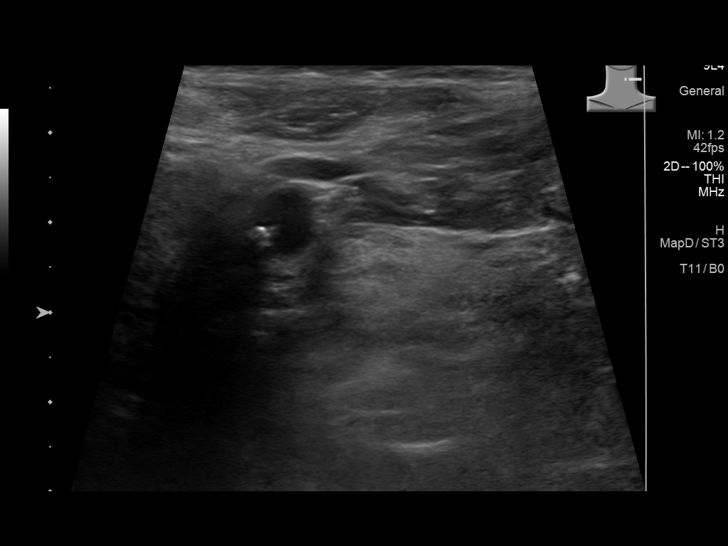

[13 of 25 positions shown; findings below may reference images not displayed]

FINDINGS: Parenchymal Echotexture: Moderately heterogenous

Isthmus: 0.5 cm

Right lobe: 6.5 x 2.5 x 1.8 cm

Left lobe: 6.2 x 2.3 x 2.0 cm

_________________________________________________________

Estimated total number of nodules >/= 1 cm: 1

Number of spongiform nodules >/=  2 cm not described below (TR1): 0

Number of mixed cystic and solid nodules >/= 1.5 cm not described
below (TR2): 0

_________________________________________________________

Nodule # 1:

Location: Left; Inferior

Maximum size: 3.0 cm; Other 2 dimensions: 1.4 x 1.7 cm

Composition: solid/almost completely solid (2)

Echogenicity: isoechoic (1)

Shape: not taller-than-wide (0)

Margins: ill-defined (0)

Echogenic foci: none (0)

ACR TI-RADS total points: 3.

ACR TI-RADS risk category: TR3 (3 points).

ACR TI-RADS recommendations:

**Given size (>/= 2.5 cm) and appearance, fine needle aspiration of
this mildly suspicious nodule should be considered based on TI-RADS
criteria. Although this technically meets criteria for fine-needle
aspiration, this is a very vague area of heterogeneity the and may
actually represent heterogeneous tissue (pseudo nodule) rather than
a discrete nodule.

_________________________________________________________

No abnormal lymph nodes identified by ultrasound.
IMPRESSION: 1. Moderately enlarged thyroid gland with heterogeneous parenchyma.
2. 3 cm nodule versus pseudo nodule in the inferior left lobe.
Although this technically meets criteria for fine-needle aspiration,
this may represent a pseudo nodule and a 1 year follow-up ultrasound
could also be considered.

The above is in keeping with the ACR TI-RADS recommendations - [HOSPITAL] 1507;[DATE].

## 2021-09-05 ENCOUNTER — Ambulatory Visit: Payer: Self-pay | Admitting: Surgery

## 2021-09-05 NOTE — H&P (Signed)
Subjective:  CC: Non-recurrent unilateral inguinal hernia without obstruction or gangrene [K40.90]  HPI:  Tony Mitchell. is a 81 y.o. male who was referred by Renelda Loma, PA for evaluation of above. Symptoms were first noted a few months ago. Pain is dull, intermittent and worsening, confined to the left groin, without radiation.  Associated with nothing, exacerbated by exertion.  Lump is reducible.    Past Medical History:  has a past medical history of B12 deficiency, Collapsed lung (06/15/2016), GERD (gastroesophageal reflux disease), Gout, Hyperlipidemia, and Hypertension.  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  Social History:  reports that he quit smoking about 27 years ago. His smoking use included cigarettes. He has never used smokeless tobacco. He reports current alcohol use of about 7.0 standard drinks per week. He reports that he does not use drugs.  Current Medications: has a current medication list which includes the following prescription(s): allopurinol, alprazolam, amlodipine, aspirin, budesonide-formoterol, colchicine, cyanocobalamin, krill oil, krill oil (omega 3 and 6), lansoprazole, simvastatin, ventolin hfa, fenofibrate nanocrystallized, and hydralazine.  Allergies:  Allergies as of 09/05/2021  (No Known Allergies)   ROS:  A 15 point review of systems was performed and pertinent positives and negatives noted in HPI    Objective:    BP (!) 156/69    Pulse 66    Ht 172.7 cm (5\' 8" )    Wt 65.3 kg (144 lb)    BMI 21.90 kg/m   Constitutional :  Alert, cooperative, no distress Lymphatics/Throat:  Supple, no lymphadenopathy Respiratory:  clear to auscultation bilaterally Cardiovascular:  regular rate and rhythm Gastrointestinal: soft, non-tender; bowel sounds normal; no masses,  no organomegaly. inguinal hernia noted.  small, reducible, no overlying skin changes and left Musculoskeletal: Steady gait and movement Skin: Cool and moist, no surgical scars Psychiatric: Normal affect, non-agitated, not confused     LABS:  n/a   RADS: n/a Assessment:      Non-recurrent unilateral inguinal hernia without obstruction or gangrene [K40.90]. LEFT  Plan:    1. Non-recurrent unilateral inguinal hernia without obstruction or gangrene [K40.90]   Discussed the risk of surgery including recurrence, which can be up to 50% in the case of incisional or complex hernias, possible use of prosthetic materials (mesh) and the increased risk of mesh infxn if used, bleeding, chronic pain, post-op infxn, post-op SBO or ileus, and possible re-operation to address said risks. The risks of general anesthetic, if used, includes MI, CVA, sudden death or even reaction to anesthetic medications also discussed. Alternatives include continued observation.  Benefits include possible symptom relief, prevention of incarceration, strangulation, enlargement in size over time, and the risk of emergency surgery in the face of strangulation.   Typical post-op recovery time of 3-5 days with 2 weeks of activity restrictions were also discussed.  ED return precautions given for sudden increase in pain, size of hernia with accompanying fever, nausea, and/or  vomiting.  The patient verbalized understanding and all questions were answered to the patient's satisfaction.   2. Patient has elected to proceed with surgical treatment. Procedure will be  scheduled. left, robotic assisted laparoscopic, hold aspirin

## 2021-09-05 NOTE — H&P (View-Only) (Signed)
Subjective:  CC: Non-recurrent unilateral inguinal hernia without obstruction or gangrene [K40.90]  HPI:  Tony Mitchell. is a 81 y.o. male who was referred by Renelda Loma, PA for evaluation of above. Symptoms were first noted a few months ago. Pain is dull, intermittent and worsening, confined to the left groin, without radiation.  Associated with nothing, exacerbated by exertion.  Lump is reducible.    Past Medical History:  has a past medical history of B12 deficiency, Collapsed lung (06/15/2016), GERD (gastroesophageal reflux disease), Gout, Hyperlipidemia, and Hypertension.  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  Social History:  reports that he quit smoking about 27 years ago. His smoking use included cigarettes. He has never used smokeless tobacco. He reports current alcohol use of about 7.0 standard drinks per week. He reports that he does not use drugs.  Current Medications: has a current medication list which includes the following prescription(s): allopurinol, alprazolam, amlodipine, aspirin, budesonide-formoterol, colchicine, cyanocobalamin, krill oil, krill oil (omega 3 and 6), lansoprazole, simvastatin, ventolin hfa, fenofibrate nanocrystallized, and hydralazine.  Allergies:  Allergies as of 09/05/2021  (No Known Allergies)   ROS:  A 15 point review of systems was performed and pertinent positives and negatives noted in HPI    Objective:    BP (!) 156/69    Pulse 66    Ht 172.7 cm (5\' 8" )    Wt 65.3 kg (144 lb)    BMI 21.90 kg/m   Constitutional :  Alert, cooperative, no distress Lymphatics/Throat:  Supple, no lymphadenopathy Respiratory:  clear to auscultation bilaterally Cardiovascular:  regular rate and rhythm Gastrointestinal: soft, non-tender; bowel sounds normal; no masses,  no organomegaly. inguinal hernia noted.  small, reducible, no overlying skin changes and left Musculoskeletal: Steady gait and movement Skin: Cool and moist, no surgical scars Psychiatric: Normal affect, non-agitated, not confused     LABS:  n/a   RADS: n/a Assessment:      Non-recurrent unilateral inguinal hernia without obstruction or gangrene [K40.90]. LEFT  Plan:    1. Non-recurrent unilateral inguinal hernia without obstruction or gangrene [K40.90]   Discussed the risk of surgery including recurrence, which can be up to 50% in the case of incisional or complex hernias, possible use of prosthetic materials (mesh) and the increased risk of mesh infxn if used, bleeding, chronic pain, post-op infxn, post-op SBO or ileus, and possible re-operation to address said risks. The risks of general anesthetic, if used, includes MI, CVA, sudden death or even reaction to anesthetic medications also discussed. Alternatives include continued observation.  Benefits include possible symptom relief, prevention of incarceration, strangulation, enlargement in size over time, and the risk of emergency surgery in the face of strangulation.   Typical post-op recovery time of 3-5 days with 2 weeks of activity restrictions were also discussed.  ED return precautions given for sudden increase in pain, size of hernia with accompanying fever, nausea, and/or  vomiting.  The patient verbalized understanding and all questions were answered to the patient's satisfaction.   2. Patient has elected to proceed with surgical treatment. Procedure will be  scheduled. left, robotic assisted laparoscopic, hold aspirin

## 2021-09-14 ENCOUNTER — Encounter
Admission: RE | Admit: 2021-09-14 | Discharge: 2021-09-14 | Disposition: A | Discharge status: Home / Self Care | Payer: Medicare Other | Source: Ambulatory Visit | Attending: Surgery | Admitting: Surgery

## 2021-09-14 ENCOUNTER — Other Ambulatory Visit: Payer: Self-pay

## 2021-09-14 HISTORY — DX: Chronic kidney disease, stage 3b: N18.32

## 2021-09-14 HISTORY — DX: Malignant (primary) neoplasm, unspecified: C80.1

## 2021-09-14 HISTORY — DX: Type 2 diabetes mellitus without complications: E11.9

## 2021-09-14 HISTORY — DX: Thoracic aortic aneurysm, without rupture, unspecified: I71.20

## 2021-09-14 NOTE — Patient Instructions (Signed)
Your procedure is scheduled on:09-22-21 Friday Report to the Registration Desk on the 1st floor of the [facility].Then proceed to the 2nd floor Surgery Desk in the [facility] To find out your arrival time, please call [phone] between 1PM - 3PM on:09-21-21 Thursday  REMEMBER: Instructions that are not followed completely may result in serious medical risk, up to and including death; or upon the discretion of your surgeon and anesthesiologist your surgery may need to be rescheduled.  Do not eat food after midnight the night before surgery.  No gum chewing, lozengers or hard candies.  You may however, drink CLEAR liquids up to 2 hours before you are scheduled to arrive for your surgery. Do not drink anything within 2 hours of your scheduled arrival time.  Clear liquids include: - water  - apple juice without pulp - gatorade (not RED, PURPLE, OR BLUE) - black coffee or tea (Do NOT add milk or creamers to the coffee or tea) Do NOT drink anything that is not on this list.  TAKE THESE MEDICATIONS THE MORNING OF SURGERY WITH A SIP OF WATER: -allopurinol (ZYLOPRIM)  -fenofibrate (TRICOR)  -amLODipine (NORVASC)  -hydrALAZINE (APRESOLINE) -lansoprazole (PREVACID)  Use your SYMBICORT 80-4.5 MCG/ACT inhaler the morning of surgery  Stop your Aspirin 5 days prior to surgery as instructed by Dr Purcell Nails dose on 09-16-21 Saturday  One week prior to surgery: Stop Anti-inflammatories (NSAIDS) such as Advil, Aleve, Ibuprofen, Motrin, Naproxen, Naprosyn and Aspirin based products such as Excedrin, Goodys Powder, BC Powder.You may however, continue to take Tylenol if needed for pain up until the day of surgery.  Stop ANY OVER THE COUNTER supplements/vitamins NOW (09-14-21) until after surgery (vitamin B-12 and Krill Oil)  No Alcohol for 24 hours before or after surgery.  No Smoking including e-cigarettes for 24 hours prior to surgery.  No chewable tobacco products for at least 6 hours prior to  surgery.  No nicotine patches on the day of surgery.  Do not use any "recreational" drugs for at least a week prior to your surgery.  Please be advised that the combination of cocaine and anesthesia may have negative outcomes, up to and including death. If you test positive for cocaine, your surgery will be cancelled.  On the morning of surgery brush your teeth with toothpaste and water, you may rinse your mouth with mouthwash if you wish. Do not swallow any toothpaste or mouthwash.  Use CHG Soap as directed on instruction sheet.  Do not wear jewelry, make-up, hairpins, clips or nail polish.  Do not wear lotions, powders, or perfumes.   Do not shave body from the neck down 48 hours prior to surgery just in case you cut yourself which could leave a site for infection.  Also, freshly shaved skin may become irritated if using the CHG soap.  Contact lenses, hearing aids and dentures may not be worn into surgery.  Do not bring valuables to the hospital. [facility] is not responsible for any missing/lost belongings or valuables.   Bring your C-PAP to the hospital with you   Notify your doctor if there is any change in your medical condition (cold, fever, infection).  Wear comfortable clothing (specific to your surgery type) to the hospital.  After surgery, you can help prevent lung complications by doing breathing exercises.  Take deep breaths and cough every 1-2 hours. Your doctor may order a device called an Incentive Spirometer to help you take deep breaths. When coughing or sneezing, hold a pillow firmly  against your incision with both hands. This is called splinting. Doing this helps protect your incision. It also decreases belly discomfort.  If you are being admitted to the hospital overnight, leave your suitcase in the car. After surgery it may be brought to your room.  If you are being discharged the day of surgery, you will not be allowed to drive home. You will need a  responsible adult (18 years or older) to drive you home and stay with you that night.   If you are taking public transportation, you will need to have a responsible adult (18 years or older) with you. Please confirm with your physician that it is acceptable to use public transportation.   Please call the [facility] at [phone] if you have any questions about these instructions.  Surgery Visitation Policy:  Patients undergoing a surgery or procedure may have one family member or support person with them as long as that person is not COVID-19 positive or experiencing its symptoms.  That person may remain in the waiting area during the procedure and may rotate out with other people.  Inpatient Visitation:    Visiting hours are 7 a.m. to 8 p.m. Up to two visitors ages 16+ are allowed at one time in a patient room. The visitors may rotate out with other people during the day. Visitors must check out when they leave, or other visitors will not be allowed. One designated support person may remain overnight. The visitor must pass COVID-19 screenings, use hand sanitizer when entering and exiting the patients room and wear a mask at all times, including in the patients room. Patients must also wear a mask when staff or their visitor are in the room. Masking is required regardless of vaccination status.

## 2021-09-18 ENCOUNTER — Other Ambulatory Visit: Payer: Self-pay

## 2021-09-18 ENCOUNTER — Encounter
Admission: RE | Admit: 2021-09-18 | Discharge: 2021-09-18 | Disposition: A | Discharge status: Home / Self Care | Payer: Medicare Other | Source: Ambulatory Visit | Attending: Surgery | Admitting: Surgery

## 2021-09-18 DIAGNOSIS — I10 Essential (primary) hypertension: Secondary | ICD-10-CM | POA: Insufficient documentation

## 2021-09-18 DIAGNOSIS — Z01810 Encounter for preprocedural cardiovascular examination: Secondary | ICD-10-CM | POA: Insufficient documentation

## 2021-09-22 ENCOUNTER — Ambulatory Visit
Admission: RE | Admit: 2021-09-22 | Discharge: 2021-09-22 | Disposition: A | Discharge status: Home / Self Care | Payer: Medicare Other | Attending: Surgery | Admitting: Surgery

## 2021-09-22 ENCOUNTER — Encounter: Payer: Self-pay | Admitting: Surgery

## 2021-09-22 ENCOUNTER — Ambulatory Visit: Payer: Medicare Other | Admitting: Urgent Care

## 2021-09-22 ENCOUNTER — Encounter
Admission: RE | Disposition: A | Discharge status: Home / Self Care | Payer: Self-pay | Source: Home / Self Care | Attending: Surgery

## 2021-09-22 ENCOUNTER — Other Ambulatory Visit: Payer: Self-pay

## 2021-09-22 DIAGNOSIS — E1122 Type 2 diabetes mellitus with diabetic chronic kidney disease: Secondary | ICD-10-CM | POA: Insufficient documentation

## 2021-09-22 DIAGNOSIS — K219 Gastro-esophageal reflux disease without esophagitis: Secondary | ICD-10-CM | POA: Diagnosis not present

## 2021-09-22 DIAGNOSIS — M109 Gout, unspecified: Secondary | ICD-10-CM | POA: Insufficient documentation

## 2021-09-22 DIAGNOSIS — I2510 Atherosclerotic heart disease of native coronary artery without angina pectoris: Secondary | ICD-10-CM | POA: Diagnosis not present

## 2021-09-22 DIAGNOSIS — Z87891 Personal history of nicotine dependence: Secondary | ICD-10-CM | POA: Diagnosis not present

## 2021-09-22 DIAGNOSIS — G4730 Sleep apnea, unspecified: Secondary | ICD-10-CM | POA: Insufficient documentation

## 2021-09-22 DIAGNOSIS — K4090 Unilateral inguinal hernia, without obstruction or gangrene, not specified as recurrent: Secondary | ICD-10-CM | POA: Insufficient documentation

## 2021-09-22 DIAGNOSIS — E785 Hyperlipidemia, unspecified: Secondary | ICD-10-CM | POA: Insufficient documentation

## 2021-09-22 DIAGNOSIS — J449 Chronic obstructive pulmonary disease, unspecified: Secondary | ICD-10-CM | POA: Diagnosis not present

## 2021-09-22 DIAGNOSIS — I129 Hypertensive chronic kidney disease with stage 1 through stage 4 chronic kidney disease, or unspecified chronic kidney disease: Secondary | ICD-10-CM | POA: Insufficient documentation

## 2021-09-22 DIAGNOSIS — N1832 Chronic kidney disease, stage 3b: Secondary | ICD-10-CM | POA: Insufficient documentation

## 2021-09-22 HISTORY — PX: INSERTION OF MESH: SHX5868

## 2021-09-22 SURGERY — HERNIORRHAPHY, INGUINAL, ROBOT-ASSISTED, LAPAROSCOPIC
Anesthesia: General | Site: Groin | Laterality: Left

## 2021-09-22 MED ORDER — ROCURONIUM BROMIDE 10 MG/ML (PF) SYRINGE
PREFILLED_SYRINGE | INTRAVENOUS | Status: AC
  Filled 2021-09-22: qty 10

## 2021-09-22 MED ORDER — ROCURONIUM BROMIDE 100 MG/10ML IV SOLN
INTRAVENOUS | Status: DC | PRN
  Administered 2021-09-22: 50 mg via INTRAVENOUS

## 2021-09-22 MED ORDER — LIDOCAINE HCL (PF) 2 % IJ SOLN
INTRAMUSCULAR | Status: AC
  Filled 2021-09-22: qty 5

## 2021-09-22 MED ORDER — ACETAMINOPHEN 500 MG PO TABS
ORAL_TABLET | ORAL | Status: AC
  Administered 2021-09-22: 1000 mg via ORAL
  Filled 2021-09-22: qty 2

## 2021-09-22 MED ORDER — OXYCODONE HCL 5 MG PO TABS: 5.0000 mg | ORAL_TABLET | Freq: Once | ORAL | Status: DC | PRN

## 2021-09-22 MED ORDER — CELECOXIB 200 MG PO CAPS: 200.0000 mg | ORAL_CAPSULE | ORAL | Status: AC

## 2021-09-22 MED ORDER — CHLORHEXIDINE GLUCONATE 0.12 % MT SOLN: 15.0000 mL | Freq: Once | OROMUCOSAL | Status: AC

## 2021-09-22 MED ORDER — BUPIVACAINE LIPOSOME 1.3 % IJ SUSP
INTRAMUSCULAR | Status: AC
  Filled 2021-09-22: qty 20

## 2021-09-22 MED ORDER — CHLORHEXIDINE GLUCONATE 0.12 % MT SOLN
OROMUCOSAL | Status: AC
  Administered 2021-09-22: 15 mL via OROMUCOSAL
  Filled 2021-09-22: qty 15

## 2021-09-22 MED ORDER — BUPIVACAINE-EPINEPHRINE 0.5% -1:200000 IJ SOLN
INTRAMUSCULAR | Status: DC | PRN
  Administered 2021-09-22: 13 mL

## 2021-09-22 MED ORDER — PHENYLEPHRINE HCL (PRESSORS) 10 MG/ML IV SOLN
INTRAVENOUS | Status: DC | PRN
  Administered 2021-09-22: 160 ug via INTRAVENOUS

## 2021-09-22 MED ORDER — FENTANYL CITRATE (PF) 100 MCG/2ML IJ SOLN
INTRAMUSCULAR | Status: AC
  Filled 2021-09-22: qty 2

## 2021-09-22 MED ORDER — CELECOXIB 200 MG PO CAPS
ORAL_CAPSULE | ORAL | Status: AC
  Administered 2021-09-22: 200 mg via ORAL
  Filled 2021-09-22: qty 1

## 2021-09-22 MED ORDER — CEFAZOLIN SODIUM-DEXTROSE 2-4 GM/100ML-% IV SOLN
INTRAVENOUS | Status: AC
  Filled 2021-09-22: qty 100

## 2021-09-22 MED ORDER — CEFAZOLIN SODIUM-DEXTROSE 2-4 GM/100ML-% IV SOLN
2.0000 g | INTRAVENOUS | Status: AC
  Administered 2021-09-22: 2 g via INTRAVENOUS

## 2021-09-22 MED ORDER — ONDANSETRON HCL 4 MG/2ML IJ SOLN
INTRAMUSCULAR | Status: AC
  Filled 2021-09-22: qty 2

## 2021-09-22 MED ORDER — ACETAMINOPHEN 325 MG PO TABS: 650.0000 mg | ORAL_TABLET | Freq: Three times a day (TID) | ORAL | 0 refills | Status: AC | PRN

## 2021-09-22 MED ORDER — PROPOFOL 10 MG/ML IV BOLUS
INTRAVENOUS | Status: AC
  Filled 2021-09-22: qty 20

## 2021-09-22 MED ORDER — DOCUSATE SODIUM 100 MG PO CAPS: 100.0000 mg | ORAL_CAPSULE | Freq: Two times a day (BID) | ORAL | 0 refills | Status: AC | PRN

## 2021-09-22 MED ORDER — FENTANYL CITRATE (PF) 100 MCG/2ML IJ SOLN: 25.0000 ug | INTRAMUSCULAR | Status: DC | PRN

## 2021-09-22 MED ORDER — OXYCODONE HCL 5 MG/5ML PO SOLN: 5.0000 mg | Freq: Once | ORAL | Status: DC | PRN

## 2021-09-22 MED ORDER — BUPIVACAINE LIPOSOME 1.3 % IJ SUSP
INTRAMUSCULAR | Status: DC | PRN
  Administered 2021-09-22: 20 mL

## 2021-09-22 MED ORDER — SUGAMMADEX SODIUM 200 MG/2ML IV SOLN
INTRAVENOUS | Status: DC | PRN
  Administered 2021-09-22: 200 mg via INTRAVENOUS

## 2021-09-22 MED ORDER — LACTATED RINGERS IV SOLN: INTRAVENOUS | Status: DC

## 2021-09-22 MED ORDER — ALBUTEROL SULFATE HFA 108 (90 BASE) MCG/ACT IN AERS
INHALATION_SPRAY | RESPIRATORY_TRACT | Status: AC
  Filled 2021-09-22: qty 6.7

## 2021-09-22 MED ORDER — BUPIVACAINE-EPINEPHRINE (PF) 0.5% -1:200000 IJ SOLN
INTRAMUSCULAR | Status: AC
  Filled 2021-09-22: qty 30

## 2021-09-22 MED ORDER — DEXAMETHASONE SODIUM PHOSPHATE 10 MG/ML IJ SOLN
INTRAMUSCULAR | Status: DC | PRN
  Administered 2021-09-22: 8 mg via INTRAVENOUS

## 2021-09-22 MED ORDER — ACETAMINOPHEN 500 MG PO TABS: 1000.0000 mg | ORAL_TABLET | ORAL | Status: AC

## 2021-09-22 MED ORDER — DEXAMETHASONE SODIUM PHOSPHATE 10 MG/ML IJ SOLN
INTRAMUSCULAR | Status: AC
  Filled 2021-09-22: qty 1

## 2021-09-22 MED ORDER — CHLORHEXIDINE GLUCONATE CLOTH 2 % EX PADS: 6.0000 | MEDICATED_PAD | Freq: Once | CUTANEOUS | Status: DC

## 2021-09-22 MED ORDER — HYDROCODONE-ACETAMINOPHEN 5-325 MG PO TABS: 1.0000 | ORAL_TABLET | Freq: Four times a day (QID) | ORAL | 0 refills | Status: DC | PRN

## 2021-09-22 MED ORDER — EPHEDRINE SULFATE (PRESSORS) 50 MG/ML IJ SOLN
INTRAMUSCULAR | Status: DC | PRN
  Administered 2021-09-22: 10 mg via INTRAVENOUS

## 2021-09-22 MED ORDER — ORAL CARE MOUTH RINSE: 15.0000 mL | Freq: Once | OROMUCOSAL | Status: AC

## 2021-09-22 MED ORDER — FENTANYL CITRATE (PF) 100 MCG/2ML IJ SOLN
INTRAMUSCULAR | Status: DC | PRN
  Administered 2021-09-22: 25 ug via INTRAVENOUS
  Administered 2021-09-22: 50 ug via INTRAVENOUS
  Administered 2021-09-22: 25 ug via INTRAVENOUS

## 2021-09-22 MED ORDER — PROPOFOL 10 MG/ML IV BOLUS
INTRAVENOUS | Status: DC | PRN
  Administered 2021-09-22: 130 mg via INTRAVENOUS

## 2021-09-22 MED ORDER — ONDANSETRON HCL 4 MG/2ML IJ SOLN
INTRAMUSCULAR | Status: DC | PRN
  Administered 2021-09-22: 4 mg via INTRAVENOUS

## 2021-09-22 MED ORDER — LIDOCAINE HCL (CARDIAC) PF 100 MG/5ML IV SOSY
PREFILLED_SYRINGE | INTRAVENOUS | Status: DC | PRN
  Administered 2021-09-22: 80 mg via INTRAVENOUS

## 2021-09-22 SURGICAL SUPPLY — 50 items
ADH SKN CLS APL DERMABOND .7 (GAUZE/BANDAGES/DRESSINGS) ×2
BAG INFUSER PRESSURE 100CC (MISCELLANEOUS) IMPLANT
BLADE SURG SZ11 CARB STEEL (BLADE) ×3 IMPLANT
BNDG GAUZE ELAST 4 BULKY (GAUZE/BANDAGES/DRESSINGS) ×3 IMPLANT
COVER TIP SHEARS 8 DVNC (MISCELLANEOUS) ×2 IMPLANT
COVER TIP SHEARS 8MM DA VINCI (MISCELLANEOUS) ×1
DERMABOND ADVANCED (GAUZE/BANDAGES/DRESSINGS) ×1
DERMABOND ADVANCED .7 DNX12 (GAUZE/BANDAGES/DRESSINGS) ×2 IMPLANT
DRAPE ARM DVNC X/XI (DISPOSABLE) ×6 IMPLANT
DRAPE COLUMN DVNC XI (DISPOSABLE) ×2 IMPLANT
DRAPE DA VINCI XI ARM (DISPOSABLE) ×3
DRAPE DA VINCI XI COLUMN (DISPOSABLE) ×1
ELECT CAUTERY BLADE 6.4 (BLADE) IMPLANT
ELECT REM PT RETURN 9FT ADLT (ELECTROSURGICAL) ×3
ELECTRODE REM PT RTRN 9FT ADLT (ELECTROSURGICAL) ×2 IMPLANT
GLOVE SURG SYN 6.5 ES PF (GLOVE) ×15 IMPLANT
GLOVE SURG SYN 6.5 PF PI (GLOVE) ×4 IMPLANT
GLOVE SURG UNDER POLY LF SZ7 (GLOVE) ×9 IMPLANT
GOWN STRL REUS W/ TWL LRG LVL3 (GOWN DISPOSABLE) ×6 IMPLANT
GOWN STRL REUS W/TWL LRG LVL3 (GOWN DISPOSABLE) ×15
IRRIGATOR SUCT 8 DISP DVNC XI (IRRIGATION / IRRIGATOR) IMPLANT
IRRIGATOR SUCTION 8MM XI DISP (IRRIGATION / IRRIGATOR)
IV NS 1000ML (IV SOLUTION)
IV NS 1000ML BAXH (IV SOLUTION) IMPLANT
LABEL OR SOLS (LABEL) IMPLANT
MANIFOLD NEPTUNE II (INSTRUMENTS) ×3 IMPLANT
MESH 3DMAX 4X6 LT LRG (Mesh General) ×1 IMPLANT
MESH 3DMAX MID 4X6 LT LRG (Mesh General) IMPLANT
NDL INSUFFLATION 14GA 120MM (NEEDLE) ×2 IMPLANT
NEEDLE HYPO 22GX1.5 SAFETY (NEEDLE) ×3 IMPLANT
NEEDLE INSUFFLATION 14GA 120MM (NEEDLE) ×3 IMPLANT
OBTURATOR OPTICAL STANDARD 8MM (TROCAR) ×1
OBTURATOR OPTICAL STND 8 DVNC (TROCAR) ×2
OBTURATOR OPTICALSTD 8 DVNC (TROCAR) ×2 IMPLANT
PACK LAP CHOLECYSTECTOMY (MISCELLANEOUS) ×3 IMPLANT
PENCIL ELECTRO HAND CTR (MISCELLANEOUS) ×3 IMPLANT
SEAL CANN UNIV 5-8 DVNC XI (MISCELLANEOUS) ×6 IMPLANT
SEAL XI 5MM-8MM UNIVERSAL (MISCELLANEOUS) ×3
SET TUBE SMOKE EVAC HIGH FLOW (TUBING) ×3 IMPLANT
SOLUTION ELECTROLUBE (MISCELLANEOUS) ×3 IMPLANT
SPONGE T-LAP 4X18 ~~LOC~~+RFID (SPONGE) ×1 IMPLANT
SUT MNCRL 4-0 (SUTURE) ×6
SUT MNCRL 4-0 27XMFL (SUTURE) ×4
SUT VIC AB 2-0 SH 27 (SUTURE) ×3
SUT VIC AB 2-0 SH 27XBRD (SUTURE) ×2 IMPLANT
SUT VLOC 90 6 CV-15 VIOLET (SUTURE) ×6 IMPLANT
SUTURE MNCRL 4-0 27XMF (SUTURE) ×2 IMPLANT
SYR 30ML LL (SYRINGE) ×3 IMPLANT
TAPE TRANSPORE STRL 2 31045 (GAUZE/BANDAGES/DRESSINGS) ×2 IMPLANT
WATER STERILE IRR 500ML POUR (IV SOLUTION) ×2 IMPLANT

## 2021-09-22 NOTE — Discharge Instructions (Addendum)
Hernia repair, Care After This sheet gives you information about how to care for yourself after your procedure. Your health care provider may also give you more specific instructions. If you have problems or questions, contact your health care provider. What can I expect after the procedure? After your procedure, it is common to have the following: Pain in your abdomen, especially in the incision areas. You will be given medicine to control the pain. Tiredness. This is a normal part of the recovery process. Your energy level will return to normal over the next several weeks. Changes in your bowel movements, such as constipation or needing to go more often. Talk with your health care provider about how to manage this. Follow these instructions at home: Medicines  tylenol as needed for discomfort.    Use narcotics, if prescribed, only when tylenol and motrin is not enough to control pain.  325-650mg  every 8hrs to max of 3000mg /24hrs (including the 325mg  in every norco dose) for the tylenol.    RESUME ASPIRIN IN 48HRS PLEASE RECORD NUMBER OF PILLS TAKEN UNTIL NEXT FOLLOW UP APPT.  THIS WILL HELP DETERMINE HOW READY YOU ARE TO BE RELEASED FROM ANY ACTIVITY RESTRICTIONS Do not drive or use heavy machinery while taking prescription pain medicine. Do not drink alcohol while taking prescription pain medicine.  Incision care    Follow instructions from your health care provider about how to take care of your incision areas. Make sure you: Keep your incisions clean and dry. Wash your hands with soap and water before and after applying medicine to the areas, and before and after changing your bandage (dressing). If soap and water are not available, use hand sanitizer. Change your dressing as told by your health care provider. Leave stitches (sutures), skin glue, or adhesive strips in place. These skin closures may need to stay in place for 2 weeks or longer. If adhesive strip edges start to loosen and  curl up, you may trim the loose edges. Do not remove adhesive strips completely unless your health care provider tells you to do that. Do not wear tight clothing over the incisions. Tight clothing may rub and irritate the incision areas, which may cause the incisions to open. Do not take baths, swim, or use a hot tub until your health care provider approves. OK TO SHOWER IN 24HRS.   Check your incision area every day for signs of infection. Check for: More redness, swelling, or pain. More fluid or blood. Warmth. Pus or a bad smell. Activity Avoid lifting anything that is heavier than 10 lb (4.5 kg) for 2 weeks or until your health care provider says it is okay. No pushing/pulling greater than 30lbs You may resume normal activities as told by your health care provider. Ask your health care provider what activities are safe for you. Take rest breaks during the day as needed. Eating and drinking Follow instructions from your health care provider about what you can eat after surgery. To prevent or treat constipation while you are taking prescription pain medicine, your health care provider may recommend that you: Drink enough fluid to keep your urine clear or pale yellow. Take over-the-counter or prescription medicines. Eat foods that are high in fiber, such as fresh fruits and vegetables, whole grains, and beans. Limit foods that are high in fat and processed sugars, such as fried and sweet foods. General instructions Ask your health care provider when you will need an appointment to get your sutures or staples removed. Keep all follow-up  visits as told by your health care provider. This is important. Contact a health care provider if: You have more redness, swelling, or pain around your incisions. You have more fluid or blood coming from the incisions. Your incisions feel warm to the touch. You have pus or a bad smell coming from your incisions or your dressing. You have a fever. You have  an incision that breaks open (edges not staying together) after sutures or staples have been removed. You develop a rash. You have chest pain or difficulty breathing. You have pain or swelling in your legs. You feel light-headed or you faint. Your abdomen swells (becomes distended). You have nausea or vomiting. You have blood in your stool (feces). This information is not intended to replace advice given to you by your health care provider. Make sure you discuss any questions you have with your health care provider. Document Released: 02/23/2005 Document Revised: 04/25/2018 Document Reviewed: 05/07/2016 Elsevier Interactive Patient Education  2019 [place]   The drugs that you were given will stay in your system until tomorrow so for the next 24 hours you should not:  Drive an automobile Make any legal decisions Drink any alcoholic beverage   You may resume regular meals tomorrow.  Today it is better to start with liquids and gradually work up to solid foods.  You may eat anything you prefer, but it is better to start with liquids, then soup and crackers, and gradually work up to solid foods.   Please notify your doctor immediately if you have any unusual bleeding, trouble breathing, redness and pain at the surgery site, drainage, fever, or pain not relieved by medication.    Additional Instructions:        Please contact your physician with any problems or Same Day Surgery at [phone], Monday through Friday 6 am to 4 pm, or [place] at [facility] number at [phone].

## 2021-09-22 NOTE — Transfer of Care (Signed)
Immediate Anesthesia Transfer of Care Note  Patient: Tony Mitchell  Procedure(s) Performed: XI ROBOTIC ASSISTED INGUINAL HERNIA (Left: Groin) INSERTION OF MESH  Patient Location: PACU  Anesthesia Type:General  Level of Consciousness: awake, alert  and oriented  Airway & Oxygen Therapy: Patient Spontanous Breathing and Patient connected to nasal cannula oxygen  Post-op Assessment: Report given to RN and Post -op Vital signs reviewed and stable  Post vital signs: Reviewed and stable  Last Vitals:  Vitals Value Taken Time  BP 154/73 09/22/21 1334  Temp    Pulse 80 09/22/21 1334  Resp 18 09/22/21 1334  SpO2 100 % 09/22/21 1334    Last Pain:  Vitals:   09/22/21 1001  TempSrc: Temporal  PainSc: 0-No pain         Complications: No notable events documented.

## 2021-09-22 NOTE — Op Note (Signed)
Preoperative diagnosis: LEFT inguinal Hernia, INITIAL REDUCIBLE  Postoperative diagnosis:  left indirect inguinal Hernia  Procedure: Robotic assisted laparoscopic left inguinal hernia repair with mesh  Anesthesia: General  Surgeon: Dr. Lysle Pearl  Wound Classification: Clean  Specimen: none  Complications: None  Estimated Blood Loss: 66mL   Indications:  inguinal hernia. Repair was indicated to avoid complications of incarceration, obstruction and pain, and a prosthetic mesh repair was elected.  See H&P for further details.  Findings: 1. Vas Deferens and cord structures identified and preserved 2. Bard 3D max medium weight mesh used for repair 3. Adequate hemostasis achieved  Description of procedure: The patient was taken to the operating room. A time-out was completed verifying correct patient, procedure, site, positioning, and implant(s) and/or special equipment prior to beginning this procedure.  Area was prepped and draped in the usual sterile fashion. An incision was marked 20 cm above the pubic tubercle, slightly above the umbilicus  Scrotum wrapped in Kerlix roll.  Veress needle inserted at palmer's point.  Saline drop test noted to be positive with gradual increase in pressure after initiation of gas insufflation.  15 mm of pressure was achieved prior to removing the Veress needle and then placing a 8 mm port via the Optiview technique through the supraumbilical site.  Inspection of the area afterwards noted no injury to the surrounding organs during insertion of the needle and the port.  2 port sites were marked 8 cm to the lateral sides of the initial port, and a 8 mm robotic port was placed on the left side, another 8 mm robotic port on the right side under direct supervision.  Local anesthesia  infused to the preplanned incision sites prior to insertion of the port.  The [place] was then brought into the operative field and docked to the ports.  Examination of the  abdominal cavity noted a left inguinal hernia.  A peritoneal flap was created approximately 8cm cephalad to the defect by using scissors with electrocautery.  Dissection was carried down towards the pubic tubercle, developing the myopectineal orifice view.  Laterally the flap was carried towards the ASIS.  Moderate size hernia sac was noted, which carefully dissected away from the adjacent tissues to be fully reduced out of hernia cavity.  Any bleeding was controlled with combination of electrocautery and manual pressure.    After confirming adequate dissection and the peritoneal reflection completely down and away from the cord structures, a Large Bard 3DMax medium weight mesh was placed within the anterior abdominal wall, secured in place using 2-0 Vicryl on an SH needle immediately above the pubic tubercle.  After noting proper placement of the mesh with the peritoneal reflection deep to it, the previously created peritoneal flap was secured back up to the anterior abdominal wall using running 3-0 V-Lock.  Both needles were then removed out of the abdominal cavity, Xi platform undocked from the ports and removed off of operative field.  exparel infused as ilioinguinal block.  Abdomen then desufflated and ports removed. All the skin incisions were then closed with a subcuticular stitch of Monocryl 4-0. Dermabond was applied. The testis was gently pulled down into its anatomic position in the scrotum.  The patient tolerated the procedure well and was taken to the postanesthesia care unit in stable condition. Sponge and instrument count correct at end of procedure.

## 2021-09-22 NOTE — Anesthesia Postprocedure Evaluation (Signed)
Anesthesia Post Note  Patient: Tony Mitchell  Procedure(s) Performed: XI ROBOTIC ASSISTED INGUINAL HERNIA (Left: Groin) INSERTION OF MESH  Patient location during evaluation: Endoscopy Anesthesia Type: General Level of consciousness: combative Pain management: pain level controlled Vital Signs Assessment: post-procedure vital signs reviewed and stable Respiratory status: spontaneous breathing, nonlabored ventilation, respiratory function stable and patient connected to nasal cannula oxygen Cardiovascular status: blood pressure returned to baseline and stable Postop Assessment: no apparent nausea or vomiting Anesthetic complications: no   No notable events documented.   Last Vitals:  Vitals:   09/22/21 1400 09/22/21 1405  BP: 125/66 125/66  Pulse: 70 76  Resp: 14 11  Temp:  36.7 C  SpO2: 100% 100%    Last Pain:  Vitals:   09/22/21 1405  TempSrc:   PainSc: 0-No pain                 Precious Haws Ludmila Ebarb

## 2021-09-22 NOTE — Anesthesia Procedure Notes (Signed)
Procedure Name: Intubation Date/Time: 09/22/2021 11:37 AM Performed by: Johnna Acosta, CRNA Pre-anesthesia Checklist: Patient identified, Emergency Drugs available, Suction available, Patient being monitored and Timeout performed Patient Re-evaluated:Patient Re-evaluated prior to induction Oxygen Delivery Method: Circle system utilized Preoxygenation: Pre-oxygenation with 100% oxygen Induction Type: IV induction Ventilation: Mask ventilation without difficulty Laryngoscope Size: McGraph and 3 Grade View: Grade I Tube type: Oral Tube size: 7.5 mm Number of attempts: 1 Airway Equipment and Method: Stylet and Video-laryngoscopy Placement Confirmation: ETT inserted through vocal cords under direct vision, positive ETCO2 and breath sounds checked- equal and bilateral Secured at: 21 cm Tube secured with: Tape Dental Injury: Teeth and Oropharynx as per pre-operative assessment

## 2021-09-22 NOTE — Interval H&P Note (Signed)
History and Physical Interval Note:  09/22/2021 10:15 AM  Tony Mitchell  has presented today for surgery, with the diagnosis of Non-recurrent unilateral inguinal hernia without obstruction or gangrene K40.90.  The various methods of treatment have been discussed with the patient and family. After consideration of risks, benefits and other options for treatment, the patient has consented to  Procedure(s): XI ROBOTIC [place] (Left) as a surgical intervention.  The patient's history has been reviewed, patient examined, no change in status, stable for surgery.  I have reviewed the patient's chart and labs.  Questions were answered to the patient's satisfaction.     Tuff Clabo Lysle Pearl

## 2021-09-22 NOTE — Anesthesia Preprocedure Evaluation (Signed)
Anesthesia Evaluation  Patient identified by MRN, date of birth, ID band Patient awake    Reviewed: Allergy & Precautions, NPO status , Patient's Chart, lab work & pertinent test results  History of Anesthesia Complications Negative for: history of anesthetic complications  Airway Mallampati: III  TM Distance: >3 FB Neck ROM: full    Dental  (+) Chipped, Poor Dentition, Missing   Pulmonary neg shortness of breath, sleep apnea , COPD, former smoker,    Pulmonary exam normal        Cardiovascular hypertension, + CAD  negative cardio ROS Normal cardiovascular exam     Neuro/Psych  Neuromuscular disease negative psych ROS   GI/Hepatic Neg liver ROS, GERD  Controlled,  Endo/Other  diabetes, Type 2  Renal/GU Renal disease     Musculoskeletal  (+) Arthritis ,   Abdominal   Peds  Hematology negative hematology ROS (+)   Anesthesia Other Findings Past Medical History: 03/03/2018: Acute appendicitis No date: Arthritis No date: Cancer [facility])     Comment:  skin cancer No date: Chronic kidney disease, stage 3b ([place]) 2019: Collapsed lung     Comment:  x2 No date: COPD (chronic obstructive pulmonary disease) (HCC) No date: Coronary artery disease No date: Diabetes mellitus without complication (HCC)     Comment:  diet controlled-lost 60 lbs and is not on any current               meds No date: GERD (gastroesophageal reflux disease) No date: Gout No date: Hyperlipidemia No date: Hypertension No date: Sleep apnea     Comment:  uses cpap No date: Thoracic aortic aneurysm No date: Vitamin B 12 deficiency  Past Surgical History: No date: CARDIAC CATHETERIZATION 2012: CATARACT EXTRACTION No date: CHEST TUBE INSERTION     Comment:  x2 04/14/2021: COLONOSCOPY WITH PROPOFOL; N/A     Comment:  Procedure: COLONOSCOPY WITH PROPOFOL;  Surgeon:               Lesly Rubenstein, MD;  Location: [facility] ENDOSCOPY;                 Service: Endoscopy;  Laterality: N/A; 04/14/2021: ESOPHAGOGASTRODUODENOSCOPY (EGD) WITH PROPOFOL; N/A     Comment:  Procedure: ESOPHAGOGASTRODUODENOSCOPY (EGD) WITH               PROPOFOL;  Surgeon: Lesly Rubenstein, MD;  Location:               [facility] ENDOSCOPY;  Service: Endoscopy;  Laterality: N/A; No date: JOINT REPLACEMENT 1965: KNEE SURGERY; Left 03/03/2018: LAPAROSCOPIC APPENDECTOMY; N/A     Comment:  Procedure: APPENDECTOMY LAPAROSCOPIC;  Surgeon: Vickie Epley, MD;  Location: [facility] ORS;  Service: General;                Laterality: N/A; 09/16/2018: TOTAL KNEE ARTHROPLASTY; Left     Comment:  Procedure: TOTAL KNEE ARTHROPLASTY;  Surgeon: Corky Mull, MD;  Location: [facility] ORS;  Service: Orthopedics;                Laterality: Left;     Reproductive/Obstetrics negative OB ROS                             Anesthesia Physical Anesthesia Plan  ASA: 3  Anesthesia Plan:  General ETT   Post-op Pain Management:    Induction: Intravenous  PONV Risk Score and Plan: Ondansetron, Dexamethasone, Midazolam and Treatment may vary due to age or medical condition  Airway Management Planned: Oral ETT  Additional Equipment:   Intra-op Plan:   Post-operative Plan: Extubation in OR  Informed Consent: I have reviewed the patients History and Physical, chart, labs and discussed the procedure including the risks, benefits and alternatives for the proposed anesthesia with the patient or authorized representative who has indicated his/her understanding and acceptance.     Dental Advisory Given  Plan Discussed with: Anesthesiologist, CRNA and Surgeon  Anesthesia Plan Comments: (Patient consented for risks of anesthesia including but not limited to:  - adverse reactions to medications - damage to eyes, teeth, lips or other oral mucosa - nerve damage due to positioning  - sore throat or hoarseness - Damage to heart, brain,  nerves, lungs, other parts of body or loss of life  Patient voiced understanding.)        Anesthesia Quick Evaluation

## 2021-09-25 ENCOUNTER — Encounter: Payer: Self-pay | Admitting: Surgery

## 2021-10-31 ENCOUNTER — Encounter: Payer: Self-pay | Admitting: Urology

## 2021-10-31 ENCOUNTER — Ambulatory Visit (INDEPENDENT_AMBULATORY_CARE_PROVIDER_SITE_OTHER): Payer: Medicare Other | Admitting: Urology

## 2021-10-31 ENCOUNTER — Other Ambulatory Visit: Payer: Self-pay

## 2021-10-31 ENCOUNTER — Other Ambulatory Visit
Admission: RE | Admit: 2021-10-31 | Discharge: 2021-10-31 | Disposition: A | Discharge status: Home / Self Care | Payer: Medicare Other | Attending: Urology | Admitting: Urology

## 2021-10-31 ENCOUNTER — Other Ambulatory Visit: Payer: Self-pay | Admitting: *Deleted

## 2021-10-31 VITALS — BP 160/75 | HR 90 | Ht 68.0 in | Wt 144.8 lb

## 2021-10-31 DIAGNOSIS — N401 Enlarged prostate with lower urinary tract symptoms: Secondary | ICD-10-CM | POA: Diagnosis not present

## 2021-10-31 DIAGNOSIS — R351 Nocturia: Secondary | ICD-10-CM | POA: Diagnosis present

## 2021-10-31 DIAGNOSIS — R972 Elevated prostate specific antigen [PSA]: Secondary | ICD-10-CM | POA: Diagnosis not present

## 2021-10-31 DIAGNOSIS — N138 Other obstructive and reflux uropathy: Secondary | ICD-10-CM | POA: Diagnosis not present

## 2021-10-31 LAB — BLADDER SCAN AMB NON-IMAGING

## 2021-10-31 LAB — URINALYSIS, COMPLETE (UACMP) WITH MICROSCOPIC
Bacteria, UA: NONE SEEN
Bilirubin Urine: NEGATIVE
Glucose, UA: NEGATIVE mg/dL
Hgb urine dipstick: NEGATIVE
Ketones, ur: NEGATIVE mg/dL
Leukocytes,Ua: NEGATIVE
Nitrite: NEGATIVE
Protein, ur: NEGATIVE mg/dL
Specific Gravity, Urine: 1.015 (ref 1.005–1.030)
pH: 7 (ref 5.0–8.0)

## 2021-10-31 MED ORDER — TAMSULOSIN HCL 0.4 MG PO CAPS: 0.4000 mg | ORAL_CAPSULE | Freq: Every day | ORAL | 11 refills | Status: DC

## 2021-10-31 NOTE — Progress Notes (Signed)
? ?  10/31/2021 ?10:17 AM  ? ?Tony Mitchell ?Oct 30, 1940 ?[id number] ? ?Reason for visit: Nocturia, PSA screening ? ?HPI: ?I saw Tony Mitchell back in clinic for follow-up of the above issues.  I last saw him in August 2021 for " elevated PSA" and he had had a very long history of elevated PSA ranging from 6-9 including a PSA of 8.2 with 32% free in 2017, and a history of a negative prostate biopsy.  He was referred back to urology at that time for a PSA of 7.96 in August 2021 which was stable over the last 5 years.  At that visit we had reviewed the AUA guidelines that recommended against routine screening in men over age 34, and that his PSA trend was very reassuring including the negative biopsy, stability, and elevated percentage free indicating a benign process.  DRE was also benign. ? ?His PCP has continued to check PSA, which has overall been stable including 7.63 in August 2022, 8.57 in November 2022, and 5.96 in March 2023. ? ?He had a CT abdomen and pelvis with contrast in July 2020, and I personally viewed and interpreted those images that show a 61 g prostate, with no hydronephrosis or other urologic abnormalities. ? ?His primary complaint today is nocturia 2-5 times overnight over the last 6 months.  He continues to be compliant with his CPAP machine.  He drinks primarily water during the day and some tea in the afternoon or evening.  He has had some improvement by limiting fluids after 7 PM.  He denies any lower extremity edema, dysuria.  Occasionally will have some weak urinary stream. Urinalysis today is completely benign, PVR normal at 110 mL. ? ?We reviewed behavioral strategies regarding nocturia, and I recommended a trial of Flomax to see if this improves his urinary symptoms.  Risks and benefits discussed.  Reassurance was provided regarding his PSA, and we reviewed the guidelines that do not recommend routine screening in men over age 20, and that his PSA has been extremely stable, and he  has history of negative biopsy, and elevated percentage free that indicate a most likely benign process, and DRE was benign. ? ?Trial of Flomax for urinary symptoms, behavioral strategies discussed regarding nocturia ?RTC 3 months IPSS/PVR, symptom check ?Consider OAB medication or cystoscopy in the future if persistently bothersome urinary symptoms ? ?Tony Co, MD ? ?[facility] ?[address] ?[place],  [address] ?[phone] ? ? ?

## 2021-10-31 NOTE — Patient Instructions (Signed)
Minimize fluids 4 hours prior to bedtime, and urinate right before going to bed.  Avoid tea or other bladder irritants in the afternoon or evening, as these can cause increased urinary frequency overnight.   ? ?Benign Prostatic Hyperplasia ?Benign prostatic hyperplasia (BPH) is an enlarged prostate gland that is caused by the normal aging process. The prostate may get bigger as a man gets older. The condition is not caused by cancer. The prostate is a walnut-sized gland that is involved in the production of semen. It is located in front of the rectum and below the bladder. The bladder stores urine. The urethra carries stored urine out of the body. ?An enlarged prostate can press on the urethra. This can make it harder to pass urine. The buildup of urine in the bladder can cause infection. Back pressure and infection may progress to bladder damage and kidney (renal) failure. ?What are the causes? ?This condition is part of the normal aging process. However, not all men develop problems from this condition. If the prostate enlarges away from the urethra, urine flow will not be blocked. If it enlarges toward the urethra and compresses it, there will be problems passing urine. ?What increases the risk? ?This condition is more likely to develop in men older than 50 years. ?What are the signs or symptoms? ?Symptoms of this condition include: ?Getting up often during the night to urinate. ?Needing to urinate frequently during the day. ?Difficulty starting urine flow. ?Decrease in size and strength of your urine stream. ?Leaking (dribbling) after urinating. ?Inability to pass urine. This needs immediate treatment. ?Inability to completely empty your bladder. ?Pain when you pass urine. This is more common if there is also an infection. ?Urinary tract infection (UTI). ?How is this diagnosed? ?This condition is diagnosed based on your medical history, a physical exam, and your symptoms. Tests will also be done, such as: ?A  post-void bladder scan. This measures any amount of urine that may remain in your bladder after you finish urinating. ?A digital rectal exam. In a rectal exam, your health care provider checks your prostate by putting a lubricated, gloved finger into your rectum to feel the back of your prostate gland. This exam detects the size of your gland and any abnormal lumps or growths. ?An exam of your urine (urinalysis). ?A prostate specific antigen (PSA) screening. This is a blood test used to screen for prostate cancer. ?An ultrasound. This test uses sound waves to electronically produce a picture of your prostate gland. ?Your health care provider may refer you to a specialist in kidney and prostate diseases (urologist). ?How is this treated? ?Once symptoms begin, your health care provider will monitor your condition (active surveillance or watchful waiting). Treatment for this condition will depend on the severity of your condition. Treatment may include: ?Observation and yearly exams. This may be the only treatment needed if your condition and symptoms are mild. ?Medicines to relieve your symptoms, including: ?Medicines to shrink the prostate. ?Medicines to relax the muscle of the prostate. ?Surgery in severe cases. Surgery may include: ?Prostatectomy. In this procedure, the prostate tissue is removed completely through an open incision or with a laparoscope or robotics. ?Transurethral resection of the prostate (TURP). In this procedure, a tool is inserted through the opening at the tip of the penis (urethra). It is used to cut away tissue of the inner core of the prostate. The pieces are removed through the same opening of the penis. This removes the blockage. ?Transurethral incision (TUIP). In this  procedure, small cuts are made in the prostate. This lessens the prostate's pressure on the urethra. ?Transurethral microwave thermotherapy (TUMT). This procedure uses microwaves to create heat. The heat destroys and  removes a small amount of prostate tissue. ?Transurethral needle ablation (TUNA). This procedure uses radio frequencies to destroy and remove a small amount of prostate tissue. ?Interstitial laser coagulation ([place]). This procedure uses a laser to destroy and remove a small amount of prostate tissue. ?Transurethral electrovaporization (TUVP). This procedure uses electrodes to destroy and remove a small amount of prostate tissue. ?Prostatic urethral lift. This procedure inserts an implant to push the lobes of the prostate away from the urethra. ?Follow these instructions at home: ?Take over-the-counter and prescription medicines only as told by your health care provider. ?Monitor your symptoms for any changes. Contact your health care provider with any changes. ?Avoid drinking large amounts of liquid before going to bed or out in public. ?Avoid or reduce how much caffeine or alcohol you drink. ?Give yourself time when you urinate. ?Keep all follow-up visits. This is important. ?Contact a health care provider if: ?You have unexplained back pain. ?Your symptoms do not get better with treatment. ?You develop side effects from the medicine you are taking. ?Your urine becomes very dark or has a bad smell. ?Your lower abdomen becomes distended and you have trouble passing urine. ?Get help right away if: ?You have a fever or chills. ?You suddenly cannot urinate. ?You feel light-headed or very dizzy, or you faint. ?There are large amounts of blood or clots in your urine. ?Your urinary problems become hard to manage. ?You develop moderate to severe low back or flank pain. The flank is the side of your body between the ribs and the hip. ?These symptoms may be an emergency. Get help right away. Call 911. ?Do not wait to see if the symptoms will go away. ?Do not drive yourself to the hospital. ?Summary ?Benign prostatic hyperplasia (BPH) is an enlarged prostate that is caused by the normal aging process. It is not caused by  cancer. ?An enlarged prostate can press on the urethra. This can make it hard to pass urine. ?This condition is more likely to develop in men older than 50 years. ?Get help right away if you suddenly cannot urinate. ?This information is not intended to replace advice given to you by your health care provider. Make sure you discuss any questions you have with your health care provider. ?Document Revised: 02/22/2021 Document Reviewed: 02/22/2021 ?Elsevier Patient Education ? [employer]. ? ?

## 2021-11-20 IMAGING — CT CT ABD-PELV W/ CM
1 of 3 series · 13 of 32 positions shown, 18 images · IV contrast (APPLIED)
Comparison: 03/25/2018

CLINICAL DATA: 80-year-old with unintentional weight loss.

EXAM:
CT ABDOMEN AND PELVIS WITH CONTRAST
TECHNIQUE: Multidetector CT imaging of the abdomen and pelvis was performed
using the standard protocol following bolus administration of
intravenous contrast.
CONTRAST:  80mL OMNIPAQUE IOHEXOL 300 MG/ML  SOLN

[Series 2: axial st · axial · 0.80mm/px · z∈[-934,-549]mm · 13 of 87 slices shown, 18 images]
[im 5/87  soft-tissue]
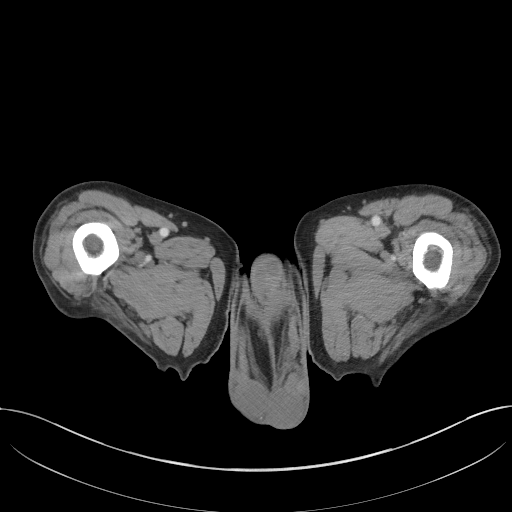
[im 5/87  bone]
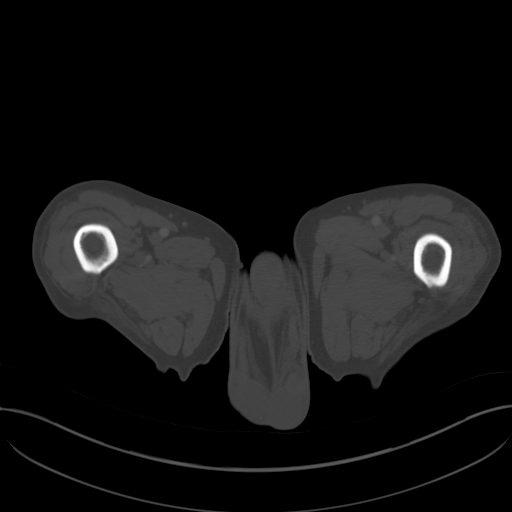
[im 15/87  soft-tissue]
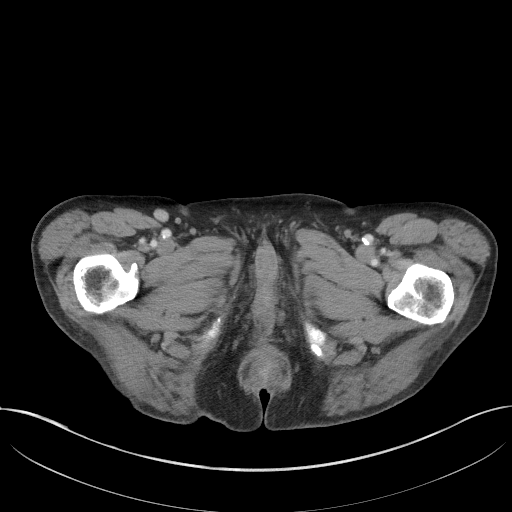
[im 20/87  soft-tissue]
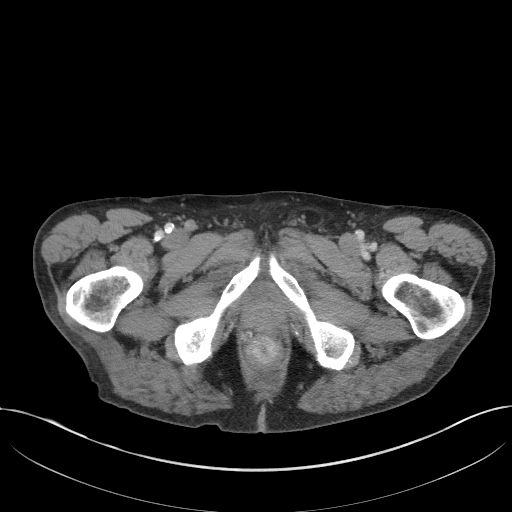
[im 24/87  soft-tissue]
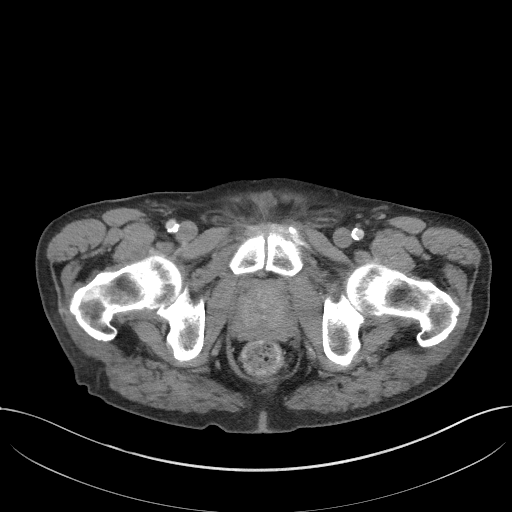
[im 34/87  soft-tissue]
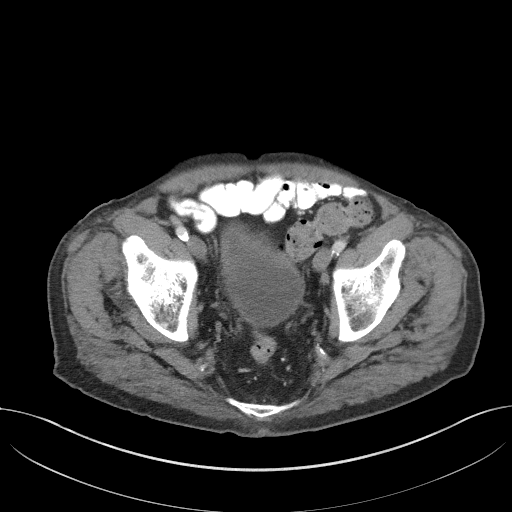
[im 39/87  soft-tissue]
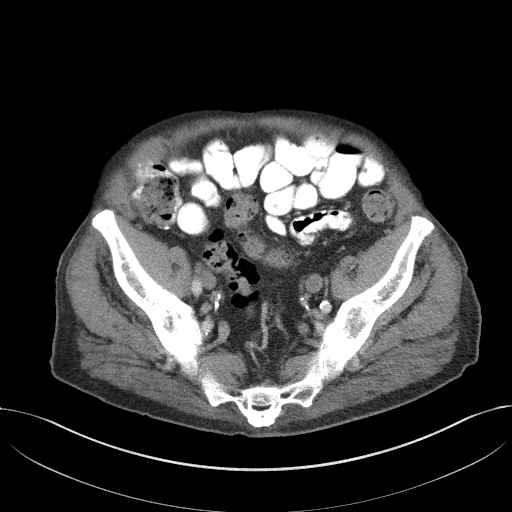
[im 48/87  soft-tissue]
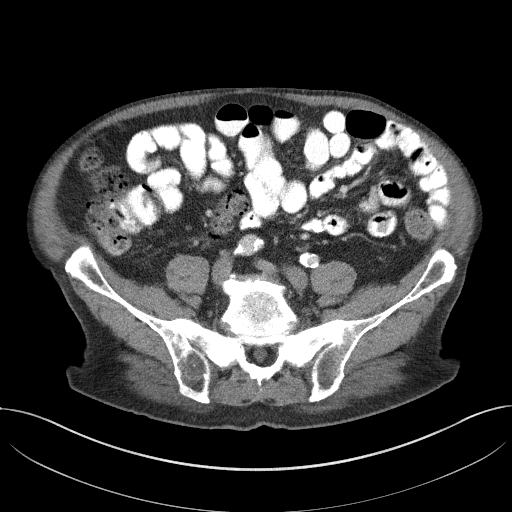
[im 53/87  soft-tissue]
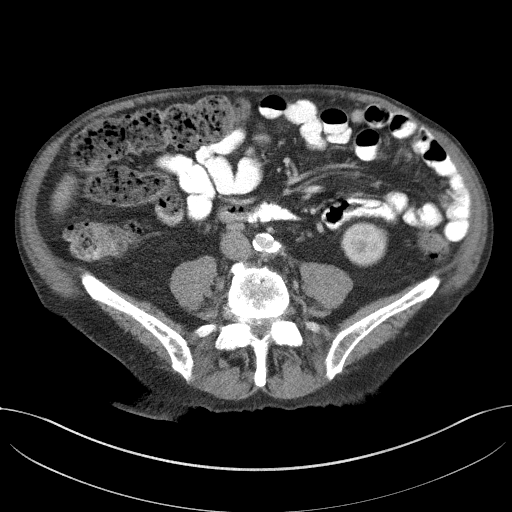
[im 63/87  soft-tissue]
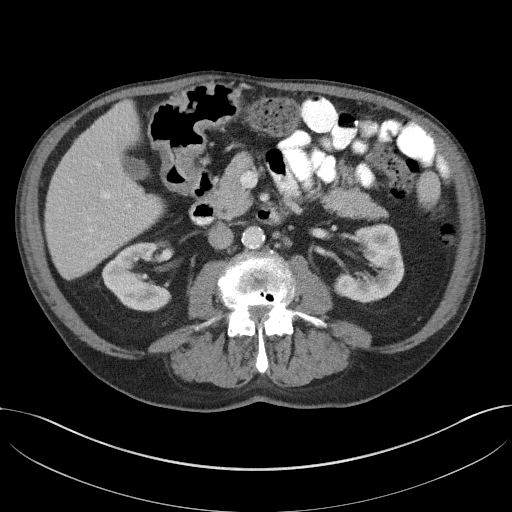
[im 63/87  bone]
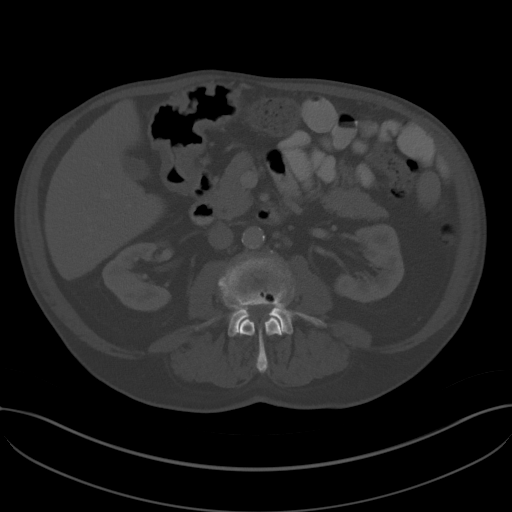
[im 67/87  soft-tissue]
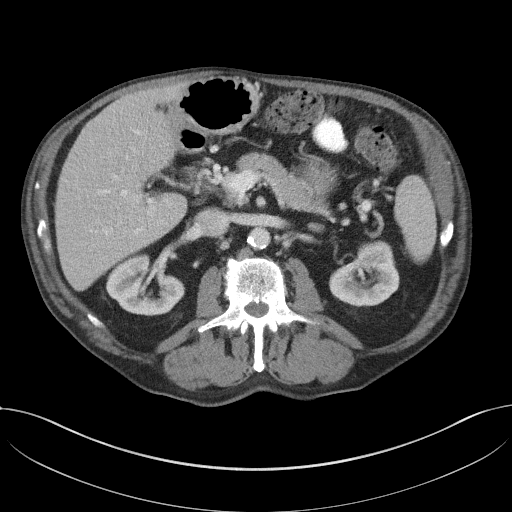
[im 67/87  lung]
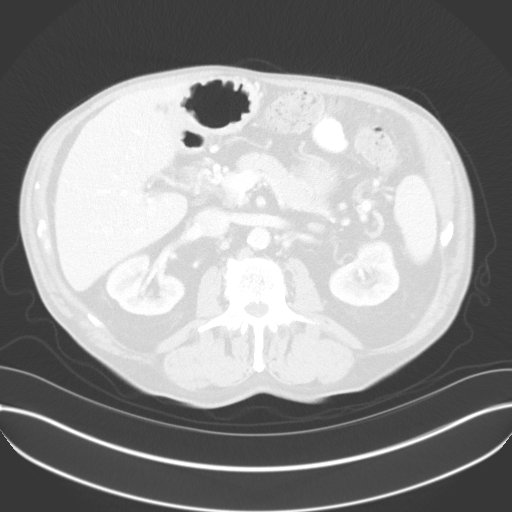
[im 72/87  soft-tissue]
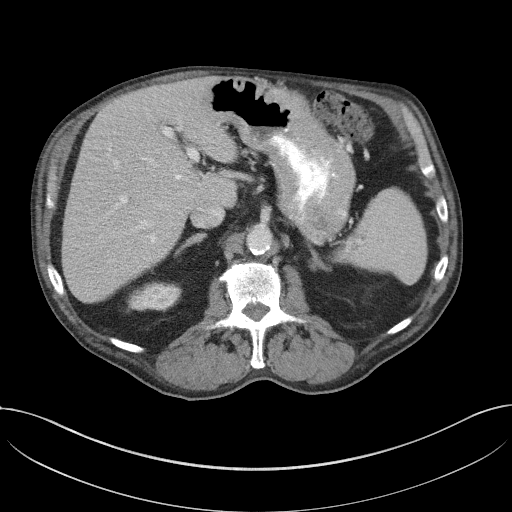
[im 72/87  lung]
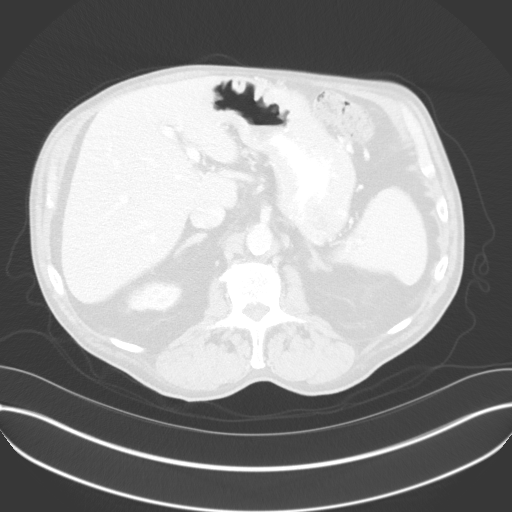
[im 77/87  lung]
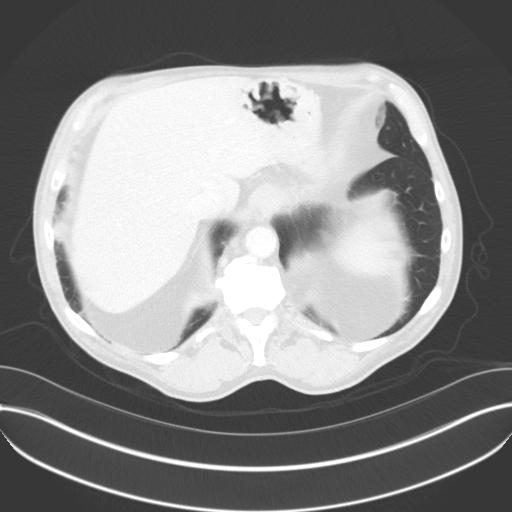
[im 82/87  soft-tissue]
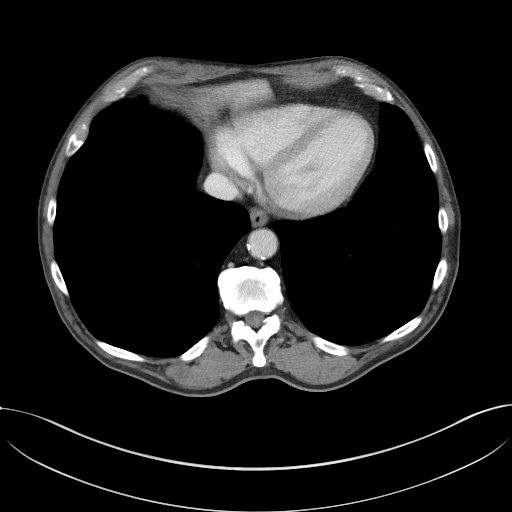
[im 82/87  lung]
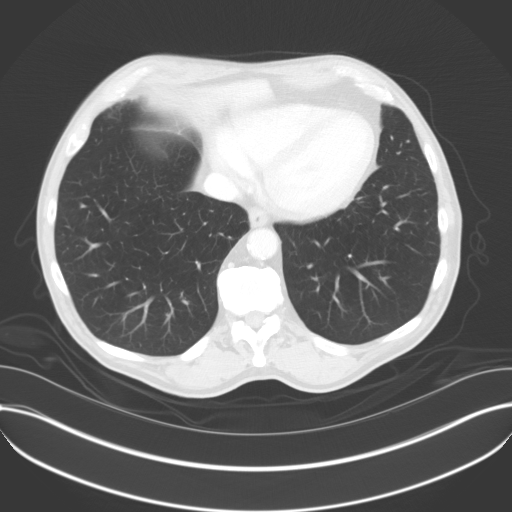

[13 of 32 positions shown; findings below may reference images not displayed]

FINDINGS: Lower chest: Lung bases are clear.

Hepatobiliary: Normal appearance of the liver. Tiny hypodensity
along the inferior right hepatic lobe likely represents a small
cyst. Normal appearance of the gallbladder. Portal venous system is
patent. No biliary dilatation.

Pancreas: Unremarkable. No pancreatic ductal dilatation or
surrounding inflammatory changes.

Spleen: Normal in size without focal abnormality.

Adrenals/Urinary Tract: Normal adrenal glands. Normal appearance of
the urinary bladder. Normal appearance of both kidneys without
hydronephrosis. No suspicious renal lesions.

Stomach/Bowel: Moderate amount of stool in the colon. There is oral
contrast in the small bowel and at the cecum. No evidence for a
bowel obstruction. Diffuse wall thickening involving the proximal
gastric body region but there is no perigastric inflammation.
However, this gastric wall thickening is different from the exam in
6961 but could still be related to under distension.

Vascular/Lymphatic: Atherosclerotic calcifications in the abdominal
aorta without aneurysm. No significant lymph node enlargement in the
abdomen or pelvis. Small retroperitoneal lymph nodes are similar to
the prior examination.

Reproductive: Prostate is prominent for size measuring 4.9 cm in the
transverse dimension.

Other: Negative for ascites.  Negative for free air.

Musculoskeletal: Areas of mild disc space narrowing in the lumbar
spine. No acute bone abnormality.
IMPRESSION: 1. Indeterminate wall thickening in the proximal stomach. This area
appears different compared to the previous examination but could
also be related to under distension. Recommend GI consultation.
2.  Aortic Atherosclerosis ([id number]-UQ9.9).
3. Prostate enlargement.

## 2022-01-30 ENCOUNTER — Ambulatory Visit (INDEPENDENT_AMBULATORY_CARE_PROVIDER_SITE_OTHER): Payer: Medicare Other | Admitting: Urology

## 2022-01-30 ENCOUNTER — Encounter: Payer: Self-pay | Admitting: Urology

## 2022-01-30 VITALS — BP 151/63 | HR 70 | Ht 68.0 in | Wt 144.0 lb

## 2022-01-30 DIAGNOSIS — N138 Other obstructive and reflux uropathy: Secondary | ICD-10-CM | POA: Diagnosis not present

## 2022-01-30 DIAGNOSIS — G4730 Sleep apnea, unspecified: Secondary | ICD-10-CM | POA: Diagnosis not present

## 2022-01-30 DIAGNOSIS — R351 Nocturia: Secondary | ICD-10-CM

## 2022-01-30 DIAGNOSIS — N401 Enlarged prostate with lower urinary tract symptoms: Secondary | ICD-10-CM

## 2022-01-30 LAB — BLADDER SCAN AMB NON-IMAGING

## 2022-01-30 MED ORDER — FINASTERIDE 5 MG PO TABS: 5.0000 mg | ORAL_TABLET | Freq: Every day | ORAL | 3 refills | Status: DC

## 2022-01-30 NOTE — Patient Instructions (Signed)

## 2022-01-30 NOTE — Progress Notes (Signed)
   01/30/2022 11:20 AM   Tony Mitchell 1941-01-04 [id number]  Reason for visit: Nocturia, BPH, PSA screening  HPI: 81 year old male here today for follow-up of the above issues.  Regarding the PSA, he has a long history of an elevated PSA ranging from 6-9 including a PSA of 8.2 with 32% free in 217, and a history of a negative prostate biopsy.  PSA has essentially been stable since that time, most recently 4.82 in April 2023 which is normal for his age.  We previously have reviewed his reassuring PSA trend, and he deferred further evaluation per AUA guideline recommendations which is very reasonable.  At our last visit we trialed Flomax for his bothersome nocturia 3-6 times overnight.  He noticed remarkable improvement over the first few days and was only getting up once per night, but that has now increased to 2-3 times overnight.  Overall he feels he is doing pretty well.  He is compliant with his CPAP machine.  He denies any lower extremity edema.  PVR is normal at 26 mL today.  He minimizes fluids before bed, and avoids bladder irritants.  He had a CT abdomen and pelvis with contrast in July 2020, and I personally viewed and interpreted those images that show a 61 g prostate, with no hydronephrosis or other urologic abnormalities.  We discussed options regarding his persistently bothersome urinary symptoms including continuing Flomax, increasing Flomax dose, addition of finasteride, or cystoscopy and transrectal ultrasound for consideration of outlet procedures like UroLift or HOLEP.  Using shared decision making, he is interested in a trial of finasteride at this time, and risks and benefits were discussed.  Continue Flomax, finasteride added Encouraged to have CPAP machine checked RTC 6 months symptom check IPSS, PVR  Billey Co, MD  [facility] [address] [place], [place] [address] [phone]

## 2022-07-31 ENCOUNTER — Ambulatory Visit (INDEPENDENT_AMBULATORY_CARE_PROVIDER_SITE_OTHER): Payer: Medicare Other | Admitting: Urology

## 2022-07-31 ENCOUNTER — Encounter: Payer: Self-pay | Admitting: Urology

## 2022-07-31 VITALS — BP 151/70 | HR 67 | Ht 68.0 in | Wt 146.0 lb

## 2022-07-31 DIAGNOSIS — Z125 Encounter for screening for malignant neoplasm of prostate: Secondary | ICD-10-CM

## 2022-07-31 DIAGNOSIS — N401 Enlarged prostate with lower urinary tract symptoms: Secondary | ICD-10-CM | POA: Diagnosis not present

## 2022-07-31 DIAGNOSIS — R351 Nocturia: Secondary | ICD-10-CM | POA: Diagnosis not present

## 2022-07-31 DIAGNOSIS — N138 Other obstructive and reflux uropathy: Secondary | ICD-10-CM | POA: Diagnosis not present

## 2022-07-31 DIAGNOSIS — R972 Elevated prostate specific antigen [PSA]: Secondary | ICD-10-CM

## 2022-07-31 LAB — BLADDER SCAN AMB NON-IMAGING

## 2022-07-31 MED ORDER — FINASTERIDE 5 MG PO TABS: 5.0000 mg | ORAL_TABLET | Freq: Every day | ORAL | 3 refills | Status: DC

## 2022-07-31 MED ORDER — TAMSULOSIN HCL 0.4 MG PO CAPS: 0.4000 mg | ORAL_CAPSULE | Freq: Every day | ORAL | 11 refills | Status: DC

## 2022-07-31 NOTE — Progress Notes (Signed)
   07/31/2022 10:23 AM   [place] Mar 27, 1941 [id number]  Reason for visit: Follow up BPH, nocturia, history of elevated PSA  HPI: 81 year old male here for follow-up of the above issues.  He has a long history of an elevated PSA that has ranged from 6-9, including a PSA of 8.2 with 32% free, and a history of a negative prostate biopsy.  PSA has actually decreased since that time, most recently 4.8 in April 2023 which is normal for his age.  We previously have reviewed his reassuring PSA trend, and he opted to discontinue screening per the AUA guidelines which is very reasonable.  Regarding his urinary symptoms he was originally having bothersome nocturia 3-6 times overnight.  We started Flomax in March 2023 and he noticed significant improvement.  He is compliant with his CPAP machine.  PVRs have been normal, including 68m today.  At our last visit he opted to trial finasteride in addition to Flomax.  He reports about a 30% improvement on the finasteride.  Really only bothersome urinary symptom is nocturia 1-2 times overnight, and occasional urgency during the day.  IPSS score today 5, with quality of life mostly satisfied.  Prior CT from July 2020 showed a 60 g prostate with no hydronephrosis or other urologic abnormalities.  Flomax and finasteride refilled Behavioral strategies discussed regarding nocturia RTC 1 year PVR  BBilley Co MD  [facility] [place] [phone]

## 2022-07-31 NOTE — Patient Instructions (Signed)

## 2022-10-15 ENCOUNTER — Other Ambulatory Visit: Payer: Self-pay | Admitting: Urology

## 2022-10-15 DIAGNOSIS — Z125 Encounter for screening for malignant neoplasm of prostate: Secondary | ICD-10-CM

## 2022-10-15 DIAGNOSIS — R351 Nocturia: Secondary | ICD-10-CM

## 2022-10-15 DIAGNOSIS — N138 Other obstructive and reflux uropathy: Secondary | ICD-10-CM

## 2022-11-13 ENCOUNTER — Other Ambulatory Visit: Payer: Self-pay

## 2022-11-13 ENCOUNTER — Ambulatory Visit
Admission: EM | Admit: 2022-11-13 | Discharge: 2022-11-13 | Disposition: A | Discharge status: Home / Self Care | Payer: Medicare Other | Attending: Physician Assistant | Admitting: Physician Assistant

## 2022-11-13 ENCOUNTER — Ambulatory Visit (INDEPENDENT_AMBULATORY_CARE_PROVIDER_SITE_OTHER): Payer: Medicare Other

## 2022-11-13 DIAGNOSIS — R042 Hemoptysis: Secondary | ICD-10-CM | POA: Diagnosis not present

## 2022-11-13 DIAGNOSIS — J449 Chronic obstructive pulmonary disease, unspecified: Secondary | ICD-10-CM

## 2022-11-13 NOTE — ED Triage Notes (Signed)
Pt states he is coughing up blood that started this morning.

## 2022-11-13 NOTE — Discharge Instructions (Signed)
-  The chest x-ray performed today only shows COPD.  No evidence of pneumonia, pneumothorax, masses, fluid in lung, etc. - Your exam is also reassuring.  You do not have a fever.  You have a normal heart rate and rhythm.  Oxygen is 100%.  Lungs are clear. - At this time, I am not suspecting any serious cause to the hemoptysis/blood in sputum.  It may just be due to irritated airway, small ruptured blood vessel.  Try not to forcefully cough.  Stay hydrated. - If at any point you develop a fever, have discolored sputum, have pain in your chest, shortness of breath, palpitations, dizziness, weakness, fatigue, or have large amounts of gross red blood, go to ER. - Otherwise, if this is not clearing up in a few days, reach out to your PCP or pulmonologist and let them know.  They may consider performing a CT scan of your chest.

## 2022-11-13 NOTE — ED Provider Notes (Signed)
[facility]    CSN: [id number] Arrival date & time: 11/13/22  1245      History   Chief Complaint Chief Complaint  Patient presents with   Hemoptysis    Pt states he is coughing up blood that started this morning    HPI Tony Mitchell is a 82 y.o. male with history of COPD, CAD, diabetes, hypertension, hyperlipidemia, sleep apnea, GERD and gout.  Patient presents today for concerns about hemoptysis.  States that he was clearing his throat this morning and coughed up blood in his sputum.  He says it was initially very bright red.  Now he says when he coughs he notices streaks of blood in the sputum.  Patient says he does not feel poorly.  He denies fever, fatigue, chills, body aches, sore throat, postnasal drainage, nasal congestion, chest pain, palpitations, shortness of breath.  Denies pleuritic pain or back pain.  Denies any sick contacts or known exposure to flu or COVID.  Overall he says he feels well and just became concerned about the streaks of blood in his sputum this morning.  He has not had any increased mucus production than normal.  Former smoker. Quit smoking in 1995. No other complaints.  HPI  Past Medical History:  Diagnosis Date   Acute appendicitis 03/03/2018   Arthritis    Cancer ([place])    skin cancer   Chronic kidney disease, stage 3b ([place])    Collapsed lung 2019   x2   COPD (chronic obstructive pulmonary disease) (HCC)    Coronary artery disease    Diabetes mellitus without complication (HCC)    diet controlled-lost 60 lbs and is not on any current meds   GERD (gastroesophageal reflux disease)    Gout    Hyperlipidemia    Hypertension    Sleep apnea    uses cpap   Thoracic aortic aneurysm ([place])    Vitamin B 12 deficiency     Patient Active Problem List   Diagnosis Date Noted   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    Type 2 diabetes mellitus with hyperlipidemia (HCC)    Hyperkalemia    Diarrhea    Anemia of chronic  disease    AKI (acute kidney injury) ([place]) 11/03/2020   Status post total knee replacement using cement, left 09/16/2018   History of pneumothorax 11/07/2017   Pneumothorax 09/18/2017   Thoracic aortic aneurysm without rupture ([place]) 08/16/2017   Primary osteoarthritis of right knee 07/10/2017   Spinal stenosis of lumbar region with radiculopathy 07/10/2017   GERD (gastroesophageal reflux disease) 06/13/2016   Gout 06/13/2016   Hypertriglyceridemia 06/13/2016   Essential hypertension 06/13/2016   Pneumothorax on right 06/03/2016   Elevated PSA 03/29/2016   Elevated blood sugar 09/29/2015   Rash 02/17/2015   OSA (obstructive sleep apnea) 07/08/2014   S/P cardiac catheterization 05/28/2014   SOB (shortness of breath) on exertion 05/28/2014    Past Surgical History:  Procedure Laterality Date   CARDIAC CATHETERIZATION     CATARACT EXTRACTION  2012   CHEST TUBE INSERTION     x2   COLONOSCOPY WITH PROPOFOL N/A 04/14/2021   Procedure: COLONOSCOPY WITH PROPOFOL;  Surgeon: Lesly Rubenstein, MD;  Location: [facility];  Service: Endoscopy;  Laterality: N/A;   ESOPHAGOGASTRODUODENOSCOPY (EGD) WITH PROPOFOL N/A 04/14/2021   Procedure: ESOPHAGOGASTRODUODENOSCOPY (EGD) WITH PROPOFOL;  Surgeon: Lesly Rubenstein, MD;  Location: [facility];  Service: Endoscopy;  Laterality: N/A;   INSERTION OF MESH  09/22/2021  Procedure: INSERTION OF MESH;  Surgeon: Benjamine Sprague, DO;  Location: [facility];  Service: General;;   JOINT REPLACEMENT     KNEE [place] N/A 03/03/2018   Procedure: APPENDECTOMY LAPAROSCOPIC;  Surgeon: Vickie Epley, MD;  Location: [facility] OR[facility];  Service: General;  Laterality: N/A;   TOTAL KNEE ARTHROPLASTY Left 09/16/2018   Procedure: TOTAL KNEE ARTHROPLASTY;  Surgeon: Corky Mull, MD;  Location: [facility];  Service: Orthopedics;  Laterality: Left;       Home Medications    Prior to Admission medications   Medication Sig Start  Date End Date Taking? Authorizing Provider  allopurinol (ZYLOPRIM) 100 MG tablet Take 100 mg by mouth in the morning and at bedtime. 06/17/17   [provider]  ALPRAZolam Duanne Moron) 0.5 MG tablet Take 0.5 mg by mouth at bedtime. 09/21/20   [provider]  amLODipine (NORVASC) 2.5 MG tablet Take 2.5 mg by mouth in the morning. 04/08/20   [provider]  aspirin EC 81 MG tablet Take 81 mg by mouth in the morning.    [provider]  colchicine 0.6 MG tablet Take 2 tablets (1.2mg ) by mouth at first sign of gout flare followed by 1 tablet (0.6mg ) after 1 hour. (Max 1.8mg  within 1 hour) 12/05/21   [provider]  fenofibrate (TRICOR) 145 MG tablet Take 145 mg by mouth in the morning. 02/25/20   [provider]  finasteride (PROSCAR) 5 MG tablet Take 1 tablet (5 mg total) by mouth daily. 07/31/22   Billey Co, MD  hydrALAZINE (APRESOLINE) 25 MG tablet Take 25 mg by mouth 2 (two) times daily.    [provider]  lansoprazole (PREVACID) 15 MG capsule Take 15 mg by mouth every evening.    [provider]  simvastatin (ZOCOR) 20 MG tablet Take 1 tablet by mouth at bedtime. 12/05/21   [provider]  tamsulosin (FLOMAX) 0.4 MG CAPS capsule TAKE 1 CAPSULE(0.4 MG) BY MOUTH DAILY 10/15/22   Billey Co, MD  vitamin B-12 (CYANOCOBALAMIN) 1000 MCG tablet Take 1,000 mcg by mouth every morning.    [provider]    Family History Family History  Problem Relation Age of Onset   Cancer Father        Unknown   Prostate cancer Neg Hx    Lung cancer Neg Hx     Social History Social History   Tobacco Use   Smoking status: Former    Packs/day: 1.00    Years: 40.00    Additional pack years: 0.00    Total pack years: 40.00    Types: Cigarettes    Quit date: 06/15/1996    Years since quitting: 26.4   Smokeless tobacco: Never  Vaping Use   Vaping Use: Never used  Substance Use Topics   Alcohol use: Not  Currently    Alcohol/week: 21.0 standard drinks of alcohol    Types: 21 Shots of liquor per week    Comment: reports last drink 11/03/20   Drug use: No     Allergies   Patient has no known allergies.   Review of Systems Review of Systems  Constitutional:  Negative for fatigue and fever.  HENT:  Negative for congestion, rhinorrhea, sinus pressure, sinus pain and sore throat.   Respiratory:  Positive for cough. Negative for shortness of breath and wheezing.   Cardiovascular:  Negative for chest pain, palpitations and leg swelling.  Gastrointestinal:  Negative for abdominal pain,  nausea and vomiting.  Musculoskeletal:  Negative for back pain and myalgias.  Neurological:  Negative for dizziness, syncope, weakness, light-headedness and headaches.  Hematological:  Negative for adenopathy.     Physical Exam Triage Vital Signs ED Triage Vitals  Enc Vitals Group     BP      Pulse      Resp      Temp      Temp src      SpO2      Weight      Height      Head Circumference      Peak Flow      Pain Score      Pain Loc      Pain Edu?      Excl. in [place]?    No data found.  Updated Vital Signs BP (!) 161/79   Pulse 69   Temp 98.3 F (36.8 C)   Resp 20   SpO2 100%     Physical Exam Vitals and nursing note reviewed.  Constitutional:      General: He is not in acute distress.    Appearance: Normal appearance. He is well-developed. He is not ill-appearing.  HENT:     Head: Normocephalic and atraumatic.     Nose: Nose normal.     Mouth/Throat:     Mouth: Mucous membranes are moist.     Pharynx: Oropharynx is clear.  Eyes:     General: No scleral icterus.    Conjunctiva/sclera: Conjunctivae normal.  Cardiovascular:     Rate and Rhythm: Normal rate and regular rhythm.     Heart sounds: Normal heart sounds.  Pulmonary:     Effort: Pulmonary effort is normal. No respiratory distress.     Breath sounds: Normal breath sounds. No wheezing, rhonchi or rales.   Musculoskeletal:     Cervical back: Neck supple.  Skin:    General: Skin is warm and dry.     Capillary Refill: Capillary refill takes less than 2 seconds.  Neurological:     General: No focal deficit present.     Mental Status: He is alert. Mental status is at baseline.     Motor: No weakness.     Gait: Gait normal.  Psychiatric:        Mood and Affect: Mood normal.        Behavior: Behavior normal.      UC Treatments / Results  Labs (all labs ordered are listed, but only abnormal results are displayed) Labs Reviewed - No data to display  EKG   Radiology DG Chest 2 View  Result Date: 11/13/2022 CLINICAL DATA:  Hemoptysis EXAM: CHEST - 2 VIEW COMPARISON:  Chest radiograph dated 03/18/2018 FINDINGS: Hyperinflated lungs with flattening of the diaphragm. No focal consolidations. No pleural effusion or pneumothorax. The heart size and mediastinal contours are within normal limits. The visualized skeletal structures are unremarkable. IMPRESSION: 1. No active cardiopulmonary disease. 2. COPD. Electronically Signed   By: Darrin Nipper M.D.   On: 11/13/2022 13:39    Procedures Procedures (including critical care time)  Medications Ordered in UC Medications - No data to display  Initial Impression / Assessment and Plan / UC Course  I have reviewed the triage vital signs and the nursing notes.  Pertinent labs & imaging results that were available during my care of the patient were reviewed by me and considered in my medical decision making (see chart for details).   82 year old male with history of  COPD, diabetes, CAD, hypertension, hyperlipidemia, obstructive sleep apnea presents for noticing blood-streaked sputum this morning when he coughed.  He has not had a cough.  He denies fever, chest pain, shortness of breath.  Blood pressure elevated 161/79.  Other vitals normal and stable.  He is in no acute distress and looks well appearing overall.  Normal HEENT exam.  Chest clear to  auscultation heart regular rate and rhythm.  Chest x-ray obtained. COPD, but no acute abnormality seen.   Discussed results with patient.  At this time, do not suspect significant condition causing his hemoptysis but advised him very close monitoring and if it persists to follow-up with pulmonologist or PCP to see if he needs to have a CT of his chest.  Otherwise I discussed all red flag signs and symptoms which would mean he should go to the emergency department for further and more immediate evaluation to rule out conditions like pulmonary embolism or other acute cardiopulmonary problem.  At this time advised him to avoid forcefully coughing and stay hydrated.   Final Clinical Impressions(s) / UC Diagnoses   Final diagnoses:  Hemoptysis  Chronic obstructive pulmonary disease, unspecified COPD type [facility])     Discharge Instructions      -The chest x-ray performed today only shows COPD.  No evidence of pneumonia, pneumothorax, masses, fluid in lung, etc. - Your exam is also reassuring.  You do not have a fever.  You have a normal heart rate and rhythm.  Oxygen is 100%.  Lungs are clear. - At this time, I am not suspecting any serious cause to the hemoptysis/blood in sputum.  It may just be due to irritated airway, small ruptured blood vessel.  Try not to forcefully cough.  Stay hydrated. - If at any point you develop a fever, have discolored sputum, have pain in your chest, shortness of breath, palpitations, dizziness, weakness, fatigue, or have large amounts of gross red blood, go to ER. - Otherwise, if this is not clearing up in a few days, reach out to your PCP or pulmonologist and let them know.  They may consider performing a CT scan of your chest.     ED Prescriptions   None    PDMP not reviewed this encounter.   Danton Clap, PA-C 11/13/22 1400

## 2023-01-11 ENCOUNTER — Other Ambulatory Visit: Payer: Self-pay | Admitting: Urology

## 2023-01-11 DIAGNOSIS — R351 Nocturia: Secondary | ICD-10-CM

## 2023-01-11 DIAGNOSIS — Z125 Encounter for screening for malignant neoplasm of prostate: Secondary | ICD-10-CM

## 2023-01-11 DIAGNOSIS — N401 Enlarged prostate with lower urinary tract symptoms: Secondary | ICD-10-CM

## 2023-08-06 ENCOUNTER — Other Ambulatory Visit: Payer: Self-pay

## 2023-08-06 ENCOUNTER — Ambulatory Visit: Payer: Medicare Other | Admitting: Urology

## 2023-08-06 DIAGNOSIS — N138 Other obstructive and reflux uropathy: Secondary | ICD-10-CM

## 2023-08-06 DIAGNOSIS — R351 Nocturia: Secondary | ICD-10-CM

## 2023-08-06 DIAGNOSIS — Z125 Encounter for screening for malignant neoplasm of prostate: Secondary | ICD-10-CM

## 2023-08-06 MED ORDER — TAMSULOSIN HCL 0.4 MG PO CAPS
0.4000 mg | ORAL_CAPSULE | Freq: Every day | ORAL | 11 refills | Status: DC
Start: 2023-08-06 — End: 2023-09-18

## 2023-08-28 ENCOUNTER — Ambulatory Visit: Payer: Medicare Other | Admitting: Urology

## 2023-09-18 ENCOUNTER — Ambulatory Visit: Payer: Medicare Other | Admitting: Urology

## 2023-09-18 ENCOUNTER — Encounter: Payer: Self-pay | Admitting: Urology

## 2023-09-18 VITALS — BP 149/76 | HR 65 | Ht 69.0 in | Wt 152.0 lb

## 2023-09-18 DIAGNOSIS — N401 Enlarged prostate with lower urinary tract symptoms: Secondary | ICD-10-CM | POA: Diagnosis not present

## 2023-09-18 DIAGNOSIS — R351 Nocturia: Secondary | ICD-10-CM | POA: Diagnosis not present

## 2023-09-18 DIAGNOSIS — N138 Other obstructive and reflux uropathy: Secondary | ICD-10-CM

## 2023-09-18 DIAGNOSIS — Z125 Encounter for screening for malignant neoplasm of prostate: Secondary | ICD-10-CM

## 2023-09-18 LAB — BLADDER SCAN AMB NON-IMAGING

## 2023-09-18 MED ORDER — FINASTERIDE 5 MG PO TABS: 5.0000 mg | ORAL_TABLET | Freq: Every day | ORAL | 3 refills | Status: DC

## 2023-09-18 MED ORDER — TAMSULOSIN HCL 0.4 MG PO CAPS: 0.4000 mg | ORAL_CAPSULE | Freq: Every day | ORAL | 11 refills | Status: AC

## 2023-09-18 NOTE — Progress Notes (Signed)
   09/18/2023 10:20 AM   Tony Mitchell 07-14-1941 [id number]  Reason for visit: Follow up BPH/LUTS, nocturia, PSA screening  HPI: 83 year old healthy male who we have followed for the above issues.  He has a long history of mildly elevated PSA that has ranged from 6-9 with very reassuring >30% suggesting BPH as cause of his mild PSA elevation.  He also has a history of a negative prostate biopsy.  PSA decreased to 4.8 in April 2023, and most recently down to 2.4 in January 2024 after starting finasteride.  We reviewed the AUA guidelines that do not recommend routine screening in men over age 78, and reassurance provided regarding PSA trend.  He has been on Flomax and finasteride for his urinary symptoms with excellent results.  Originally was having bothersome nocturia 3-6 times per night that has now resolved to 0-1 time overnight.  He is compliant with CPAP machine.  PVRs have been normal, including normal at 59ml today.  He really denies any bothersome urinary complaints today.  Prior CT in July 2020 showed a 60 g prostate with no hydronephrosis or other urologic abnormalities.  Not interested in outlet procedures at this time and would like to continue maximal medical therapy.  Flomax and finasteride refilled  No further PSA screening needed per guideline recommendations RTC 1 year PVR Symptoms have been stable over the last few years, if PCP comfortable filling Flomax and finasteride can follow-up with urology as needed in the future  Sondra Come, MD  [facility] [address] [place], [place] [address] [phone]

## 2023-10-07 ENCOUNTER — Ambulatory Visit
Admission: EM | Admit: 2023-10-07 | Discharge: 2023-10-07 | Disposition: A | Discharge status: Home / Self Care | Payer: Medicare Other | Attending: Family Medicine | Admitting: Family Medicine

## 2023-10-07 DIAGNOSIS — R21 Rash and other nonspecific skin eruption: Secondary | ICD-10-CM

## 2023-10-07 MED ORDER — TRIAMCINOLONE ACETONIDE 0.5 % EX OINT: 1.0000 | TOPICAL_OINTMENT | Freq: Two times a day (BID) | CUTANEOUS | 0 refills | Status: DC

## 2023-10-07 MED ORDER — DOXYCYCLINE HYCLATE 100 MG PO CAPS: 100.0000 mg | ORAL_CAPSULE | Freq: Two times a day (BID) | ORAL | 0 refills | Status: DC

## 2023-10-07 NOTE — ED Triage Notes (Addendum)
Rash on right hand for 5 days Hurts if you push on his fingers.   Patient noticed Rash came on after he had biopsy on right arm

## 2023-10-07 NOTE — ED Provider Notes (Signed)
 [facility]    CSN: [id number] Arrival date & time: 10/07/23  1131      History   Chief Complaint Chief Complaint  Patient presents with   Rash    HPI Tony Mitchell is a 83 y.o. male.   HPI  Tony Mitchell presents for red spots on his hand that started about a week ago.  This morning, he started having pain in his thumb, index and middle finger tips.  His hand feesl cooler and swollen. Has pain that radiates down his arm to his hand. He had a biopsy on his elbow on 09/27/23.       Past Medical History:  Diagnosis Date   Acute appendicitis 03/03/2018   Arthritis    Cancer (H[facility])    skin cancer   Chronic kidney disease, stage 3b (H[facility])    Collapsed lung 2019   x2   COPD (chronic obstructive pulmonary disease) (HCC)    Coronary artery disease    Diabetes mellitus without complication (HCC)    diet controlled-lost 60 lbs and is not on any current meds   GERD (gastroesophageal reflux disease)    Gout    Hyperlipidemia    Hypertension    Sleep apnea    uses cpap   Thoracic aortic aneurysm (H[facility])    Vitamin B 12 deficiency     Patient Active Problem List   Diagnosis Date Noted   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    Type 2 diabetes mellitus with hyperlipidemia (HCC)    Hyperkalemia    Diarrhea    Anemia of chronic disease    AKI (acute kidney injury) ([facility]) 11/03/2020   Status post total knee replacement using cement, left 09/16/2018   History of pneumothorax 11/07/2017   Pneumothorax 09/18/2017   Thoracic aortic aneurysm without rupture (HCC) 08/16/2017   Primary osteoarthritis of right knee 07/10/2017   Spinal stenosis of lumbar region with radiculopathy 07/10/2017   GERD (gastroesophageal reflux disease) 06/13/2016   Gout 06/13/2016   Hypertriglyceridemia 06/13/2016   Essential hypertension 06/13/2016   Pneumothorax on right 06/03/2016   Elevated PSA 03/29/2016   Elevated blood sugar 09/29/2015   Rash 02/17/2015   OSA  (obstructive sleep apnea) 07/08/2014   S/P cardiac catheterization 05/28/2014   SOB (shortness of breath) on exertion 05/28/2014    Past Surgical History:  Procedure Laterality Date   CARDIAC CATHETERIZATION     CATARACT EXTRACTION  2012   CHEST TUBE INSERTION     x2   COLONOSCOPY WITH PROPOFOL N/A 04/14/2021   Procedure: COLONOSCOPY WITH PROPOFOL;  Surgeon: Regis Bill, MD;  Location: [facility];  Service: Endoscopy;  Laterality: N/A;   ESOPHAGOGASTRODUODENOSCOPY (EGD) WITH PROPOFOL N/A 04/14/2021   Procedure: ESOPHAGOGASTRODUODENOSCOPY (EGD) WITH PROPOFOL;  Surgeon: Regis Bill, MD;  Location: [facility];  Service: Endoscopy;  Laterality: N/A;   INSERTION OF MESH  09/22/2021   Procedure: INSERTION OF MESH;  Surgeon: Sung Amabile, DO;  Location: [facility];  Service: General;;   JOINT REPLACEMENT     KNEE SURGERY Left 1965   LAPAROSCOPIC APPENDECTOMY N/A 03/03/2018   Procedure: APPENDECTOMY LAPAROSCOPIC;  Surgeon: Ancil Linsey, MD;  Location: [facility];  Service: General;  Laterality: N/A;   TOTAL KNEE ARTHROPLASTY Left 09/16/2018   Procedure: TOTAL KNEE ARTHROPLASTY;  Surgeon: Christena Flake, MD;  Location: [facility];  Service: Orthopedics;  Laterality: Left;       Home Medications    Prior to Admission medications   Medication  Sig Start Date End Date Taking? Authorizing Provider  allopurinol (ZYLOPRIM) 100 MG tablet Take 100 mg by mouth in the morning and at bedtime. 06/17/17  Yes [provider]  ALPRAZolam Prudy Feeler) 0.5 MG tablet Take 0.5 mg by mouth at bedtime. 09/21/20  Yes [provider]  amLODipine (NORVASC) 2.5 MG tablet Take 2.5 mg by mouth in the morning. 04/08/20  Yes [provider]  aspirin EC 81 MG tablet Take 81 mg by mouth in the morning.   Yes [provider]  colchicine 0.6 MG tablet Take 2 tablets (1.2mg ) by mouth at first sign of gout flare followed by 1 tablet (0.6mg ) after 1 hour. (Max 1.8mg  within 1  hour) 12/05/21  Yes [provider]  doxycycline (VIBRAMYCIN) 100 MG capsule Take 1 capsule (100 mg total) by mouth 2 (two) times daily. 10/07/23  Yes Maylon Sailors, DO  fenofibrate (TRICOR) 145 MG tablet Take 145 mg by mouth in the morning. 02/25/20  Yes [provider]  hydrALAZINE (APRESOLINE) 25 MG tablet Take 25 mg by mouth 2 (two) times daily.   Yes [provider]  lansoprazole (PREVACID) 15 MG capsule Take 15 mg by mouth every evening.   Yes [provider]  simvastatin (ZOCOR) 20 MG tablet Take 1 tablet by mouth at bedtime. 12/05/21  Yes [provider]  tamsulosin (FLOMAX) 0.4 MG CAPS capsule Take 1 capsule (0.4 mg total) by mouth daily. 09/18/23  Yes Sondra Come, MD  triamcinolone ointment (KENALOG) 0.5 % Apply 1 Application topically 2 (two) times daily. 10/07/23  Yes Tylie Golonka, DO  vitamin B-12 (CYANOCOBALAMIN) 1000 MCG tablet Take 1,000 mcg by mouth every morning.   Yes [provider]  finasteride (PROSCAR) 5 MG tablet Take 1 tablet (5 mg total) by mouth daily. 10/08/23   Sondra Come, MD    Family History Family History  Problem Relation Age of Onset   Cancer Father        Unknown   Prostate cancer Neg Hx    Lung cancer Neg Hx     Social History Social History   Tobacco Use   Smoking status: Former    Current packs/day: 0.00    Average packs/day: 1 pack/day for 40.0 years (40.0 ttl pk-yrs)    Types: Cigarettes    Start date: 06/15/1956    Quit date: 06/15/1996    Years since quitting: 27.3    Passive exposure: Past   Smokeless tobacco: Never  Vaping Use   Vaping status: Never Used  Substance Use Topics   Alcohol use: Not Currently    Alcohol/week: 21.0 standard drinks of alcohol    Types: 21 Shots of liquor per week    Comment: reports last drink 11/03/20   Drug use: No     Allergies   Patient has no known allergies.   Review of Systems Review of Systems :negative unless otherwise stated in  HPI.      Physical Exam Triage Vital Signs ED Triage Vitals  Encounter Vitals Group     BP --      Systolic BP Percentile --      Diastolic BP Percentile --      Pulse Rate 10/07/23 1342 61     Resp 10/07/23 1342 16     Temp 10/07/23 1342 (!) 97.5 F (36.4 C)     Temp Source 10/07/23 1342 Oral     SpO2 --      Weight --  Height --      Head Circumference --      Peak Flow --      Pain Score 10/07/23 1345 0     Pain Loc --      Pain Education --      Exclude from Growth Chart --    No data found.  Updated Vital Signs BP (!) 150/86 (BP Location: Left Arm)   Pulse 66   Temp (!) 97.5 F (36.4 C) (Oral)   Resp 16   SpO2 98%   Visual Acuity Right Eye Distance:   Left Eye Distance:   Bilateral Distance:    Right Eye Near:   Left Eye Near:    Bilateral Near:     Physical Exam  GEN: alert, well appearing male, in no acute distress  CV: regular rate, strong radial pulse RESP: no increased work of breathing MSK: no extremity edema,  right hand: Inspection: No obvious deformity b/l. No swelling, erythema or bruising b/l Palpation: no TTP b/l ROM: Full ROM of the digits and wrist b/l. No swelling in PIP, DIP joints b/l. Flexor digitorum profundus and superficialis tendon functions are intact.  PIP joint collateral ligaments are stable  Strength: 5/5 strength in the forearm, wrist and interosseus muscles b/l Neurovascular: NV intact b/l SKIN: warm and dry; erythematous patches and papules on right hand,  UC Treatments / Results  Labs (all labs ordered are listed, but only abnormal results are displayed) Labs Reviewed - No data to display  EKG   Radiology No results found.  Procedures Procedures (including critical care time)  Medications Ordered in UC Medications - No data to display  Initial Impression / Assessment and Plan / UC Course  I have reviewed the triage vital signs and the nursing notes.  Pertinent labs & imaging results that were  available during my care of the patient were reviewed by me and considered in my medical decision making (see chart for details).     Patient is a 83 y.o. malewho presents for right hand rash for 5 days.  Overall, patient is well-appearing and well-hydrated.  Vital signs stable.  Jocelyn is afebrile.  Exam concerning for cellulitis versus contact dermatitis.  Treat with doxycycline as below and steroid ointment.   Reviewed expectations regarding course of current medical issues.  All questions asked were answered.  Outlined signs and symptoms indicating need for more acute intervention. Patient verbalized understanding. After Visit Summary given.   Final Clinical Impressions(s) / UC Diagnoses   Final diagnoses:  Rash     Discharge Instructions      Stop by the pharmacy to pick up your prescriptions.  Follow up with your primary care provider, if not improving,.      ED Prescriptions     Medication Sig Dispense Auth. Provider   doxycycline (VIBRAMYCIN) 100 MG capsule Take 1 capsule (100 mg total) by mouth 2 (two) times daily. 14 capsule Kimm Ungaro, DO   triamcinolone ointment (KENALOG) 0.5 % Apply 1 Application topically 2 (two) times daily. 30 g Katha Cabal, DO      PDMP not reviewed this encounter.              Katha Cabal, DO 10/14/23 1315

## 2023-10-07 NOTE — Discharge Instructions (Signed)
Stop by the pharmacy to pick up your prescriptions.  Follow up with your primary care provider, if not improving,.

## 2023-10-08 ENCOUNTER — Other Ambulatory Visit: Payer: Self-pay

## 2023-10-08 DIAGNOSIS — R351 Nocturia: Secondary | ICD-10-CM

## 2023-10-08 DIAGNOSIS — Z125 Encounter for screening for malignant neoplasm of prostate: Secondary | ICD-10-CM

## 2023-10-08 DIAGNOSIS — N138 Other obstructive and reflux uropathy: Secondary | ICD-10-CM

## 2023-10-08 MED ORDER — FINASTERIDE 5 MG PO TABS: 5.0000 mg | ORAL_TABLET | Freq: Every day | ORAL | 3 refills | Status: AC

## 2023-11-22 ENCOUNTER — Other Ambulatory Visit: Payer: Self-pay | Admitting: Physician Assistant

## 2023-11-22 ENCOUNTER — Ambulatory Visit
Admission: RE | Admit: 2023-11-22 | Discharge: 2023-11-22 | Disposition: A | Discharge status: Home / Self Care | Source: Ambulatory Visit | Attending: Physician Assistant | Admitting: Physician Assistant

## 2023-11-22 DIAGNOSIS — R1013 Epigastric pain: Secondary | ICD-10-CM | POA: Insufficient documentation

## 2023-11-22 DIAGNOSIS — R7989 Other specified abnormal findings of blood chemistry: Secondary | ICD-10-CM | POA: Insufficient documentation

## 2023-11-22 MED ORDER — IOHEXOL 300 MG/ML  SOLN
100.0000 mL | Freq: Once | INTRAMUSCULAR | Status: AC | PRN
  Administered 2023-11-22: 100 mL via INTRAVENOUS

## 2023-11-27 ENCOUNTER — Other Ambulatory Visit: Payer: Self-pay

## 2023-11-27 ENCOUNTER — Telehealth: Payer: Self-pay

## 2023-11-27 ENCOUNTER — Inpatient Hospital Stay

## 2023-11-27 ENCOUNTER — Encounter: Payer: Self-pay | Admitting: Oncology

## 2023-11-27 ENCOUNTER — Inpatient Hospital Stay: Attending: Oncology | Admitting: Oncology

## 2023-11-27 VITALS — BP 165/76 | HR 69 | Temp 97.6°F | Resp 18 | Wt 144.7 lb

## 2023-11-27 DIAGNOSIS — C3492 Malignant neoplasm of unspecified part of left bronchus or lung: Secondary | ICD-10-CM | POA: Insufficient documentation

## 2023-11-27 DIAGNOSIS — K219 Gastro-esophageal reflux disease without esophagitis: Secondary | ICD-10-CM | POA: Insufficient documentation

## 2023-11-27 DIAGNOSIS — M109 Gout, unspecified: Secondary | ICD-10-CM | POA: Diagnosis not present

## 2023-11-27 DIAGNOSIS — R748 Abnormal levels of other serum enzymes: Secondary | ICD-10-CM

## 2023-11-27 DIAGNOSIS — R160 Hepatomegaly, not elsewhere classified: Secondary | ICD-10-CM

## 2023-11-27 DIAGNOSIS — R7402 Elevation of levels of lactic acid dehydrogenase (LDH): Secondary | ICD-10-CM | POA: Insufficient documentation

## 2023-11-27 DIAGNOSIS — E785 Hyperlipidemia, unspecified: Secondary | ICD-10-CM | POA: Insufficient documentation

## 2023-11-27 DIAGNOSIS — I712 Thoracic aortic aneurysm, without rupture, unspecified: Secondary | ICD-10-CM | POA: Insufficient documentation

## 2023-11-27 DIAGNOSIS — C7931 Secondary malignant neoplasm of brain: Secondary | ICD-10-CM | POA: Insufficient documentation

## 2023-11-27 DIAGNOSIS — D371 Neoplasm of uncertain behavior of stomach: Secondary | ICD-10-CM | POA: Insufficient documentation

## 2023-11-27 DIAGNOSIS — N1832 Chronic kidney disease, stage 3b: Secondary | ICD-10-CM | POA: Diagnosis not present

## 2023-11-27 DIAGNOSIS — Z79899 Other long term (current) drug therapy: Secondary | ICD-10-CM | POA: Diagnosis not present

## 2023-11-27 DIAGNOSIS — E119 Type 2 diabetes mellitus without complications: Secondary | ICD-10-CM | POA: Insufficient documentation

## 2023-11-27 DIAGNOSIS — G4730 Sleep apnea, unspecified: Secondary | ICD-10-CM | POA: Insufficient documentation

## 2023-11-27 DIAGNOSIS — Z87891 Personal history of nicotine dependence: Secondary | ICD-10-CM | POA: Insufficient documentation

## 2023-11-27 DIAGNOSIS — R9389 Abnormal findings on diagnostic imaging of other specified body structures: Secondary | ICD-10-CM | POA: Diagnosis not present

## 2023-11-27 DIAGNOSIS — M129 Arthropathy, unspecified: Secondary | ICD-10-CM | POA: Diagnosis not present

## 2023-11-27 DIAGNOSIS — E1122 Type 2 diabetes mellitus with diabetic chronic kidney disease: Secondary | ICD-10-CM | POA: Insufficient documentation

## 2023-11-27 DIAGNOSIS — E538 Deficiency of other specified B group vitamins: Secondary | ICD-10-CM | POA: Insufficient documentation

## 2023-11-27 DIAGNOSIS — K3189 Other diseases of stomach and duodenum: Secondary | ICD-10-CM | POA: Diagnosis not present

## 2023-11-27 DIAGNOSIS — M8588 Other specified disorders of bone density and structure, other site: Secondary | ICD-10-CM | POA: Insufficient documentation

## 2023-11-27 DIAGNOSIS — Z7722 Contact with and (suspected) exposure to environmental tobacco smoke (acute) (chronic): Secondary | ICD-10-CM | POA: Insufficient documentation

## 2023-11-27 DIAGNOSIS — I2510 Atherosclerotic heart disease of native coronary artery without angina pectoris: Secondary | ICD-10-CM | POA: Diagnosis not present

## 2023-11-27 DIAGNOSIS — R599 Enlarged lymph nodes, unspecified: Secondary | ICD-10-CM | POA: Insufficient documentation

## 2023-11-27 DIAGNOSIS — I129 Hypertensive chronic kidney disease with stage 1 through stage 4 chronic kidney disease, or unspecified chronic kidney disease: Secondary | ICD-10-CM | POA: Insufficient documentation

## 2023-11-27 DIAGNOSIS — Z85828 Personal history of other malignant neoplasm of skin: Secondary | ICD-10-CM | POA: Insufficient documentation

## 2023-11-27 DIAGNOSIS — R140 Abdominal distension (gaseous): Secondary | ICD-10-CM | POA: Diagnosis not present

## 2023-11-27 DIAGNOSIS — Z7952 Long term (current) use of systemic steroids: Secondary | ICD-10-CM | POA: Insufficient documentation

## 2023-11-27 DIAGNOSIS — C7951 Secondary malignant neoplasm of bone: Secondary | ICD-10-CM | POA: Insufficient documentation

## 2023-11-27 DIAGNOSIS — J449 Chronic obstructive pulmonary disease, unspecified: Secondary | ICD-10-CM | POA: Diagnosis not present

## 2023-11-27 DIAGNOSIS — K769 Liver disease, unspecified: Secondary | ICD-10-CM | POA: Insufficient documentation

## 2023-11-27 LAB — COMPREHENSIVE METABOLIC PANEL WITH GFR
ALT: 31 U/L (ref 0–44)
AST: 79 U/L — ABNORMAL HIGH (ref 15–41)
Albumin: 4.3 g/dL (ref 3.5–5.0)
Alkaline Phosphatase: 150 U/L — ABNORMAL HIGH (ref 38–126)
Anion gap: 8 (ref 5–15)
BUN: 16 mg/dL (ref 8–23)
CO2: 29 mmol/L (ref 22–32)
Calcium: 9.2 mg/dL (ref 8.9–10.3)
Chloride: 99 mmol/L (ref 98–111)
Creatinine, Ser: 1.03 mg/dL (ref 0.61–1.24)
GFR, Estimated: >60 mL/min (ref >60)
Glucose, Bld: 102 mg/dL — ABNORMAL HIGH (ref 70–99)
Potassium: 4 mmol/L (ref 3.5–5.1)
Sodium: 136 mmol/L (ref 135–145)
Total Bilirubin: 0.8 mg/dL (ref 0.0–1.2)
Total Protein: 7 g/dL (ref 6.5–8.1)

## 2023-11-27 LAB — HEPATITIS PANEL, ACUTE
HCV Ab: NONREACTIVE
Hep A IgM: NONREACTIVE
Hep B C IgM: NONREACTIVE
Hepatitis B Surface Ag: NONREACTIVE

## 2023-11-27 LAB — LACTATE DEHYDROGENASE: LDH: 1262 U/L — ABNORMAL HIGH (ref 98–192)

## 2023-11-27 LAB — PROTIME-INR
INR: 1 (ref 0.8–1.2)
Prothrombin Time: 13.1 s (ref 11.4–15.2)

## 2023-11-27 NOTE — Assessment & Plan Note (Signed)
 Abdominal Pain with Liver Lesions Intermittent abdominal pain with liver lesions on CT scan suggests possible malignancy. Differential includes neuroendocrine tumors, GI tract cancer, pancreatic biliary cancer, or lymphoma.   Recommend to obtain PET scan Obtain ultrasound-guided liver biopsy Check hepatitis panel, CA 19-9, INR, LDH, CMP, AFP, chromogranin A, CA 19-9.  CEA. Instruct him to stop aspirin 81 mg in light of upcoming liver biopsy.  LDH is significantly elevated.  I will check peripheral blood flow cytometry

## 2023-11-27 NOTE — Assessment & Plan Note (Signed)
 Pending above work up

## 2023-11-27 NOTE — Progress Notes (Signed)
 Hematology/Oncology Consult Note [phone] [phone]     REFERRING PROVIDER: Barbette Reichmann, MD    CHIEF COMPLAINTS/PURPOSE OF CONSULTATION:  Liver masses  ASSESSMENT & PLAN:   Liver lesion Abdominal Pain with Liver Lesions Intermittent abdominal pain with liver lesions on CT scan suggests possible malignancy. Differential includes neuroendocrine tumors, GI tract cancer, pancreatic biliary cancer, or lymphoma.   Recommend to obtain PET scan Obtain ultrasound-guided liver biopsy Check hepatitis panel, CA 19-9, INR, LDH, CMP, AFP, chromogranin A, CA 19-9.  CEA. Instruct him to stop aspirin 81 mg in light of upcoming liver biopsy.  LDH is significantly elevated.  I will check peripheral blood flow cytometry  Mass of stomach Pending above workup.  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  Pending above workup.   Orders Placed This Encounter  Procedures   US BIOPSY (LIVER)    Standing Status:   Future    Expiration Date:   11/26/2024    Lab orders requested (DO NOT place separate lab orders, these will be automatically ordered during procedure specimen collection)::   Surgical Pathology    Reason for Exam (SYMPTOM  OR DIAGNOSIS REQUIRED):   liver lesion    Preferred location?:   [facility]   Hepatitis panel, acute    Standing Status:   Future    Number of Occurrences:   1    Expected Date:   11/27/2023    Expiration Date:   11/26/2024   CA 19-9 (SERIAL)    Standing Status:   Future    Number of Occurrences:   1    Expected Date:   11/27/2023    Expiration Date:   11/26/2024   Protime-INR    Standing Status:   Future    Number of Occurrences:   1    Expected Date:   11/27/2023    Expiration Date:   11/26/2024   Lactate dehydrogenase    Standing Status:   Future    Number of Occurrences:   1    Expected Date:   11/27/2023    Expiration Date:   11/26/2024   Comprehensive metabolic panel with GFR    Standing Status:    Future    Number of Occurrences:   1    Expected Date:   11/27/2023    Expiration Date:   11/26/2024   AFP tumor marker    Standing Status:   Future    Number of Occurrences:   1    Expected Date:   11/27/2023    Expiration Date:   11/26/2024   Chromogranin A    Standing Status:   Future    Number of Occurrences:   1    Expected Date:   11/27/2023    Expiration Date:   11/26/2024   Cancer antigen 19-9    Standing Status:   Future    Number of Occurrences:   1    Expected Date:   11/27/2023    Expiration Date:   11/26/2024    All questions were answered. The patient knows to call the clinic with any problems, questions or concerns.  Rickard Patience, MD, PhD [facility] Hematology Oncology 11/27/2023    HISTORY OF PRESENTING ILLNESS:  Tony Mitchell 83 y.o. male presents to establish care for liver masses  He began experiencing abdominal pain for a week.  Initially presenting as increased gas and stomach growling despite having eaten. The pain was localized between the rib cage and the abdomen  and described as intermittent.  Later the pain had resolved, but he continues to feel bloated.   11/22/2023 CT abdomen pelvis with contrast showed 1. Multiple hepatic metastatic lesions. Correlation with history of known malignancy recommended. 2. Serosal implant along the distal stomach versus gastrohepatic adenopathy. 3. Thickened appearance of the gastric antrum and pylorus likely related to underdistention. An infiltrative mass is less likely but not excluded. Endoscopy may provide better evaluation. 4. Herniation of a segment of the sigmoid colon into the left inguinal canal. No bowel obstruction.   Patient denies changes in bowel habits.  Denies unintentional weight loss night sweats or fever. No history of blood transfusions or hepatitis. He is not on any blood thinners but takes aspirin for preventive purposes.  He has a history of stage 3 renal disease diagnosed three years ago after a blood  test revealed elevated potassium levels. Since then, he has made significant lifestyle changes, including stopping alcohol consumption and losing weight. He now maintains a weight between 145 and 151 pounds and walks approximately 5.6 miles daily.  He has a history of skin cancer, treated with resection, and is scheduled for further treatment. His father had prostate cancer.  Patient had a normal PSA in January 2025.   Patient lives at home with wife who has multiple medical problems.  He is the caregiver for her. His daughter passed away last year.  He has granddaughter who lives in [place].   MEDICAL HISTORY:  Past Medical History:  Diagnosis Date   Acute appendicitis 03/03/2018   Arthritis    Cancer (HCC)    skin cancer   Chronic kidney disease, stage 3b (HCC)    Collapsed lung 2019   x2   COPD (chronic obstructive pulmonary disease) (HCC)    Coronary artery disease    Diabetes mellitus without complication (HCC)    diet controlled-lost 60 lbs and is not on any current meds   GERD (gastroesophageal reflux disease)    Gout    Hyperlipidemia    Hypertension    Sleep apnea    uses cpap   Thoracic aortic aneurysm (HCC)    Vitamin B 12 deficiency     SURGICAL HISTORY: Past Surgical History:  Procedure Laterality Date   CARDIAC CATHETERIZATION     CATARACT EXTRACTION  2012   CHEST TUBE INSERTION     x2   COLONOSCOPY WITH PROPOFOL N/A 04/14/2021   Procedure: COLONOSCOPY WITH PROPOFOL;  Surgeon: Regis Bill, MD;  Location: [facility] ENDOSCOPY;  Service: Endoscopy;  Laterality: N/A;   ESOPHAGOGASTRODUODENOSCOPY (EGD) WITH PROPOFOL N/A 04/14/2021   Procedure: ESOPHAGOGASTRODUODENOSCOPY (EGD) WITH PROPOFOL;  Surgeon: Regis Bill, MD;  Location: [facility] ENDOSCOPY;  Service: Endoscopy;  Laterality: N/A;   INSERTION OF MESH  09/22/2021   Procedure: INSERTION OF MESH;  Surgeon: Sung Amabile, DO;  Location: [facility] ORS;  Service: General;;   JOINT REPLACEMENT     KNEE SURGERY Left  1965   LAPAROSCOPIC APPENDECTOMY N/A 03/03/2018   Procedure: APPENDECTOMY LAPAROSCOPIC;  Surgeon: Ancil Linsey, MD;  Location: [facility] ORS;  Service: General;  Laterality: N/A;   TOTAL KNEE ARTHROPLASTY Left 09/16/2018   Procedure: TOTAL KNEE ARTHROPLASTY;  Surgeon: Christena Flake, MD;  Location: [facility] ORS;  Service: Orthopedics;  Laterality: Left;    SOCIAL HISTORY: Social History   Socioeconomic History   Marital status: Married    Spouse name: Not on file   Number of children: Not on file   Years of education: Not on file  Highest education level: Not on file  Occupational History   Not on file  Tobacco Use   Smoking status: Former    Current packs/day: 0.00    Average packs/day: 1 pack/day for 40.0 years (40.0 ttl pk-yrs)    Types: Cigarettes    Start date: 06/15/1956    Quit date: 06/15/1996    Years since quitting: 27.4    Passive exposure: Past   Smokeless tobacco: Never  Vaping Use   Vaping status: Never Used  Substance and Sexual Activity   Alcohol use: Not Currently    Alcohol/week: 21.0 standard drinks of alcohol    Types: 21 Shots of liquor per week    Comment: reports last drink 11/03/20   Drug use: No   Sexual activity: Yes  Other Topics Concern   Not on file  Social History Narrative   Not on file   Social Drivers of Health   Financial Resource Strain: Low Risk  (10/30/2023)   Received from [facility]   Overall Financial Resource Strain (CARDIA)    Difficulty of Paying Living Expenses: Not hard at all  Food Insecurity: No Food Insecurity (10/30/2023)   Received from [facility]   Hunger Vital Sign    Worried About Running Out of Food in the Last Year: Never true    Ran Out of Food in the Last Year: Never true  Transportation Needs: No Transportation Needs (10/30/2023)   Received from [facility] - Transportation    In the past 12 months, has lack of transportation kept you from medical  appointments or from getting medications?: No    Lack of Transportation (Non-Medical): No  Physical Activity: Sufficiently Active (07/10/2017)   Received from [facility], [facility]   Exercise Vital Sign    Days of Exercise per Week: 5 days    Minutes of Exercise per Session: 60 min  Stress: Stress Concern Present (07/10/2017)   Received from [facility], [facility]   [employer] of Occupational [facility] - Occupational Stress Questionnaire    Feeling of Stress : Rather much  Social Connections: Unknown (07/10/2017)   Received from [facility], [facility]   Social Connection and Isolation Panel [NHANES]    Frequency of Communication with Friends and Family: Patient declined    Frequency of Social Gatherings with Friends and Family: Patient declined    Attends Religious Services: Patient declined    Active Member of Clubs or Organizations: Patient declined    Attends Engineer, structural: Patient declined    Marital Status: Patient declined  Catering manager Violence: Not on file    FAMILY HISTORY: Family History  Problem Relation Age of Onset   Cancer Father        Unknown   Prostate cancer Father    Lung cancer Neg Hx     ALLERGIES:  has no known allergies.  MEDICATIONS:  Current Outpatient Medications  Medication Sig Dispense Refill   allopurinol (ZYLOPRIM) 100 MG tablet Take 100 mg by mouth in the morning and at bedtime.     ALPRAZolam (XANAX) 0.5 MG tablet Take 0.5 mg by mouth at bedtime.     amLODipine (NORVASC) 2.5 MG tablet Take 2.5 mg by mouth in the morning.     aspirin EC 81 MG tablet Take 81 mg by mouth in the morning.     colchicine 0.6 MG tablet  Take 2 tablets (1.2mg ) by mouth at first sign of gout flare followed by 1 tablet (0.6mg ) after 1 hour. (Max 1.8mg  within 1 hour)     fenofibrate (TRICOR) 145 MG tablet Take 145 mg by mouth 2 (two)  times a week. On Wednesday and Sunday     finasteride (PROSCAR) 5 MG tablet Take 1 tablet (5 mg total) by mouth daily. 90 tablet 3   hydrALAZINE (APRESOLINE) 25 MG tablet Take 25 mg by mouth 2 (two) times daily.     lansoprazole (PREVACID) 15 MG capsule Take 15 mg by mouth every evening.     simvastatin (ZOCOR) 20 MG tablet Take 1 tablet by mouth 2 (two) times a week. On  Wednesday and Sunday     tamsulosin (FLOMAX) 0.4 MG CAPS capsule Take 1 capsule (0.4 mg total) by mouth daily. 30 capsule 11   triamcinolone ointment (KENALOG) 0.5 % Apply 1 Application topically 2 (two) times daily. 30 g 0   vitamin B-12 (CYANOCOBALAMIN) 1000 MCG tablet Take 1,000 mcg by mouth every morning.     Vitamin D, Ergocalciferol, (DRISDOL) 1.25 MG (50000 UNIT) CAPS capsule Take 50,000 Units by mouth every 7 (seven) days.     No current facility-administered medications for this visit.    Review of Systems  Constitutional:  Negative for appetite change, chills, fatigue, fever and unexpected weight change.  HENT:   Negative for hearing loss and voice change.   Eyes:  Negative for eye problems and icterus.  Respiratory:  Negative for chest tightness, cough and shortness of breath.   Cardiovascular:  Negative for chest pain and leg swelling.  Gastrointestinal:  Negative for abdominal distention and abdominal pain.       Bloating  Endocrine: Negative for hot flashes.  Genitourinary:  Negative for difficulty urinating, dysuria and frequency.   Musculoskeletal:  Negative for arthralgias.  Skin:  Negative for itching and rash.  Neurological:  Negative for light-headedness and numbness.  Hematological:  Negative for adenopathy. Does not bruise/bleed easily.  Psychiatric/Behavioral:  Negative for confusion.      PHYSICAL EXAMINATION: ECOG PERFORMANCE STATUS: 0 - Asymptomatic  Vitals:   11/27/23 1532  BP: (!) 165/76  Pulse: 69  Resp: 18  Temp: 97.6 F (36.4 C)   Filed Weights   11/27/23 1532  Weight: 144  lb 11.2 oz (65.6 kg)    Physical Exam Constitutional:      General: He is not in acute distress.    Appearance: He is not diaphoretic.  HENT:     Head: Normocephalic and atraumatic.     Nose: Nose normal.  Eyes:     General: No scleral icterus. Cardiovascular:     Rate and Rhythm: Normal rate and regular rhythm.     Heart sounds: No murmur heard. Pulmonary:     Effort: Pulmonary effort is normal. No respiratory distress.  Abdominal:     General: There is no distension.     Palpations: Abdomen is soft. There is mass.     Tenderness: There is no abdominal tenderness.  Musculoskeletal:        General: Normal range of motion.     Cervical back: Normal range of motion and neck supple.  Skin:    General: Skin is warm and dry.     Findings: No erythema.  Neurological:     Mental Status: He is alert and oriented to person, place, and time. Mental status is at baseline.     Cranial Nerves: No cranial nerve  deficit.     Motor: No abnormal muscle tone.     Coordination: Coordination normal.  Psychiatric:        Mood and Affect: Mood and affect normal.      LABORATORY DATA:  I have reviewed the data as listed    Latest Ref Rng & Units 11/20/2020    1:35 PM 11/06/2020    4:08 AM 11/05/2020    3:57 AM  CBC  WBC 4.0 - 10.5 K/uL 6.3  6.2  5.5   Hemoglobin 13.0 - 17.0 g/dL 21.3  9.5  8.8   Hematocrit 39.0 - 52.0 % 33.8  27.7  26.1   Platelets 150 - 400 K/uL 356  225  227       Latest Ref Rng & Units 11/27/2023    4:17 PM 03/09/2021    9:44 AM 11/20/2020    1:35 PM  CMP  Glucose 70 - 99 mg/dL 086   578   BUN 8 - 23 mg/dL 16   18   Creatinine 4.69 - 1.24 mg/dL 6.29  5.28  4.13   Sodium 135 - 145 mmol/L 136   131   Potassium 3.5 - 5.1 mmol/L 4.0   4.4   Chloride 98 - 111 mmol/L 99   98   CO2 22 - 32 mmol/L 29   23   Calcium 8.9 - 10.3 mg/dL 9.2   9.5   Total Protein 6.5 - 8.1 g/dL 7.0     Total Bilirubin 0.0 - 1.2 mg/dL 0.8     Alkaline Phos 38 - 126 U/L 150     AST 15 - 41 U/L  79     ALT 0 - 44 U/L 31        RADIOGRAPHIC STUDIES: I have personally reviewed the radiological images as listed and agreed with the findings in the report. CT ABDOMEN PELVIS W CONTRAST Result Date: 11/22/2023 CLINICAL DATA:  Epigastric pain. EXAM: CT ABDOMEN AND PELVIS WITH CONTRAST TECHNIQUE: Multidetector CT imaging of the abdomen and pelvis was performed using the standard protocol following bolus administration of intravenous contrast. RADIATION DOSE REDUCTION: This exam was performed according to the departmental dose-optimization program which includes automated exposure control, adjustment of the mA and/or kV according to patient size and/or use of iterative reconstruction technique. CONTRAST:  OMNIPAQUE IOHEXOL 300 MG/ML  SOLN COMPARISON:  CT abdomen pelvis dated 03/09/2021. FINDINGS: Lower chest: The visualized lung bases are clear. No intra-abdominal free air or free fluid. Hepatobiliary: Multiple hypoenhancing liver lesions consistent with metastatic disease. A representative lesion in the left lobe of the liver measures 4.3 x 3.8 cm. A cluster of lesions in the right lobe of the liver measure approximately 9.3 x 5.1 cm. No biliary dilatation. The gallbladder is unremarkable. Pancreas: Unremarkable. No pancreatic ductal dilatation or surrounding inflammatory changes. Spleen: Normal in size without focal abnormality. Adrenals/Urinary Tract: The adrenal glands are unremarkable. There is no hydronephrosis on either side. There is symmetric enhancement and excretion of contrast by both kidneys. The visualized ureters and urinary bladder appear unremarkable. Stomach/Bowel: Thickened appearance of the gastric antrum and pylorus likely related to underdistention. An infiltrative mass is less likely but not excluded. Endoscopy may provide better evaluation. There is herniation of a segment of the sigmoid colon into the left inguinal canal. There is moderate stool throughout the colon. There is no  bowel obstruction or active inflammation. Appendectomy. Vascular/Lymphatic: Advanced aortoiliac atherosclerotic disease. The IVC is unremarkable. No portal venous gas. Portacaval lymph node  measures 16 mm short axis. Indeterminate low attenuating lesion along the superior aspect of the distal stomach measuring approximately 2 x 4 cm (coronal 22/5 and axial 20/2) represent serosal implant or adenopathy. Reproductive: The prostate gland is mildly enlarged measuring 4.3 cm in transverse axial diameter. Other: There is loss of subcutaneous fat. Musculoskeletal: Osteopenia with degenerative changes spine. No acute osseous pathology. IMPRESSION: 1. Multiple hepatic metastatic lesions. Correlation with history of known malignancy recommended. 2. Serosal implant along the distal stomach versus gastrohepatic adenopathy. 3. Thickened appearance of the gastric antrum and pylorus likely related to underdistention. An infiltrative mass is less likely but not excluded. Endoscopy may provide better evaluation. 4. Herniation of a segment of the sigmoid colon into the left inguinal canal. No bowel obstruction. Electronically Signed   By: Elgie Collard M.D.   On: 11/22/2023 18:18

## 2023-11-27 NOTE — Telephone Encounter (Signed)
 Per LOS on 11/27/23:   IR to US biopsy liver mass - asap 1 week after biopsy - MD   Request sent to IR

## 2023-11-27 NOTE — Assessment & Plan Note (Signed)
 Likely due to liver metastasis.  Pending above workup.

## 2023-11-28 ENCOUNTER — Encounter: Payer: Self-pay | Admitting: Oncology

## 2023-11-28 ENCOUNTER — Inpatient Hospital Stay

## 2023-11-28 ENCOUNTER — Other Ambulatory Visit: Payer: Self-pay

## 2023-11-28 DIAGNOSIS — R160 Hepatomegaly, not elsewhere classified: Secondary | ICD-10-CM | POA: Diagnosis not present

## 2023-11-28 DIAGNOSIS — R748 Abnormal levels of other serum enzymes: Secondary | ICD-10-CM

## 2023-11-28 DIAGNOSIS — R7402 Elevation of levels of lactic acid dehydrogenase (LDH): Secondary | ICD-10-CM

## 2023-11-28 DIAGNOSIS — R978 Other abnormal tumor markers: Secondary | ICD-10-CM

## 2023-11-28 LAB — AFP TUMOR MARKER: AFP, Serum, Tumor Marker: 2.6 ng/mL (ref 0.0–6.4)

## 2023-11-28 LAB — CANCER ANTIGEN 19-9: CA 19-9: 14 U/mL (ref 0–35)

## 2023-11-28 LAB — CA 19-9 (SERIAL): CA 19-9: 14 U/mL (ref 0–35)

## 2023-11-29 LAB — CEA: CEA: 2 ng/mL (ref 0.0–4.7)

## 2023-12-01 LAB — CHROMOGRANIN A: Chromogranin A (ng/mL): 2362 ng/mL — ABNORMAL HIGH (ref 0.0–101.8)

## 2023-12-02 LAB — COMP PANEL: LEUKEMIA/LYMPHOMA

## 2023-12-02 NOTE — Telephone Encounter (Signed)
 Pt scheduled for biospy on 4/24. Please schedule MD approx 1 week after bx.

## 2023-12-02 NOTE — Progress Notes (Signed)
 Art Largo, MD sent to [facility] S PROCEDURE / BIOPSY REVIEW Date: 11/28/23  Requested Biopsy site: Liver Reason for request: Mass Imaging review: Best seen on CT AP  Decision: Approved Imaging modality to perform: Ultrasound Schedule with: Moderate Sedation Schedule for: Any VIR  Additional comments: @Schedulers . US  liver mass Bx.  Please contact me with questions, concerns, or if issue pertaining to this request arise.  Art Largo, MD [facility]

## 2023-12-04 ENCOUNTER — Ambulatory Visit
Admission: RE | Admit: 2023-12-04 | Discharge: 2023-12-04 | Disposition: A | Discharge status: Home / Self Care | Source: Ambulatory Visit | Attending: Oncology | Admitting: Oncology

## 2023-12-04 DIAGNOSIS — R918 Other nonspecific abnormal finding of lung field: Secondary | ICD-10-CM | POA: Insufficient documentation

## 2023-12-04 DIAGNOSIS — R160 Hepatomegaly, not elsewhere classified: Secondary | ICD-10-CM | POA: Diagnosis present

## 2023-12-04 DIAGNOSIS — C787 Secondary malignant neoplasm of liver and intrahepatic bile duct: Secondary | ICD-10-CM | POA: Insufficient documentation

## 2023-12-04 DIAGNOSIS — R9389 Abnormal findings on diagnostic imaging of other specified body structures: Secondary | ICD-10-CM | POA: Insufficient documentation

## 2023-12-04 DIAGNOSIS — C7951 Secondary malignant neoplasm of bone: Secondary | ICD-10-CM | POA: Insufficient documentation

## 2023-12-04 LAB — GLUCOSE, CAPILLARY: Glucose-Capillary: 75 mg/dL (ref 70–99)

## 2023-12-04 MED ORDER — FLUDEOXYGLUCOSE F - 18 (FDG) INJECTION
7.9700 | Freq: Once | INTRAVENOUS | Status: AC | PRN
  Administered 2023-12-04: 7.97 via INTRAVENOUS

## 2023-12-06 ENCOUNTER — Telehealth: Payer: Self-pay

## 2023-12-06 DIAGNOSIS — R160 Hepatomegaly, not elsewhere classified: Secondary | ICD-10-CM

## 2023-12-06 DIAGNOSIS — R948 Abnormal results of function studies of other organs and systems: Secondary | ICD-10-CM

## 2023-12-06 NOTE — Telephone Encounter (Signed)
-----   Message from [name] sent at 12/05/2023 11:43 PM EDT ----- PET showed concerning findings. Is it possible to move his Liver biopsy earlier? Also please arrange him to get brain MRI w wo contrast Thanks.

## 2023-12-06 NOTE — Telephone Encounter (Signed)
 Spoke to pt and informed him of PET results and recommendation for MRI Brain. Pt verbalized understanding.   Checked with IR and 4/24 is the soonest that liver bx can be done.    Please schedule MRI brain prior to visit with MD (5/1) and notify pt of appt details.

## 2023-12-10 ENCOUNTER — Ambulatory Visit
Admission: RE | Admit: 2023-12-10 | Discharge: 2023-12-10 | Disposition: A | Discharge status: Home / Self Care | Source: Ambulatory Visit | Attending: Oncology | Admitting: Oncology

## 2023-12-10 DIAGNOSIS — R948 Abnormal results of function studies of other organs and systems: Secondary | ICD-10-CM | POA: Insufficient documentation

## 2023-12-10 DIAGNOSIS — R160 Hepatomegaly, not elsewhere classified: Secondary | ICD-10-CM | POA: Diagnosis present

## 2023-12-10 MED ORDER — GADOBUTROL 1 MMOL/ML IV SOLN
7.0000 mL | Freq: Once | INTRAVENOUS | Status: AC | PRN
  Administered 2023-12-10: 7 mL via INTRAVENOUS

## 2023-12-11 NOTE — Progress Notes (Addendum)
 Patient for US  guided Liver Biopsy on Thurs 12/12/23, I called and spoke with the patient on the phone and gave pre-procedure instructions. Pt was made aware to be here at 10a, has been holding his ASA 81mg  since 11/27/23, NPO after MN prior to procedure as well as driver post procedure/recovery/discharge. Pt stated understanding.  Called 12/11/23

## 2023-12-12 ENCOUNTER — Other Ambulatory Visit: Payer: Self-pay

## 2023-12-12 ENCOUNTER — Ambulatory Visit
Admission: RE | Admit: 2023-12-12 | Discharge: 2023-12-12 | Disposition: A | Discharge status: Home / Self Care | Source: Ambulatory Visit | Attending: Oncology | Admitting: Oncology

## 2023-12-12 VITALS — BP 128/60 | HR 53 | Temp 97.5°F | Resp 16 | Ht 69.5 in | Wt 144.0 lb

## 2023-12-12 DIAGNOSIS — C787 Secondary malignant neoplasm of liver and intrahepatic bile duct: Secondary | ICD-10-CM | POA: Diagnosis present

## 2023-12-12 DIAGNOSIS — G4733 Obstructive sleep apnea (adult) (pediatric): Secondary | ICD-10-CM | POA: Diagnosis not present

## 2023-12-12 DIAGNOSIS — E1122 Type 2 diabetes mellitus with diabetic chronic kidney disease: Secondary | ICD-10-CM | POA: Insufficient documentation

## 2023-12-12 DIAGNOSIS — C7800 Secondary malignant neoplasm of unspecified lung: Secondary | ICD-10-CM | POA: Diagnosis not present

## 2023-12-12 DIAGNOSIS — K769 Liver disease, unspecified: Secondary | ICD-10-CM

## 2023-12-12 DIAGNOSIS — I2510 Atherosclerotic heart disease of native coronary artery without angina pectoris: Secondary | ICD-10-CM | POA: Diagnosis not present

## 2023-12-12 DIAGNOSIS — Z87891 Personal history of nicotine dependence: Secondary | ICD-10-CM | POA: Diagnosis not present

## 2023-12-12 DIAGNOSIS — Z01812 Encounter for preprocedural laboratory examination: Secondary | ICD-10-CM

## 2023-12-12 DIAGNOSIS — J449 Chronic obstructive pulmonary disease, unspecified: Secondary | ICD-10-CM | POA: Insufficient documentation

## 2023-12-12 DIAGNOSIS — Z85828 Personal history of other malignant neoplasm of skin: Secondary | ICD-10-CM | POA: Insufficient documentation

## 2023-12-12 DIAGNOSIS — R160 Hepatomegaly, not elsewhere classified: Secondary | ICD-10-CM

## 2023-12-12 DIAGNOSIS — I129 Hypertensive chronic kidney disease with stage 1 through stage 4 chronic kidney disease, or unspecified chronic kidney disease: Secondary | ICD-10-CM | POA: Insufficient documentation

## 2023-12-12 DIAGNOSIS — N1832 Chronic kidney disease, stage 3b: Secondary | ICD-10-CM | POA: Diagnosis not present

## 2023-12-12 LAB — BASIC METABOLIC PANEL WITH GFR
Anion gap: 7 (ref 5–15)
BUN: 24 mg/dL — ABNORMAL HIGH (ref 8–23)
CO2: 27 mmol/L (ref 22–32)
Calcium: 9.2 mg/dL (ref 8.9–10.3)
Chloride: 105 mmol/L (ref 98–111)
Creatinine, Ser: 1 mg/dL (ref 0.61–1.24)
GFR, Estimated: >60 mL/min (ref >60)
Glucose, Bld: 96 mg/dL (ref 70–99)
Potassium: 3.8 mmol/L (ref 3.5–5.1)
Sodium: 139 mmol/L (ref 135–145)

## 2023-12-12 LAB — CBC
HCT: 39.9 % (ref 39.0–52.0)
Hemoglobin: 13.5 g/dL (ref 13.0–17.0)
MCH: 30.1 pg (ref 26.0–34.0)
MCHC: 33.8 g/dL (ref 30.0–36.0)
MCV: 88.9 fL (ref 80.0–100.0)
Platelets: 289 10*3/uL (ref 150–400)
RBC: 4.49 MIL/uL (ref 4.22–5.81)
RDW: 13 % (ref 11.5–15.5)
WBC: 6 10*3/uL (ref 4.0–10.5)
nRBC: 0 % (ref 0.0–0.2)

## 2023-12-12 LAB — PROTIME-INR
INR: 1 (ref 0.8–1.2)
Prothrombin Time: 13.9 s (ref 11.4–15.2)

## 2023-12-12 MED ORDER — HYDROCODONE-ACETAMINOPHEN 5-325 MG PO TABS: 1.0000 | ORAL_TABLET | ORAL | Status: DC | PRN

## 2023-12-12 MED ORDER — MIDAZOLAM HCL 2 MG/2ML IJ SOLN
INTRAMUSCULAR | Status: AC
  Filled 2023-12-12: qty 2

## 2023-12-12 MED ORDER — FENTANYL CITRATE (PF) 100 MCG/2ML IJ SOLN
INTRAMUSCULAR | Status: AC
  Filled 2023-12-12: qty 2

## 2023-12-12 MED ORDER — SODIUM CHLORIDE 0.9 % IV SOLN: INTRAVENOUS | Status: DC | PRN

## 2023-12-12 MED ORDER — MIDAZOLAM HCL 2 MG/2ML IJ SOLN
INTRAMUSCULAR | Status: AC | PRN
  Administered 2023-12-12: 1 mg via INTRAVENOUS

## 2023-12-12 MED ORDER — LIDOCAINE HCL (PF) 1 % IJ SOLN
10.0000 mL | Freq: Once | INTRAMUSCULAR | Status: AC
  Administered 2023-12-12: 10 mL via INTRADERMAL
  Filled 2023-12-12: qty 10

## 2023-12-12 MED ORDER — FENTANYL CITRATE (PF) 100 MCG/2ML IJ SOLN
INTRAMUSCULAR | Status: AC | PRN
  Administered 2023-12-12: 50 ug via INTRAVENOUS

## 2023-12-12 NOTE — Procedures (Signed)
 Interventional Radiology Procedure:   Indications: Metastatic disease and needs tissue diagnosis  Procedure: US  guided liver lesion biopsy  Findings: Numerous hepatic lesions.   Core biopsies from a right hepatic lesion.  Gelfoam slurry injected along biopsy tract.   Complications: None     EBL: Minimal  Plan: Bedrest 3 hours  Lev Cervone R. Julietta Ogren, MD  Pager: [phone]

## 2023-12-12 NOTE — Progress Notes (Signed)
 Patient clinically stable post US  liver biopsy per Dr Julietta Ogren, tolerated well. Vitals stable pre and post procedure.  Received Versed  1 mg along with Fentanyl  50 mcg IV for procedure. Report given to Jequetta Thomas RN post procedure/specials./15

## 2023-12-12 NOTE — H&P (Signed)
 Chief Complaint: Liver lesions: request for image guided liver lesion biopsy  Referring Provider(s): Yu,Zhou   Supervising Physician: Elene Griffes  Patient Status: [facility] - Out-pt  History of Present Illness: Tony Mitchell is a 83 y.o. male with a PMH significant for skin CA, COPD, CKD IIIb, OSA (uses cpap), CAD, DM (diet controlled). He received CT imaging to address intermittent abdominal pain which noted multiple hepatic lesions. He was referred to Dr. Wilhelmenia Harada who has consulted IR for image guided liver lesion biopsy to help direct treatment.   He arrives today NPO, with ride/supervision established for post-procedure, and having not taken ASA for 2 weeks per pt. No other blood thinners. See below for full ROS, no complaints today per patient. Family at bedside during interview and consent.  Wears CPAP at night, RA baseline.    Patient is Full Code  Past Medical History:  Diagnosis Date   Acute appendicitis 03/03/2018   Arthritis    Cancer (HCC)    skin cancer   Chronic kidney disease, stage 3b (HCC)    Collapsed lung 2019   x2   COPD (chronic obstructive pulmonary disease) (HCC)    Coronary artery disease    Diabetes mellitus without complication (HCC)    diet controlled-lost 60 lbs and is not on any current meds   GERD (gastroesophageal reflux disease)    Gout    Hyperlipidemia    Hypertension    Sleep apnea    uses cpap   Thoracic aortic aneurysm (HCC)    Vitamin B 12 deficiency     Past Surgical History:  Procedure Laterality Date   CARDIAC CATHETERIZATION     CATARACT EXTRACTION  2012   CHEST TUBE INSERTION     x2   COLONOSCOPY WITH PROPOFOL  N/A 04/14/2021   Procedure: COLONOSCOPY WITH PROPOFOL ;  Surgeon: Shane Darling, MD;  Location: [facility] ENDOSCOPY;  Service: Endoscopy;  Laterality: N/A;   ESOPHAGOGASTRODUODENOSCOPY (EGD) WITH PROPOFOL  N/A 04/14/2021   Procedure: ESOPHAGOGASTRODUODENOSCOPY (EGD) WITH PROPOFOL ;  Surgeon: Shane Darling, MD;   Location: [facility] ENDOSCOPY;  Service: Endoscopy;  Laterality: N/A;   INSERTION OF MESH  09/22/2021   Procedure: INSERTION OF MESH;  Surgeon: Conrado Delay, DO;  Location: [facility] ORS;  Service: General;;   JOINT REPLACEMENT     KNEE SURGERY Left 1965   LAPAROSCOPIC APPENDECTOMY N/A 03/03/2018   Procedure: APPENDECTOMY LAPAROSCOPIC;  Surgeon: Franki Isles, MD;  Location: [facility] ORS;  Service: General;  Laterality: N/A;   TOTAL KNEE ARTHROPLASTY Left 09/16/2018   Procedure: TOTAL KNEE ARTHROPLASTY;  Surgeon: Elner Hahn, MD;  Location: [facility] ORS;  Service: Orthopedics;  Laterality: Left;    Allergies: Patient has no known allergies.  Medications: Prior to Admission medications   Medication Sig Start Date End Date Taking? Authorizing Provider  allopurinol  (ZYLOPRIM ) 100 MG tablet Take 100 mg by mouth in the morning and at bedtime. 06/17/17   [provider]  ALPRAZolam  (XANAX ) 0.5 MG tablet Take 0.5 mg by mouth at bedtime. 09/21/20   [provider]  amLODipine  (NORVASC ) 2.5 MG tablet Take 2.5 mg by mouth in the morning. 04/08/20   [provider]  aspirin  EC 81 MG tablet Take 81 mg by mouth in the morning.    [provider]  colchicine 0.6 MG tablet Take 2 tablets (1.2mg ) by mouth at first sign of gout flare followed by 1 tablet (0.6mg ) after 1 hour. (Max 1.8mg  within 1 hour) 12/05/21   [provider]  fenofibrate  (TRICOR ) 145 MG tablet Take 145 mg by mouth 2 (two) times a week. On Wednesday and Sunday 02/25/20   [provider]  finasteride  (PROSCAR ) 5 MG tablet Take 1 tablet (5 mg total) by mouth daily. 10/08/23   Lawerence Pressman, MD  hydrALAZINE  (APRESOLINE ) 25 MG tablet Take 25 mg by mouth 2 (two) times daily.    [provider]  lansoprazole (PREVACID) 15 MG capsule Take 15 mg by mouth every evening.    [provider]  simvastatin  (ZOCOR ) 20 MG tablet Take 1 tablet by mouth 2 (two) times a week. On  Wednesday and Sunday  12/05/21   [provider]  tamsulosin  (FLOMAX ) 0.4 MG CAPS capsule Take 1 capsule (0.4 mg total) by mouth daily. 09/18/23   Lawerence Pressman, MD  triamcinolone  ointment (KENALOG ) 0.5 % Apply 1 Application topically 2 (two) times daily. 10/07/23   Brimage, Vondra, DO  vitamin B-12 (CYANOCOBALAMIN ) 1000 MCG tablet Take 1,000 mcg by mouth every morning.    [provider]  Vitamin D, Ergocalciferol, (DRISDOL) 1.25 MG (50000 UNIT) CAPS capsule Take 50,000 Units by mouth every 7 (seven) days. 08/21/23 03/01/24  [provider]     Family History  Problem Relation Age of Onset   Cancer Father        Unknown   Prostate cancer Father    Lung cancer Neg Hx     Social History   Socioeconomic History   Marital status: Married    Spouse name: Not on file   Number of children: Not on file   Years of education: Not on file   Highest education level: Not on file  Occupational History   Not on file  Tobacco Use   Smoking status: Former    Current packs/day: 0.00    Average packs/day: 1 pack/day for 40.0 years (40.0 ttl pk-yrs)    Types: Cigarettes    Start date: 06/15/1956    Quit date: 06/15/1996    Years since quitting: 27.5    Passive exposure: Past   Smokeless tobacco: Never  Vaping Use   Vaping status: Never Used  Substance and Sexual Activity   Alcohol use: Not Currently    Alcohol/week: 21.0 standard drinks of alcohol    Types: 21 Shots of liquor per week    Comment: reports last drink 11/03/20   Drug use: No   Sexual activity: Yes  Other Topics Concern   Not on file  Social History Narrative   Not on file   Social Drivers of Health   Financial Resource Strain: Low Risk  (10/30/2023)   Received from [facility]   Overall Financial Resource Strain (CARDIA)    Difficulty of Paying Living Expenses: Not hard at all  Food Insecurity: No Food Insecurity (10/30/2023)   Received from [facility]   Hunger Vital Sign     Worried About Running Out of Food in the Last Year: Never true    Ran Out of Food in the Last Year: Never true  Transportation Needs: No Transportation Needs (10/30/2023)   Received from [facility]    In the past 12 months, has lack of transportation kept you from medical appointments or from getting medications?: No    Lack of Transportation (Non-Medical): No  Physical Activity: Sufficiently Active (07/10/2017)   Received from [facility], [facility]   Exercise Vital Sign    Days  of Exercise per Week: 5 days    Minutes of Exercise per Session: 60 min  Stress: Stress Concern Present (07/10/2017)   Received from [facility], [facility]   [employer] of Occupational Health - Occupational Stress Questionnaire    Feeling of Stress : Rather much  Social Connections: Unknown (07/10/2017)   Received from [facility], [facility]   Social Connection and Isolation Panel [NHANES]    Frequency of Communication with Friends and Family: Patient declined    Frequency of Social Gatherings with Friends and Family: Patient declined    Attends Religious Services: Patient declined    Database administrator or Organizations: Patient declined    Attends Banker Meetings: Patient declined    Marital Status: Patient declined     Review of Systems: A 12 point ROS discussed and pertinent positives are indicated in the HPI above.  All other systems are negative.  Review of Systems  Constitutional:  Negative for chills and fever.  HENT:  Negative for sore throat.   Respiratory:  Negative for shortness of breath.   Cardiovascular:  Negative for chest pain and leg swelling.  Gastrointestinal:  Negative for abdominal pain, blood in stool, nausea and vomiting.  Genitourinary:  Negative for hematuria.  Skin:  Negative for rash and wound.   Hematological:  Does not bruise/bleed easily.    Vital Signs: BP (!) 143/69   Pulse 65   Temp 97.8 F (36.6 C) (Oral)   Resp 18   Ht 5' 9.5" (1.765 m)   Wt 144 lb (65.3 kg)   SpO2 97%   BMI 20.96 kg/m   Advance Care Plan: No documents on file    Physical Exam Constitutional:      Appearance: Normal appearance.  Cardiovascular:     Rate and Rhythm: Normal rate.     Pulses: Normal pulses.     Heart sounds: Normal heart sounds.     Comments: Frequent PACs Pulmonary:     Effort: Pulmonary effort is normal.     Breath sounds: Normal breath sounds.  Abdominal:     General: There is no distension.     Palpations: Abdomen is soft.     Tenderness: There is no abdominal tenderness. There is no guarding.  Musculoskeletal:     Right lower leg: No edema.     Left lower leg: No edema.  Skin:    General: Skin is warm and dry.     Coloration: Skin is not jaundiced.     Comments: No rash or wounds to area of planned puncture  Neurological:     Mental Status: He is alert and oriented to person, place, and time.  Psychiatric:        Mood and Affect: Mood normal.        Behavior: Behavior normal.        Thought Content: Thought content normal.        Judgment: Judgment normal.     Imaging: NM PET Image Initial (PI) Skull Base To Thigh Result Date: 12/04/2023 CLINICAL DATA:  Initial treatment strategy for metastatic disease. Elevated tumor marker chromogranin A. EXAM: NUCLEAR MEDICINE PET SKULL BASE TO THIGH TECHNIQUE: 7.97 mCi F-18 FDG was injected intravenously. Full-ring PET imaging was performed from the skull base to thigh after the radiotracer. CT data was obtained and used for attenuation correction and anatomic localization. Fasting blood glucose: 75 mg/dl COMPARISON:  Abdomen pelvis CT 11/22/2023 and  older. Chest x-ray 11/13/2022. FINDINGS: Mediastinal blood pool activity: SUV max 2.0 NECK: No specific abnormal radiotracer uptake seen in the neck including along lymph node  change of the submandibular, posterior triangle or internal jugular region. There is symmetric uptake seen of the visualized intracranial compartment. Incidental CT findings: Visualized portions of the paranasal sinuses and mastoid air cells are clear. Vascular calcifications are seen. The parotid glands, submandibular glands and thyroid gland are unremarkable. CHEST: There is hypermetabolic infiltrative appearing mass in the superior segment of the left lower lobe with maximum SUV value of 19.1. There are some spiculations identified. The tumor extends from the hilum of the left lower lobe out to the pleura laterally. Lesion on series 6, image 61 measures 3.7 by 2.6 and appearance. This not include the component extending more medial to the hilum. There is confluent abnormal uptake in tissue involving the hilum itself, likely abnormal nodes. Abnormal nodes are also seen extending left paratracheal. The hilar area has maximum SUV approaching 11.5. The left paratracheal lesion has maximum SUV value of 12.7 and dimension on CT image 53 of 3.4 by 2.4 cm. Additional left paratracheal nodes extending more superiorly. The most superior hypermetabolic node is identified just deep to the left subclavian artery on CT image 44 measuring 6 mm in short axis but has maximum SUV value of 8.6. Additional caudal abnormal node subcarinal on the left. No specific hypermetabolic nodes identified in the axillary regions or right hilum. On the PET image there is an area of uptake along the right extreme lung base. There is no nodule in this location. This could be related to misregistration with a dome liver lesion. Incidental CT findings: Mild linear opacity at the lung bases likely scar or atelectasis. No pleural effusion or pneumothorax. Prominent coronary artery calcifications are seen. No pericardial effusion. Diffuse vascular calcifications along the aorta. 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  At least 20 lesions are identified. There are several areas which are confluent in 2 larger measurable lesions. Specific examples would include lesion left hepatic lobe on the previous examination measuring 4.3 x 3.8 cm. This is seen posteriorly along segment 3 and today has maximum SUV value of 15.9. The confluent area of multiple lesions along the inferior right hepatic lobe that measured on the prior contrast CT 89.3 x 5.1 cm, today would have an overall maximum SUV value of 18.2. No other specific areas of abnormal uptake identified along the parenchymal organs. No abnormal uptake along bowel or renal collecting systems. There is only trace uptake along the left adrenal gland, nonspecific. There are some abnormal hypermetabolic upper abdominal nodes. Dominant focus is seen portacaval with maximum SUV 18.3 and this lesion on series 6, image 93 measures 3.1 by 1.9 cm. Small area of abnormal uptake maximum SUV value 3.0 along a left para-node in the upper retroperitoneum measuring 7 mm on image 90. This is medial to the left adrenal gland just superior. Additional Incidental CT findings: Diffuse vascular calcifications are identified. Mild renal atrophy. No obstructing renal or ureteral stones. The bowel is nondilated with diffuse colonic stool. There is a left inguinal hernia involving portions of the sigmoid colon without obstruction or dilatation. Slight wall thickening of the non dilated underdistended stomach. The prior CT report describes an area of thickening along the antrum of the stomach. This is unchanged in appearance by CT but there is no abnormal uptake. This could be simply underdistended. SKELETON: There is multifocal hypermetabolic osseous metastatic disease. These include along  the spine at L5, L4, L3 but also largest have the vertebral body of T5 with maximum SUV value of 14.9. There are foci seen along the right humeral head, left scapula, right iliac crest, left iliac crest and both upper  lateral portions of the sacrum. The right iliac crest lesion anteriorly has some mild mixed lucent and sclerotic foci on CT and has maximum SUV value of 13.7. Incidental CT findings: Scattered degenerative changes seen of the spine and pelvis. Osteopenia. IMPRESSION: Extensive hypermetabolic neoplastic disease including left lower lobe infiltrative mass extending from the pleura to the hilum with numerous abnormal left-sided hilar and mediastinal nodes. Additional upper retroperitoneal nodes as well as extensive liver metastases. Multifocal osseous metastatic disease including the thoracic and lumbar spine, pelvis, right humeral head and left scapula. Based on the overall appearance and with the patient's laboratory a abnormality this very well could be a primary left lower lobe lung lesion such as small cell versus a neuroendocrine tumor with diffuse metastatic disease. Electronically Signed   By: Adrianna Horde M.D.   On: 12/04/2023 20:30   CT ABDOMEN PELVIS W CONTRAST Result Date: 11/22/2023 CLINICAL DATA:  Epigastric pain. EXAM: CT ABDOMEN AND PELVIS WITH CONTRAST TECHNIQUE: Multidetector CT imaging of the abdomen and pelvis was performed using the standard protocol following bolus administration of intravenous contrast. RADIATION DOSE REDUCTION: This exam was performed according to the departmental dose-optimization program which includes automated exposure control, adjustment of the mA and/or kV according to patient size and/or use of iterative reconstruction technique. CONTRAST:  OMNIPAQUE  IOHEXOL  300 MG/ML  SOLN COMPARISON:  CT abdomen pelvis dated 03/09/2021. FINDINGS: Lower chest: The visualized lung bases are clear. No intra-abdominal free air or free fluid. Hepatobiliary: Multiple hypoenhancing liver lesions consistent with metastatic disease. A representative lesion in the left lobe of the liver measures 4.3 x 3.8 cm. A cluster of lesions in the right lobe of the liver measure approximately 9.3 x  5.1 cm. No biliary dilatation. The gallbladder is unremarkable. Pancreas: Unremarkable. No pancreatic ductal dilatation or surrounding inflammatory changes. Spleen: Normal in size without focal abnormality. Adrenals/Urinary Tract: The adrenal glands are unremarkable. There is no hydronephrosis on either side. There is symmetric enhancement and excretion of contrast by both kidneys. The visualized ureters and urinary bladder appear unremarkable. Stomach/Bowel: Thickened appearance of the gastric antrum and pylorus likely related to underdistention. An infiltrative mass is less likely but not excluded. Endoscopy may provide better evaluation. There is herniation of a segment of the sigmoid colon into the left inguinal canal. There is moderate stool throughout the colon. There is no bowel obstruction or active inflammation. Appendectomy. Vascular/Lymphatic: Advanced aortoiliac atherosclerotic disease. The IVC is unremarkable. No portal venous gas. Portacaval lymph node measures 16 mm short axis. Indeterminate low attenuating lesion along the superior aspect of the distal stomach measuring approximately 2 x 4 cm (coronal 22/5 and axial 20/2) represent serosal implant or adenopathy. Reproductive: The prostate gland is mildly enlarged measuring 4.3 cm in transverse axial diameter. Other: There is loss of subcutaneous fat. Musculoskeletal: Osteopenia with degenerative changes spine. No acute osseous pathology. IMPRESSION: 1. Multiple hepatic metastatic lesions. Correlation with history of known malignancy recommended. 2. Serosal implant along the distal stomach versus gastrohepatic adenopathy. 3. Thickened appearance of the gastric antrum and pylorus likely related to underdistention. An infiltrative mass is less likely but not excluded. Endoscopy may provide better evaluation. 4. Herniation of a segment of the sigmoid colon into the left inguinal canal. No bowel obstruction. Electronically  Signed   By: Arash  Radparvar  M.D.   On: 11/22/2023 18:18    Labs:  CBC: Recent Labs    12/12/23 1015  WBC 6.0  HGB 13.5  HCT 39.9  PLT 289    COAGS: Recent Labs    11/27/23 1617 12/12/23 1015  INR 1.0 1.0    BMP: Recent Labs    11/27/23 1617 12/12/23 1015  NA 136 139  K 4.0 3.8  CL 99 105  CO2 29 27  GLUCOSE 102* 96  BUN 16 24*  CALCIUM 9.2 9.2  CREATININE 1.03 1.00  GFRNONAA >60 >60    LIVER FUNCTION TESTS: Recent Labs    11/27/23 1617  BILITOT 0.8  AST 79*  ALT 31  ALKPHOS 150*  PROT 7.0  ALBUMIN 4.3     Assessment and Plan:  Request for image guided liver lesion biopsy reviewed and approved for 12/12/23. VSS. Labs reviewed and WNL 12/12/23. No contraindications for procedure identified in ROS or PE. Meets requirements for moderate sedation. Stopped ASA 2 weeks ago. Will proceed with scheduled procedure.   Risks and benefits of image guided liver lesion biopsy was discussed with the patient and patient's family at bedside including, but not limited to bleeding, infection, damage to adjacent structures or low yield requiring additional tests.  All of the questions were answered and there is agreement to proceed.  Consent signed and in chart.   Thank you for allowing our service to participate in FINDLAY DAGHER 's care.    Electronically Signed: Terressa Fess, NP   12/12/2023, 11:22 AM     I spent a total of  30 Minutes   in face to face in clinical consultation, greater than 50% of which was counseling/coordinating care for request for image guided liver lesion biopsy   (A copy of this note was sent to the referring provider and the time of visit.)

## 2023-12-14 ENCOUNTER — Telehealth: Payer: Self-pay | Admitting: Oncology

## 2023-12-14 ENCOUNTER — Other Ambulatory Visit: Payer: Self-pay | Admitting: Oncology

## 2023-12-14 MED ORDER — OXYCODONE HCL 5 MG PO TABS: 5.0000 mg | ORAL_TABLET | Freq: Four times a day (QID) | ORAL | 0 refills | Status: DC | PRN

## 2023-12-14 MED ORDER — DEXAMETHASONE 4 MG PO TABS: 4.0000 mg | ORAL_TABLET | Freq: Two times a day (BID) | ORAL | 0 refills | Status: DC

## 2023-12-14 NOTE — Telephone Encounter (Signed)
 MRI brain showed 10 x 9 x 10 mm rounded lesion in the posterior left frontal lobe with surrounding vasogenic edema.  Patient was called and results were communicated.  Patient denies any headache, or focal neurological deficit.  He has noticed intermittent right upper quadrant pain.  Recommend Dexamethasone  4mg  BID. He is already on prevacid and I recommend him to continue.  For his pain, recommend oxycodone  5mg  Q4-6h PRN.  Rationale and side effects of medication were reviewed with patient.  I also discussed ER trigger/red flag symptoms.

## 2023-12-16 ENCOUNTER — Other Ambulatory Visit: Payer: Self-pay

## 2023-12-16 ENCOUNTER — Encounter: Payer: Self-pay | Admitting: *Deleted

## 2023-12-16 DIAGNOSIS — G9389 Other specified disorders of brain: Secondary | ICD-10-CM

## 2023-12-16 LAB — SURGICAL PATHOLOGY

## 2023-12-16 NOTE — Progress Notes (Signed)
 Tempus xT, xR, and PDL1 ordered on recent liver biopsy sample ([id number]) per Dr. Wilhelmenia Harada. Financial assistance application to be completed at follow up appt scheduled for tomorrow 4/29. Nothing further needed at this time.

## 2023-12-17 ENCOUNTER — Inpatient Hospital Stay

## 2023-12-17 ENCOUNTER — Inpatient Hospital Stay (HOSPITAL_BASED_OUTPATIENT_CLINIC_OR_DEPARTMENT_OTHER): Admitting: Oncology

## 2023-12-17 ENCOUNTER — Encounter: Payer: Self-pay | Admitting: Oncology

## 2023-12-17 ENCOUNTER — Inpatient Hospital Stay (HOSPITAL_BASED_OUTPATIENT_CLINIC_OR_DEPARTMENT_OTHER): Admitting: Hospice and Palliative Medicine

## 2023-12-17 ENCOUNTER — Encounter: Payer: Self-pay | Admitting: *Deleted

## 2023-12-17 ENCOUNTER — Ambulatory Visit
Admission: RE | Admit: 2023-12-17 | Discharge: 2023-12-17 | Disposition: A | Discharge status: Home / Self Care | Source: Ambulatory Visit | Attending: Radiation Oncology | Admitting: Radiation Oncology

## 2023-12-17 ENCOUNTER — Other Ambulatory Visit: Payer: Self-pay | Admitting: *Deleted

## 2023-12-17 VITALS — BP 167/66 | HR 94 | Temp 98.7°F | Resp 20 | Wt 148.2 lb

## 2023-12-17 DIAGNOSIS — Z7952 Long term (current) use of systemic steroids: Secondary | ICD-10-CM | POA: Insufficient documentation

## 2023-12-17 DIAGNOSIS — M129 Arthropathy, unspecified: Secondary | ICD-10-CM | POA: Insufficient documentation

## 2023-12-17 DIAGNOSIS — E119 Type 2 diabetes mellitus without complications: Secondary | ICD-10-CM | POA: Insufficient documentation

## 2023-12-17 DIAGNOSIS — C7931 Secondary malignant neoplasm of brain: Secondary | ICD-10-CM

## 2023-12-17 DIAGNOSIS — E538 Deficiency of other specified B group vitamins: Secondary | ICD-10-CM | POA: Insufficient documentation

## 2023-12-17 DIAGNOSIS — K219 Gastro-esophageal reflux disease without esophagitis: Secondary | ICD-10-CM | POA: Insufficient documentation

## 2023-12-17 DIAGNOSIS — Z515 Encounter for palliative care: Secondary | ICD-10-CM | POA: Diagnosis not present

## 2023-12-17 DIAGNOSIS — Z7189 Other specified counseling: Secondary | ICD-10-CM | POA: Diagnosis not present

## 2023-12-17 DIAGNOSIS — Z79899 Other long term (current) drug therapy: Secondary | ICD-10-CM | POA: Insufficient documentation

## 2023-12-17 DIAGNOSIS — R160 Hepatomegaly, not elsewhere classified: Secondary | ICD-10-CM | POA: Diagnosis not present

## 2023-12-17 DIAGNOSIS — C7951 Secondary malignant neoplasm of bone: Secondary | ICD-10-CM | POA: Insufficient documentation

## 2023-12-17 DIAGNOSIS — C3492 Malignant neoplasm of unspecified part of left bronchus or lung: Secondary | ICD-10-CM

## 2023-12-17 DIAGNOSIS — Z87891 Personal history of nicotine dependence: Secondary | ICD-10-CM | POA: Insufficient documentation

## 2023-12-17 DIAGNOSIS — E785 Hyperlipidemia, unspecified: Secondary | ICD-10-CM | POA: Insufficient documentation

## 2023-12-17 DIAGNOSIS — R748 Abnormal levels of other serum enzymes: Secondary | ICD-10-CM | POA: Diagnosis not present

## 2023-12-17 DIAGNOSIS — N1832 Chronic kidney disease, stage 3b: Secondary | ICD-10-CM | POA: Insufficient documentation

## 2023-12-17 DIAGNOSIS — I2510 Atherosclerotic heart disease of native coronary artery without angina pectoris: Secondary | ICD-10-CM | POA: Insufficient documentation

## 2023-12-17 DIAGNOSIS — Z7722 Contact with and (suspected) exposure to environmental tobacco smoke (acute) (chronic): Secondary | ICD-10-CM | POA: Insufficient documentation

## 2023-12-17 MED ORDER — ONDANSETRON HCL 8 MG PO TABS: 8.0000 mg | ORAL_TABLET | Freq: Three times a day (TID) | ORAL | 1 refills | Status: DC | PRN

## 2023-12-17 MED ORDER — PROCHLORPERAZINE MALEATE 10 MG PO TABS: 10.0000 mg | ORAL_TABLET | Freq: Four times a day (QID) | ORAL | 1 refills | Status: DC | PRN

## 2023-12-17 NOTE — Consult Note (Signed)
 NEW PATIENT EVALUATION  Name: Tony Mitchell  MRN: [id number]  Date:   12/17/2023     DOB: 02/16/1941   This 83 y.o. 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  REFERRING PHYSICIAN: Antonio Baumgarten, MD  CHIEF COMPLAINT:  Chief Complaint  Patient presents with   Brain Cancer    DIAGNOSIS: The encounter diagnosis was Metastasis to brain [facility]).   PREVIOUS INVESTIGATIONS:  MRI of brain PET CT scan and CT scans reviewed Pathology reports reviewed Clinical notes reviewed  HPI: Patient is an 83 year old male who presented with abdominal pain.  Initial CT scan showed multiple hepatic lesions compatible with metastatic disease.  He also had a serosal implant along the distal stomach.  PET CT scan demonstrated widespread metastatic disease including a left lower lobe infiltrate consistent with primary lung cancer extending from the pleura to the hilum with numerous abnormal left sided hilar and mediastinal nodes.  Extensive liver metastasis as well as multifocal osseous metastatic disease including thoracic lumbar spine pelvis right humeral head and left scapula.  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  He had an MRI of his brain which showed a 10 x 9 x 10 mm rounded lesion the posterior left frontal lobe with surrounding vasogenic edema consistent with metastatic disease.  No other pathologic enhancement was noted.  He is asymptomatic.  Specifically denies any change in visual field any focal neurologic deficits.  He is having very little symptoms as far as his lungs are concerned although does occasionally have some slight cough on deep inspiration.  He is having no bone pain.  I been asked to evaluate him for consideration of radiation therapy for his solitary brain metastasis.  PLANNED TREATMENT REGIMEN: SRS  PAST MEDICAL  HISTORY:  has a past medical history of Acute appendicitis (03/03/2018), Arthritis, Cancer (HCC), Chronic kidney disease, stage 3b (HCC), Collapsed lung (2019), COPD (chronic obstructive pulmonary disease) (HCC), Coronary artery disease, Diabetes mellitus without complication (HCC), GERD (gastroesophageal reflux disease), Gout, Hyperlipidemia, Hypertension, Sleep apnea, Thoracic aortic aneurysm (HCC), and Vitamin B 12 deficiency.    PAST SURGICAL HISTORY:  Past Surgical History:  Procedure Laterality Date   CARDIAC CATHETERIZATION     CATARACT EXTRACTION  2012   CHEST TUBE INSERTION     x2   COLONOSCOPY WITH PROPOFOL  N/A 04/14/2021   Procedure: COLONOSCOPY WITH PROPOFOL ;  Surgeon: Shane Darling, MD;  Location: [facility] ENDOSCOPY;  Service: Endoscopy;  Laterality: N/A;   ESOPHAGOGASTRODUODENOSCOPY (EGD) WITH PROPOFOL  N/A 04/14/2021   Procedure: ESOPHAGOGASTRODUODENOSCOPY (EGD) WITH PROPOFOL ;  Surgeon: Shane Darling, MD;  Location: [facility] ENDOSCOPY;  Service: Endoscopy;  Laterality: N/A;   INSERTION OF MESH  09/22/2021   Procedure: INSERTION OF MESH;  Surgeon: Conrado Delay, DO;  Location: [facility] ORS;  Service: General;;   JOINT REPLACEMENT     KNEE SURGERY Left 1965   LAPAROSCOPIC APPENDECTOMY N/A 03/03/2018   Procedure: APPENDECTOMY LAPAROSCOPIC;  Surgeon: Franki Isles, MD;  Location: [facility] ORS;  Service: General;  Laterality: N/A;   TOTAL KNEE ARTHROPLASTY Left 09/16/2018   Procedure: TOTAL KNEE ARTHROPLASTY;  Surgeon: Elner Hahn, MD;  Location: [facility] ORS;  Service: Orthopedics;  Laterality: Left;    FAMILY HISTORY: family history includes Cancer in his father; Prostate cancer in his father.  SOCIAL HISTORY:  reports that he quit smoking about 27 years ago. His smoking use included cigarettes.  He started smoking about 67 years ago. He has a 40 pack-year smoking history. He has been exposed to tobacco smoke. He has never used smokeless tobacco. He reports that he does not currently  use alcohol after a past usage of about 21.0 standard drinks of alcohol per week. He reports that he does not use drugs.  ALLERGIES: Patient has no known allergies.  MEDICATIONS:  Current Outpatient Medications  Medication Sig Dispense Refill   allopurinol  (ZYLOPRIM ) 100 MG tablet Take 100 mg by mouth in the morning and at bedtime.     ALPRAZolam  (XANAX ) 0.5 MG tablet Take 0.5 mg by mouth at bedtime.     amLODipine  (NORVASC ) 2.5 MG tablet Take 2.5 mg by mouth in the morning.     aspirin  EC 81 MG tablet Take 81 mg by mouth in the morning.     colchicine 0.6 MG tablet Take 2 tablets (1.2mg ) by mouth at first sign of gout flare followed by 1 tablet (0.6mg ) after 1 hour. (Max 1.8mg  within 1 hour) (Patient not taking: Reported on 12/12/2023)     dexamethasone  (DECADRON ) 4 MG tablet Take 1 tablet (4 mg total) by mouth 2 (two) times daily. 30 tablet 0   fenofibrate  (TRICOR ) 145 MG tablet Take 145 mg by mouth 2 (two) times a week. On Wednesday and Sunday     finasteride  (PROSCAR ) 5 MG tablet Take 1 tablet (5 mg total) by mouth daily. 90 tablet 3   gabapentin  (NEURONTIN ) 100 MG capsule Take 100 mg by mouth.     hydrALAZINE  (APRESOLINE ) 25 MG tablet Take 25 mg by mouth 2 (two) times daily.     lansoprazole (PREVACID) 15 MG capsule Take 15 mg by mouth every evening.     oxyCODONE  (OXY IR/ROXICODONE ) 5 MG immediate release tablet Take 1 tablet (5 mg total) by mouth every 6 (six) hours as needed for severe pain (pain score 7-10) or breakthrough pain. 10 tablet 0   simvastatin  (ZOCOR ) 20 MG tablet Take 1 tablet by mouth 2 (two) times a week. On  Wednesday and Sunday     tamsulosin  (FLOMAX ) 0.4 MG CAPS capsule Take 1 capsule (0.4 mg total) by mouth daily. 30 capsule 11   triamcinolone  ointment (KENALOG ) 0.5 % Apply 1 Application topically 2 (two) times daily. (Patient not taking: Reported on 12/12/2023) 30 g 0   vitamin B-12 (CYANOCOBALAMIN ) 1000 MCG tablet Take 1,000 mcg by mouth every morning.     Vitamin D,  Ergocalciferol, (DRISDOL) 1.25 MG (50000 UNIT) CAPS capsule Take 50,000 Units by mouth every 7 (seven) days.     No current facility-administered medications for this encounter.    ECOG PERFORMANCE STATUS:  0 - Asymptomatic  REVIEW OF SYSTEMS: Patient denies any weight loss, fatigue, weakness, fever, chills or night sweats. Patient denies any loss of vision, blurred vision. Patient denies any ringing  of the ears or hearing loss. No irregular heartbeat. Patient denies heart murmur or history of fainting. Patient denies any chest pain or pain radiating to her upper extremities. Patient denies any shortness of breath, difficulty breathing at night, cough or hemoptysis. Patient denies any swelling in the lower legs. Patient denies any nausea vomiting, vomiting of blood, or coffee ground material in the vomitus. Patient denies any stomach pain. Patient states has had normal bowel movements no significant constipation or diarrhea. Patient denies any dysuria, hematuria or significant nocturia. Patient denies any problems walking, swelling in the joints or loss of balance. Patient denies any skin changes, loss of  hair or loss of weight. Patient denies any excessive worrying or anxiety or significant depression. Patient denies any problems with insomnia. Patient denies excessive thirst, polyuria, polydipsia. Patient denies any swollen glands, patient denies easy bruising or easy bleeding. Patient denies any recent infections, allergies or URI. Patient "s visual fields have not changed significantly in recent time.   PHYSICAL EXAM: There were no vitals taken for this visit. Crude visual fields are within normal range motor sensory and Diette levels are equal and symmetric in upper lower extremity extremities.  Proprioception is intact.  Well-developed well-nourished patient in NAD. HEENT reveals PERLA, EOMI, discs not visualized.  Oral cavity is clear. No oral mucosal lesions are identified. Neck is clear without  evidence of cervical or supraclavicular adenopathy. Lungs are clear to A&P. Cardiac examination is essentially unremarkable with regular rate and rhythm without murmur rub or thrill. Abdomen is benign with no organomegaly or masses noted. Motor sensory and DTR levels are equal and symmetric in the upper and lower extremities. Cranial nerves II through XII are grossly intact. Proprioception is intact. No peripheral adenopathy or edema is identified. No motor or sensory levels are noted. Crude visual fields are within normal range.  LABORATORY DATA: Pathology reports reviewed    RADIOLOGY RESULTS: CT scans PET scan and MRI brain all reviewed compatible with above-stated findings   IMPRESSION: Solitary brain metastasis in patient with known widespread metastatic disease from most likely primary squamous cell carcinoma of the lung in 83 year old male  PLAN: At this time would like to go ahead with SRS.  Will plan on vert delivering 20 Gray in 1 fraction.  Would use a line RT for tumor localization.  Patient is already been started on Decadron  which we will continue through treatment.  Risks and benefits of treatment clued and hair loss fatigue alteration blood counts all reviewed in detail with the patient.  I have set him up for a thin cut MRI scan to rule out possibility of any further sites of metastatic disease and for treatment planning purposes.  We will set up simulation shortly after his MRI scan is performed.  Patient comprehends my recommendations well.  I have also explained to him that other areas of palliation including possibility of decreasing pain from bone metastasis possibility of preventing further atelectasis or hemoptysis all could be achieved with radiation should those problems arise.  Patient comprehends my recommendations well.  I would like to take this opportunity to thank you for allowing me to participate in the care of your patient.Glenis Langdon, MD

## 2023-12-17 NOTE — Progress Notes (Signed)
 Financial assistance application completed and submitted online. Approved for $0 out of pocket expense.

## 2023-12-17 NOTE — Progress Notes (Signed)
 [facility] Clinical Social Work  Clinical Social Work was referred by medical provider, [employer], for assessment of psychosocial needs.  Clinical Social Worker contacted patient by phone to offer support and assess for needs.    Patient was not available for an in-depth conversation.  It was agreed upon that patient and CSW would meet after his Patient Education on Thursday, May 1st, at 11am.     Kennth Peal, LCSW  Clinical Social Worker [facility]

## 2023-12-17 NOTE — Assessment & Plan Note (Signed)
 Imaging results and pathology results were reviewed and discussed with patient. 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 I recommend to send off NGS plus PD-L1. Recommend palliative chemotherapy with carboplatin, Taxol, Keytruda.  Patient understands that treatment is with palliative intent.  Given that he will start brain radiation for treatment of brain metastasis, I will hold off Keytruda for now.  Plan carboplatin/Taxol next week. Rationale and potential side effects of chemotherapy and immunotherapy were discussed in details with patient.  He is in agreement. Mediport option was discussed.  Patient prefers to utilize peripheral vein and if in the future it is difficult to obtain IV access, he will consider Mediport placement. Will arrange patient to get chemotherapy class, anticipate to start treatment next week.

## 2023-12-17 NOTE — Progress Notes (Signed)
 START ON PATHWAY REGIMEN - Non-Small Cell Lung     A cycle is every 21 days:     Paclitaxel      Carboplatin   **Always confirm dose/schedule in your pharmacy ordering system**  Patient Characteristics: Stage IV Metastatic, Squamous, Awaiting Molecular Test Results and Need to Start Chemotherapy, PS = 0 - 2 Therapeutic Status: Stage IV Metastatic Histology: Squamous Cell ECOG Performance Status: 1 Intent of Therapy: Non-Curative / Palliative Intent, Discussed with Patient

## 2023-12-17 NOTE — Progress Notes (Signed)
 Palliative Medicine [facility] at [facility] [phone] [phone]   Name: Tony Mitchell Date: 12/17/2023 MRN: [id number]  DOB: May 29, 1941  Patient Care Team: Tony Baumgarten, MD as PCP - General (Internal Medicine) Tony Forbes, MD as Consulting Physician (Oncology) Tony Gens, RN as Oncology Nurse Navigator    REASON FOR CONSULTATION: Tony Mitchell is a 83 y.o. 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   SOCIAL HISTORY:     reports that he quit smoking about 27 years ago. His smoking use included cigarettes. He started smoking about 67 years ago. He has a 40 pack-year smoking history. He has been exposed to tobacco smoke. He has never used smokeless tobacco. He reports that he does not currently use alcohol after a past usage of about 21.0 standard drinks of alcohol per week. He reports that he does not use drugs.  Patient is married and lives at home with his wife and two beagles.  His only daughter died in 28-Jun-2023.  Patient has a granddaughter in [place] .  Patient owned a pool business for 50 years.  ADVANCE DIRECTIVES:  Not on file  CODE STATUS:   PAST MEDICAL HISTORY: Past Medical History:  Diagnosis Date   Acute appendicitis 03/03/2018   Arthritis    Cancer (HCC)    skin cancer   Chronic kidney disease, stage 3b (HCC)    Collapsed lung 2019   x2   COPD (chronic obstructive pulmonary disease) (HCC)    Coronary artery disease    Diabetes mellitus without complication (HCC)    diet controlled-lost 60 lbs and is not on any current meds   GERD (gastroesophageal reflux disease)    Gout    Hyperlipidemia    Hypertension    Sleep apnea    uses cpap   Thoracic aortic aneurysm (HCC)    Vitamin B 12 deficiency     PAST SURGICAL HISTORY:  Past Surgical History:  Procedure Laterality Date   CARDIAC  CATHETERIZATION     CATARACT EXTRACTION  2012   CHEST TUBE INSERTION     x2   COLONOSCOPY WITH PROPOFOL  N/A 04/14/2021   Procedure: COLONOSCOPY WITH PROPOFOL ;  Surgeon: Tony Darling, MD;  Location: [facility] END[facility]COPY;  Service: Endoscopy;  Laterality: N/A;   ESOPHAGOGASTRODUODENOSCOPY (EGD) WITH PROPOFOL  N/A 04/14/2021   Procedure: ESOPHAGOGASTRODUODENOSCOPY (EGD) WITH PROPOFOL ;  Surgeon: Tony Darling, MD;  Location: [facility] ENDOSCOPY;  Service: Endoscopy;  Laterality: N/A;   INSERTION OF MESH  09/22/2021   Procedure: INSERTION OF MESH;  Surgeon: Tony Delay, DO;  Location: [facility] ORS;  Service: General;;   JOINT REPLACEMENT     KNEE SURGERY Left 1965   LAPAROSCOPIC APPENDECTOMY N/A 03/03/2018   Procedure: APPENDECTOMY LAPAROSCOPIC;  Surgeon: Tony Isles, MD;  Location: [facility] ORS;  Service: General;  Laterality: N/A;   TOTAL KNEE ARTHROPLASTY Left 09/16/2018   Procedure: TOTAL KNEE ARTHROPLASTY;  Surgeon: Tony Hahn, MD;  Location: [facility] ORS;  Service: Orthopedics;  Laterality: Left;    HEMATOLOGY/ONCOLOGY HISTORY:  Oncology History  Squamous cell carcinoma lung, left (HCC)  12/17/2023 Initial Diagnosis   Squamous cell carcinoma lung, left (HCC)   12/17/2023 Cancer Staging   Staging form: Lung, AJCC V9 - Clinical stage from 12/17/2023: Stage IVA (cT2, pM1a) - Signed by Tony Forbes, MD on 12/17/2023 Stage prefix: Initial diagnosis     ALLERGIES:  has no known allergies.  MEDICATIONS:  Current Outpatient Medications  Medication Sig Dispense Refill   allopurinol  (ZYLOPRIM ) 100 MG tablet Take 100 mg by mouth in the morning and at bedtime.     ALPRAZolam  (XANAX ) 0.5 MG tablet Take 0.5 mg by mouth at bedtime.     amLODipine  (NORVASC ) 2.5 MG tablet Take 2.5 mg by mouth in the morning.     aspirin  EC 81 MG tablet Take 81 mg by mouth in the morning.     colchicine 0.6 MG tablet Take 2 tablets (1.2mg ) by mouth at first sign of gout flare followed by 1 tablet (0.6mg ) after 1 hour.  (Max 1.8mg  within 1 hour) (Patient not taking: Reported on 12/12/2023)     dexamethasone  (DECADRON ) 4 MG tablet Take 1 tablet (4 mg total) by mouth 2 (two) times daily. 30 tablet 0   fenofibrate  (TRICOR ) 145 MG tablet Take 145 mg by mouth 2 (two) times a week. On Wednesday and Sunday     finasteride  (PROSCAR ) 5 MG tablet Take 1 tablet (5 mg total) by mouth daily. 90 tablet 3   gabapentin  (NEURONTIN ) 100 MG capsule Take 100 mg by mouth.     hydrALAZINE  (APRESOLINE ) 25 MG tablet Take 25 mg by mouth 2 (two) times daily.     lansoprazole (PREVACID) 15 MG capsule Take 15 mg by mouth every evening.     oxyCODONE  (OXY IR/ROXICODONE ) 5 MG immediate release tablet Take 1 tablet (5 mg total) by mouth every 6 (six) hours as needed for severe pain (pain score 7-10) or breakthrough pain. 10 tablet 0   simvastatin  (ZOCOR ) 20 MG tablet Take 1 tablet by mouth 2 (two) times a week. On  Wednesday and Sunday     tamsulosin  (FLOMAX ) 0.4 MG CAPS capsule Take 1 capsule (0.4 mg total) by mouth daily. 30 capsule 11   triamcinolone  ointment (KENALOG ) 0.5 % Apply 1 Application topically 2 (two) times daily. (Patient not taking: Reported on 12/12/2023) 30 g 0   vitamin B-12 (CYANOCOBALAMIN ) 1000 MCG tablet Take 1,000 mcg by mouth every morning.     Vitamin D, Ergocalciferol, (DRISDOL) 1.25 MG (50000 UNIT) CAPS capsule Take 50,000 Units by mouth every 7 (seven) days.     No current facility-administered medications for this visit.    VITAL SIGNS: There were no vitals taken for this visit. There were no vitals filed for this visit.  Estimated body mass index is 21.57 kg/m as calculated from the following:   Height as of 12/12/23: 5' 9.5" (1.765 m).   Weight as of an earlier encounter on 12/17/23: 148 lb 3.2 oz (67.2 kg).  LABS: CBC:    Component Value Date/Time   WBC 6.0 12/12/2023 1015   HGB 13.5 12/12/2023 1015   HGB 13.3 05/27/2014 1712   HCT 39.9 12/12/2023 1015   HCT 40.9 05/27/2014 1712   PLT 289 12/12/2023  1015   PLT 198 05/27/2014 1712   MCV 88.9 12/12/2023 1015   MCV 92 05/27/2014 1712   NEUTROABS 5.2 09/17/2018 0411   LYMPHSABS 1.4 09/17/2018 0411   MONOABS 0.6 09/17/2018 0411   EOSABS 0.1 09/17/2018 0411   BASOSABS 0.0 09/17/2018 0411   Comprehensive Metabolic Panel:    Component Value Date/Time   NA 139 12/12/2023 1015   NA 140 05/27/2014 1712   K 3.8 12/12/2023 1015   K 3.8 05/27/2014 1712   CL 105 12/12/2023 1015   CL 105 05/27/2014 1712   CO2 27 12/12/2023 1015   CO2 28 05/27/2014 1712  BUN 24 (H) 12/12/2023 1015   BUN 11 05/27/2014 1712   CREATININE 1.00 12/12/2023 1015   CREATININE 1.02 05/27/2014 1712   GLUCOSE 96 12/12/2023 1015   GLUCOSE 120 (H) 05/27/2014 1712   CALCIUM 9.2 12/12/2023 1015   CALCIUM 8.2 (L) 05/27/2014 1712   AST 79 (H) 11/27/2023 1617   ALT 31 11/27/2023 1617   ALKPHOS 150 (H) 11/27/2023 1617   BILITOT 0.8 11/27/2023 1617   PROT 7.0 11/27/2023 1617   ALBUMIN 4.3 11/27/2023 1617    RADIOGRAPHIC STUDIES: US  BIOPSY (LIVER) Result Date: 12/12/2023 INDICATION: 83 year old with concern for metastatic disease. Patient has numerous liver lesions and needs a tissue diagnosis. EXAM: ULTRASOUND-GUIDED LIVER LESION BIOPSY MEDICATIONS: Moderate sedation ANESTHESIA/SEDATION: Moderate (conscious) sedation was employed during this procedure. A total of Versed  1 mg and Fentanyl  50 mcg was administered intravenously by the radiology nurse. Total intra-service moderate Sedation Time: 18 minutes. The patient's level of consciousness and vital signs were monitored continuously by radiology nursing throughout the procedure under my direct supervision. FLUOROSCOPY TIME:  None COMPLICATIONS: None immediate. PROCEDURE: Informed written consent was obtained from the patient after a thorough discussion of the procedural risks, benefits and alternatives. All questions were addressed. Maximal Sterile Barrier Technique was utilized including caps, mask, sterile gowns, sterile  gloves, sterile drape, hand hygiene and skin antiseptic. A timeout was performed prior to the initiation of the procedure. Liver was evaluated with ultrasound. Numerous hepatic lesions were identified. The right anterior abdomen was prepped with chlorhexidine  and sterile field was created. Skin was anesthetized using 1% lidocaine . Small incision was made. Using ultrasound guidance, 17 gauge coaxial needle was directed into a right hepatic lesion. Total of 5 core biopsies were performed with an 18 gauge core device. Adequate specimens obtained and placed in formalin. Gel-Foam slurry was injected as the 17 gauge needle was removed. Bandage placed over the puncture site. FINDINGS: Innumerable hepatic lesions. Many of hepatic lesions are partially cystic and necrotic. A hyperechoic lesion that appeared to be mostly solid in the right hepatic lobe was targeted. Biopsy needle confirmed within the lesion. No immediate bleeding or hematoma formation after the biopsy. IMPRESSION: Ultrasound-guided core biopsy of a right hepatic lesion. Electronically Signed   By: Elene Griffes M.D.   On: 12/12/2023 13:33   MR Brain W Wo Contrast Result Date: 12/12/2023 CLINICAL DATA:  Liver lesion.  Diffuse metastatic disease. EXAM: MRI HEAD WITHOUT AND WITH CONTRAST TECHNIQUE: Multiplanar, multiecho pulse sequences of the brain and surrounding structures were obtained without and with intravenous contrast. CONTRAST:  7mL GADAVIST  GADOBUTROL  1 MMOL/ML IV SOLN COMPARISON:  None Available. FINDINGS: Brain: A rounded lesion in the posterior left frontal lobe measures 10 x 9 x 10 mm on image 132 of series 16. Surrounding vasogenic edema is present. No other pathologic enhancement is present. No acute infarct or hemorrhage is present. Periventricular and scattered subcortical T2 hyperintensities are mildly advanced for age. Deep brain nuclei are within normal limits. The ventricles are of normal size. No significant extraaxial fluid collection is  present. The brainstem and cerebellum are within normal limits. The internal auditory canals are within normal limits. Midline structures are within normal limits. Vascular: Insert normal flow Skull and upper cervical spine: The craniocervical junction is normal. Upper cervical spine is within normal limits. Marrow signal is unremarkable. Sinuses/Orbits: The paranasal sinuses and mastoid air cells are clear. Bilateral lens replacements are noted. Globes and orbits are otherwise unremarkable. IMPRESSION: 1. 10 x 9 x 10 mm rounded lesion  in the posterior left frontal lobe with surrounding vasogenic edema is consistent with metastatic disease. 2. No other pathologic enhancement is present. 3. Periventricular and scattered subcortical T2 hyperintensities bilaterally are mildly advanced for age. The finding is nonspecific but can be seen in the setting of chronic microvascular ischemia, a demyelinating process such as multiple sclerosis, vasculitis, complicated migraine headaches, or as the sequelae of a prior infectious or inflammatory process. Electronically Signed   By: Audree Leas M.D.   On: 12/12/2023 13:32   NM PET Image Initial (PI) Skull Base To Thigh Result Date: 12/04/2023 CLINICAL DATA:  Initial treatment strategy for metastatic disease. Elevated tumor marker chromogranin A. EXAM: NUCLEAR MEDICINE PET SKULL BASE TO THIGH TECHNIQUE: 7.97 mCi F-18 FDG was injected intravenously. Full-ring PET imaging was performed from the skull base to thigh after the radiotracer. CT data was obtained and used for attenuation correction and anatomic localization. Fasting blood glucose: 75 mg/dl COMPARISON:  Abdomen pelvis CT 11/22/2023 and older. Chest x-ray 11/13/2022. FINDINGS: Mediastinal blood pool activity: SUV max 2.0 NECK: No specific abnormal radiotracer uptake seen in the neck including along lymph node change of the submandibular, posterior triangle or internal jugular region. There is symmetric uptake  seen of the visualized intracranial compartment. Incidental CT findings: Visualized portions of the paranasal sinuses and mastoid air cells are clear. Vascular calcifications are seen. The parotid glands, submandibular glands and thyroid gland are unremarkable. CHEST: There is hypermetabolic infiltrative appearing mass in the superior segment of the left lower lobe with maximum SUV value of 19.1. There are some spiculations identified. The tumor extends from the hilum of the left lower lobe out to the pleura laterally. Lesion on series 6, image 61 measures 3.7 by 2.6 and appearance. This not include the component extending more medial to the hilum. There is confluent abnormal uptake in tissue involving the hilum itself, likely abnormal nodes. Abnormal nodes are also seen extending left paratracheal. The hilar area has maximum SUV approaching 11.5. The left paratracheal lesion has maximum SUV value of 12.7 and dimension on CT image 53 of 3.4 by 2.4 cm. Additional left paratracheal nodes extending more superiorly. The most superior hypermetabolic node is identified just deep to the left subclavian artery on CT image 44 measuring 6 mm in short axis but has maximum SUV value of 8.6. Additional caudal abnormal node subcarinal on the left. No specific hypermetabolic nodes identified in the axillary regions or right hilum. On the PET image there is an area of uptake along the right extreme lung base. There is no nodule in this location. This could be related to misregistration with a dome liver lesion. Incidental CT findings: Mild linear opacity at the lung bases likely scar or atelectasis. No pleural effusion or pneumothorax. Prominent coronary artery calcifications are seen. No pericardial effusion. Diffuse vascular calcifications along the aorta. 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 At least 20 lesions are identified. There are several areas which are confluent in 2 larger measurable  lesions. Specific examples would include lesion left hepatic lobe on the previous examination measuring 4.3 x 3.8 cm. This is seen posteriorly along segment 3 and today has maximum SUV value of 15.9. The confluent area of multiple lesions along the inferior right hepatic lobe that measured on the prior contrast CT 89.3 x 5.1 cm, today would have an overall maximum SUV value of 18.2. No other specific areas of abnormal uptake identified along the parenchymal organs. No abnormal uptake along bowel or renal collecting systems. There  is only trace uptake along the left adrenal gland, nonspecific. There are some abnormal hypermetabolic upper abdominal nodes. Dominant focus is seen portacaval with maximum SUV 18.3 and this lesion on series 6, image 93 measures 3.1 by 1.9 cm. Small area of abnormal uptake maximum SUV value 3.0 along a left para-node in the upper retroperitoneum measuring 7 mm on image 90. This is medial to the left adrenal gland just superior. Additional Incidental CT findings: Diffuse vascular calcifications are identified. Mild renal atrophy. No obstructing renal or ureteral stones. The bowel is nondilated with diffuse colonic stool. There is a left inguinal hernia involving portions of the sigmoid colon without obstruction or dilatation. Slight wall thickening of the non dilated underdistended stomach. The prior CT report describes an area of thickening along the antrum of the stomach. This is unchanged in appearance by CT but there is no abnormal uptake. This could be simply underdistended. SKELETON: There is multifocal hypermetabolic osseous metastatic disease. These include along the spine at L5, L4, L3 but also largest have the vertebral body of T5 with maximum SUV value of 14.9. There are foci seen along the right humeral head, left scapula, right iliac crest, left iliac crest and both upper lateral portions of the sacrum. The right iliac crest lesion anteriorly has some mild mixed lucent and  sclerotic foci on CT and has maximum SUV value of 13.7. Incidental CT findings: Scattered degenerative changes seen of the spine and pelvis. Osteopenia. IMPRESSION: Extensive hypermetabolic neoplastic disease including left lower lobe infiltrative mass extending from the pleura to the hilum with numerous abnormal left-sided hilar and mediastinal nodes. Additional upper retroperitoneal nodes as well as extensive liver metastases. Multifocal osseous metastatic disease including the thoracic and lumbar spine, pelvis, right humeral head and left scapula. Based on the overall appearance and with the patient's laboratory a abnormality this very well could be a primary left lower lobe lung lesion such as small cell versus a neuroendocrine tumor with diffuse metastatic disease. Electronically Signed   By: Adrianna Horde M.D.   On: 12/04/2023 20:30   CT ABDOMEN PELVIS W CONTRAST Result Date: 11/22/2023 CLINICAL DATA:  Epigastric pain. EXAM: CT ABDOMEN AND PELVIS WITH CONTRAST TECHNIQUE: Multidetector CT imaging of the abdomen and pelvis was performed using the standard protocol following bolus administration of intravenous contrast. RADIATION DOSE REDUCTION: This exam was performed according to the departmental dose-optimization program which includes automated exposure control, adjustment of the mA and/or kV according to patient size and/or use of iterative reconstruction technique. CONTRAST:  OMNIPAQUE  IOHEXOL  300 MG/ML  SOLN COMPARISON:  CT abdomen pelvis dated 03/09/2021. FINDINGS: Lower chest: The visualized lung bases are clear. No intra-abdominal free air or free fluid. Hepatobiliary: Multiple hypoenhancing liver lesions consistent with metastatic disease. A representative lesion in the left lobe of the liver measures 4.3 x 3.8 cm. A cluster of lesions in the right lobe of the liver measure approximately 9.3 x 5.1 cm. No biliary dilatation. The gallbladder is unremarkable. Pancreas: Unremarkable. No pancreatic  ductal dilatation or surrounding inflammatory changes. Spleen: Normal in size without focal abnormality. Adrenals/Urinary Tract: The adrenal glands are unremarkable. There is no hydronephrosis on either side. There is symmetric enhancement and excretion of contrast by both kidneys. The visualized ureters and urinary bladder appear unremarkable. Stomach/Bowel: Thickened appearance of the gastric antrum and pylorus likely related to underdistention. An infiltrative mass is less likely but not excluded. Endoscopy may provide better evaluation. There is herniation of a segment of the sigmoid colon into  the left inguinal canal. There is moderate stool throughout the colon. There is no bowel obstruction or active inflammation. Appendectomy. Vascular/Lymphatic: Advanced aortoiliac atherosclerotic disease. The IVC is unremarkable. No portal venous gas. Portacaval lymph node measures 16 mm short axis. Indeterminate low attenuating lesion along the superior aspect of the distal stomach measuring approximately 2 x 4 cm (coronal 22/5 and axial 20/2) represent serosal implant or adenopathy. Reproductive: The prostate gland is mildly enlarged measuring 4.3 cm in transverse axial diameter. Other: There is loss of subcutaneous fat. Musculoskeletal: Osteopenia with degenerative changes spine. No acute osseous pathology. IMPRESSION: 1. Multiple hepatic metastatic lesions. Correlation with history of known malignancy recommended. 2. Serosal implant along the distal stomach versus gastrohepatic adenopathy. 3. Thickened appearance of the gastric antrum and pylorus likely related to underdistention. An infiltrative mass is less likely but not excluded. Endoscopy may provide better evaluation. 4. Herniation of a segment of the sigmoid colon into the left inguinal canal. No bowel obstruction. Electronically Signed   By: Angus Bark M.D.   On: 11/22/2023 18:18    PERFORMANCE STATUS (ECOG) : 1 - Symptomatic but completely  ambulatory  Review of Systems Unless otherwise noted, a complete review of systems is negative.  Physical Exam General: NAD Pulmonary: Unlabored Extremities: no edema, no joint deformities Skin: no rashes Neurological: nonfocal  IMPRESSION: I met with patient following his visit with Dr. Wilhelmenia Harada.  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  Patient understands that this prognosis is limited and treatment is with palliative intent.  He is in agreement with proceeding with treatment.  Patient is speaking with his attorney to update advanced directives.  Patient states that he would want his wife to be involved in decision-making.  However, he is considering an alternative healthcare proxy given his wife's poor health.  Patient also sent home with a MOST form.  At baseline, patient lives at home with his wife.  He is her primary caregiver as she is oxygen dependent and has chronic pain.  Patient has strong faith in God. 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  Emotional support provided as patient describes significant loss over the past year.  His daughter died right before Christmas.  Patient has also lost a sister and friend over the past year.  Will refer to social work and [employer].  Symptomatically, patient states that he is doing well.  Denies any distressing symptoms at present.  PLAN: -Continue current scope of treatment - Referrals to social work and [employer] and nutrition - Patient speaking with his attorney to update ACP - MOST Form reviewed - Follow-up 2 to 3 weeks  Case and plan discussed with Dr. Wilhelmenia Harada  Patient expressed understanding and was in agreement with this plan. He also understands that He can call the clinic at any time with any questions, concerns, or complaints.     Time Total: 30 minutes  Visit consisted of counseling and education dealing  with the complex and emotionally intense issues of symptom management and palliative care in the setting of serious and potentially life-threatening illness.Greater than 50%  of this time was spent counseling and coordinating care related to the above assessment and plan.  Signed by: Gerilyn Kobus, PhD, NP-C

## 2023-12-17 NOTE — Progress Notes (Signed)
 Hematology/Oncology Progress note [phone] [phone]        REFERRING PROVIDER: Antonio Baumgarten, MD    CHIEF COMPLAINTS/PURPOSE OF CONSULTATION:  Metastatic squamous cell carcinoma  ASSESSMENT & PLAN:   Elevated alkaline phosphatase level Likely due to liver metastasis.    Goals of care, counseling/discussion Discussed with patient. He understands that condition is not curable and treatment is with palliative intent.   Metastasis to brain (HCC) Continue Dexamethasone  4mg  BID.  Refer to radiation oncologist.   Squamous cell carcinoma lung, left [facility]) Imaging results and pathology results were reviewed and discussed with patient. 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 I recommend to send off NGS plus PD-L1. Recommend palliative chemotherapy with carboplatin, Taxol, Keytruda.  Patient understands that treatment is with palliative intent.  Given that he will start brain radiation for treatment of brain metastasis, I will hold off Keytruda for now.  Plan carboplatin/Taxol next week. Rationale and potential side effects of chemotherapy and immunotherapy were discussed in details with patient.  He is in agreement. Mediport option was discussed.  Patient prefers to utilize peripheral vein and if in the future it is difficult to obtain IV access, he will consider Mediport placement. Will arrange patient to get chemotherapy class, anticipate to start treatment next week.   Orders Placed This Encounter  Procedures   CBC with Differential (Cancer Center Only)    Standing Status:   Future    Expected Date:   12/23/2023    Expiration Date:   12/22/2024   CMP (Cancer Center only)    Standing Status:   Future    Expected Date:   12/23/2023    Expiration Date:   12/22/2024   Been 1 week.  Treatments. All questions were answered. The patient knows to call the clinic with any  problems, questions or concerns.  [name], MD, PhD [facility] Hematology Oncology 12/17/2023    HISTORY OF PRESENTING ILLNESS:  Tony Mitchell 83 y.o. male presents to establish care for liver masses  Oncology History  Squamous cell carcinoma lung, left (HCC)  11/22/2023 Imaging   CT abdomen pelvis w contrast  1. Multiple hepatic metastatic lesions. Correlation with history of known malignancy recommended. 2. Serosal implant along the distal stomach versus gastrohepatic adenopathy. 3. Thickened appearance of the gastric antrum and pylorus likely related to underdistention. An infiltrative mass is less likely but not excluded. Endoscopy may provide better evaluation. 4. Herniation of a segment of the sigmoid colon into the left inguinal canal. No bowel obstruction.     12/04/2023 Imaging   PET scan showed  Extensive hypermetabolic neoplastic disease including left lower lobe infiltrative mass extending from the pleura to the hilum with numerous abnormal left-sided hilar and mediastinal nodes.   Additional upper retroperitoneal nodes as well as extensive liver metastases.   Multifocal osseous metastatic disease including the thoracic and lumbar spine, pelvis, right humeral head and left scapula.   Based on the overall appearance and with the patient's laboratory a abnormality this very well could be a primary left lower lobe lung lesion such as small cell versus a neuroendocrine tumor with diffuse metastatic disease.   12/10/2023 Imaging   MRI brain w wo contrast  1. 10 x 9 x 10 mm rounded lesion in the posterior left frontal lobe with surrounding vasogenic edema is consistent with metastatic disease. 2. No other pathologic enhancement is present. 3. Periventricular and scattered subcortical T2 hyperintensities  bilaterally are mildly advanced for age. The finding is nonspecific but can be seen in the setting of chronic microvascular ischemia, a demyelinating process  such as multiple sclerosis, vasculitis, complicated migraine headaches, or as the sequelae of a prior infectious or inflammatory process.   12/17/2023 Initial Diagnosis   Squamous cell carcinoma lung, left (HCC)  He began experiencing abdominal pain for a week.  Initially presenting as increased gas and stomach growling despite having eaten. The pain was localized between the rib cage and the abdomen and described as intermittent.  Later the pain had resolved, but he continues to feel bloated. He has CT abdomen pelvis done which showed liver masses.   Liver biopsy showed  1. Liver, biopsy, right hepatic lesion :      - METASTATIC POORLY DIFFERENTIATED SQUAMOUS CELL CARCINOMA WITH NECROSIS.  Diagnosis Note : Per [facility] a recent PET scan revealed a hypermetabolic left lower lobe lung mass with extensive metastatic lesions. The carcinoma is positive for cytokeratin 7, p40 (greater than 50%), and CD56 (subset). Cytokeratin 20, TTF-1,  chromogranin, synaptophysin, and CDX-2 are negative. In the absence of  chromogranin / synaptophysin staining the presence of patchy CD56 staining is interpreted as non-specific. The pattern of immunoreactivity is consistent with metastatic poorly differentiated squamous cell carcinoma.     12/17/2023 Cancer Staging   Staging form: Lung, AJCC V9 - Clinical stage from 12/17/2023: Tony Mitchell, pM1c - Signed by [name], MD on 12/17/2023 Histopathologic type: Squamous cell carcinoma, NOS Stage prefix: Initial diagnosis Laterality: Left Sites of metastasis: Bone, Liver, Brain   12/23/2023 -  Chemotherapy   Patient is on Treatment Plan : LUNG Carboplatin + Paclitaxel q21d       Patient denies changes in bowel habits.  Denies unintentional weight loss night sweats or fever. No history of blood transfusions or hepatitis. He is not on any blood thinners but takes aspirin  for preventive purposes.  He has a history of stage 3 renal disease diagnosed three years ago after a blood  test revealed elevated potassium levels. Since then, he has made significant lifestyle changes, including stopping alcohol consumption and losing weight. He now maintains a weight between 145 and 151 pounds and walks approximately 5.6 miles daily.  He has a history of skin cancer, treated with resection, and is scheduled for further treatment. His father had prostate cancer.  Patient had a normal PSA in January 2025.   Patient lives at home with wife who has multiple medical problems.  He is the caregiver for her. His daughter passed away last year.  He has granddaughter who lives in [place] .  He denies SOB, chest pain, hemoptysis.  Former smoker.  Abdominal pain has improved and he did not need to take any pain meds.     MEDICAL HISTORY:  Past Medical History:  Diagnosis Date   Acute appendicitis 03/03/2018   Arthritis    Cancer (HCC)    skin cancer   Chronic kidney disease, stage 3b (HCC)    Collapsed lung 2019   x2   COPD (chronic obstructive pulmonary disease) (HCC)    Coronary artery disease    Diabetes mellitus without complication (HCC)    diet controlled-lost 60 lbs and is not on any current meds   GERD (gastroesophageal reflux disease)    Gout    Hyperlipidemia    Hypertension    Sleep apnea    uses cpap   Thoracic aortic aneurysm (HCC)    Vitamin B 12 deficiency  SURGICAL HISTORY: Past Surgical History:  Procedure Laterality Date   CARDIAC CATHETERIZATION     CATARACT EXTRACTION  2012   CHEST TUBE INSERTION     x2   COLONOSCOPY WITH PROPOFOL  N/A 04/14/2021   Procedure: COLONOSCOPY WITH PROPOFOL ;  Surgeon: Shane Darling, MD;  Location: [facility] ENDOSCOPY;  Service: Endoscopy;  Laterality: N/A;   ESOPHAGOGASTRODUODENOSCOPY (EGD) WITH PROPOFOL  N/A 04/14/2021   Procedure: ESOPHAGOGASTRODUODENOSCOPY (EGD) WITH PROPOFOL ;  Surgeon: Shane Darling, MD;  Location: [facility] ENDOSCOPY;  Service: Endoscopy;  Laterality: N/A;   INSERTION OF MESH  09/22/2021   Procedure:  INSERTION OF MESH;  Surgeon: Conrado Delay, DO;  Location: [facility] ORS;  Service: General;;   JOINT REPLACEMENT     KNEE SURGERY Left 1965   LAPAROSCOPIC APPENDECTOMY N/A 03/03/2018   Procedure: APPENDECTOMY LAPAROSCOPIC;  Surgeon: Franki Isles, MD;  Location: [facility] ORS;  Service: General;  Laterality: N/A;   TOTAL KNEE ARTHROPLASTY Left 09/16/2018   Procedure: TOTAL KNEE ARTHROPLASTY;  Surgeon: Elner Hahn, MD;  Location: [facility] ORS;  Service: Orthopedics;  Laterality: Left;    SOCIAL HISTORY: Social History   Socioeconomic History   Marital status: Married    Spouse name: Not on file   Number of children: Not on file   Years of education: Not on file   Highest education level: Not on file  Occupational History   Not on file  Tobacco Use   Smoking status: Former    Current packs/day: 0.00    Average packs/day: 1 pack/day for 40.0 years (40.0 ttl pk-yrs)    Types: Cigarettes    Start date: 06/15/1956    Quit date: 06/15/1996    Years since quitting: 27.5    Passive exposure: Past   Smokeless tobacco: Never  Vaping Use   Vaping status: Never Used  Substance and Sexual Activity   Alcohol use: Not Currently    Alcohol/week: 21.0 standard drinks of alcohol    Types: 21 Shots of liquor per week    Comment: reports last drink 11/03/20   Drug use: No   Sexual activity: Yes  Other Topics Concern   Not on file  Social History Narrative   Not on file   Social Drivers of Health   Financial Resource Strain: Low Risk  (10/30/2023)   Received from [facility]   Overall Financial Resource Strain (CARDIA)    Difficulty of Paying Living Expenses: Not hard at all  Food Insecurity: No Food Insecurity (10/30/2023)   Received from [facility]   Hunger Vital Sign    Worried About Running Out of Food in the Last Year: Never true    Ran Out of Food in the Last Year: Never true  Transportation Needs: No Transportation Needs (10/30/2023)   Received from  [facility] - Transportation    In the past 12 months, has lack of transportation kept you from medical appointments or from getting medications?: No    Lack of Transportation (Non-Medical): No  Physical Activity: Sufficiently Active (07/10/2017)   Received from [facility], [facility]   Exercise Vital Sign    Days of Exercise per Week: 5 days    Minutes of Exercise per Session: 60 min  Stress: Stress Concern Present (07/10/2017)   Received from [facility], [facility]   [employer] of Occupational Health - Occupational Stress Questionnaire    Feeling of Stress : Rather much  Social Connections: Unknown (07/10/2017)   Received from [facility], [facility]   Social Connection and Isolation Panel [NHANES]    Frequency of Communication with Friends and Family: Patient declined    Frequency of Social Gatherings with Friends and Family: Patient declined    Attends Religious Services: Patient declined    Database administrator or Organizations: Patient declined    Attends Engineer, structural: Patient declined    Marital Status: Patient declined  Catering manager Violence: Not on file    FAMILY HISTORY: Family History  Problem Relation Age of Onset   Cancer Father        Unknown   Prostate cancer Father    Lung cancer Neg Hx     ALLERGIES:  has no known allergies.  MEDICATIONS:  Current Outpatient Medications  Medication Sig Dispense Refill   allopurinol  (ZYLOPRIM ) 100 MG tablet Take 100 mg by mouth in the morning and at bedtime.     ALPRAZolam  (XANAX ) 0.5 MG tablet Take 0.5 mg by mouth at bedtime.     amLODipine  (NORVASC ) 2.5 MG tablet Take 2.5 mg by mouth in the morning.     aspirin  EC 81 MG tablet Take 81 mg by mouth in the morning.     dexamethasone  (DECADRON ) 4 MG tablet Take 1 tablet (4 mg total) by mouth 2 (two) times  daily. 30 tablet 0   fenofibrate  (TRICOR ) 145 MG tablet Take 145 mg by mouth 2 (two) times a week. On Wednesday and Sunday     finasteride  (PROSCAR ) 5 MG tablet Take 1 tablet (5 mg total) by mouth daily. 90 tablet 3   gabapentin  (NEURONTIN ) 100 MG capsule Take 100 mg by mouth.     hydrALAZINE  (APRESOLINE ) 25 MG tablet Take 25 mg by mouth 2 (two) times daily.     lansoprazole (PREVACID) 15 MG capsule Take 15 mg by mouth every evening.     oxyCODONE  (OXY IR/ROXICODONE ) 5 MG immediate release tablet Take 1 tablet (5 mg total) by mouth every 6 (six) hours as needed for severe pain (pain score 7-10) or breakthrough pain. 10 tablet 0   simvastatin  (ZOCOR ) 20 MG tablet Take 1 tablet by mouth 2 (two) times a week. On  Wednesday and Sunday     tamsulosin  (FLOMAX ) 0.4 MG CAPS capsule Take 1 capsule (0.4 mg total) by mouth daily. 30 capsule 11   vitamin B-12 (CYANOCOBALAMIN ) 1000 MCG tablet Take 1,000 mcg by mouth every morning.     Vitamin D, Ergocalciferol, (DRISDOL) 1.25 MG (50000 UNIT) CAPS capsule Take 50,000 Units by mouth every 7 (seven) days.     colchicine 0.6 MG tablet Take 2 tablets (1.2mg ) by mouth at first sign of gout flare followed by 1 tablet (0.6mg ) after 1 hour. (Max 1.8mg  within 1 hour) (Patient not taking: Reported on 12/12/2023)     triamcinolone  ointment (KENALOG ) 0.5 % Apply 1 Application topically 2 (two) times daily. (Patient not taking: Reported on 12/12/2023) 30 g 0   No current facility-administered medications for this visit.    Review of Systems  Constitutional:  Negative for appetite change, chills, fatigue, fever and unexpected weight change.  HENT:   Negative for hearing loss and voice change.   Eyes:  Negative for eye problems and icterus.  Respiratory:  Negative for chest tightness, cough and shortness of breath.   Cardiovascular:  Negative for chest pain and leg swelling.  Gastrointestinal:  Negative for abdominal distention and abdominal pain.  Bloating   Endocrine: Negative for hot flashes.  Genitourinary:  Negative for difficulty urinating, dysuria and frequency.   Musculoskeletal:  Negative for arthralgias.  Skin:  Negative for itching and rash.  Neurological:  Negative for light-headedness and numbness.  Hematological:  Negative for adenopathy. Does not bruise/bleed easily.  Psychiatric/Behavioral:  Negative for confusion.      PHYSICAL EXAMINATION: ECOG PERFORMANCE STATUS: 1 - Symptomatic but completely ambulatory  Vitals:   12/17/23 0843  BP: (!) 167/66  Pulse: 94  Resp: 20  Temp: 98.7 F (37.1 C)  SpO2: 100%   Filed Weights   12/17/23 0843  Weight: 148 lb 3.2 oz (67.2 kg)    Physical Exam Constitutional:      General: He is not in acute distress.    Appearance: He is not diaphoretic.  HENT:     Head: Normocephalic and atraumatic.     Nose: Nose normal.  Eyes:     General: No scleral icterus. Cardiovascular:     Rate and Rhythm: Normal rate and regular rhythm.     Heart sounds: No murmur heard. Pulmonary:     Effort: Pulmonary effort is normal. No respiratory distress.  Abdominal:     General: There is no distension.     Palpations: Abdomen is soft. There is mass.     Tenderness: There is no abdominal tenderness.  Musculoskeletal:        General: Normal range of motion.     Cervical back: Normal range of motion and neck supple.  Skin:    General: Skin is warm and dry.     Findings: No erythema.  Neurological:     Mental Status: He is alert and oriented to person, place, and time. Mental status is at baseline.     Cranial Nerves: No cranial nerve deficit.     Motor: No abnormal muscle tone.     Coordination: Coordination normal.  Psychiatric:        Mood and Affect: Mood and affect normal.      LABORATORY DATA:  I have reviewed the data as listed    Latest Ref Rng & Units 12/12/2023   10:15 AM 11/20/2020    1:35 PM 11/06/2020    4:08 AM  CBC  WBC 4.0 - 10.5 K/uL 6.0  6.3  6.2   Hemoglobin 13.0 -  17.0 g/dL 40.9  81.1  9.5   Hematocrit 39.0 - 52.0 % 39.9  33.8  27.7   Platelets 150 - 400 K/uL 289  356  225       Latest Ref Rng & Units 12/12/2023   10:15 AM 11/27/2023    4:17 PM 03/09/2021    9:44 AM  CMP  Glucose 70 - 99 mg/dL 96  914    BUN 8 - 23 mg/dL 24  16    Creatinine 7.82 - 1.24 mg/dL 9.56  2.13  0.86   Sodium 135 - 145 mmol/L 139  136    Potassium 3.5 - 5.1 mmol/L 3.8  4.0    Chloride 98 - 111 mmol/L 105  99    CO2 22 - 32 mmol/L 27  29    Calcium 8.9 - 10.3 mg/dL 9.2  9.2    Total Protein 6.5 - 8.1 g/dL  7.0    Total Bilirubin 0.0 - 1.2 mg/dL  0.8    Alkaline Phos 38 - 126 U/L  150    AST 15 - 41 U/L  79    ALT 0 -  44 U/L  31       RADIOGRAPHIC STUDIES: I have personally reviewed the radiological images as listed and agreed with the findings in the report. US  BIOPSY (LIVER) Result Date: 12/12/2023 INDICATION: 83 year old with concern for metastatic disease. Patient has numerous liver lesions and needs a tissue diagnosis. EXAM: ULTRASOUND-GUIDED LIVER LESION BIOPSY MEDICATIONS: Moderate sedation ANESTHESIA/SEDATION: Moderate (conscious) sedation was employed during this procedure. A total of Versed  1 mg and Fentanyl  50 mcg was administered intravenously by the radiology nurse. Total intra-service moderate Sedation Time: 18 minutes. The patient's level of consciousness and vital signs were monitored continuously by radiology nursing throughout the procedure under my direct supervision. FLUOROSCOPY TIME:  None COMPLICATIONS: None immediate. PROCEDURE: Informed written consent was obtained from the patient after a thorough discussion of the procedural risks, benefits and alternatives. All questions were addressed. Maximal Sterile Barrier Technique was utilized including caps, mask, sterile gowns, sterile gloves, sterile drape, hand hygiene and skin antiseptic. A timeout was performed prior to the initiation of the procedure. Liver was evaluated with ultrasound. Numerous hepatic  lesions were identified. The right anterior abdomen was prepped with chlorhexidine  and sterile field was created. Skin was anesthetized using 1% lidocaine . Small incision was made. Using ultrasound guidance, 17 gauge coaxial needle was directed into a right hepatic lesion. Total of 5 core biopsies were performed with an 18 gauge core device. Adequate specimens obtained and placed in formalin. Gel-Foam slurry was injected as the 17 gauge needle was removed. Bandage placed over the puncture site. FINDINGS: Innumerable hepatic lesions. Many of hepatic lesions are partially cystic and necrotic. A hyperechoic lesion that appeared to be mostly solid in the right hepatic lobe was targeted. Biopsy needle confirmed within the lesion. No immediate bleeding or hematoma formation after the biopsy. IMPRESSION: Ultrasound-guided core biopsy of a right hepatic lesion. Electronically Signed   By: Elene Griffes M.D.   On: 12/12/2023 13:33   MR Brain W Wo Contrast Result Date: 12/12/2023 CLINICAL DATA:  Liver lesion.  Diffuse metastatic disease. EXAM: MRI HEAD WITHOUT AND WITH CONTRAST TECHNIQUE: Multiplanar, multiecho pulse sequences of the brain and surrounding structures were obtained without and with intravenous contrast. CONTRAST:  7mL GADAVIST  GADOBUTROL  1 MMOL/ML IV SOLN COMPARISON:  None Available. FINDINGS: Brain: A rounded lesion in the posterior left frontal lobe measures 10 x 9 x 10 mm on image 132 of series 16. Surrounding vasogenic edema is present. No other pathologic enhancement is present. No acute infarct or hemorrhage is present. Periventricular and scattered subcortical T2 hyperintensities are mildly advanced for age. Deep brain nuclei are within normal limits. The ventricles are of normal size. No significant extraaxial fluid collection is present. The brainstem and cerebellum are within normal limits. The internal auditory canals are within normal limits. Midline structures are within normal limits. Vascular:  Insert normal flow Skull and upper cervical spine: The craniocervical junction is normal. Upper cervical spine is within normal limits. Marrow signal is unremarkable. Sinuses/Orbits: The paranasal sinuses and mastoid air cells are clear. Bilateral lens replacements are noted. Globes and orbits are otherwise unremarkable. IMPRESSION: 1. 10 x 9 x 10 mm rounded lesion in the posterior left frontal lobe with surrounding vasogenic edema is consistent with metastatic disease. 2. No other pathologic enhancement is present. 3. Periventricular and scattered subcortical T2 hyperintensities bilaterally are mildly advanced for age. The finding is nonspecific but can be seen in the setting of chronic microvascular ischemia, a demyelinating process such as multiple sclerosis, vasculitis, complicated migraine headaches, or as  the sequelae of a prior infectious or inflammatory process. Electronically Signed   By: Audree Leas M.D.   On: 12/12/2023 13:32   NM PET Image Initial (PI) Skull Base To Thigh Result Date: 12/04/2023 CLINICAL DATA:  Initial treatment strategy for metastatic disease. Elevated tumor marker chromogranin A. EXAM: NUCLEAR MEDICINE PET SKULL BASE TO THIGH TECHNIQUE: 7.97 mCi F-18 FDG was injected intravenously. Full-ring PET imaging was performed from the skull base to thigh after the radiotracer. CT data was obtained and used for attenuation correction and anatomic localization. Fasting blood glucose: 75 mg/dl COMPARISON:  Abdomen pelvis CT 11/22/2023 and older. Chest x-ray 11/13/2022. FINDINGS: Mediastinal blood pool activity: SUV max 2.0 NECK: No specific abnormal radiotracer uptake seen in the neck including along lymph node change of the submandibular, posterior triangle or internal jugular region. There is symmetric uptake seen of the visualized intracranial compartment. Incidental CT findings: Visualized portions of the paranasal sinuses and mastoid air cells are clear. Vascular calcifications  are seen. The parotid glands, submandibular glands and thyroid gland are unremarkable. CHEST: There is hypermetabolic infiltrative appearing mass in the superior segment of the left lower lobe with maximum SUV value of 19.1. There are some spiculations identified. The tumor extends from the hilum of the left lower lobe out to the pleura laterally. Lesion on series 6, image 61 measures 3.7 by 2.6 and appearance. This not include the component extending more medial to the hilum. There is confluent abnormal uptake in tissue involving the hilum itself, likely abnormal nodes. Abnormal nodes are also seen extending left paratracheal. The hilar area has maximum SUV approaching 11.5. The left paratracheal lesion has maximum SUV value of 12.7 and dimension on CT image 53 of 3.4 by 2.4 cm. Additional left paratracheal nodes extending more superiorly. The most superior hypermetabolic node is identified just deep to the left subclavian artery on CT image 44 measuring 6 mm in short axis but has maximum SUV value of 8.6. Additional caudal abnormal node subcarinal on the left. No specific hypermetabolic nodes identified in the axillary regions or right hilum. On the PET image there is an area of uptake along the right extreme lung base. There is no nodule in this location. This could be related to misregistration with a dome liver lesion. Incidental CT findings: Mild linear opacity at the lung bases likely scar or atelectasis. No pleural effusion or pneumothorax. Prominent coronary artery calcifications are seen. No pericardial effusion. Diffuse vascular calcifications along the aorta. 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 At least 20 lesions are identified. There are several areas which are confluent in 2 larger measurable lesions. Specific examples would include lesion left hepatic lobe on the previous examination measuring 4.3 x 3.8 cm. This is seen posteriorly along segment 3 and today has  maximum SUV value of 15.9. The confluent area of multiple lesions along the inferior right hepatic lobe that measured on the prior contrast CT 89.3 x 5.1 cm, today would have an overall maximum SUV value of 18.2. No other specific areas of abnormal uptake identified along the parenchymal organs. No abnormal uptake along bowel or renal collecting systems. There is only trace uptake along the left adrenal gland, nonspecific. There are some abnormal hypermetabolic upper abdominal nodes. Dominant focus is seen portacaval with maximum SUV 18.3 and this lesion on series 6, image 93 measures 3.1 by 1.9 cm. Small area of abnormal uptake maximum SUV value 3.0 along a left para-node in the upper retroperitoneum measuring 7 mm on image 90.  This is medial to the left adrenal gland just superior. Additional Incidental CT findings: Diffuse vascular calcifications are identified. Mild renal atrophy. No obstructing renal or ureteral stones. The bowel is nondilated with diffuse colonic stool. There is a left inguinal hernia involving portions of the sigmoid colon without obstruction or dilatation. Slight wall thickening of the non dilated underdistended stomach. The prior CT report describes an area of thickening along the antrum of the stomach. This is unchanged in appearance by CT but there is no abnormal uptake. This could be simply underdistended. SKELETON: There is multifocal hypermetabolic osseous metastatic disease. These include along the spine at L5, L4, L3 but also largest have the vertebral body of T5 with maximum SUV value of 14.9. There are foci seen along the right humeral head, left scapula, right iliac crest, left iliac crest and both upper lateral portions of the sacrum. The right iliac crest lesion anteriorly has some mild mixed lucent and sclerotic foci on CT and has maximum SUV value of 13.7. Incidental CT findings: Scattered degenerative changes seen of the spine and pelvis. Osteopenia. IMPRESSION: Extensive  hypermetabolic neoplastic disease including left lower lobe infiltrative mass extending from the pleura to the hilum with numerous abnormal left-sided hilar and mediastinal nodes. Additional upper retroperitoneal nodes as well as extensive liver metastases. Multifocal osseous metastatic disease including the thoracic and lumbar spine, pelvis, right humeral head and left scapula. Based on the overall appearance and with the patient's laboratory a abnormality this very well could be a primary left lower lobe lung lesion such as small cell versus a neuroendocrine tumor with diffuse metastatic disease. Electronically Signed   By: Adrianna Horde M.D.   On: 12/04/2023 20:30   CT ABDOMEN PELVIS W CONTRAST Result Date: 11/22/2023 CLINICAL DATA:  Epigastric pain. EXAM: CT ABDOMEN AND PELVIS WITH CONTRAST TECHNIQUE: Multidetector CT imaging of the abdomen and pelvis was performed using the standard protocol following bolus administration of intravenous contrast. RADIATION DOSE REDUCTION: This exam was performed according to the departmental dose-optimization program which includes automated exposure control, adjustment of the mA and/or kV according to patient size and/or use of iterative reconstruction technique. CONTRAST:  OMNIPAQUE  IOHEXOL  300 MG/ML  SOLN COMPARISON:  CT abdomen pelvis dated 03/09/2021. FINDINGS: Lower chest: The visualized lung bases are clear. No intra-abdominal free air or free fluid. Hepatobiliary: Multiple hypoenhancing liver lesions consistent with metastatic disease. A representative lesion in the left lobe of the liver measures 4.3 x 3.8 cm. A cluster of lesions in the right lobe of the liver measure approximately 9.3 x 5.1 cm. No biliary dilatation. The gallbladder is unremarkable. Pancreas: Unremarkable. No pancreatic ductal dilatation or surrounding inflammatory changes. Spleen: Normal in size without focal abnormality. Adrenals/Urinary Tract: The adrenal glands are unremarkable. There is  no hydronephrosis on either side. There is symmetric enhancement and excretion of contrast by both kidneys. The visualized ureters and urinary bladder appear unremarkable. Stomach/Bowel: Thickened appearance of the gastric antrum and pylorus likely related to underdistention. An infiltrative mass is less likely but not excluded. Endoscopy may provide better evaluation. There is herniation of a segment of the sigmoid colon into the left inguinal canal. There is moderate stool throughout the colon. There is no bowel obstruction or active inflammation. Appendectomy. Vascular/Lymphatic: Advanced aortoiliac atherosclerotic disease. The IVC is unremarkable. No portal venous gas. Portacaval lymph node measures 16 mm short axis. Indeterminate low attenuating lesion along the superior aspect of the distal stomach measuring approximately 2 x 4 cm (coronal 22/5 and axial  20/2) represent serosal implant or adenopathy. Reproductive: The prostate gland is mildly enlarged measuring 4.3 cm in transverse axial diameter. Other: There is loss of subcutaneous fat. Musculoskeletal: Osteopenia with degenerative changes spine. No acute osseous pathology. IMPRESSION: 1. Multiple hepatic metastatic lesions. Correlation with history of known malignancy recommended. 2. Serosal implant along the distal stomach versus gastrohepatic adenopathy. 3. Thickened appearance of the gastric antrum and pylorus likely related to underdistention. An infiltrative mass is less likely but not excluded. Endoscopy may provide better evaluation. 4. Herniation of a segment of the sigmoid colon into the left inguinal canal. No bowel obstruction. Electronically Signed   By: Angus Bark M.D.   On: 11/22/2023 18:18

## 2023-12-17 NOTE — Assessment & Plan Note (Signed)
 Likely due to liver metastasis.

## 2023-12-17 NOTE — Assessment & Plan Note (Signed)
 Discussed with patient. He understands that condition is not curable and treatment is with palliative intent.

## 2023-12-17 NOTE — Assessment & Plan Note (Signed)
 Continue Dexamethasone  4mg  BID.  Refer to radiation oncologist.

## 2023-12-17 NOTE — Progress Notes (Signed)
 Met with patient during follow up visit with Dr. Wilhelmenia Harada. All questions answered during visit. Pt given resources regarding diagnosis and supportive services available. Reviewed upcoming appts. Contact info given and instructed to call with any questions or needs. Pt verbalized understanding.

## 2023-12-18 ENCOUNTER — Ambulatory Visit
Admission: RE | Admit: 2023-12-18 | Discharge: 2023-12-18 | Disposition: A | Discharge status: Home / Self Care | Source: Ambulatory Visit | Attending: Radiation Oncology | Admitting: Radiation Oncology

## 2023-12-18 ENCOUNTER — Encounter: Payer: Self-pay | Admitting: Oncology

## 2023-12-18 DIAGNOSIS — C7931 Secondary malignant neoplasm of brain: Secondary | ICD-10-CM | POA: Diagnosis present

## 2023-12-18 MED ORDER — GADOBUTROL 1 MMOL/ML IV SOLN
6.0000 mL | Freq: Once | INTRAVENOUS | Status: AC | PRN
  Administered 2023-12-18: 6 mL via INTRAVENOUS

## 2023-12-18 NOTE — Progress Notes (Signed)
 Pharmacist Chemotherapy Monitoring - Initial Assessment    Anticipated start date: 12/23/23   The following has been reviewed per standard work regarding the patient's treatment regimen: The patient's diagnosis, treatment plan and drug doses, and organ/hematologic function Lab orders and baseline tests specific to treatment regimen  The treatment plan start date, drug sequencing, and pre-medications Prior authorization status  Patient's documented medication list, including drug-drug interaction screen and prescriptions for anti-emetics and supportive care specific to the treatment regimen The drug concentrations, fluid compatibility, administration routes, and timing of the medications to be used The patient's access for treatment and lifetime cumulative dose history, if applicable  The patient's medication allergies and previous infusion related reactions, if applicable   Changes made to treatment plan:  N/A  Follow up needed:  Plan XRT to brain.  Once that is complete, will start Keytruda - will need to switch out tx plans at that time.   Cayde Held, Pharm.D., CPP 12/18/2023@3 :40 PM

## 2023-12-19 ENCOUNTER — Ambulatory Visit: Admitting: Oncology

## 2023-12-19 ENCOUNTER — Inpatient Hospital Stay: Attending: Oncology

## 2023-12-19 ENCOUNTER — Inpatient Hospital Stay

## 2023-12-19 DIAGNOSIS — Z7952 Long term (current) use of systemic steroids: Secondary | ICD-10-CM | POA: Insufficient documentation

## 2023-12-19 DIAGNOSIS — I129 Hypertensive chronic kidney disease with stage 1 through stage 4 chronic kidney disease, or unspecified chronic kidney disease: Secondary | ICD-10-CM | POA: Insufficient documentation

## 2023-12-19 DIAGNOSIS — I712 Thoracic aortic aneurysm, without rupture, unspecified: Secondary | ICD-10-CM | POA: Insufficient documentation

## 2023-12-19 DIAGNOSIS — Z79899 Other long term (current) drug therapy: Secondary | ICD-10-CM | POA: Insufficient documentation

## 2023-12-19 DIAGNOSIS — E538 Deficiency of other specified B group vitamins: Secondary | ICD-10-CM | POA: Insufficient documentation

## 2023-12-19 DIAGNOSIS — N1832 Chronic kidney disease, stage 3b: Secondary | ICD-10-CM | POA: Insufficient documentation

## 2023-12-19 DIAGNOSIS — Z87891 Personal history of nicotine dependence: Secondary | ICD-10-CM | POA: Insufficient documentation

## 2023-12-19 DIAGNOSIS — Z510 Encounter for antineoplastic radiation therapy: Secondary | ICD-10-CM | POA: Insufficient documentation

## 2023-12-19 DIAGNOSIS — Z923 Personal history of irradiation: Secondary | ICD-10-CM | POA: Insufficient documentation

## 2023-12-19 DIAGNOSIS — I2510 Atherosclerotic heart disease of native coronary artery without angina pectoris: Secondary | ICD-10-CM | POA: Insufficient documentation

## 2023-12-19 DIAGNOSIS — R634 Abnormal weight loss: Secondary | ICD-10-CM | POA: Insufficient documentation

## 2023-12-19 DIAGNOSIS — C7931 Secondary malignant neoplasm of brain: Secondary | ICD-10-CM | POA: Insufficient documentation

## 2023-12-19 DIAGNOSIS — R600 Localized edema: Secondary | ICD-10-CM | POA: Insufficient documentation

## 2023-12-19 DIAGNOSIS — Z5111 Encounter for antineoplastic chemotherapy: Secondary | ICD-10-CM | POA: Insufficient documentation

## 2023-12-19 DIAGNOSIS — J449 Chronic obstructive pulmonary disease, unspecified: Secondary | ICD-10-CM | POA: Insufficient documentation

## 2023-12-19 DIAGNOSIS — C7951 Secondary malignant neoplasm of bone: Secondary | ICD-10-CM | POA: Insufficient documentation

## 2023-12-19 DIAGNOSIS — E785 Hyperlipidemia, unspecified: Secondary | ICD-10-CM | POA: Insufficient documentation

## 2023-12-19 DIAGNOSIS — C787 Secondary malignant neoplasm of liver and intrahepatic bile duct: Secondary | ICD-10-CM | POA: Insufficient documentation

## 2023-12-19 DIAGNOSIS — I6782 Cerebral ischemia: Secondary | ICD-10-CM | POA: Insufficient documentation

## 2023-12-19 DIAGNOSIS — R748 Abnormal levels of other serum enzymes: Secondary | ICD-10-CM | POA: Insufficient documentation

## 2023-12-19 DIAGNOSIS — C3432 Malignant neoplasm of lower lobe, left bronchus or lung: Secondary | ICD-10-CM | POA: Insufficient documentation

## 2023-12-19 DIAGNOSIS — Z8042 Family history of malignant neoplasm of prostate: Secondary | ICD-10-CM | POA: Insufficient documentation

## 2023-12-19 DIAGNOSIS — G4730 Sleep apnea, unspecified: Secondary | ICD-10-CM | POA: Insufficient documentation

## 2023-12-19 DIAGNOSIS — Z85828 Personal history of other malignant neoplasm of skin: Secondary | ICD-10-CM | POA: Insufficient documentation

## 2023-12-19 DIAGNOSIS — E119 Type 2 diabetes mellitus without complications: Secondary | ICD-10-CM | POA: Insufficient documentation

## 2023-12-19 NOTE — Progress Notes (Signed)
 [facility] Clinical Social Work  Initial Assessment   Tony Mitchell is a 83 y.o. year old male presenting alone. Clinical Social Work was referred by medical provider for assessment of psychosocial needs.   SDOH (Social Determinants of Health) assessments performed: Yes   SDOH Screenings   Food Insecurity: No Food Insecurity (10/30/2023)   Received from [facility]  Housing: Low Risk  (10/30/2023)   Received from [facility]  Transportation Needs: No Transportation Needs (10/30/2023)   Received from [facility]  Utilities: Not At Risk (10/30/2023)   Received from [facility]  Depression [phone]): Low Risk  (09/24/2018)  Financial Resource Strain: Low Risk  (10/30/2023)   Received from [facility]  Physical Activity: Sufficiently Active (07/10/2017)   Received from [facility], [facility]  Social Connections: Unknown (07/10/2017)   Received from [facility], [facility]  Stress: Stress Concern Present (07/10/2017)   Received from [facility], [facility]  Tobacco Use: Medium Risk (12/17/2023)     Distress Screen completed: No    12/19/2023    1:42 PM  ONCBCN DISTRESS SCREENING  Distress experienced in past week (1-10) 0  Family Problem type Partner      Family/Social Information:  Housing Arrangement: patient lives with his wife. Family members/support persons in your life? Family Transportation concerns: No.  Patient drives himself. Employment: Retired.  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  His faith is very important to him. Advanced directives: No Services Currently in place:   Medicare  Coping/ Adjustment to diagnosis: Patient understands treatment plan and what happens next? yes Concerns about diagnosis and/or treatment: I'm not especially worried about anything Patient reported stressors: Adjusting to my illness Hopes and/or priorities: Family Patient enjoys being outside and exercise Current coping skills/ strengths: Active sense of humor , Average or above average intelligence , Capable of independent living , Communication skills , Financial means , General fund of knowledge , Motivation for treatment/growth , Religious Affiliation , and Supportive family/friends     SUMMARY: Current SDOH Barriers:  Patient's daughter and sister died last 28-Jul-2024.  Clinical Social Work Clinical Goal(s):  Demonstrate a reduction in symptoms related to :grief and adjusting to illness.   Interventions: Discussed common feeling and emotions when being diagnosed with cancer, and the importance of support during treatment Informed patient of the support team roles and support services at [facility] Provided CSW contact information and encouraged patient to call with any questions or concerns Provided patient with information about CSW role and the [facility].   Follow Up Plan: CSW will follow-up with patient by phone  Patient verbalizes understanding of plan: Yes    Kennth Peal, LCSW Clinical Social Worker [facility]

## 2023-12-20 ENCOUNTER — Other Ambulatory Visit: Payer: Self-pay | Admitting: *Deleted

## 2023-12-23 ENCOUNTER — Inpatient Hospital Stay

## 2023-12-23 ENCOUNTER — Inpatient Hospital Stay (HOSPITAL_BASED_OUTPATIENT_CLINIC_OR_DEPARTMENT_OTHER): Admitting: Oncology

## 2023-12-23 ENCOUNTER — Encounter: Payer: Self-pay | Admitting: Oncology

## 2023-12-23 ENCOUNTER — Inpatient Hospital Stay (HOSPITAL_BASED_OUTPATIENT_CLINIC_OR_DEPARTMENT_OTHER): Admitting: Hospice and Palliative Medicine

## 2023-12-23 ENCOUNTER — Encounter: Payer: Self-pay | Admitting: *Deleted

## 2023-12-23 VITALS — BP 115/68 | HR 71 | Temp 97.4°F | Resp 19

## 2023-12-23 VITALS — BP 137/55 | HR 63 | Temp 97.2°F | Resp 18 | Wt 142.3 lb

## 2023-12-23 DIAGNOSIS — C787 Secondary malignant neoplasm of liver and intrahepatic bile duct: Secondary | ICD-10-CM | POA: Diagnosis not present

## 2023-12-23 DIAGNOSIS — Z85828 Personal history of other malignant neoplasm of skin: Secondary | ICD-10-CM | POA: Diagnosis not present

## 2023-12-23 DIAGNOSIS — C3492 Malignant neoplasm of unspecified part of left bronchus or lung: Secondary | ICD-10-CM

## 2023-12-23 DIAGNOSIS — N1832 Chronic kidney disease, stage 3b: Secondary | ICD-10-CM | POA: Diagnosis not present

## 2023-12-23 DIAGNOSIS — I712 Thoracic aortic aneurysm, without rupture, unspecified: Secondary | ICD-10-CM | POA: Diagnosis not present

## 2023-12-23 DIAGNOSIS — R748 Abnormal levels of other serum enzymes: Secondary | ICD-10-CM | POA: Diagnosis not present

## 2023-12-23 DIAGNOSIS — E119 Type 2 diabetes mellitus without complications: Secondary | ICD-10-CM | POA: Diagnosis not present

## 2023-12-23 DIAGNOSIS — R634 Abnormal weight loss: Secondary | ICD-10-CM | POA: Diagnosis not present

## 2023-12-23 DIAGNOSIS — Z515 Encounter for palliative care: Secondary | ICD-10-CM | POA: Diagnosis not present

## 2023-12-23 DIAGNOSIS — I2510 Atherosclerotic heart disease of native coronary artery without angina pectoris: Secondary | ICD-10-CM | POA: Diagnosis not present

## 2023-12-23 DIAGNOSIS — C7931 Secondary malignant neoplasm of brain: Secondary | ICD-10-CM | POA: Diagnosis not present

## 2023-12-23 DIAGNOSIS — Z5111 Encounter for antineoplastic chemotherapy: Secondary | ICD-10-CM | POA: Diagnosis present

## 2023-12-23 DIAGNOSIS — I129 Hypertensive chronic kidney disease with stage 1 through stage 4 chronic kidney disease, or unspecified chronic kidney disease: Secondary | ICD-10-CM | POA: Diagnosis not present

## 2023-12-23 DIAGNOSIS — Z923 Personal history of irradiation: Secondary | ICD-10-CM | POA: Diagnosis not present

## 2023-12-23 DIAGNOSIS — J449 Chronic obstructive pulmonary disease, unspecified: Secondary | ICD-10-CM | POA: Diagnosis not present

## 2023-12-23 DIAGNOSIS — Z79899 Other long term (current) drug therapy: Secondary | ICD-10-CM | POA: Diagnosis not present

## 2023-12-23 DIAGNOSIS — E785 Hyperlipidemia, unspecified: Secondary | ICD-10-CM | POA: Diagnosis not present

## 2023-12-23 DIAGNOSIS — Z510 Encounter for antineoplastic radiation therapy: Secondary | ICD-10-CM | POA: Diagnosis not present

## 2023-12-23 DIAGNOSIS — E538 Deficiency of other specified B group vitamins: Secondary | ICD-10-CM | POA: Diagnosis not present

## 2023-12-23 DIAGNOSIS — G4730 Sleep apnea, unspecified: Secondary | ICD-10-CM | POA: Diagnosis not present

## 2023-12-23 DIAGNOSIS — Z7952 Long term (current) use of systemic steroids: Secondary | ICD-10-CM | POA: Diagnosis not present

## 2023-12-23 DIAGNOSIS — Z87891 Personal history of nicotine dependence: Secondary | ICD-10-CM | POA: Diagnosis not present

## 2023-12-23 DIAGNOSIS — C7951 Secondary malignant neoplasm of bone: Secondary | ICD-10-CM | POA: Diagnosis not present

## 2023-12-23 DIAGNOSIS — Z8042 Family history of malignant neoplasm of prostate: Secondary | ICD-10-CM | POA: Diagnosis not present

## 2023-12-23 DIAGNOSIS — R600 Localized edema: Secondary | ICD-10-CM | POA: Diagnosis not present

## 2023-12-23 DIAGNOSIS — C3432 Malignant neoplasm of lower lobe, left bronchus or lung: Secondary | ICD-10-CM | POA: Diagnosis not present

## 2023-12-23 LAB — CMP (CANCER CENTER ONLY)
ALT: 54 U/L — ABNORMAL HIGH (ref 0–44)
AST: 119 U/L — ABNORMAL HIGH (ref 15–41)
Albumin: 3.9 g/dL (ref 3.5–5.0)
Alkaline Phosphatase: 353 U/L — ABNORMAL HIGH (ref 38–126)
Anion gap: 11 (ref 5–15)
BUN: 40 mg/dL — ABNORMAL HIGH (ref 8–23)
CO2: 24 mmol/L (ref 22–32)
Calcium: 8.6 mg/dL — ABNORMAL LOW (ref 8.9–10.3)
Chloride: 97 mmol/L — ABNORMAL LOW (ref 98–111)
Creatinine: 1.15 mg/dL (ref 0.61–1.24)
GFR, Estimated: >60 mL/min (ref >60)
Glucose, Bld: 162 mg/dL — ABNORMAL HIGH (ref 70–99)
Potassium: 4.4 mmol/L (ref 3.5–5.1)
Sodium: 132 mmol/L — ABNORMAL LOW (ref 135–145)
Total Bilirubin: 1 mg/dL (ref 0.0–1.2)
Total Protein: 6.2 g/dL — ABNORMAL LOW (ref 6.5–8.1)

## 2023-12-23 LAB — CBC WITH DIFFERENTIAL (CANCER CENTER ONLY)
Abs Immature Granulocytes: 0.06 10*3/uL (ref 0.00–0.07)
Basophils Absolute: 0 10*3/uL (ref 0.0–0.1)
Basophils Relative: 0 %
Eosinophils Absolute: 0 10*3/uL (ref 0.0–0.5)
Eosinophils Relative: 0 %
HCT: 42.3 % (ref 39.0–52.0)
Hemoglobin: 14.2 g/dL (ref 13.0–17.0)
Immature Granulocytes: 1 %
Lymphocytes Relative: 8 %
Lymphs Abs: 0.8 10*3/uL (ref 0.7–4.0)
MCH: 29.6 pg (ref 26.0–34.0)
MCHC: 33.6 g/dL (ref 30.0–36.0)
MCV: 88.1 fL (ref 80.0–100.0)
Monocytes Absolute: 0.8 10*3/uL (ref 0.1–1.0)
Monocytes Relative: 8 %
Neutro Abs: 8.7 10*3/uL — ABNORMAL HIGH (ref 1.7–7.7)
Neutrophils Relative %: 83 %
Platelet Count: 278 10*3/uL (ref 150–400)
RBC: 4.8 MIL/uL (ref 4.22–5.81)
RDW: 13.2 % (ref 11.5–15.5)
WBC Count: 10.4 10*3/uL (ref 4.0–10.5)
nRBC: 0 % (ref 0.0–0.2)

## 2023-12-23 MED ORDER — SODIUM CHLORIDE 0.9 % IV SOLN
150.0000 mg/m2 | Freq: Once | INTRAVENOUS | Status: AC
  Administered 2023-12-23: 276 mg via INTRAVENOUS
  Filled 2023-12-23: qty 46

## 2023-12-23 MED ORDER — FAMOTIDINE IN NACL 20-0.9 MG/50ML-% IV SOLN
20.0000 mg | Freq: Once | INTRAVENOUS | Status: AC
Start: 2023-12-23 — End: 2023-12-23
  Administered 2023-12-23: 20 mg via INTRAVENOUS
  Filled 2023-12-23: qty 50

## 2023-12-23 MED ORDER — DIPHENHYDRAMINE HCL 50 MG/ML IJ SOLN
50.0000 mg | Freq: Once | INTRAMUSCULAR | Status: AC
  Administered 2023-12-23: 50 mg via INTRAVENOUS
  Filled 2023-12-23: qty 1

## 2023-12-23 MED ORDER — APREPITANT 130 MG/18ML IV EMUL
130.0000 mg | Freq: Once | INTRAVENOUS | Status: AC
  Administered 2023-12-23: 130 mg via INTRAVENOUS
  Filled 2023-12-23: qty 18

## 2023-12-23 MED ORDER — DEXAMETHASONE 4 MG PO TABS: 4.0000 mg | ORAL_TABLET | Freq: Two times a day (BID) | ORAL | 0 refills | Status: DC

## 2023-12-23 MED ORDER — DEXAMETHASONE SODIUM PHOSPHATE 10 MG/ML IJ SOLN
10.0000 mg | Freq: Once | INTRAMUSCULAR | Status: AC
  Administered 2023-12-23: 10 mg via INTRAVENOUS
  Filled 2023-12-23: qty 1

## 2023-12-23 MED ORDER — PALONOSETRON HCL INJECTION 0.25 MG/5ML
0.2500 mg | Freq: Once | INTRAVENOUS | Status: AC
  Administered 2023-12-23: 0.25 mg via INTRAVENOUS
  Filled 2023-12-23: qty 5

## 2023-12-23 MED ORDER — SODIUM CHLORIDE 0.9 % IV SOLN
INTRAVENOUS | Status: DC
  Filled 2023-12-23: qty 250

## 2023-12-23 MED ORDER — SODIUM CHLORIDE 0.9 % IV SOLN
360.5000 mg | Freq: Once | INTRAVENOUS | Status: AC
  Administered 2023-12-23: 360 mg via INTRAVENOUS
  Filled 2023-12-23: qty 36

## 2023-12-23 NOTE — Assessment & Plan Note (Addendum)
 Continue Dexamethasone  4mg  BID.  Follow up with radiation oncologist.  Refer to neurology

## 2023-12-23 NOTE — Assessment & Plan Note (Signed)
 Likely due to liver metastasis.

## 2023-12-23 NOTE — Patient Instructions (Signed)
 [facility] - A DEPT OF [facility]  Discharge Instructions: Thank you for choosing [facility] to provide your oncology and hematology care.  If you have a lab appointment with the Cancer Center, please go directly to the Cancer Center and check in at the registration area.  Wear comfortable clothing and clothing appropriate for easy access to any Portacath or PICC line.   We strive to give you quality time with your provider. You may need to reschedule your appointment if you arrive late (15 or more minutes).  Arriving late affects you and other patients whose appointments are after yours.  Also, if you miss three or more appointments without notifying the office, you may be dismissed from the clinic at the provider's discretion.      For prescription refill requests, have your pharmacy contact our office and allow 72 hours for refills to be completed.    Today you received the following chemotherapy and/or immunotherapy agents taxol and carboplatin      To help prevent nausea and vomiting after your treatment, we encourage you to take your nausea medication as directed.  BELOW ARE SYMPTOMS THAT SHOULD BE REPORTED IMMEDIATELY: *FEVER GREATER THAN 100.4 F (38 C) OR HIGHER *CHILLS OR SWEATING *NAUSEA AND VOMITING THAT IS NOT CONTROLLED WITH YOUR NAUSEA MEDICATION *UNUSUAL SHORTNESS OF BREATH *UNUSUAL BRUISING OR BLEEDING *URINARY PROBLEMS (pain or burning when urinating, or frequent urination) *BOWEL PROBLEMS (unusual diarrhea, constipation, pain near the anus) TENDERNESS IN MOUTH AND THROAT WITH OR WITHOUT PRESENCE OF ULCERS (sore throat, sores in mouth, or a toothache) UNUSUAL RASH, SWELLING OR PAIN  UNUSUAL VAGINAL DISCHARGE OR ITCHING   Items with * indicate a potential emergency and should be followed up as soon as possible or go to the Emergency Department if any problems should occur.  Please show the CHEMOTHERAPY ALERT CARD or  IMMUNOTHERAPY ALERT CARD at check-in to the Emergency Department and triage nurse.  Should you have questions after your visit or need to cancel or reschedule your appointment, please contact [facility] - A DEPT OF [facility]  [phone] and follow the prompts.  Office hours are 8:00 a.m. to 4:30 p.m. Monday - Friday. Please note that voicemails left after 4:00 p.m. may not be returned until the following business day.  We are closed weekends and major holidays. You have access to a nurse at all times for urgent questions. Please call the main number to the clinic [phone] and follow the prompts.  For any non-urgent questions, you may also contact your provider using MyChart. We now offer e-Visits for anyone 42 and older to request care online for non-urgent symptoms. For details visit [email or web address].   Also download the MyChart app! Go to the app store, search "MyChart", open the app, select [facility], and log in with your MyChart username and password.

## 2023-12-23 NOTE — Assessment & Plan Note (Addendum)
 Imaging results and pathology results were reviewed and discussed with patient. 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 I recommend to send off NGS plus PD-L1. Labs are reviewed and discussed with patient.' Proceed with cycle 1 Carboplatin + Taxol with D3 GCSF. Recommend Claritin 10mg  daily for 4 days.  Plan to add keytruda in the future.

## 2023-12-23 NOTE — Progress Notes (Signed)
 Palliative Medicine [facility] at [facility] [phone]) [phone] [phone]   Name: Tony Mitchell Date: 12/23/2023 MRN: [id number]  DOB: 1941/06/01  Patient Care Team: Antonio Baumgarten, MD as PCP - General (Internal Medicine) [name], MD as Consulting Physician (Oncology) Drake Gens, RN as Oncology Nurse Navigator    REASON FOR CONSULTATION: Tony Mitchell is a 83 y.o. 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   SOCIAL HISTORY:     reports that he quit smoking about 27 years ago. His smoking use included cigarettes. He started smoking about 67 years ago. He has a 40 pack-year smoking history. He has been exposed to tobacco smoke. He has never used smokeless tobacco. He reports that he does not currently use alcohol after a past usage of about 21.0 standard drinks of alcohol per week. He reports that he does not use drugs.  Patient is married and lives at home with his wife and two beagles.  His only daughter died in July 07, 2023.  Patient has a granddaughter in [place] .  Patient owned a pool business for 50 years.  ADVANCE DIRECTIVES:  Not on file  CODE STATUS:   PAST MEDICAL HISTORY: Past Medical History:  Diagnosis Date   Acute appendicitis 03/03/2018   Arthritis    Cancer (HCC)    skin cancer   Chronic kidney disease, stage 3b (HCC)    Collapsed lung 2019   x2   COPD (chronic obstructive pulmonary disease) (HCC)    Coronary artery disease    Diabetes mellitus without complication (HCC)    diet controlled-lost 60 lbs and is not on any current meds   GERD (gastroesophageal reflux disease)    Gout    Hyperlipidemia    Hypertension    Sleep apnea    uses cpap   Thoracic aortic aneurysm (HCC)    Vitamin B 12 deficiency     PAST SURGICAL HISTORY:  Past Surgical History:  Procedure Laterality Date   CARDIAC  CATHETERIZATION     CATARACT EXTRACTION  2012   CHEST TUBE INSERTION     x2   COLONOSCOPY WITH PROPOFOL  N/A 04/14/2021   Procedure: COLONOSCOPY WITH PROPOFOL ;  Surgeon: Shane Darling, MD;  Location: [facility] ENDOSCOPY;  Service: Endoscopy;  Laterality: N/A;   ESOPHAGOGASTRODUODENOSCOPY (EGD) WITH PROPOFOL  N/A 04/14/2021   Procedure: ESOPHAGOGASTRODUODENOSCOPY (EGD) WITH PROPOFOL ;  Surgeon: Shane Darling, MD;  Location: [facility] ENDOSCOPY;  Service: Endoscopy;  Laterality: N/A;   INSERTION OF MESH  09/22/2021   Procedure: INSERTION OF MESH;  Surgeon: Conrado Delay, DO;  Location: [facility] ORS;  Service: General;;   JOINT REPLACEMENT     KNEE SURGERY Left 1965   LAPAROSCOPIC APPENDECTOMY N/A 03/03/2018   Procedure: APPENDECTOMY LAPAROSCOPIC;  Surgeon: Franki Isles, MD;  Location: [facility] ORS;  Service: General;  Laterality: N/A;   TOTAL KNEE ARTHROPLASTY Left 09/16/2018   Procedure: TOTAL KNEE ARTHROPLASTY;  Surgeon: Elner Hahn, MD;  Location: [facility] ORS;  Service: Orthopedics;  Laterality: Left;    HEMATOLOGY/ONCOLOGY HISTORY:  Oncology History  Squamous cell carcinoma lung, left (HCC)  11/22/2023 Imaging   CT abdomen pelvis w contrast  1. Multiple hepatic metastatic lesions. Correlation with history of known malignancy recommended. 2. Serosal implant along the distal stomach versus gastrohepatic adenopathy. 3. Thickened appearance of the gastric antrum and pylorus likely related to underdistention. An infiltrative mass is less likely but not excluded.  Endoscopy may provide better evaluation. 4. Herniation of a segment of the sigmoid colon into the left inguinal canal. No bowel obstruction.     12/04/2023 Imaging   PET scan showed  Extensive hypermetabolic neoplastic disease including left lower lobe infiltrative mass extending from the pleura to the hilum with numerous abnormal left-sided hilar and mediastinal nodes.   Additional upper retroperitoneal nodes as well as extensive  liver metastases.   Multifocal osseous metastatic disease including the thoracic and lumbar spine, pelvis, right humeral head and left scapula.   Based on the overall appearance and with the patient's laboratory a abnormality this very well could be a primary left lower lobe lung lesion such as small cell versus a neuroendocrine tumor with diffuse metastatic disease.   12/10/2023 Imaging   MRI brain w wo contrast  1. 10 x 9 x 10 mm rounded lesion in the posterior left frontal lobe with surrounding vasogenic edema is consistent with metastatic disease. 2. No other pathologic enhancement is present. 3. Periventricular and scattered subcortical T2 hyperintensities bilaterally are mildly advanced for age. The finding is nonspecific but can be seen in the setting of chronic microvascular ischemia, a demyelinating process such as multiple sclerosis, vasculitis, complicated migraine headaches, or as the sequelae of a prior infectious or inflammatory process.   12/17/2023 Initial Diagnosis   Squamous cell carcinoma lung, left (HCC)  He began experiencing abdominal pain for a week.  Initially presenting as increased gas and stomach growling despite having eaten. The pain was localized between the rib cage and the abdomen and described as intermittent.  Later the pain had resolved, but he continues to feel bloated. He has CT abdomen pelvis done which showed liver masses.   Liver biopsy showed  1. Liver, biopsy, right hepatic lesion :      - METASTATIC POORLY DIFFERENTIATED SQUAMOUS CELL CARCINOMA WITH NECROSIS.  Diagnosis Note : Per [facility] a recent PET scan revealed a hypermetabolic left lower lobe lung mass with extensive metastatic lesions. The carcinoma is positive for cytokeratin 7, p40 (greater than 50%), and CD56 (subset). Cytokeratin 20, TTF-1,  chromogranin, synaptophysin, and CDX-2 are negative. In the absence of  chromogranin / synaptophysin staining the presence of patchy CD56 staining  is interpreted as non-specific. The pattern of immunoreactivity is consistent with metastatic poorly differentiated squamous cell carcinoma.     12/17/2023 Cancer Staging   Staging form: Lung, AJCC V9 - Clinical stage from 12/17/2023: [name], pM1c - Signed by [name], MD on 12/17/2023 Histopathologic type: Squamous cell carcinoma, NOS Stage prefix: Initial diagnosis Laterality: Left Sites of metastasis: Bone, Liver, Brain   12/23/2023 -  Chemotherapy   Patient is on Treatment Plan : LUNG Carboplatin + Paclitaxel q21d       ALLERGIES:  has no known allergies.  MEDICATIONS:  Current Outpatient Medications  Medication Sig Dispense Refill   allopurinol  (ZYLOPRIM ) 100 MG tablet Take 100 mg by mouth in the morning and at bedtime.     ALPRAZolam  (XANAX ) 0.5 MG tablet Take 0.5 mg by mouth at bedtime.     amLODipine  (NORVASC ) 2.5 MG tablet Take 2.5 mg by mouth in the morning.     colchicine 0.6 MG tablet Take 2 tablets (1.2mg ) by mouth at first sign of gout flare followed by 1 tablet (0.6mg ) after 1 hour. (Max 1.8mg  within 1 hour) (Patient not taking: Reported on 12/12/2023)     dexamethasone  (DECADRON ) 4 MG tablet Take 1 tablet (4 mg total) by mouth 2 (two) times daily.  30 tablet 0   fenofibrate  (TRICOR ) 145 MG tablet Take 145 mg by mouth 2 (two) times a week. On Wednesday and Sunday     finasteride  (PROSCAR ) 5 MG tablet Take 1 tablet (5 mg total) by mouth daily. 90 tablet 3   gabapentin  (NEURONTIN ) 100 MG capsule Take 100 mg by mouth.     hydrALAZINE  (APRESOLINE ) 25 MG tablet Take 25 mg by mouth 2 (two) times daily.     lansoprazole (PREVACID) 15 MG capsule Take 15 mg by mouth every evening.     ondansetron  (ZOFRAN ) 8 MG tablet Take 1 tablet (8 mg total) by mouth every 8 (eight) hours as needed for nausea or vomiting. Start on the third day after carboplatin. 30 tablet 1   oxyCODONE  (OXY IR/ROXICODONE ) 5 MG immediate release tablet Take 1 tablet (5 mg total) by mouth every 6 (six) hours as  needed for severe pain (pain score 7-10) or breakthrough pain. 10 tablet 0   prochlorperazine  (COMPAZINE ) 10 MG tablet Take 1 tablet (10 mg total) by mouth every 6 (six) hours as needed for nausea or vomiting. 30 tablet 1   simvastatin  (ZOCOR ) 20 MG tablet Take 1 tablet by mouth 2 (two) times a week. On  Wednesday and Sunday     tamsulosin  (FLOMAX ) 0.4 MG CAPS capsule Take 1 capsule (0.4 mg total) by mouth daily. 30 capsule 11   triamcinolone  ointment (KENALOG ) 0.5 % Apply 1 Application topically 2 (two) times daily. (Patient not taking: Reported on 12/12/2023) 30 g 0   vitamin B-12 (CYANOCOBALAMIN ) 1000 MCG tablet Take 1,000 mcg by mouth every morning.     Vitamin D, Ergocalciferol, (DRISDOL) 1.25 MG (50000 UNIT) CAPS capsule Take 50,000 Units by mouth every 7 (seven) days.     No current facility-administered medications for this visit.   Facility-Administered Medications Ordered in Other Visits  Medication Dose Route Frequency Provider Last Rate Last Admin   0.9 %  sodium chloride  infusion   Intravenous Continuous [name], MD 10 mL/hr at 12/23/23 0932 New Bag at 12/23/23 0932   aprepitant (CINVANTI) injection 130 mg  130 mg Intravenous Once Yu, Zhou, MD       CARBOplatin (PARAPLATIN) 360 mg in sodium chloride  0.9 % 100 mL chemo infusion  360 mg Intravenous Once [name], MD       dexamethasone  (DECADRON ) injection 10 mg  10 mg Intravenous Once Yu, Zhou, MD       PACLitaxel (TAXOL) 276 mg in sodium chloride  0.9 % 250 mL chemo infusion (> 80mg /m2)  150 mg/m2 (Treatment Plan Recorded) Intravenous Once [name], MD        VITAL SIGNS: There were no vitals taken for this visit. There were no vitals filed for this visit.  Estimated body mass index is 20.71 kg/m as calculated from the following:   Height as of 12/12/23: 5' 9.5" (1.765 m).   Weight as of an earlier encounter on 12/23/23: 142 lb 4.8 oz (64.5 kg).  LABS: CBC:    Component Value Date/Time   WBC 10.4 12/23/2023 0809   WBC 6.0  12/12/2023 1015   HGB 14.2 12/23/2023 0809   HGB 13.3 05/27/2014 1712   HCT 42.3 12/23/2023 0809   HCT 40.9 05/27/2014 1712   PLT 278 12/23/2023 0809   PLT 198 05/27/2014 1712   MCV 88.1 12/23/2023 0809   MCV 92 05/27/2014 1712   NEUTROABS 8.7 (H) 12/23/2023 0809   LYMPHSABS 0.8 12/23/2023 0809   MONOABS 0.8 12/23/2023 0809  EOSABS 0.0 12/23/2023 0809   BASOSABS 0.0 12/23/2023 0809   Comprehensive Metabolic Panel:    Component Value Date/Time   NA 132 (L) 12/23/2023 0809   NA 140 05/27/2014 1712   K 4.4 12/23/2023 0809   K 3.8 05/27/2014 1712   CL 97 (L) 12/23/2023 0809   CL 105 05/27/2014 1712   CO2 24 12/23/2023 0809   CO2 28 05/27/2014 1712   BUN 40 (H) 12/23/2023 0809   BUN 11 05/27/2014 1712   CREATININE 1.15 12/23/2023 0809   CREATININE 1.02 05/27/2014 1712   GLUCOSE 162 (H) 12/23/2023 0809   GLUCOSE 120 (H) 05/27/2014 1712   CALCIUM 8.6 (L) 12/23/2023 0809   CALCIUM 8.2 (L) 05/27/2014 1712   AST 119 (H) 12/23/2023 0809   ALT 54 (H) 12/23/2023 0809   ALKPHOS 353 (H) 12/23/2023 0809   BILITOT 1.0 12/23/2023 0809   PROT 6.2 (L) 12/23/2023 0809   ALBUMIN 3.9 12/23/2023 0809    RADIOGRAPHIC STUDIES: MR Brain W Wo Contrast Result Date: 12/18/2023 CLINICAL DATA:  Metastatic disease evaluation.  SRS planning. EXAM: MRI HEAD WITHOUT AND WITH CONTRAST TECHNIQUE: Multiplanar, multiecho pulse sequences of the brain and surrounding structures were obtained without and with intravenous contrast. CONTRAST:  6mL GADAVIST  GADOBUTROL  1 MMOL/ML IV SOLN COMPARISON:  12/10/2023 FINDINGS: Brain: Redemonstration of a 10 mm centrally necrotic metastasis at the left frontoparietal vertex with mild surrounding edema. No second lesion identified. No abnormality of the brainstem or cerebellum. Cerebral hemispheres otherwise show mild chronic small-vessel ischemic change of the white matter, less than often seen at this age. No hydrocephalus or extra-axial collection. Vascular: Major vessels  at the base of the brain show flow. Skull and upper cervical spine: Negative Sinuses/Orbits: Clear/normal Other: None IMPRESSION: Redemonstration of a 10 mm centrally necrotic metastasis at the left frontoparietal vertex with mild surrounding edema. No second lesion. Electronically Signed   By: Bettylou Brunner M.D.   On: 12/18/2023 13:45   US  BIOPSY (LIVER) Result Date: 12/12/2023 INDICATION: 83 year old with concern for metastatic disease. Patient has numerous liver lesions and needs a tissue diagnosis. EXAM: ULTRASOUND-GUIDED LIVER LESION BIOPSY MEDICATIONS: Moderate sedation ANESTHESIA/SEDATION: Moderate (conscious) sedation was employed during this procedure. A total of Versed  1 mg and Fentanyl  50 mcg was administered intravenously by the radiology nurse. Total intra-service moderate Sedation Time: 18 minutes. The patient's level of consciousness and vital signs were monitored continuously by radiology nursing throughout the procedure under my direct supervision. FLUOROSCOPY TIME:  None COMPLICATIONS: None immediate. PROCEDURE: Informed written consent was obtained from the patient after a thorough discussion of the procedural risks, benefits and alternatives. All questions were addressed. Maximal Sterile Barrier Technique was utilized including caps, mask, sterile gowns, sterile gloves, sterile drape, hand hygiene and skin antiseptic. A timeout was performed prior to the initiation of the procedure. Liver was evaluated with ultrasound. Numerous hepatic lesions were identified. The right anterior abdomen was prepped with chlorhexidine  and sterile field was created. Skin was anesthetized using 1% lidocaine . Small incision was made. Using ultrasound guidance, 17 gauge coaxial needle was directed into a right hepatic lesion. Total of 5 core biopsies were performed with an 18 gauge core device. Adequate specimens obtained and placed in formalin. Gel-Foam slurry was injected as the 17 gauge needle was removed.  Bandage placed over the puncture site. FINDINGS: Innumerable hepatic lesions. Many of hepatic lesions are partially cystic and necrotic. A hyperechoic lesion that appeared to be mostly solid in the right hepatic lobe was targeted. Biopsy needle  confirmed within the lesion. No immediate bleeding or hematoma formation after the biopsy. IMPRESSION: Ultrasound-guided core biopsy of a right hepatic lesion. Electronically Signed   By: Elene Griffes M.D.   On: 12/12/2023 13:33   MR Brain W Wo Contrast Result Date: 12/12/2023 CLINICAL DATA:  Liver lesion.  Diffuse metastatic disease. EXAM: MRI HEAD WITHOUT AND WITH CONTRAST TECHNIQUE: Multiplanar, multiecho pulse sequences of the brain and surrounding structures were obtained without and with intravenous contrast. CONTRAST:  7mL GADAVIST  GADOBUTROL  1 MMOL/ML IV SOLN COMPARISON:  None Available. FINDINGS: Brain: A rounded lesion in the posterior left frontal lobe measures 10 x 9 x 10 mm on image 132 of series 16. Surrounding vasogenic edema is present. No other pathologic enhancement is present. No acute infarct or hemorrhage is present. Periventricular and scattered subcortical T2 hyperintensities are mildly advanced for age. Deep brain nuclei are within normal limits. The ventricles are of normal size. No significant extraaxial fluid collection is present. The brainstem and cerebellum are within normal limits. The internal auditory canals are within normal limits. Midline structures are within normal limits. Vascular: Insert normal flow Skull and upper cervical spine: The craniocervical junction is normal. Upper cervical spine is within normal limits. Marrow signal is unremarkable. Sinuses/Orbits: The paranasal sinuses and mastoid air cells are clear. Bilateral lens replacements are noted. Globes and orbits are otherwise unremarkable. IMPRESSION: 1. 10 x 9 x 10 mm rounded lesion in the posterior left frontal lobe with surrounding vasogenic edema is consistent with  metastatic disease. 2. No other pathologic enhancement is present. 3. Periventricular and scattered subcortical T2 hyperintensities bilaterally are mildly advanced for age. The finding is nonspecific but can be seen in the setting of chronic microvascular ischemia, a demyelinating process such as multiple sclerosis, vasculitis, complicated migraine headaches, or as the sequelae of a prior infectious or inflammatory process. Electronically Signed   By: Audree Leas M.D.   On: 12/12/2023 13:32   NM PET Image Initial (PI) Skull Base To Thigh Result Date: 12/04/2023 CLINICAL DATA:  Initial treatment strategy for metastatic disease. Elevated tumor marker chromogranin A. EXAM: NUCLEAR MEDICINE PET SKULL BASE TO THIGH TECHNIQUE: 7.97 mCi F-18 FDG was injected intravenously. Full-ring PET imaging was performed from the skull base to thigh after the radiotracer. CT data was obtained and used for attenuation correction and anatomic localization. Fasting blood glucose: 75 mg/dl COMPARISON:  Abdomen pelvis CT 11/22/2023 and older. Chest x-ray 11/13/2022. FINDINGS: Mediastinal blood pool activity: SUV max 2.0 NECK: No specific abnormal radiotracer uptake seen in the neck including along lymph node change of the submandibular, posterior triangle or internal jugular region. There is symmetric uptake seen of the visualized intracranial compartment. Incidental CT findings: Visualized portions of the paranasal sinuses and mastoid air cells are clear. Vascular calcifications are seen. The parotid glands, submandibular glands and thyroid gland are unremarkable. CHEST: There is hypermetabolic infiltrative appearing mass in the superior segment of the left lower lobe with maximum SUV value of 19.1. There are some spiculations identified. The tumor extends from the hilum of the left lower lobe out to the pleura laterally. Lesion on series 6, image 61 measures 3.7 by 2.6 and appearance. This not include the component extending  more medial to the hilum. There is confluent abnormal uptake in tissue involving the hilum itself, likely abnormal nodes. Abnormal nodes are also seen extending left paratracheal. The hilar area has maximum SUV approaching 11.5. The left paratracheal lesion has maximum SUV value of 12.7 and dimension on CT image  53 of 3.4 by 2.4 cm. Additional left paratracheal nodes extending more superiorly. The most superior hypermetabolic node is identified just deep to the left subclavian artery on CT image 44 measuring 6 mm in short axis but has maximum SUV value of 8.6. Additional caudal abnormal node subcarinal on the left. No specific hypermetabolic nodes identified in the axillary regions or right hilum. On the PET image there is an area of uptake along the right extreme lung base. There is no nodule in this location. This could be related to misregistration with a dome liver lesion. Incidental CT findings: Mild linear opacity at the lung bases likely scar or atelectasis. No pleural effusion or pneumothorax. Prominent coronary artery calcifications are seen. No pericardial effusion. Diffuse vascular calcifications along the aorta. 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 At least 20 lesions are identified. There are several areas which are confluent in 2 larger measurable lesions. Specific examples would include lesion left hepatic lobe on the previous examination measuring 4.3 x 3.8 cm. This is seen posteriorly along segment 3 and today has maximum SUV value of 15.9. The confluent area of multiple lesions along the inferior right hepatic lobe that measured on the prior contrast CT 89.3 x 5.1 cm, today would have an overall maximum SUV value of 18.2. No other specific areas of abnormal uptake identified along the parenchymal organs. No abnormal uptake along bowel or renal collecting systems. There is only trace uptake along the left adrenal gland, nonspecific. There are some abnormal  hypermetabolic upper abdominal nodes. Dominant focus is seen portacaval with maximum SUV 18.3 and this lesion on series 6, image 93 measures 3.1 by 1.9 cm. Small area of abnormal uptake maximum SUV value 3.0 along a left para-node in the upper retroperitoneum measuring 7 mm on image 90. This is medial to the left adrenal gland just superior. Additional Incidental CT findings: Diffuse vascular calcifications are identified. Mild renal atrophy. No obstructing renal or ureteral stones. The bowel is nondilated with diffuse colonic stool. There is a left inguinal hernia involving portions of the sigmoid colon without obstruction or dilatation. Slight wall thickening of the non dilated underdistended stomach. The prior CT report describes an area of thickening along the antrum of the stomach. This is unchanged in appearance by CT but there is no abnormal uptake. This could be simply underdistended. SKELETON: There is multifocal hypermetabolic osseous metastatic disease. These include along the spine at L5, L4, L3 but also largest have the vertebral body of T5 with maximum SUV value of 14.9. There are foci seen along the right humeral head, left scapula, right iliac crest, left iliac crest and both upper lateral portions of the sacrum. The right iliac crest lesion anteriorly has some mild mixed lucent and sclerotic foci on CT and has maximum SUV value of 13.7. Incidental CT findings: Scattered degenerative changes seen of the spine and pelvis. Osteopenia. IMPRESSION: Extensive hypermetabolic neoplastic disease including left lower lobe infiltrative mass extending from the pleura to the hilum with numerous abnormal left-sided hilar and mediastinal nodes. Additional upper retroperitoneal nodes as well as extensive liver metastases. Multifocal osseous metastatic disease including the thoracic and lumbar spine, pelvis, right humeral head and left scapula. Based on the overall appearance and with the patient's laboratory a  abnormality this very well could be a primary left lower lobe lung lesion such as small cell versus a neuroendocrine tumor with diffuse metastatic disease. Electronically Signed   By: Adrianna Horde M.D.   On: 12/04/2023 20:30  PERFORMANCE STATUS (ECOG) : 1 - Symptomatic but completely ambulatory  Review of Systems Unless otherwise noted, a complete review of systems is negative.  Physical Exam General: NAD Pulmonary: Unlabored Extremities: no edema, no joint deformities Skin: no rashes Neurological: nonfocal  IMPRESSION: Follow-up visit.  Patient seen in infusion.  Patient states he is doing reasonably well.  Denies any significant changes or concerns today.  States that he did have some insomnia on steroids but that has improved.  States that he got 7 hours of sleep last night.  Denies significant anxiety or depression.  Denies pain or other distressing symptoms.  Patient states that he is still considering planning and future decision making.  He is trying to figure out future care for his wife.  PLAN: - Continue current scope of treatment - Patient speaking with his attorney to update ACP - MOST Form reviewed - Follow-up telephone visit 1 month  Patient expressed understanding and was in agreement with this plan. He also understands that He can call the clinic at any time with any questions, concerns, or complaints.     Time Total: 15 minutes  Visit consisted of counseling and education dealing with the complex and emotionally intense issues of symptom management and palliative care in the setting of serious and potentially life-threatening illness.Greater than 50%  of this time was spent counseling and coordinating care related to the above assessment and plan.  Signed by: Gerilyn Kobus, PhD, NP-C

## 2023-12-23 NOTE — Progress Notes (Addendum)
 Hematology/Oncology Progress note [phone] Fax:([phone]) [phone]     CHIEF COMPLAINTS/PURPOSE OF CONSULTATION:  Metastatic squamous cell carcinoma  ASSESSMENT & PLAN:   Squamous cell carcinoma lung, left (HCC) Imaging results and pathology results were reviewed and discussed with patient. 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 I recommend to send off NGS plus PD-L1. Labs are reviewed and discussed with patient.' Proceed with cycle 1 Carboplatin + Taxol with D3 GCSF. Recommend Claritin 10mg  daily for 4 days.  Plan to add keytruda in the future.   Metastasis to brain (HCC) Continue Dexamethasone  4mg  BID.  Follow up with radiation oncologist.  Refer to neurology  Elevated alkaline phosphatase level Likely due to liver metastasis.     Orders Placed This Encounter  Procedures   CBC with Differential (Cancer Center Only)    Standing Status:   Future    Expected Date:   12/30/2023    Expiration Date:   12/22/2024   CMP (Cancer Center only)    Standing Status:   Future    Expected Date:   12/30/2023    Expiration Date:   12/22/2024   Follow up  1 week.   All questions were answered. The patient knows to call the clinic with any problems, questions or concerns.  Tony Forbes, MD, PhD [facility] Hematology Oncology 12/23/2023    HISTORY OF PRESENTING ILLNESS:  Tony Mitchell 83 y.o. male presents to establish care for liver masses  Oncology History  Squamous cell carcinoma lung, left (HCC)  11/22/2023 Imaging   CT abdomen pelvis w contrast  1. Multiple hepatic metastatic lesions. Correlation with history of known malignancy recommended. 2. Serosal implant along the distal stomach versus gastrohepatic adenopathy. 3. Thickened appearance of the gastric antrum and pylorus likely related to underdistention. An infiltrative mass is less likely but not excluded. Endoscopy may provide  better evaluation. 4. Herniation of a segment of the sigmoid colon into the left inguinal canal. No bowel obstruction.     12/04/2023 Imaging   PET scan showed  Extensive hypermetabolic neoplastic disease including left lower lobe infiltrative mass extending from the pleura to the hilum with numerous abnormal left-sided hilar and mediastinal nodes.   Additional upper retroperitoneal nodes as well as extensive liver metastases.   Multifocal osseous metastatic disease including the thoracic and lumbar spine, pelvis, right humeral head and left scapula.   Based on the overall appearance and with the patient's laboratory a abnormality this very well could be a primary left lower lobe lung lesion such as small cell versus a neuroendocrine tumor with diffuse metastatic disease.   12/10/2023 Imaging   MRI brain w wo contrast  1. 10 x 9 x 10 mm rounded lesion in the posterior left frontal lobe with surrounding vasogenic edema is consistent with metastatic disease. 2. No other pathologic enhancement is present. 3. Periventricular and scattered subcortical T2 hyperintensities bilaterally are mildly advanced for age. The finding is nonspecific but can be seen in the setting of chronic microvascular ischemia, a demyelinating process such as multiple sclerosis, vasculitis, complicated migraine headaches, or as the sequelae of a prior infectious or inflammatory process.   12/17/2023 Initial Diagnosis   Squamous cell carcinoma lung, left (HCC)  He began experiencing abdominal pain for a week.  Initially presenting as increased gas and stomach growling despite having eaten. The pain was localized between the rib cage and the abdomen and described as intermittent.  Later the pain  had resolved, but he continues to feel bloated. He has CT abdomen pelvis done which showed liver masses.   Liver biopsy showed  1. Liver, biopsy, right hepatic lesion :      - METASTATIC POORLY DIFFERENTIATED SQUAMOUS  CELL CARCINOMA WITH NECROSIS.  Diagnosis Note : Per [facility] a recent PET scan revealed a hypermetabolic left lower lobe lung mass with extensive metastatic lesions. The carcinoma is positive for cytokeratin 7, p40 (greater than 50%), and CD56 (subset). Cytokeratin 20, TTF-1,  chromogranin, synaptophysin, and CDX-2 are negative. In the absence of  chromogranin / synaptophysin staining the presence of patchy CD56 staining is interpreted as non-specific. The pattern of immunoreactivity is consistent with metastatic poorly differentiated squamous cell carcinoma.     12/17/2023 Cancer Staging   Staging form: Lung, AJCC V9 - Clinical stage from 12/17/2023: Tony Mitchell, pM1c - Signed by Tony Forbes, MD on 12/17/2023 Histopathologic type: Squamous cell carcinoma, NOS Stage prefix: Initial diagnosis Laterality: Left Sites of metastasis: Bone, Liver, Brain   12/23/2023 -  Chemotherapy   Patient is on Treatment Plan : LUNG Carboplatin + Paclitaxel q21d       Patient denies changes in bowel habits.  Denies unintentional weight loss night sweats or fever. No history of blood transfusions or hepatitis. He is not on any blood thinners but takes aspirin  for preventive purposes.  He has a history of stage 3 renal disease diagnosed three years ago after a blood test revealed elevated potassium levels. Since then, he has made significant lifestyle changes, including stopping alcohol consumption and losing weight. He now maintains a weight between 145 and 151 pounds and walks approximately 5.6 miles daily.  He has a history of skin cancer, treated with resection, and is scheduled for further treatment. His father had prostate cancer.  Patient had a normal PSA in January 2025.   Patient lives at home with wife who has multiple medical problems.  He is the caregiver for her. His daughter passed away last year.  He has granddaughter who lives in [place] .  He denies SOB, chest pain, hemoptysis.  Former smoker.  Abdominal pain  has improved and he did not need to take any pain meds.   INTERVAL HISTORY Tony Mitchell is a 83 y.o. male who has above history reviewed by me today presents for follow up visit for treatment of metastatic lung cancer.  He has been to chemotherapy class and has antiemetics.  He take Dexamethasone  4mg  BID.     MEDICAL HISTORY:  Past Medical History:  Diagnosis Date   Acute appendicitis 03/03/2018   Arthritis    Cancer (HCC)    skin cancer   Chronic kidney disease, stage 3b (HCC)    Collapsed lung 2019   x2   COPD (chronic obstructive pulmonary disease) (HCC)    Coronary artery disease    Diabetes mellitus without complication (HCC)    diet controlled-lost 60 lbs and is not on any current meds   GERD (gastroesophageal reflux disease)    Gout    Hyperlipidemia    Hypertension    Sleep apnea    uses cpap   Thoracic aortic aneurysm (HCC)    Vitamin B 12 deficiency     SURGICAL HISTORY: Past Surgical History:  Procedure Laterality Date   CARDIAC CATHETERIZATION     CATARACT EXTRACTION  2012   CHEST TUBE INSERTION     x2   COLONOSCOPY WITH PROPOFOL  N/A 04/14/2021   Procedure: COLONOSCOPY WITH PROPOFOL ;  Surgeon: Leida Puna  T, MD;  Location: [facility] ENDOSCOPY;  Service: Endoscopy;  Laterality: N/A;   ESOPHAGOGASTRODUODENOSCOPY (EGD) WITH PROPOFOL  N/A 04/14/2021   Procedure: ESOPHAGOGASTRODUODENOSCOPY (EGD) WITH PROPOFOL ;  Surgeon: Shane Darling, MD;  Location: [facility] ENDOSCOPY;  Service: Endoscopy;  Laterality: N/A;   INSERTION OF MESH  09/22/2021   Procedure: INSERTION OF MESH;  Surgeon: Conrado Delay, DO;  Location: [facility] ORS;  Service: General;;   JOINT REPLACEMENT     KNEE SURGERY Left 1965   LAPAROSCOPIC APPENDECTOMY N/A 03/03/2018   Procedure: APPENDECTOMY LAPAROSCOPIC;  Surgeon: Franki Isles, MD;  Location: [facility] ORS;  Service: General;  Laterality: N/A;   TOTAL KNEE ARTHROPLASTY Left 09/16/2018   Procedure: TOTAL KNEE ARTHROPLASTY;  Surgeon: Elner Hahn, MD;  Location: [facility] ORS;  Service: Orthopedics;  Laterality: Left;    SOCIAL HISTORY: Social History   Socioeconomic History   Marital status: Married    Spouse name: Not on file   Number of children: Not on file   Years of education: Not on file   Highest education level: Not on file  Occupational History   Not on file  Tobacco Use   Smoking status: Former    Current packs/day: 0.00    Average packs/day: 1 pack/day for 40.0 years (40.0 ttl pk-yrs)    Types: Cigarettes    Start date: 06/15/1956    Quit date: 06/15/1996    Years since quitting: 27.5    Passive exposure: Past   Smokeless tobacco: Never  Vaping Use   Vaping status: Never Used  Substance and Sexual Activity   Alcohol use: Not Currently    Alcohol/week: 21.0 standard drinks of alcohol    Types: 21 Shots of liquor per week    Comment: reports last drink 11/03/20   Drug use: No   Sexual activity: Yes  Other Topics Concern   Not on file  Social History Narrative   Not on file   Social Drivers of Health   Financial Resource Strain: Low Risk  (10/30/2023)   Received from [facility]   Overall Financial Resource Strain (CARDIA)    Difficulty of Paying Living Expenses: Not hard at all  Food Insecurity: No Food Insecurity (10/30/2023)   Received from [facility]   Hunger Vital Sign    Worried About Running Out of Food in the Last Year: Never true    Ran Out of Food in the Last Year: Never true  Transportation Needs: No Transportation Needs (10/30/2023)   Received from [facility] - [facility]    In the past 12 months, has lack of transportation kept you from medical appointments or from getting medications?: No    Lack of Transportation (Non-Medical): No  Physical Activity: Sufficiently Active (07/10/2017)   Received from [facility], [facility]   Exercise Vital Sign    Days of Exercise per Week: 5  days    Minutes of Exercise per Session: 60 min  Stress: Stress Concern Present (07/10/2017)   Received from [facility], [facility]   [employer] [facility] Occupational [facility] - Occupational Stress Questionnaire    Feeling of Stress : Rather much  Social Connections: Unknown (07/10/2017)   Received from [facility], [facility]   Social Connection and Isolation Panel [NHANES]    Frequency of Communication with Friends and Family: Patient declined    Frequency of Social Gatherings with Friends and Family:  Patient declined    Attends Religious Services: Patient declined    Active Member of Clubs or Organizations: Patient declined    Attends Banker Meetings: Patient declined    Marital Status: Patient declined  Intimate Partner Violence: Not on file    FAMILY HISTORY: Family History  Problem Relation Age of Onset   Cancer Father        Unknown   Prostate cancer Father    Lung cancer Neg Hx     ALLERGIES:  has no known allergies.  MEDICATIONS:  Current Outpatient Medications  Medication Sig Dispense Refill   allopurinol  (ZYLOPRIM ) 100 MG tablet Take 100 mg by mouth in the morning and at bedtime.     ALPRAZolam  (XANAX ) 0.5 MG tablet Take 0.5 mg by mouth at bedtime.     amLODipine  (NORVASC ) 2.5 MG tablet Take 2.5 mg by mouth in the morning.     dexamethasone  (DECADRON ) 4 MG tablet Take 1 tablet (4 mg total) by mouth 2 (two) times daily. 30 tablet 0   fenofibrate  (TRICOR ) 145 MG tablet Take 145 mg by mouth 2 (two) times a week. On Wednesday and Sunday     finasteride  (PROSCAR ) 5 MG tablet Take 1 tablet (5 mg total) by mouth daily. 90 tablet 3   gabapentin  (NEURONTIN ) 100 MG capsule Take 100 mg by mouth.     hydrALAZINE  (APRESOLINE ) 25 MG tablet Take 25 mg by mouth 2 (two) times daily.     lansoprazole (PREVACID) 15 MG capsule Take 15 mg by mouth every evening.     ondansetron  (ZOFRAN ) 8 MG tablet  Take 1 tablet (8 mg total) by mouth every 8 (eight) hours as needed for nausea or vomiting. Start on the third day after carboplatin. 30 tablet 1   oxyCODONE  (OXY IR/ROXICODONE ) 5 MG immediate release tablet Take 1 tablet (5 mg total) by mouth every 6 (six) hours as needed for severe pain (pain score 7-10) or breakthrough pain. 10 tablet 0   prochlorperazine  (COMPAZINE ) 10 MG tablet Take 1 tablet (10 mg total) by mouth every 6 (six) hours as needed for nausea or vomiting. 30 tablet 1   simvastatin  (ZOCOR ) 20 MG tablet Take 1 tablet by mouth 2 (two) times a week. On  Wednesday and Sunday     tamsulosin  (FLOMAX ) 0.4 MG CAPS capsule Take 1 capsule (0.4 mg total) by mouth daily. 30 capsule 11   vitamin B-12 (CYANOCOBALAMIN ) 1000 MCG tablet Take 1,000 mcg by mouth every morning.     Vitamin D, Ergocalciferol, (DRISDOL) 1.25 MG (50000 UNIT) CAPS capsule Take 50,000 Units by mouth every 7 (seven) days.     colchicine 0.6 MG tablet Take 2 tablets (1.2mg ) by mouth at first sign of gout flare followed by 1 tablet (0.6mg ) after 1 hour. (Max 1.8mg  within 1 hour) (Patient not taking: Reported on 12/12/2023)     triamcinolone  ointment (KENALOG ) 0.5 % Apply 1 Application topically 2 (two) times daily. (Patient not taking: Reported on 12/12/2023) 30 g 0   No current facility-administered medications for this visit.   Facility-Administered Medications Ordered in Other Visits  Medication Dose Route Frequency Provider Last Rate Last Admin   0.9 %  sodium chloride  infusion   Intravenous Continuous Tony Forbes, MD 10 mL/hr at 12/23/23 0932 New Bag at 12/23/23 0932   CARBOplatin (PARAPLATIN) 360 mg in sodium chloride  0.9 % 100 mL chemo infusion  360 mg Intravenous Once Lauramae Kneisley, MD       PACLitaxel (TAXOL) 276 mg  in sodium chloride  0.9 % 250 mL chemo infusion (> 80mg /m2)  150 mg/m2 (Treatment Plan Recorded) Intravenous Once Tony Forbes, MD 99 mL/hr at 12/23/23 1154 Rate Change at 12/23/23 1154    Review of Systems   Constitutional:  Negative for appetite change, chills, fatigue, fever and unexpected weight change.  HENT:   Negative for hearing loss and voice change.   Eyes:  Negative for eye problems and icterus.  Respiratory:  Negative for chest tightness, cough and shortness of breath.   Cardiovascular:  Negative for chest pain and leg swelling.  Gastrointestinal:  Negative for abdominal distention and abdominal pain.       Bloating  Endocrine: Negative for hot flashes.  Genitourinary:  Negative for difficulty urinating, dysuria and frequency.   Musculoskeletal:  Negative for arthralgias.  Skin:  Negative for itching and rash.  Neurological:  Negative for light-headedness and numbness.  Hematological:  Negative for adenopathy. Does not bruise/bleed easily.  Psychiatric/Behavioral:  Negative for confusion.      PHYSICAL EXAMINATION: ECOG PERFORMANCE STATUS: 1 - Symptomatic but completely ambulatory  Vitals:   12/23/23 0836  BP: (!) 137/55  Pulse: 63  Resp: 18  Temp: (!) 97.2 F (36.2 C)  SpO2: 99%   Filed Weights   12/23/23 0836  Weight: 142 lb 4.8 oz (64.5 kg)    Physical Exam Constitutional:      General: He is not in acute distress.    Appearance: He is not diaphoretic.  HENT:     Head: Normocephalic and atraumatic.     Nose: Nose normal.  Eyes:     General: No scleral icterus. Cardiovascular:     Rate and Rhythm: Normal rate and regular rhythm.     Heart sounds: No murmur heard. Pulmonary:     Effort: Pulmonary effort is normal. No respiratory distress.  Abdominal:     General: There is no distension.     Palpations: Abdomen is soft. There is mass.     Tenderness: There is no abdominal tenderness.  Musculoskeletal:        General: Normal range of motion.     Cervical back: Normal range of motion and neck supple.  Skin:    General: Skin is warm and dry.     Findings: No erythema.  Neurological:     Mental Status: He is alert and oriented to person, place, and  time. Mental status is at baseline.     Cranial Nerves: No cranial nerve deficit.     Motor: No abnormal muscle tone.     Coordination: Coordination normal.  Psychiatric:        Mood and Affect: Mood and affect normal.      LABORATORY DATA:  I have reviewed the data as listed    Latest Ref Rng & Units 12/23/2023    8:09 AM 12/12/2023   10:15 AM 11/20/2020    1:35 PM  CBC  WBC 4.0 - 10.5 K/uL 10.4  6.0  6.3   Hemoglobin 13.0 - 17.0 g/dL 84.1  32.4  40.1   Hematocrit 39.0 - 52.0 % 42.3  39.9  33.8   Platelets 150 - 400 K/uL 278  289  356       Latest Ref Rng & Units 12/23/2023    8:09 AM 12/12/2023   10:15 AM 11/27/2023    4:17 PM  CMP  Glucose 70 - 99 mg/dL 027  96  253   BUN 8 - 23 mg/dL 40  24  16  Creatinine 0.61 - 1.24 mg/dL 5.28  4.13  2.44   Sodium 135 - 145 mmol/L 132  139  136   Potassium 3.5 - 5.1 mmol/L 4.4  3.8  4.0   Chloride 98 - 111 mmol/L 97  105  99   CO2 22 - 32 mmol/L 24  27  29    Calcium 8.9 - 10.3 mg/dL 8.6  9.2  9.2   Total Protein 6.5 - 8.1 g/dL 6.2   7.0   Total Bilirubin 0.0 - 1.2 mg/dL 1.0   0.8   Alkaline Phos 38 - 126 U/L 353   150   AST 15 - 41 U/L 119   79   ALT 0 - 44 U/L 54   31      RADIOGRAPHIC STUDIES: I have personally reviewed the radiological images as listed and agreed with the findings in the report. MR Brain W Wo Contrast Result Date: 12/18/2023 CLINICAL DATA:  Metastatic disease evaluation.  SRS planning. EXAM: MRI HEAD WITHOUT AND WITH CONTRAST TECHNIQUE: Multiplanar, multiecho pulse sequences of the brain and surrounding structures were obtained without and with intravenous contrast. CONTRAST:  6mL GADAVIST  GADOBUTROL  1 MMOL/ML IV SOLN COMPARISON:  12/10/2023 FINDINGS: Brain: Redemonstration of a 10 mm centrally necrotic metastasis at the left frontoparietal vertex with mild surrounding edema. No second lesion identified. No abnormality of the brainstem or cerebellum. Cerebral hemispheres otherwise show mild chronic small-vessel ischemic  change of the white matter, less than often seen at this age. No hydrocephalus or extra-axial collection. Vascular: Major vessels at the base of the brain show flow. Skull and upper cervical spine: Negative Sinuses/Orbits: Clear/normal Other: None IMPRESSION: Redemonstration of a 10 mm centrally necrotic metastasis at the left frontoparietal vertex with mild surrounding edema. No second lesion. Electronically Signed   By: Bettylou Brunner M.D.   On: 12/18/2023 13:45   US  BIOPSY (LIVER) Result Date: 12/12/2023 INDICATION: 83 year old with concern for metastatic disease. Patient has numerous liver lesions and needs a tissue diagnosis. EXAM: ULTRASOUND-GUIDED LIVER LESION BIOPSY MEDICATIONS: Moderate sedation ANESTHESIA/SEDATION: Moderate (conscious) sedation was employed during this procedure. A total of Versed  1 mg and Fentanyl  50 mcg was administered intravenously by the radiology nurse. Total intra-service moderate Sedation Time: 18 minutes. The patient's level of consciousness and vital signs were monitored continuously by radiology nursing throughout the procedure under my direct supervision. FLUOROSCOPY TIME:  None COMPLICATIONS: None immediate. PROCEDURE: Informed written consent was obtained from the patient after a thorough discussion of the procedural risks, benefits and alternatives. All questions were addressed. Maximal Sterile Barrier Technique was utilized including caps, mask, sterile gowns, sterile gloves, sterile drape, hand hygiene and skin antiseptic. A timeout was performed prior to the initiation of the procedure. Liver was evaluated with ultrasound. Numerous hepatic lesions were identified. The right anterior abdomen was prepped with chlorhexidine  and sterile field was created. Skin was anesthetized using 1% lidocaine . Small incision was made. Using ultrasound guidance, 17 gauge coaxial needle was directed into a right hepatic lesion. Total of 5 core biopsies were performed with an 18 gauge core  device. Adequate specimens obtained and placed in formalin. Gel-Foam slurry was injected as the 17 gauge needle was removed. Bandage placed over the puncture site. FINDINGS: Innumerable hepatic lesions. Many of hepatic lesions are partially cystic and necrotic. A hyperechoic lesion that appeared to be mostly solid in the right hepatic lobe was targeted. Biopsy needle confirmed within the lesion. No immediate bleeding or hematoma formation after the biopsy. IMPRESSION: Ultrasound-guided core biopsy  of a right hepatic lesion. Electronically Signed   By: Elene Griffes M.D.   On: 12/12/2023 13:33   MR Brain W Wo Contrast Result Date: 12/12/2023 CLINICAL DATA:  Liver lesion.  Diffuse metastatic disease. EXAM: MRI HEAD WITHOUT AND WITH CONTRAST TECHNIQUE: Multiplanar, multiecho pulse sequences of the brain and surrounding structures were obtained without and with intravenous contrast. CONTRAST:  7mL GADAVIST  GADOBUTROL  1 MMOL/ML IV SOLN COMPARISON:  None Available. FINDINGS: Brain: A rounded lesion in the posterior left frontal lobe measures 10 x 9 x 10 mm on image 132 of series 16. Surrounding vasogenic edema is present. No other pathologic enhancement is present. No acute infarct or hemorrhage is present. Periventricular and scattered subcortical T2 hyperintensities are mildly advanced for age. Deep brain nuclei are within normal limits. The ventricles are of normal size. No significant extraaxial fluid collection is present. The brainstem and cerebellum are within normal limits. The internal auditory canals are within normal limits. Midline structures are within normal limits. Vascular: Insert normal flow Skull and upper cervical spine: The craniocervical junction is normal. Upper cervical spine is within normal limits. Marrow signal is unremarkable. Sinuses/Orbits: The paranasal sinuses and mastoid air cells are clear. Bilateral lens replacements are noted. Globes and orbits are otherwise unremarkable. IMPRESSION: 1.  10 x 9 x 10 mm rounded lesion in the posterior left frontal lobe with surrounding vasogenic edema is consistent with metastatic disease. 2. No other pathologic enhancement is present. 3. Periventricular and scattered subcortical T2 hyperintensities bilaterally are mildly advanced for age. The finding is nonspecific but can be seen in the setting of chronic microvascular ischemia, a demyelinating process such as multiple sclerosis, vasculitis, complicated migraine headaches, or as the sequelae of a prior infectious or inflammatory process. Electronically Signed   By: Audree Leas M.D.   On: 12/12/2023 13:32   NM PET Image Initial (PI) Skull Base To Thigh Result Date: 12/04/2023 CLINICAL DATA:  Initial treatment strategy for metastatic disease. Elevated tumor marker chromogranin A. EXAM: NUCLEAR MEDICINE PET SKULL BASE TO THIGH TECHNIQUE: 7.97 mCi F-18 FDG was injected intravenously. Full-ring PET imaging was performed from the skull base to thigh after the radiotracer. CT data was obtained and used for attenuation correction and anatomic localization. Fasting blood glucose: 75 mg/dl COMPARISON:  Abdomen pelvis CT 11/22/2023 and older. Chest x-ray 11/13/2022. FINDINGS: Mediastinal blood pool activity: SUV max 2.0 NECK: No specific abnormal radiotracer uptake seen in the neck including along lymph node change of the submandibular, posterior triangle or internal jugular region. There is symmetric uptake seen of the visualized intracranial compartment. Incidental CT findings: Visualized portions of the paranasal sinuses and mastoid air cells are clear. Vascular calcifications are seen. The parotid glands, submandibular glands and thyroid gland are unremarkable. CHEST: There is hypermetabolic infiltrative appearing mass in the superior segment of the left lower lobe with maximum SUV value of 19.1. There are some spiculations identified. The tumor extends from the hilum of the left lower lobe out to the pleura  laterally. Lesion on series 6, image 61 measures 3.7 by 2.6 and appearance. This not include the component extending more medial to the hilum. There is confluent abnormal uptake in tissue involving the hilum itself, likely abnormal nodes. Abnormal nodes are also seen extending left paratracheal. The hilar area has maximum SUV approaching 11.5. The left paratracheal lesion has maximum SUV value of 12.7 and dimension on CT image 53 of 3.4 by 2.4 cm. Additional left paratracheal nodes extending more superiorly. The most superior hypermetabolic  node is identified just deep to the left subclavian artery on CT image 44 measuring 6 mm in short axis but has maximum SUV value of 8.6. Additional caudal abnormal node subcarinal on the left. No specific hypermetabolic nodes identified in the axillary regions or right hilum. On the PET image there is an area of uptake along the right extreme lung base. There is no nodule in this location. This could be related to misregistration with a dome liver lesion. Incidental CT findings: Mild linear opacity at the lung bases likely scar or atelectasis. No pleural effusion or pneumothorax. Prominent coronary artery calcifications are seen. No pericardial effusion. Diffuse vascular calcifications along the aorta. 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 At least 20 lesions are identified. There are several areas which are confluent in 2 larger measurable lesions. Specific examples would include lesion left hepatic lobe on the previous examination measuring 4.3 x 3.8 cm. This is seen posteriorly along segment 3 and today has maximum SUV value of 15.9. The confluent area of multiple lesions along the inferior right hepatic lobe that measured on the prior contrast CT 89.3 x 5.1 cm, today would have an overall maximum SUV value of 18.2. No other specific areas of abnormal uptake identified along the parenchymal organs. No abnormal uptake along bowel or renal  collecting systems. There is only trace uptake along the left adrenal gland, nonspecific. There are some abnormal hypermetabolic upper abdominal nodes. Dominant focus is seen portacaval with maximum SUV 18.3 and this lesion on series 6, image 93 measures 3.1 by 1.9 cm. Small area of abnormal uptake maximum SUV value 3.0 along a left para-node in the upper retroperitoneum measuring 7 mm on image 90. This is medial to the left adrenal gland just superior. Additional Incidental CT findings: Diffuse vascular calcifications are identified. Mild renal atrophy. No obstructing renal or ureteral stones. The bowel is nondilated with diffuse colonic stool. There is a left inguinal hernia involving portions of the sigmoid colon without obstruction or dilatation. Slight wall thickening of the non dilated underdistended stomach. The prior CT report describes an area of thickening along the antrum of the stomach. This is unchanged in appearance by CT but there is no abnormal uptake. This could be simply underdistended. SKELETON: There is multifocal hypermetabolic osseous metastatic disease. These include along the spine at L5, L4, L3 but also largest have the vertebral body of T5 with maximum SUV value of 14.9. There are foci seen along the right humeral head, left scapula, right iliac crest, left iliac crest and both upper lateral portions of the sacrum. The right iliac crest lesion anteriorly has some mild mixed lucent and sclerotic foci on CT and has maximum SUV value of 13.7. Incidental CT findings: Scattered degenerative changes seen of the spine and pelvis. Osteopenia. IMPRESSION: Extensive hypermetabolic neoplastic disease including left lower lobe infiltrative mass extending from the pleura to the hilum with numerous abnormal left-sided hilar and mediastinal nodes. Additional upper retroperitoneal nodes as well as extensive liver metastases. Multifocal osseous metastatic disease including the thoracic and lumbar spine,  pelvis, right humeral head and left scapula. Based on the overall appearance and with the patient's laboratory a abnormality this very well could be a primary left lower lobe lung lesion such as small cell versus a neuroendocrine tumor with diffuse metastatic disease. Electronically Signed   By: Adrianna Horde M.D.   On: 12/04/2023 20:30

## 2023-12-23 NOTE — Progress Notes (Signed)
 Met with patient during follow up visit with Dr. Wilhelmenia Harada to start chemotherapy treatments. All questions answered during visit. Reviewed upcoming appts. Instructed to call with any questions or needs. Pt verbalized understanding.

## 2023-12-23 NOTE — Addendum Note (Signed)
 Addended by: [name] on: 12/23/2023 12:33 PM   Modules accepted: Orders

## 2023-12-24 ENCOUNTER — Ambulatory Visit
Admission: RE | Admit: 2023-12-24 | Discharge: 2023-12-24 | Disposition: A | Discharge status: Home / Self Care | Source: Ambulatory Visit | Attending: Radiation Oncology | Admitting: Radiation Oncology

## 2023-12-24 ENCOUNTER — Telehealth: Payer: Self-pay

## 2023-12-24 ENCOUNTER — Telehealth: Payer: Self-pay | Admitting: *Deleted

## 2023-12-24 DIAGNOSIS — Z510 Encounter for antineoplastic radiation therapy: Secondary | ICD-10-CM | POA: Insufficient documentation

## 2023-12-24 DIAGNOSIS — C7931 Secondary malignant neoplasm of brain: Secondary | ICD-10-CM

## 2023-12-24 DIAGNOSIS — C3492 Malignant neoplasm of unspecified part of left bronchus or lung: Secondary | ICD-10-CM | POA: Insufficient documentation

## 2023-12-24 DIAGNOSIS — Z5111 Encounter for antineoplastic chemotherapy: Secondary | ICD-10-CM | POA: Diagnosis not present

## 2023-12-24 NOTE — Telephone Encounter (Signed)
-----   Message from [name] sent at 12/23/2023 12:15 PM EDT ----- Looks like Dr. Jacalyn Martin ordered a repeat MRI and his Radiation schedule is not finalized yet. I refilled his Dexamethasone  4mg  BID. Please refer him to establish care with Dr.Vaslow. Thanks.   Allyne Areola

## 2023-12-24 NOTE — Telephone Encounter (Signed)
Telephone call to patient for follow up after receiving first infusion.   Patient states infusion went great.  States eating good and drinking plenty of fluids.   Denies any nausea or vomiting.  Encouraged patient to call for any concerns or questions. 

## 2023-12-24 NOTE — Telephone Encounter (Signed)
 Pt made aware of referral being placed to establish care with Dr. Mark Sil.

## 2023-12-25 ENCOUNTER — Inpatient Hospital Stay

## 2023-12-25 DIAGNOSIS — C3492 Malignant neoplasm of unspecified part of left bronchus or lung: Secondary | ICD-10-CM

## 2023-12-25 DIAGNOSIS — Z5111 Encounter for antineoplastic chemotherapy: Secondary | ICD-10-CM | POA: Diagnosis not present

## 2023-12-25 MED ORDER — PEGFILGRASTIM-FPGK 6 MG/0.6ML ~~LOC~~ SOSY
6.0000 mg | PREFILLED_SYRINGE | Freq: Once | SUBCUTANEOUS | Status: AC
  Administered 2023-12-25: 6 mg via SUBCUTANEOUS
  Filled 2023-12-25: qty 0.6

## 2023-12-25 NOTE — Progress Notes (Signed)
 Multidisciplinary Oncology Council Documentation  Tony Mitchell was presented by our [facility] on 12/25/2023, which included representatives from:  Palliative Care Dietitian  Physical/Occupational Therapist Nurse Navigator Genetics Social work Survivorship RN Financial Navigator Research RN   Tony Mitchell currently presents with history of SCC  We reviewed previous medical and familial history, history of present illness, and recent lab results along with all available histopathologic and imaging studies. The MOC considered available treatment options and made the following recommendations/referrals:  Nutrition, SW, rehab screening  The MOC is a meeting of clinicians from various specialty areas who evaluate and discuss patients for whom a multidisciplinary approach is being considered. Final determinations in the plan of care are those of the provider(s).   Today's extended care, comprehensive team conference, Tony Mitchell was not present for the discussion and was not examined.

## 2023-12-30 ENCOUNTER — Inpatient Hospital Stay

## 2023-12-30 ENCOUNTER — Telehealth: Payer: Self-pay | Admitting: *Deleted

## 2023-12-30 ENCOUNTER — Encounter: Payer: Self-pay | Admitting: *Deleted

## 2023-12-30 ENCOUNTER — Other Ambulatory Visit: Payer: Self-pay

## 2023-12-30 ENCOUNTER — Encounter: Payer: Self-pay | Admitting: Oncology

## 2023-12-30 ENCOUNTER — Inpatient Hospital Stay (HOSPITAL_BASED_OUTPATIENT_CLINIC_OR_DEPARTMENT_OTHER): Admitting: Oncology

## 2023-12-30 ENCOUNTER — Ambulatory Visit
Admission: RE | Admit: 2023-12-30 | Discharge: 2023-12-30 | Disposition: A | Discharge status: Home / Self Care | Source: Ambulatory Visit | Attending: Radiation Oncology | Admitting: Radiation Oncology

## 2023-12-30 VITALS — BP 137/64 | HR 62 | Temp 97.3°F | Resp 20 | Wt 139.4 lb

## 2023-12-30 DIAGNOSIS — Z510 Encounter for antineoplastic radiation therapy: Secondary | ICD-10-CM | POA: Diagnosis not present

## 2023-12-30 DIAGNOSIS — Z923 Personal history of irradiation: Secondary | ICD-10-CM | POA: Diagnosis not present

## 2023-12-30 DIAGNOSIS — I712 Thoracic aortic aneurysm, without rupture, unspecified: Secondary | ICD-10-CM | POA: Diagnosis not present

## 2023-12-30 DIAGNOSIS — Z79899 Other long term (current) drug therapy: Secondary | ICD-10-CM | POA: Diagnosis not present

## 2023-12-30 DIAGNOSIS — N1832 Chronic kidney disease, stage 3b: Secondary | ICD-10-CM | POA: Diagnosis not present

## 2023-12-30 DIAGNOSIS — Z7952 Long term (current) use of systemic steroids: Secondary | ICD-10-CM | POA: Diagnosis not present

## 2023-12-30 DIAGNOSIS — Z85828 Personal history of other malignant neoplasm of skin: Secondary | ICD-10-CM | POA: Diagnosis not present

## 2023-12-30 DIAGNOSIS — E538 Deficiency of other specified B group vitamins: Secondary | ICD-10-CM | POA: Diagnosis not present

## 2023-12-30 DIAGNOSIS — C7931 Secondary malignant neoplasm of brain: Secondary | ICD-10-CM

## 2023-12-30 DIAGNOSIS — R634 Abnormal weight loss: Secondary | ICD-10-CM | POA: Insufficient documentation

## 2023-12-30 DIAGNOSIS — C3492 Malignant neoplasm of unspecified part of left bronchus or lung: Secondary | ICD-10-CM

## 2023-12-30 DIAGNOSIS — R748 Abnormal levels of other serum enzymes: Secondary | ICD-10-CM | POA: Diagnosis not present

## 2023-12-30 DIAGNOSIS — C787 Secondary malignant neoplasm of liver and intrahepatic bile duct: Secondary | ICD-10-CM | POA: Diagnosis not present

## 2023-12-30 DIAGNOSIS — J449 Chronic obstructive pulmonary disease, unspecified: Secondary | ICD-10-CM | POA: Diagnosis not present

## 2023-12-30 DIAGNOSIS — R600 Localized edema: Secondary | ICD-10-CM | POA: Diagnosis not present

## 2023-12-30 DIAGNOSIS — G4730 Sleep apnea, unspecified: Secondary | ICD-10-CM | POA: Diagnosis not present

## 2023-12-30 DIAGNOSIS — E785 Hyperlipidemia, unspecified: Secondary | ICD-10-CM | POA: Diagnosis not present

## 2023-12-30 DIAGNOSIS — Z8042 Family history of malignant neoplasm of prostate: Secondary | ICD-10-CM | POA: Diagnosis not present

## 2023-12-30 DIAGNOSIS — C7951 Secondary malignant neoplasm of bone: Secondary | ICD-10-CM | POA: Diagnosis not present

## 2023-12-30 DIAGNOSIS — E119 Type 2 diabetes mellitus without complications: Secondary | ICD-10-CM | POA: Diagnosis not present

## 2023-12-30 DIAGNOSIS — Z87891 Personal history of nicotine dependence: Secondary | ICD-10-CM | POA: Diagnosis not present

## 2023-12-30 DIAGNOSIS — I129 Hypertensive chronic kidney disease with stage 1 through stage 4 chronic kidney disease, or unspecified chronic kidney disease: Secondary | ICD-10-CM | POA: Diagnosis not present

## 2023-12-30 DIAGNOSIS — C3432 Malignant neoplasm of lower lobe, left bronchus or lung: Secondary | ICD-10-CM | POA: Diagnosis not present

## 2023-12-30 DIAGNOSIS — Z5111 Encounter for antineoplastic chemotherapy: Secondary | ICD-10-CM | POA: Diagnosis not present

## 2023-12-30 DIAGNOSIS — I2510 Atherosclerotic heart disease of native coronary artery without angina pectoris: Secondary | ICD-10-CM | POA: Diagnosis not present

## 2023-12-30 LAB — CBC WITH DIFFERENTIAL (CANCER CENTER ONLY)
Abs Immature Granulocytes: 0.22 10*3/uL — ABNORMAL HIGH (ref 0.00–0.07)
Basophils Absolute: 0 10*3/uL (ref 0.0–0.1)
Basophils Relative: 0 %
Eosinophils Absolute: 0.1 10*3/uL (ref 0.0–0.5)
Eosinophils Relative: 0 %
HCT: 40.4 % (ref 39.0–52.0)
Hemoglobin: 14 g/dL (ref 13.0–17.0)
Immature Granulocytes: 1 %
Lymphocytes Relative: 8 %
Lymphs Abs: 1.6 10*3/uL (ref 0.7–4.0)
MCH: 30.2 pg (ref 26.0–34.0)
MCHC: 34.7 g/dL (ref 30.0–36.0)
MCV: 87.1 fL (ref 80.0–100.0)
Monocytes Absolute: 1.8 10*3/uL — ABNORMAL HIGH (ref 0.1–1.0)
Monocytes Relative: 10 %
Neutro Abs: 15.3 10*3/uL — ABNORMAL HIGH (ref 1.7–7.7)
Neutrophils Relative %: 81 %
Platelet Count: 110 10*3/uL — ABNORMAL LOW (ref 150–400)
RBC: 4.64 MIL/uL (ref 4.22–5.81)
RDW: 13.4 % (ref 11.5–15.5)
Smear Review: NORMAL
WBC Count: 18.9 10*3/uL — ABNORMAL HIGH (ref 4.0–10.5)
nRBC: 0 % (ref 0.0–0.2)

## 2023-12-30 LAB — CMP (CANCER CENTER ONLY)
ALT: 57 U/L — ABNORMAL HIGH (ref 0–44)
AST: 133 U/L — ABNORMAL HIGH (ref 15–41)
Albumin: 3.5 g/dL (ref 3.5–5.0)
Alkaline Phosphatase: 407 U/L — ABNORMAL HIGH (ref 38–126)
Anion gap: 8 (ref 5–15)
BUN: 35 mg/dL — ABNORMAL HIGH (ref 8–23)
CO2: 25 mmol/L (ref 22–32)
Calcium: 8.7 mg/dL — ABNORMAL LOW (ref 8.9–10.3)
Chloride: 98 mmol/L (ref 98–111)
Creatinine: 0.78 mg/dL (ref 0.61–1.24)
GFR, Estimated: >60 mL/min (ref >60)
Glucose, Bld: 97 mg/dL (ref 70–99)
Potassium: 5 mmol/L (ref 3.5–5.1)
Sodium: 131 mmol/L — ABNORMAL LOW (ref 135–145)
Total Bilirubin: 1.1 mg/dL (ref 0.0–1.2)
Total Protein: 5.6 g/dL — ABNORMAL LOW (ref 6.5–8.1)

## 2023-12-30 LAB — RAD ONC ARIA SESSION SUMMARY
Course Elapsed Days: 0
Plan Fractions Treated to Date: 1
Plan Prescribed Dose Per Fraction: 20 Gy
Plan Total Fractions Prescribed: 1
Plan Total Prescribed Dose: 20 Gy
Reference Point Dosage Given to Date: 20 Gy
Reference Point Session Dosage Given: 20 Gy
Session Number: 1

## 2023-12-30 NOTE — Assessment & Plan Note (Addendum)
 Imaging results and pathology results were reviewed and discussed with patient. 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 I recommend to send off NGS plus PD-L1. Labs are reviewed and discussed with patient.' S/p  cycle 1 Carboplatin  + Taxol  with D3 GCSF.  He tolerated well.  TMB high,  add keytruda in the future.

## 2023-12-30 NOTE — Assessment & Plan Note (Signed)
 Likely due to liver metastasis.

## 2023-12-30 NOTE — Telephone Encounter (Signed)
 I spoke to the patient and let him know that they only needed about 15 more minutes on the infusion and so that is why they had to cancel it and make it be 6 hours rather than 5 hours and 45 minutes.  Patient understands .

## 2023-12-30 NOTE — Assessment & Plan Note (Signed)
 Continue Dexamethasone  4mg  BID.  Follow up with radiation oncologist.  Refer to neurology

## 2023-12-30 NOTE — Progress Notes (Signed)
 DISCONTINUE ON PATHWAY REGIMEN - Non-Small Cell Lung     A cycle is every 21 days:     Paclitaxel       Carboplatin    **Always confirm dose/schedule in your pharmacy ordering system**  PRIOR TREATMENT: [id number]: Carboplatin  AUC=6 + Paclitaxel  200 mg/m2 q21 Days x 1 Cycle  START ON PATHWAY REGIMEN - Non-Small Cell Lung     A cycle is every 21 days:     Pembrolizumab      Paclitaxel       Carboplatin    **Always confirm dose/schedule in your pharmacy ordering system**  Patient Characteristics: Stage IV Metastatic, Squamous, Molecular Analysis Completed, Alteration Present and Targeted Therapy Exhausted or EGFR Exon 20 Insertion or KRAS G12C or HER2 or NRG1 Present, and No Prior Chemo/Immunotherapy or No Alteration Present, PS = 0, 1, Initial  Chemotherapy/Immunotherapy, No Alteration Present, Immunotherapy Candidate, PD-L1 Expression Positive 1-49% (TPS) / Negative / Not Tested / Awaiting Test Results Therapeutic Status: Stage IV Metastatic Histology: Squamous Cell Molecular Analysis Results: No Alteration Present ECOG Performance Status: 1 Chemotherapy/Immunotherapy Line of Therapy: Initial Chemotherapy/Immunotherapy Immunotherapy Candidate Status: Candidate for Immunotherapy PD-L1 Expression Status: PD-L1 Negative Intent of Therapy: Non-Curative / Palliative Intent, Discussed with Patient

## 2023-12-30 NOTE — Assessment & Plan Note (Signed)
Refer to nutritionist 

## 2023-12-30 NOTE — Progress Notes (Signed)
 Hematology/Oncology Progress note [phone] [phone]     CHIEF COMPLAINTS/PURPOSE OF CONSULTATION:  Metastatic squamous cell carcinoma  ASSESSMENT & PLAN:   Squamous cell carcinoma lung, left (HCC) Imaging results and pathology results were reviewed and discussed with patient. 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 I recommend to send off NGS plus PD-L1. Labs are reviewed and discussed with patient.' S/p  cycle 1 Carboplatin  + Taxol  with D3 GCSF.  He tolerated well.  TMB high,  add keytruda in the future.   Metastasis to brain (HCC) Continue Dexamethasone  4mg  BID.  Follow up with radiation oncologist.  Refer to neurology  Elevated alkaline phosphatase level Likely due to liver metastasis.    Weight loss Refer to nutritionist    Orders Placed This Encounter  Procedures   Consent Attestation for Oncology Treatment    The patient is informed of risks, benefits, side-effects of the prescribed oncology treatment. Potential short term and long term side effects and response rates discussed. After a long discussion, the patient made informed decision to proceed.:   Yes   CBC with Differential (Cancer Center Only)    Standing Status:   Future    Expected Date:   01/14/2024    Expiration Date:   01/13/2025   CMP (Cancer Center only)    Standing Status:   Future    Expected Date:   01/14/2024    Expiration Date:   01/13/2025   TSH    Standing Status:   Future    Expected Date:   01/14/2024    Expiration Date:   01/13/2025   PHYSICIAN COMMUNICATION ORDER    Required labs: Thyroid function tests at baseline and every 3rd cycle.   Follow up  2 weeks  All questions were answered. The patient knows to call the clinic with any problems, questions or concerns.  [name], MD, PhD [facility] Hematology Oncology 12/30/2023    HISTORY OF PRESENTING ILLNESS:  Tony Mitchell  Tony Mitchell 83 y.o. male presents for follow up of stage IV lung SCC  Oncology History  Squamous cell carcinoma lung, left (HCC)  11/22/2023 Imaging   CT abdomen pelvis w contrast  1. Multiple hepatic metastatic lesions. Correlation with history of known malignancy recommended. 2. Serosal implant along the distal stomach versus gastrohepatic adenopathy. 3. Thickened appearance of the gastric antrum and pylorus likely related to underdistention. An infiltrative mass is less likely but not excluded. Endoscopy may provide better evaluation. 4. Herniation of a segment of the sigmoid colon into the left inguinal canal. No bowel obstruction.     12/04/2023 Imaging   PET scan showed  Extensive hypermetabolic neoplastic disease including left lower lobe infiltrative mass extending from the pleura to the hilum with numerous abnormal left-sided hilar and mediastinal nodes.   Additional upper retroperitoneal nodes as well as extensive liver metastases.   Multifocal osseous metastatic disease including the thoracic and lumbar spine, pelvis, right humeral head and left scapula.   Based on the overall appearance and with the patient's laboratory a abnormality this very well could be a primary left lower lobe lung lesion such as small cell versus a neuroendocrine tumor with diffuse metastatic disease.   12/10/2023 Imaging   MRI brain w wo contrast  1. 10 x 9 x 10 mm rounded lesion in the posterior left frontal lobe with surrounding vasogenic edema is consistent with metastatic disease. 2. No other pathologic enhancement is present. 3. Periventricular  and scattered subcortical T2 hyperintensities bilaterally are mildly advanced for age. The finding is nonspecific but can be seen in the setting of chronic microvascular ischemia, a demyelinating process such as multiple sclerosis, vasculitis, complicated migraine headaches, or as the sequelae of a prior infectious or inflammatory process.    12/17/2023 Initial Diagnosis   Squamous cell carcinoma lung, left (HCC)  He began experiencing abdominal pain for a week.  Initially presenting as increased gas and stomach growling despite having eaten. The pain was localized between the rib cage and the abdomen and described as intermittent.  Later the pain had resolved, but he continues to feel bloated. He has CT abdomen pelvis done which showed liver masses.   Liver biopsy showed  1. Liver, biopsy, right hepatic lesion :      - METASTATIC POORLY DIFFERENTIATED SQUAMOUS CELL CARCINOMA WITH NECROSIS.  Diagnosis Note : Per [facility] a recent PET scan revealed a hypermetabolic left lower lobe lung mass with extensive metastatic lesions. The carcinoma is positive for cytokeratin 7, p40 (greater than 50%), and CD56 (subset). Cytokeratin 20, TTF-1,  chromogranin, synaptophysin, and CDX-2 are negative. In the absence of  chromogranin / synaptophysin staining the presence of patchy CD56 staining is interpreted as non-specific. The pattern of immunoreactivity is consistent with metastatic poorly differentiated squamous cell carcinoma.     12/17/2023 Cancer Staging   Staging form: Lung, AJCC V9 - Clinical stage from 12/17/2023: Tony Mitchell, pM1c - Signed by [name], MD on 12/17/2023 Histopathologic type: Squamous cell carcinoma, NOS Stage prefix: Initial diagnosis Laterality: Left Sites of metastasis: Bone, Liver, Brain   12/23/2023 - 12/25/2023 Chemotherapy   Patient is on Treatment Plan : LUNG Carboplatin  + Paclitaxel  q21d     01/14/2024 -  Chemotherapy   Patient is on Treatment Plan : LUNG NSCLC Carboplatin  (6) + Paclitaxel  (200) + Pembrolizumab (200) D1 q21d x 4 cycles / Pembrolizumab (200) Maintenance D1 q21d       Patient denies changes in bowel habits.  Denies unintentional weight loss night sweats or fever. No history of blood transfusions or hepatitis. He is not on any blood thinners but takes aspirin  for preventive purposes.  He has a history of  stage 3 renal disease diagnosed three years ago after a blood test revealed elevated potassium levels. Since then, he has made significant lifestyle changes, including stopping alcohol consumption and losing weight. He now maintains a weight between 145 and 151 pounds and walks approximately 5.6 miles daily.  He has a history of skin cancer, treated with resection, and is scheduled for further treatment. His father had prostate cancer.  Patient had a normal PSA in January 2025.   Patient lives at home with wife who has multiple medical problems.  He is the caregiver for her. His daughter passed away last year.  He has granddaughter who lives in [place] .  He denies SOB, chest pain, hemoptysis.  Former smoker.  Abdominal pain has improved and he did not need to take any pain meds.   INTERVAL HISTORY Tony Mitchell is a 83 y.o. male who has above history reviewed by me today presents for follow up visit for treatment of metastatic lung cancer.  He reports feeling ok today. After chemotherapy he feels his "equilibrium is off" for a while. No falls or focal weakness. Appetite is good. No nausea vomiting diarrhea He take Dexamethasone  4mg  BID.     MEDICAL HISTORY:  Past Medical History:  Diagnosis Date   Acute appendicitis 03/03/2018   Arthritis  Cancer (HCC)    skin cancer   Chronic kidney disease, stage 3b (HCC)    Collapsed lung 2019   x2   COPD (chronic obstructive pulmonary disease) (HCC)    Coronary artery disease    Diabetes mellitus without complication (HCC)    diet controlled-lost 60 lbs and is not on any current meds   GERD (gastroesophageal reflux disease)    Gout    Hyperlipidemia    Hypertension    Sleep apnea    uses cpap   Thoracic aortic aneurysm (HCC)    Vitamin B 12 deficiency     SURGICAL HISTORY: Past Surgical History:  Procedure Laterality Date   CARDIAC CATHETERIZATION     CATARACT EXTRACTION  2012   CHEST TUBE INSERTION     x2   COLONOSCOPY WITH  PROPOFOL  N/A 04/14/2021   Procedure: COLONOSCOPY WITH PROPOFOL ;  Surgeon: Shane Darling, MD;  Location: [facility] ENDOSCOPY;  Service: Endoscopy;  Laterality: N/A;   ESOPHAGOGASTRODUODENOSCOPY (EGD) WITH PROPOFOL  N/A 04/14/2021   Procedure: ESOPHAGOGASTRODUODENOSCOPY (EGD) WITH PROPOFOL ;  Surgeon: Shane Darling, MD;  Location: [facility] ENDOSCOPY;  Service: Endoscopy;  Laterality: N/A;   INSERTION OF MESH  09/22/2021   Procedure: INSERTION OF MESH;  Surgeon: Conrado Delay, DO;  Location: [facility] ORS;  Service: General;;   JOINT REPLACEMENT     KNEE SURGERY Left 1965   LAPAROSCOPIC APPENDECTOMY N/A 03/03/2018   Procedure: APPENDECTOMY LAPAROSCOPIC;  Surgeon: Franki Isles, MD;  Location: [facility] ORS;  Service: General;  Laterality: N/A;   TOTAL KNEE ARTHROPLASTY Left 09/16/2018   Procedure: TOTAL KNEE ARTHROPLASTY;  Surgeon: Elner Hahn, MD;  Location: [facility] ORS;  Service: Orthopedics;  Laterality: Left;    SOCIAL HISTORY: Social History   Socioeconomic History   Marital status: Married    Spouse name: Not on file   Number of children: Not on file   Years of education: Not on file   Highest education level: Not on file  Occupational History   Not on file  Tobacco Use   Smoking status: Former    Current packs/day: 0.00    Average packs/day: 1 pack/day for 40.0 years (40.0 ttl pk-yrs)    Types: Cigarettes    Start date: 06/15/1956    Quit date: 06/15/1996    Years since quitting: 27.5    Passive exposure: Past   Smokeless tobacco: Never  Vaping Use   Vaping status: Never Used  Substance and Sexual Activity   Alcohol use: Not Currently    Alcohol/week: 21.0 standard drinks of alcohol    Types: 21 Shots of liquor per week    Comment: reports last drink 11/03/20   Drug use: No   Sexual activity: Yes  Other Topics Concern   Not on file  Social History Narrative   Not on file   Social Drivers of Health   Financial Resource Strain: Low Risk  (10/30/2023)   Received from [facility]   Overall Financial Resource Strain (CARDIA)    Difficulty of Paying Living Expenses: Not hard at all  Food Insecurity: No Food Insecurity (10/30/2023)   Received from [facility]   Hunger Vital Sign    Worried About Running Out of Food in the Last Year: Never true    Ran Out of Food in the Last Year: Never true  Transportation Needs: No Transportation Needs (10/30/2023)   [facility]    In the past  12 months, has lack of transportation kept you from medical appointments or from getting medications?: No    Lack of Transportation (Non-Medical): No  Physical Activity: Sufficiently Active (07/10/2017)   Received from [facility], [facility]   Exercise Vital Sign    Days of Exercise per Week: 5 days    Minutes of Exercise per Session: 60 min  Stress: Stress Concern Present (07/10/2017)   Received from [facility], [facility]   [employer] of Occupational Health - Occupational Stress Questionnaire    Feeling of Stress : Rather much  Social Connections: Unknown (07/10/2017)   Received from [facility], [facility]   Social Connection and Isolation Panel [NHANES]    Frequency of Communication with Friends and Family: Patient declined    Frequency of Social Gatherings with Friends and Family: Patient declined    Attends Religious Services: Patient declined    Active Member of Clubs or Organizations: Patient declined    Attends Engineer, structural: Patient declined    Marital Status: Patient declined  Catering manager Violence: Not on file    FAMILY HISTORY: Family History  Problem Relation Age of Onset   Cancer Father        Unknown   Prostate cancer Father    Lung cancer Neg Hx     ALLERGIES:  has no known allergies.  MEDICATIONS:  Current Outpatient  Medications  Medication Sig Dispense Refill   allopurinol  (ZYLOPRIM ) 100 MG tablet Take 100 mg by mouth in the morning and at bedtime.     ALPRAZolam  (XANAX ) 0.5 MG tablet Take 0.5 mg by mouth at bedtime.     amLODipine  (NORVASC ) 2.5 MG tablet Take 2.5 mg by mouth in the morning.     dexamethasone  (DECADRON ) 4 MG tablet Take 1 tablet (4 mg total) by mouth 2 (two) times daily. 30 tablet 0   fenofibrate  (TRICOR ) 145 MG tablet Take 145 mg by mouth 2 (two) times a week. On Wednesday and Sunday     finasteride  (PROSCAR ) 5 MG tablet Take 1 tablet (5 mg total) by mouth daily. 90 tablet 3   gabapentin  (NEURONTIN ) 100 MG capsule Take 100 mg by mouth.     hydrALAZINE  (APRESOLINE ) 25 MG tablet Take 25 mg by mouth 2 (two) times daily.     lansoprazole (PREVACID) 15 MG capsule Take 15 mg by mouth every evening.     oxyCODONE  (OXY IR/ROXICODONE ) 5 MG immediate release tablet Take 1 tablet (5 mg total) by mouth every 6 (six) hours as needed for severe pain (pain score 7-10) or breakthrough pain. 10 tablet 0   simvastatin  (ZOCOR ) 20 MG tablet Take 1 tablet by mouth 2 (two) times a week. On  Wednesday and Sunday     tamsulosin  (FLOMAX ) 0.4 MG CAPS capsule Take 1 capsule (0.4 mg total) by mouth daily. 30 capsule 11   vitamin B-12 (CYANOCOBALAMIN ) 1000 MCG tablet Take 1,000 mcg by mouth every morning.     Vitamin D, Ergocalciferol, (DRISDOL) 1.25 MG (50000 UNIT) CAPS capsule Take 50,000 Units by mouth every 7 (seven) days.     colchicine 0.6 MG tablet Take 2 tablets (1.2mg ) by mouth at first sign of gout flare followed by 1 tablet (0.6mg ) after 1 hour. (Max 1.8mg  within 1 hour) (Patient not taking: Reported on 12/12/2023)     triamcinolone  ointment (KENALOG ) 0.5 % Apply 1 Application topically 2 (two) times daily. (Patient not taking: Reported  on 12/12/2023) 30 g 0   No current facility-administered medications for this visit.    Review of Systems  Constitutional:  Negative for appetite change, chills, fatigue,  fever and unexpected weight change.  HENT:   Negative for hearing loss and voice change.   Eyes:  Negative for eye problems and icterus.  Respiratory:  Negative for chest tightness, cough and shortness of breath.   Cardiovascular:  Negative for chest pain and leg swelling.  Gastrointestinal:  Negative for abdominal distention and abdominal pain.       Bloating  Endocrine: Negative for hot flashes.  Genitourinary:  Negative for difficulty urinating, dysuria and frequency.   Musculoskeletal:  Negative for arthralgias.  Skin:  Negative for itching and rash.  Neurological:  Negative for light-headedness and numbness.  Hematological:  Negative for adenopathy. Does not bruise/bleed easily.  Psychiatric/Behavioral:  Negative for confusion.      PHYSICAL EXAMINATION: ECOG PERFORMANCE STATUS: 1 - Symptomatic but completely ambulatory  Vitals:   12/30/23 0831  BP: 137/64  Pulse: 62  Resp: 20  Temp: (!) 97.3 F (36.3 C)  SpO2: 100%   Filed Weights   12/30/23 0831  Weight: 139 lb 6.4 oz (63.2 kg)    Physical Exam Constitutional:      General: He is not in acute distress.    Appearance: He is not diaphoretic.  HENT:     Head: Normocephalic and atraumatic.     Nose: Nose normal.  Eyes:     General: No scleral icterus. Cardiovascular:     Rate and Rhythm: Normal rate and regular rhythm.     Heart sounds: No murmur heard. Pulmonary:     Effort: Pulmonary effort is normal. No respiratory distress.  Abdominal:     General: There is no distension.     Palpations: Abdomen is soft. There is mass.     Tenderness: There is no abdominal tenderness.  Musculoskeletal:        General: Normal range of motion.     Cervical back: Normal range of motion and neck supple.  Skin:    General: Skin is warm and dry.     Findings: No erythema.  Neurological:     Mental Status: He is alert and oriented to person, place, and time. Mental status is at baseline.     Cranial Nerves: No cranial nerve  deficit.     Motor: No abnormal muscle tone.     Coordination: Coordination normal.  Psychiatric:        Mood and Affect: Mood and affect normal.      LABORATORY DATA:  I have reviewed the data as listed    Latest Ref Rng & Units 12/30/2023    7:52 AM 12/23/2023    8:09 AM 12/12/2023   10:15 AM  CBC  WBC 4.0 - 10.5 K/uL 18.9  10.4  6.0   Hemoglobin 13.0 - 17.0 g/dL 32.9  51.8  84.1   Hematocrit 39.0 - 52.0 % 40.4  42.3  39.9   Platelets 150 - 400 K/uL 110  278  289       Latest Ref Rng & Units 12/30/2023    7:51 AM 12/23/2023    8:09 AM 12/12/2023   10:15 AM  CMP  Glucose 70 - 99 mg/dL 97  660  96   BUN 8 - 23 mg/dL 35  40  24   Creatinine 0.61 - 1.24 mg/dL 6.30  1.60  1.09   Sodium 135 - 145 mmol/L  131  132  139   Potassium 3.5 - 5.1 mmol/L 5.0  4.4  3.8   Chloride 98 - 111 mmol/L 98  97  105   CO2 22 - 32 mmol/L 25  24  27    Calcium 8.9 - 10.3 mg/dL 8.7  8.6  9.2   Total Protein 6.5 - 8.1 g/dL 5.6  6.2    Total Bilirubin 0.0 - 1.2 mg/dL 1.1  1.0    Alkaline Phos 38 - 126 U/L 407  353    AST 15 - 41 U/L 133  119    ALT 0 - 44 U/L 57  54       RADIOGRAPHIC STUDIES: I have personally reviewed the radiological images as listed and agreed with the findings in the report. MR Brain W Wo Contrast Result Date: 12/18/2023 CLINICAL DATA:  Metastatic disease evaluation.  SRS planning. EXAM: MRI HEAD WITHOUT AND WITH CONTRAST TECHNIQUE: Multiplanar, multiecho pulse sequences of the brain and surrounding structures were obtained without and with intravenous contrast. CONTRAST:  6mL GADAVIST  GADOBUTROL  1 MMOL/ML IV SOLN COMPARISON:  12/10/2023 FINDINGS: Brain: Redemonstration of a 10 mm centrally necrotic metastasis at the left frontoparietal vertex with mild surrounding edema. No second lesion identified. No abnormality of the brainstem or cerebellum. Cerebral hemispheres otherwise show mild chronic small-vessel ischemic change of the white matter, less than often seen at this age. No  hydrocephalus or extra-axial collection. Vascular: Major vessels at the base of the brain show flow. Skull and upper cervical spine: Negative Sinuses/Orbits: Clear/normal Other: None IMPRESSION: Redemonstration of a 10 mm centrally necrotic metastasis at the left frontoparietal vertex with mild surrounding edema. No second lesion. Electronically Signed   By: Bettylou Brunner M.D.   On: 12/18/2023 13:45   US  BIOPSY (LIVER) Result Date: 12/12/2023 INDICATION: 83 year old with concern for metastatic disease. Patient has numerous liver lesions and needs a tissue diagnosis. EXAM: ULTRASOUND-GUIDED LIVER LESION BIOPSY MEDICATIONS: Moderate sedation ANESTHESIA/SEDATION: Moderate (conscious) sedation was employed during this procedure. A total of Versed  1 mg and Fentanyl  50 mcg was administered intravenously by the radiology nurse. Total intra-service moderate Sedation Time: 18 minutes. The patient's level of consciousness and vital signs were monitored continuously by radiology nursing throughout the procedure under my direct supervision. FLUOROSCOPY TIME:  None COMPLICATIONS: None immediate. PROCEDURE: Informed written consent was obtained from the patient after a thorough discussion of the procedural risks, benefits and alternatives. All questions were addressed. Maximal Sterile Barrier Technique was utilized including caps, mask, sterile gowns, sterile gloves, sterile drape, hand hygiene and skin antiseptic. A timeout was performed prior to the initiation of the procedure. Liver was evaluated with ultrasound. Numerous hepatic lesions were identified. The right anterior abdomen was prepped with chlorhexidine  and sterile field was created. Skin was anesthetized using 1% lidocaine . Small incision was made. Using ultrasound guidance, 17 gauge coaxial needle was directed into a right hepatic lesion. Total of 5 core biopsies were performed with an 18 gauge core device. Adequate specimens obtained and placed in formalin.  Gel-Foam slurry was injected as the 17 gauge needle was removed. Bandage placed over the puncture site. FINDINGS: Innumerable hepatic lesions. Many of hepatic lesions are partially cystic and necrotic. A hyperechoic lesion that appeared to be mostly solid in the right hepatic lobe was targeted. Biopsy needle confirmed within the lesion. No immediate bleeding or hematoma formation after the biopsy. IMPRESSION: Ultrasound-guided core biopsy of a right hepatic lesion. Electronically Signed   By: Elene Griffes M.D.   On:  12/12/2023 13:33   MR Brain W Wo Contrast Result Date: 12/12/2023 CLINICAL DATA:  Liver lesion.  Diffuse metastatic disease. EXAM: MRI HEAD WITHOUT AND WITH CONTRAST TECHNIQUE: Multiplanar, multiecho pulse sequences of the brain and surrounding structures were obtained without and with intravenous contrast. CONTRAST:  7mL GADAVIST  GADOBUTROL  1 MMOL/ML IV SOLN COMPARISON:  None Available. FINDINGS: Brain: A rounded lesion in the posterior left frontal lobe measures 10 x 9 x 10 mm on image 132 of series 16. Surrounding vasogenic edema is present. No other pathologic enhancement is present. No acute infarct or hemorrhage is present. Periventricular and scattered subcortical T2 hyperintensities are mildly advanced for age. Deep brain nuclei are within normal limits. The ventricles are of normal size. No significant extraaxial fluid collection is present. The brainstem and cerebellum are within normal limits. The internal auditory canals are within normal limits. Midline structures are within normal limits. Vascular: Insert normal flow Skull and upper cervical spine: The craniocervical junction is normal. Upper cervical spine is within normal limits. Marrow signal is unremarkable. Sinuses/Orbits: The paranasal sinuses and mastoid air cells are clear. Bilateral lens replacements are noted. Globes and orbits are otherwise unremarkable. IMPRESSION: 1. 10 x 9 x 10 mm rounded lesion in the posterior left frontal  lobe with surrounding vasogenic edema is consistent with metastatic disease. 2. No other pathologic enhancement is present. 3. Periventricular and scattered subcortical T2 hyperintensities bilaterally are mildly advanced for age. The finding is nonspecific but can be seen in the setting of chronic microvascular ischemia, a demyelinating process such as multiple sclerosis, vasculitis, complicated migraine headaches, or as the sequelae of a prior infectious or inflammatory process. Electronically Signed   By: Audree Leas M.D.   On: 12/12/2023 13:32   NM PET Image Initial (PI) Skull Base To Thigh Result Date: 12/04/2023 CLINICAL DATA:  Initial treatment strategy for metastatic disease. Elevated tumor marker chromogranin A. EXAM: NUCLEAR MEDICINE PET SKULL BASE TO THIGH TECHNIQUE: 7.97 mCi F-18 FDG was injected intravenously. Full-ring PET imaging was performed from the skull base to thigh after the radiotracer. CT data was obtained and used for attenuation correction and anatomic localization. Fasting blood glucose: 75 mg/dl COMPARISON:  Abdomen pelvis CT 11/22/2023 and older. Chest x-ray 11/13/2022. FINDINGS: Mediastinal blood pool activity: SUV max 2.0 NECK: No specific abnormal radiotracer uptake seen in the neck including along lymph node change of the submandibular, posterior triangle or internal jugular region. There is symmetric uptake seen of the visualized intracranial compartment. Incidental CT findings: Visualized portions of the paranasal sinuses and mastoid air cells are clear. Vascular calcifications are seen. The parotid glands, submandibular glands and thyroid gland are unremarkable. CHEST: There is hypermetabolic infiltrative appearing mass in the superior segment of the left lower lobe with maximum SUV value of 19.1. There are some spiculations identified. The tumor extends from the hilum of the left lower lobe out to the pleura laterally. Lesion on series 6, image 61 measures 3.7 by 2.6  and appearance. This not include the component extending more medial to the hilum. There is confluent abnormal uptake in tissue involving the hilum itself, likely abnormal nodes. Abnormal nodes are also seen extending left paratracheal. The hilar area has maximum SUV approaching 11.5. The left paratracheal lesion has maximum SUV value of 12.7 and dimension on CT image 53 of 3.4 by 2.4 cm. Additional left paratracheal nodes extending more superiorly. The most superior hypermetabolic node is identified just deep to the left subclavian artery on CT image 44 measuring 6 mm  in short axis but has maximum SUV value of 8.6. Additional caudal abnormal node subcarinal on the left. No specific hypermetabolic nodes identified in the axillary regions or right hilum. On the PET image there is an area of uptake along the right extreme lung base. There is no nodule in this location. This could be related to misregistration with a dome liver lesion. Incidental CT findings: Mild linear opacity at the lung bases likely scar or atelectasis. No pleural effusion or pneumothorax. Prominent coronary artery calcifications are seen. No pericardial effusion. Diffuse vascular calcifications along the aorta. 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 At least 20 lesions are identified. There are several areas which are confluent in 2 larger measurable lesions. Specific examples would include lesion left hepatic lobe on the previous examination measuring 4.3 x 3.8 cm. This is seen posteriorly along segment 3 and today has maximum SUV value of 15.9. The confluent area of multiple lesions along the inferior right hepatic lobe that measured on the prior contrast CT 89.3 x 5.1 cm, today would have an overall maximum SUV value of 18.2. No other specific areas of abnormal uptake identified along the parenchymal organs. No abnormal uptake along bowel or renal collecting systems. There is only trace uptake along the left  adrenal gland, nonspecific. There are some abnormal hypermetabolic upper abdominal nodes. Dominant focus is seen portacaval with maximum SUV 18.3 and this lesion on series 6, image 93 measures 3.1 by 1.9 cm. Small area of abnormal uptake maximum SUV value 3.0 along a left para-node in the upper retroperitoneum measuring 7 mm on image 90. This is medial to the left adrenal gland just superior. Additional Incidental CT findings: Diffuse vascular calcifications are identified. Mild renal atrophy. No obstructing renal or ureteral stones. The bowel is nondilated with diffuse colonic stool. There is a left inguinal hernia involving portions of the sigmoid colon without obstruction or dilatation. Slight wall thickening of the non dilated underdistended stomach. The prior CT report describes an area of thickening along the antrum of the stomach. This is unchanged in appearance by CT but there is no abnormal uptake. This could be simply underdistended. SKELETON: There is multifocal hypermetabolic osseous metastatic disease. These include along the spine at L5, L4, L3 but also largest have the vertebral body of T5 with maximum SUV value of 14.9. There are foci seen along the right humeral head, left scapula, right iliac crest, left iliac crest and both upper lateral portions of the sacrum. The right iliac crest lesion anteriorly has some mild mixed lucent and sclerotic foci on CT and has maximum SUV value of 13.7. Incidental CT findings: Scattered degenerative changes seen of the spine and pelvis. Osteopenia. IMPRESSION: Extensive hypermetabolic neoplastic disease including left lower lobe infiltrative mass extending from the pleura to the hilum with numerous abnormal left-sided hilar and mediastinal nodes. Additional upper retroperitoneal nodes as well as extensive liver metastases. Multifocal osseous metastatic disease including the thoracic and lumbar spine, pelvis, right humeral head and left scapula. Based on the overall  appearance and with the patient's laboratory a abnormality this very well could be a primary left lower lobe lung lesion such as small cell versus a neuroendocrine tumor with diffuse metastatic disease. Electronically Signed   By: Adrianna Horde M.D.   On: 12/04/2023 20:30

## 2023-12-31 NOTE — Radiation Completion Notes (Signed)
 Patient Name: Tony Mitchell, Tony Mitchell MRN: [id number] Date of Birth: [birth year] Referring Physician: Antonio Baumgarten, M.D. Date of Service: 2023-12-31 Radiation Oncologist: Glenis Langdon, M.D. 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  Initial CT scan showed multiple hepatic lesions compatible with metastatic disease.  He also had a serosal implant along the distal stomach.  PET CT scan demonstrated widespread metastatic disease including a left lower lobe infiltrate consistent with primary lung cancer extending from the pleura to the hilum with numerous abnormal left sided hilar and mediastinal nodes.  Extensive liver metastasis as well as multifocal osseous metastatic disease including thoracic lumbar spine pelvis right humeral head and left scapula.  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  He had an MRI of his brain which showed a 10 x 9 x 10 mm rounded lesion the posterior left frontal lobe with surrounding vasogenic edema consistent with metastatic disease.  No other pathologic enhancement was noted.  He is asymptomatic.  Specifically denies any change in visual field any focal neurologic deficits.  He is having very little symptoms as far as his lungs are concerned although does occasionally have some slight cough on deep inspiration.  He is having no bone pain.  I been asked to evaluate him for consideration of radiation therapy for his solitary brain metastasis.      ==========DELIVERED PLANS==========  First Treatment Date: 2023-12-30 Last Treatment Date: 2023-12-30   Plan Name: Brain_SRS Site: Brain Technique:  SBRT/SRT-IMRT Mode: Photon Dose Per Fraction: 20 Gy Prescribed Dose (Delivered / Prescribed): 20 Gy / 20 Gy Prescribed Fxs (Delivered / Prescribed): 1 / 1     ==========ON TREATMENT VISIT DATES========== 2023-12-30, 2023-12-30, 2023-12-30, 2023-12-30     ==========UPCOMING VISITS==========       ==========APPENDIX - ON TREATMENT VISIT NOTES==========   See weekly On Treatment Notes in Epic for details in the Media tab (listed as Progress notes on the On Treatment Visit Dates listed above).

## 2023-12-31 NOTE — Progress Notes (Signed)
 Pharmacist Chemotherapy Monitoring - Initial Assessment    Anticipated start date: 01/14/24  The following has been reviewed per standard work regarding the patient's treatment regimen: The patient's diagnosis, treatment plan and drug doses, and organ/hematologic function Lab orders and baseline tests specific to treatment regimen  The treatment plan start date, drug sequencing, and pre-medications Prior authorization status  Patient's documented medication list, including drug-drug interaction screen and prescriptions for anti-emetics and supportive care specific to the treatment regimen The drug concentrations, fluid compatibility, administration routes, and timing of the medications to be used The patient's access for treatment and lifetime cumulative dose history, if applicable  The patient's medication allergies and previous infusion related reactions, if applicable   Changes made to treatment plan:    Follow up needed:     Tony Mitchell, [facility], 12/31/2023  12:53 PM

## 2024-01-01 ENCOUNTER — Inpatient Hospital Stay: Admitting: Licensed Clinical Social Worker

## 2024-01-01 ENCOUNTER — Encounter: Payer: Self-pay | Admitting: Internal Medicine

## 2024-01-01 NOTE — Progress Notes (Signed)
 [facility] CSW Progress Note  Clinical Child psychotherapist contacted patient by phone to offer active listening and to provide supportive counseling.  Patient expressed recent challenges.  Validated his feelings.  He expressed no needs at this time.Tony Peal, LCSW Clinical Social Worker [facility]

## 2024-01-01 NOTE — Progress Notes (Signed)
 Patient called for mild diarrhea.  Recommend Imodium as needed.  Patient to call back if symptoms worse.

## 2024-01-06 ENCOUNTER — Telehealth: Payer: Self-pay | Admitting: *Deleted

## 2024-01-06 NOTE — Telephone Encounter (Signed)
 Pt has an appt with Dr. Daun Epstein, orthopedic at [facility], for steroid injections in his knee on 6/11. Pt wants to make sure it is okay with Dr. Wilhelmenia Harada if he continues to get these injections.  Please advise.

## 2024-01-07 ENCOUNTER — Encounter: Payer: Self-pay | Admitting: Oncology

## 2024-01-07 NOTE — Telephone Encounter (Signed)
 Pt has been made aware of MD recommendations. Verbalized understanding.

## 2024-01-09 ENCOUNTER — Other Ambulatory Visit: Payer: Self-pay

## 2024-01-09 DIAGNOSIS — C3492 Malignant neoplasm of unspecified part of left bronchus or lung: Secondary | ICD-10-CM

## 2024-01-09 MED ORDER — PROCHLORPERAZINE MALEATE 10 MG PO TABS: 10.0000 mg | ORAL_TABLET | Freq: Four times a day (QID) | ORAL | 1 refills | Status: DC | PRN

## 2024-01-14 ENCOUNTER — Inpatient Hospital Stay

## 2024-01-14 ENCOUNTER — Ambulatory Visit: Admitting: Oncology

## 2024-01-14 ENCOUNTER — Inpatient Hospital Stay (HOSPITAL_BASED_OUTPATIENT_CLINIC_OR_DEPARTMENT_OTHER): Admitting: Oncology

## 2024-01-14 ENCOUNTER — Encounter: Payer: Self-pay | Admitting: Oncology

## 2024-01-14 ENCOUNTER — Encounter: Payer: Self-pay | Admitting: *Deleted

## 2024-01-14 ENCOUNTER — Other Ambulatory Visit: Payer: Self-pay

## 2024-01-14 ENCOUNTER — Ambulatory Visit: Admission: RE | Admit: 2024-01-14 | Source: Ambulatory Visit

## 2024-01-14 ENCOUNTER — Other Ambulatory Visit

## 2024-01-14 ENCOUNTER — Ambulatory Visit

## 2024-01-14 VITALS — BP 152/67 | HR 86 | Temp 98.0°F | Resp 16 | Wt 151.5 lb

## 2024-01-14 VITALS — BP 122/58 | HR 77 | Resp 16

## 2024-01-14 DIAGNOSIS — I712 Thoracic aortic aneurysm, without rupture, unspecified: Secondary | ICD-10-CM | POA: Diagnosis not present

## 2024-01-14 DIAGNOSIS — Z923 Personal history of irradiation: Secondary | ICD-10-CM | POA: Diagnosis not present

## 2024-01-14 DIAGNOSIS — C3492 Malignant neoplasm of unspecified part of left bronchus or lung: Secondary | ICD-10-CM

## 2024-01-14 DIAGNOSIS — N1832 Chronic kidney disease, stage 3b: Secondary | ICD-10-CM | POA: Diagnosis not present

## 2024-01-14 DIAGNOSIS — G4730 Sleep apnea, unspecified: Secondary | ICD-10-CM | POA: Diagnosis not present

## 2024-01-14 DIAGNOSIS — C7931 Secondary malignant neoplasm of brain: Secondary | ICD-10-CM | POA: Diagnosis not present

## 2024-01-14 DIAGNOSIS — Z510 Encounter for antineoplastic radiation therapy: Secondary | ICD-10-CM | POA: Diagnosis not present

## 2024-01-14 DIAGNOSIS — R748 Abnormal levels of other serum enzymes: Secondary | ICD-10-CM | POA: Diagnosis not present

## 2024-01-14 DIAGNOSIS — E785 Hyperlipidemia, unspecified: Secondary | ICD-10-CM | POA: Diagnosis not present

## 2024-01-14 DIAGNOSIS — R600 Localized edema: Secondary | ICD-10-CM | POA: Insufficient documentation

## 2024-01-14 DIAGNOSIS — C787 Secondary malignant neoplasm of liver and intrahepatic bile duct: Secondary | ICD-10-CM | POA: Diagnosis not present

## 2024-01-14 DIAGNOSIS — E119 Type 2 diabetes mellitus without complications: Secondary | ICD-10-CM | POA: Diagnosis not present

## 2024-01-14 DIAGNOSIS — J449 Chronic obstructive pulmonary disease, unspecified: Secondary | ICD-10-CM | POA: Diagnosis not present

## 2024-01-14 DIAGNOSIS — Z5111 Encounter for antineoplastic chemotherapy: Secondary | ICD-10-CM | POA: Diagnosis not present

## 2024-01-14 DIAGNOSIS — C7951 Secondary malignant neoplasm of bone: Secondary | ICD-10-CM | POA: Diagnosis not present

## 2024-01-14 DIAGNOSIS — I2510 Atherosclerotic heart disease of native coronary artery without angina pectoris: Secondary | ICD-10-CM | POA: Diagnosis not present

## 2024-01-14 DIAGNOSIS — R634 Abnormal weight loss: Secondary | ICD-10-CM | POA: Diagnosis not present

## 2024-01-14 DIAGNOSIS — C3432 Malignant neoplasm of lower lobe, left bronchus or lung: Secondary | ICD-10-CM | POA: Diagnosis not present

## 2024-01-14 DIAGNOSIS — I129 Hypertensive chronic kidney disease with stage 1 through stage 4 chronic kidney disease, or unspecified chronic kidney disease: Secondary | ICD-10-CM | POA: Diagnosis not present

## 2024-01-14 DIAGNOSIS — E538 Deficiency of other specified B group vitamins: Secondary | ICD-10-CM | POA: Diagnosis not present

## 2024-01-14 LAB — CBC WITH DIFFERENTIAL (CANCER CENTER ONLY)
Abs Immature Granulocytes: 0.19 10*3/uL — ABNORMAL HIGH (ref 0.00–0.07)
Basophils Absolute: 0.1 10*3/uL (ref 0.0–0.1)
Basophils Relative: 1 %
Eosinophils Absolute: 0.1 10*3/uL (ref 0.0–0.5)
Eosinophils Relative: 1 %
HCT: 36.2 % — ABNORMAL LOW (ref 39.0–52.0)
Hemoglobin: 12.2 g/dL — ABNORMAL LOW (ref 13.0–17.0)
Immature Granulocytes: 2 %
Lymphocytes Relative: 13 %
Lymphs Abs: 1.5 10*3/uL (ref 0.7–4.0)
MCH: 29.5 pg (ref 26.0–34.0)
MCHC: 33.7 g/dL (ref 30.0–36.0)
MCV: 87.4 fL (ref 80.0–100.0)
Monocytes Absolute: 1 10*3/uL (ref 0.1–1.0)
Monocytes Relative: 9 %
Neutro Abs: 8.3 10*3/uL — ABNORMAL HIGH (ref 1.7–7.7)
Neutrophils Relative %: 74 %
Platelet Count: 274 10*3/uL (ref 150–400)
RBC: 4.14 MIL/uL — ABNORMAL LOW (ref 4.22–5.81)
RDW: 14.4 % (ref 11.5–15.5)
WBC Count: 11.1 10*3/uL — ABNORMAL HIGH (ref 4.0–10.5)
nRBC: 0 % (ref 0.0–0.2)

## 2024-01-14 LAB — CMP (CANCER CENTER ONLY)
ALT: 34 U/L (ref 0–44)
AST: 60 U/L — ABNORMAL HIGH (ref 15–41)
Albumin: 3.4 g/dL — ABNORMAL LOW (ref 3.5–5.0)
Alkaline Phosphatase: 231 U/L — ABNORMAL HIGH (ref 38–126)
Anion gap: 7 (ref 5–15)
BUN: 24 mg/dL — ABNORMAL HIGH (ref 8–23)
CO2: 26 mmol/L (ref 22–32)
Calcium: 8.6 mg/dL — ABNORMAL LOW (ref 8.9–10.3)
Chloride: 99 mmol/L (ref 98–111)
Creatinine: 1.04 mg/dL (ref 0.61–1.24)
GFR, Estimated: >60 mL/min (ref >60)
Glucose, Bld: 135 mg/dL — ABNORMAL HIGH (ref 70–99)
Potassium: 4.2 mmol/L (ref 3.5–5.1)
Sodium: 132 mmol/L — ABNORMAL LOW (ref 135–145)
Total Bilirubin: 0.7 mg/dL (ref 0.0–1.2)
Total Protein: 5.9 g/dL — ABNORMAL LOW (ref 6.5–8.1)

## 2024-01-14 LAB — BRAIN NATRIURETIC PEPTIDE: B Natriuretic Peptide: 35.9 pg/mL (ref 0.0–100.0)

## 2024-01-14 LAB — TSH: TSH: 1.05 u[IU]/mL (ref 0.350–4.500)

## 2024-01-14 MED ORDER — DIPHENHYDRAMINE HCL 50 MG/ML IJ SOLN
50.0000 mg | Freq: Once | INTRAMUSCULAR | Status: AC
  Administered 2024-01-14: 50 mg via INTRAVENOUS
  Filled 2024-01-14: qty 1

## 2024-01-14 MED ORDER — FAMOTIDINE IN NACL 20-0.9 MG/50ML-% IV SOLN
20.0000 mg | Freq: Once | INTRAVENOUS | Status: AC
  Administered 2024-01-14: 20 mg via INTRAVENOUS
  Filled 2024-01-14: qty 50

## 2024-01-14 MED ORDER — APREPITANT 130 MG/18ML IV EMUL
130.0000 mg | Freq: Once | INTRAVENOUS | Status: AC
  Administered 2024-01-14: 130 mg via INTRAVENOUS
  Filled 2024-01-14: qty 18

## 2024-01-14 MED ORDER — PALONOSETRON HCL INJECTION 0.25 MG/5ML
0.2500 mg | Freq: Once | INTRAVENOUS | Status: AC
  Administered 2024-01-14: 0.25 mg via INTRAVENOUS
  Filled 2024-01-14: qty 5

## 2024-01-14 MED ORDER — SODIUM CHLORIDE 0.9 % IV SOLN
INTRAVENOUS | Status: DC
  Filled 2024-01-14: qty 250

## 2024-01-14 MED ORDER — DEXAMETHASONE SODIUM PHOSPHATE 10 MG/ML IJ SOLN
10.0000 mg | Freq: Once | INTRAMUSCULAR | Status: AC
  Administered 2024-01-14: 10 mg via INTRAVENOUS
  Filled 2024-01-14: qty 1

## 2024-01-14 MED ORDER — SODIUM CHLORIDE 0.9 % IV SOLN
370.0000 mg | Freq: Once | INTRAVENOUS | Status: AC
  Administered 2024-01-14: 370 mg via INTRAVENOUS
  Filled 2024-01-14: qty 37

## 2024-01-14 MED ORDER — PEMBROLIZUMAB CHEMO INJECTION 100 MG/4ML
200.0000 mg | Freq: Once | INTRAVENOUS | Status: AC
  Administered 2024-01-14: 200 mg via INTRAVENOUS
  Filled 2024-01-14: qty 8

## 2024-01-14 MED ORDER — SODIUM CHLORIDE 0.9 % IV SOLN
150.0000 mg/m2 | Freq: Once | INTRAVENOUS | Status: AC
  Administered 2024-01-14: 264 mg via INTRAVENOUS
  Filled 2024-01-14: qty 44

## 2024-01-14 NOTE — Assessment & Plan Note (Signed)
Follow up with nutritionist.  

## 2024-01-14 NOTE — Assessment & Plan Note (Addendum)
 He has finished tapering course of Dexamethasone    S/p Brain Radiation.  Refer to neurology

## 2024-01-14 NOTE — Assessment & Plan Note (Addendum)
 Imaging results and pathology results were reviewed and discussed with patient. 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 NGS showed TMB 53.2 high, PD-L1 <1%, SMARCB1- high TMB, will add Keytruda Labs are reviewed and discussed with patient.' Proceed with cycle 2 Carboplatin  + Taxol  with D3 GCSF + add Keytruda Rationale and side effects were reviewed with patient and he agrees.

## 2024-01-14 NOTE — Progress Notes (Signed)
 Hematology/Oncology Progress note [phone] [phone]     CHIEF COMPLAINTS/PURPOSE OF CONSULTATION:  Metastatic squamous cell carcinoma  ASSESSMENT & PLAN:   Cancer Staging  Squamous cell carcinoma lung, left (HCC) Staging form: Lung, AJCC V9 - Clinical stage from 12/17/2023: Tony Mitchell, pM1c - Signed by Tony Forbes, MD on 12/17/2023   Squamous cell carcinoma lung, left [facility]) Imaging results and pathology results were reviewed and discussed with patient. 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 NGS showed TMB 53.2 high, PD-L1 <1%, SMARCB1- high TMB, will add Keytruda Labs are reviewed and discussed with patient.' Proceed with cycle 2 Carboplatin  + Taxol  with D3 GCSF + add Keytruda Rationale and side effects were reviewed with patient and he agrees.    Metastasis to brain [facility]) He has finished tapering course of Dexamethasone    S/p Brain Radiation.  Refer to neurology  Weight loss Follow up with  nutritionist   Elevated alkaline phosphatase level Likely due to liver metastasis.    Bilateral lower extremity edema Obtain bilateral LE US  to r/o DVT.  Check BNP Consider diuretics.    Orders Placed This Encounter  Procedures   US  Venous Img Lower Bilateral    Standing Status:   Future    Expected Date:   01/21/2024    Expiration Date:   01/13/2025    Reason for Exam (SYMPTOM  OR DIAGNOSIS REQUIRED):   bilateral lower extremity swelling    Preferred imaging location?:   [facility]   Brain natriuretic peptide    Standing Status:   Future    Number of Occurrences:   1    Expected Date:   01/14/2024    Expiration Date:   01/13/2025   Follow up  3 weeks  All questions were answered. The patient knows to call the clinic with any problems, questions or concerns.  Tony Forbes, MD, PhD [facility] Hematology Oncology 01/14/2024    HISTORY OF PRESENTING ILLNESS:  Tony Paddock  Mitchell 83 y.o. male presents for follow up of stage IV lung SCC  Oncology History  Squamous cell carcinoma lung, left (HCC)  11/22/2023 Imaging   CT abdomen pelvis w contrast  1. Multiple hepatic metastatic lesions. Correlation with history of known malignancy recommended. 2. Serosal implant along the distal stomach versus gastrohepatic adenopathy. 3. Thickened appearance of the gastric antrum and pylorus likely related to underdistention. An infiltrative mass is less likely but not excluded. Endoscopy may provide better evaluation. 4. Herniation of a segment of the sigmoid colon into the left inguinal canal. No bowel obstruction.     12/04/2023 Imaging   PET scan showed  Extensive hypermetabolic neoplastic disease including left lower lobe infiltrative mass extending from the pleura to the hilum with numerous abnormal left-sided hilar and mediastinal nodes.   Additional upper retroperitoneal nodes as well as extensive liver metastases.   Multifocal osseous metastatic disease including the thoracic and lumbar spine, pelvis, right humeral head and left scapula.   Based on the overall appearance and with the patient's laboratory a abnormality this very well could be a primary left lower lobe lung lesion such as small cell versus a neuroendocrine tumor with diffuse metastatic disease.   12/10/2023 Imaging   MRI brain w wo contrast  1. 10 x 9 x 10 mm rounded lesion in the posterior left frontal lobe with surrounding vasogenic edema is consistent with metastatic disease. 2. No other pathologic enhancement is present. 3. Periventricular  and scattered subcortical T2 hyperintensities bilaterally are mildly advanced for age. The finding is nonspecific but can be seen in the setting of chronic microvascular ischemia, a demyelinating process such as multiple sclerosis, vasculitis, complicated migraine headaches, or as the sequelae of a prior infectious or inflammatory process.    12/17/2023 Initial Diagnosis   Squamous cell carcinoma lung, left (HCC)  He began experiencing abdominal pain for a week.  Initially presenting as increased gas and stomach growling despite having eaten. The pain was localized between the rib cage and the abdomen and described as intermittent.  Later the pain had resolved, but he continues to feel bloated. He has CT abdomen pelvis done which showed liver masses.   Liver biopsy showed  1. Liver, biopsy, right hepatic lesion :      - METASTATIC POORLY DIFFERENTIATED SQUAMOUS CELL CARCINOMA WITH NECROSIS.  Diagnosis Note : Per [facility] a recent PET scan revealed a hypermetabolic left lower lobe lung mass with extensive metastatic lesions. The carcinoma is positive for cytokeratin 7, p40 (greater than 50%), and CD56 (subset). Cytokeratin 20, TTF-1,  chromogranin, synaptophysin, and CDX-2 are negative. In the absence of  chromogranin / synaptophysin staining the presence of patchy CD56 staining is interpreted as non-specific. The pattern of immunoreactivity is consistent with metastatic poorly differentiated squamous cell carcinoma.     12/17/2023 Cancer Staging   Staging form: Lung, AJCC V9 - Clinical stage from 12/17/2023: Tony Mitchell, pM1c - Signed by Tony Forbes, MD on 12/17/2023 Histopathologic type: Squamous cell carcinoma, NOS Stage prefix: Initial diagnosis Laterality: Left Sites of metastasis: Bone, Liver, Brain   12/23/2023 - 12/25/2023 Chemotherapy   Patient is on Treatment Plan : LUNG Carboplatin  + Paclitaxel  q21d     01/14/2024 -  Chemotherapy   Patient is on Treatment Plan : LUNG NSCLC Carboplatin  (6) + Paclitaxel  (200) + Pembrolizumab (200) D1 q21d x 4 cycles / Pembrolizumab (200) Maintenance D1 q21d       Patient denies changes in bowel habits.  Denies unintentional weight loss night sweats or fever. No history of blood transfusions or hepatitis. He is not on any blood thinners but takes aspirin  for preventive purposes.  He has a history of  stage 3 renal disease diagnosed three years ago after a blood test revealed elevated potassium levels. Since then, he has made significant lifestyle changes, including stopping alcohol consumption and losing weight. He now maintains a weight between 145 and 151 pounds and walks approximately 5.6 miles daily.  He has a history of skin cancer, treated with resection, and is scheduled for further treatment. His father had prostate cancer.  Patient had a normal PSA in January 2025.   Patient lives at home with wife who has multiple medical problems.  He is the caregiver for her. His daughter passed away last year.  He has granddaughter who lives in [place] .  He denies SOB, chest pain, hemoptysis.  Former smoker.  Abdominal pain has improved and he did not need to take any pain meds.   INTERVAL HISTORY Tony Mitchell is a 83 y.o. male who has above history reviewed by me today presents for follow up visit for treatment of metastatic lung cancer.  He reports feeling ok today.  S/p brain radiation he finished tapering course of steroid.  + new onset of b/l lower extremities swelling. No tenderness    MEDICAL HISTORY:  Past Medical History:  Diagnosis Date   Acute appendicitis 03/03/2018   Arthritis    Cancer (HCC)  skin cancer   Chronic kidney disease, stage 3b (HCC)    Collapsed lung 2019   x2   COPD (chronic obstructive pulmonary disease) (HCC)    Coronary artery disease    Diabetes mellitus without complication (HCC)    diet controlled-lost 60 lbs and is not on any current meds   GERD (gastroesophageal reflux disease)    Gout    Hyperlipidemia    Hypertension    Sleep apnea    uses cpap   Thoracic aortic aneurysm (HCC)    Vitamin B 12 deficiency     SURGICAL HISTORY: Past Surgical History:  Procedure Laterality Date   CARDIAC CATHETERIZATION     CATARACT EXTRACTION  2012   CHEST TUBE INSERTION     x2   COLONOSCOPY WITH PROPOFOL  N/A 04/14/2021   Procedure: COLONOSCOPY  WITH PROPOFOL ;  Surgeon: Shane Darling, MD;  Location: [facility] ENDOSCOPY;  Service: Endoscopy;  Laterality: N/A;   ESOPHAGOGASTRODUODENOSCOPY (EGD) WITH PROPOFOL  N/A 04/14/2021   Procedure: ESOPHAGOGASTRODUODENOSCOPY (EGD) WITH PROPOFOL ;  Surgeon: Shane Darling, MD;  Location: [facility] ENDOSCOPY;  Service: Endoscopy;  Laterality: N/A;   INSERTION OF MESH  09/22/2021   Procedure: INSERTION OF MESH;  Surgeon: Conrado Delay, DO;  Location: [facility] ORS;  Service: General;;   JOINT REPLACEMENT     KNEE SURGERY Left 1965   LAPAROSCOPIC APPENDECTOMY N/A 03/03/2018   Procedure: APPENDECTOMY LAPAROSCOPIC;  Surgeon: Franki Isles, MD;  Location: [facility] ORS;  Service: General;  Laterality: N/A;   TOTAL KNEE ARTHROPLASTY Left 09/16/2018   Procedure: TOTAL KNEE ARTHROPLASTY;  Surgeon: Elner Hahn, MD;  Location: [facility] ORS;  Service: Orthopedics;  Laterality: Left;    SOCIAL HISTORY: Social History   Socioeconomic History   Marital status: Married    Spouse name: Not on file   Number of children: Not on file   Years of education: Not on file   Highest education level: Not on file  Occupational History   Not on file  Tobacco Use   Smoking status: Former    Current packs/day: 0.00    Average packs/day: 1 pack/day for 40.0 years (40.0 ttl pk-yrs)    Types: Cigarettes    Start date: 06/15/1956    Quit date: 06/15/1996    Years since quitting: 27.6    Passive exposure: Past   Smokeless tobacco: Never  Vaping Use   Vaping status: Never Used  Substance and Sexual Activity   Alcohol use: Not Currently    Alcohol/week: 21.0 standard drinks of alcohol    Types: 21 Shots of liquor per week    Comment: reports last drink 11/03/20   Drug use: No   Sexual activity: Yes  Other Topics Concern   Not on file  Social History Narrative   Not on file   Social Drivers of Health   Financial Resource Strain: Low Risk  (10/30/2023)   Received from [facility]   Overall Financial  Resource Strain (CARDIA)    Difficulty of Paying Living Expenses: Not hard at all  Food Insecurity: No Food Insecurity (10/30/2023)   Received from [facility]   Hunger Vital Sign    Worried About Running Out of Food in the Last Year: Never true    Ran Out of Food in the Last Year: Never true  Transportation Needs: No Transportation Needs (10/30/2023)   Received from [facility] - Transportation    In the past 12 months, has lack of  transportation kept you from medical appointments or from getting medications?: No    Lack of Transportation (Non-Medical): No  Physical Activity: Sufficiently Active (07/10/2017)   Received from [facility], [facility]   Exercise Vital Sign    Days of Exercise per Week: 5 days    Minutes of Exercise per Session: 60 min  Stress: Stress Concern Present (07/10/2017)   Received from [facility], [facility]   [employer] of Occupational Health - Occupational Stress Questionnaire    Feeling of Stress : Rather much  Social Connections: Unknown (07/10/2017)   Received from [facility], [facility]   Social Connection and Isolation Panel [NHANES]    Frequency of Communication with Friends and Family: Patient declined    Frequency of Social Gatherings with Friends and Family: Patient declined    Attends Religious Services: Patient declined    Active Member of Clubs or Organizations: Patient declined    Attends Engineer, structural: Patient declined    Marital Status: Patient declined  Catering manager Violence: Not on file    FAMILY HISTORY: Family History  Problem Relation Age of Onset   Cancer Father        Unknown   Prostate cancer Father    Lung cancer Neg Hx     ALLERGIES:  has no known allergies.  MEDICATIONS:  Current Outpatient Medications  Medication Sig Dispense Refill    allopurinol  (ZYLOPRIM ) 100 MG tablet Take 100 mg by mouth in the morning and at bedtime.     ALPRAZolam  (XANAX ) 0.5 MG tablet Take 0.5 mg by mouth at bedtime.     amLODipine  (NORVASC ) 2.5 MG tablet Take 2.5 mg by mouth in the morning.     fenofibrate  (TRICOR ) 145 MG tablet Take 145 mg by mouth 2 (two) times a week. On Wednesday and Sunday     finasteride  (PROSCAR ) 5 MG tablet Take 1 tablet (5 mg total) by mouth daily. 90 tablet 3   gabapentin  (NEURONTIN ) 100 MG capsule Take 100 mg by mouth.     hydrALAZINE  (APRESOLINE ) 25 MG tablet Take 25 mg by mouth 2 (two) times daily.     lansoprazole (PREVACID) 15 MG capsule Take 15 mg by mouth every evening.     oxyCODONE  (OXY IR/ROXICODONE ) 5 MG immediate release tablet Take 1 tablet (5 mg total) by mouth every 6 (six) hours as needed for severe pain (pain score 7-10) or breakthrough pain. 10 tablet 0   prochlorperazine  (COMPAZINE ) 10 MG tablet Take 1 tablet (10 mg total) by mouth every 6 (six) hours as needed for nausea or vomiting. 30 tablet 1   simvastatin  (ZOCOR ) 20 MG tablet Take 1 tablet by mouth 2 (two) times a week. On  Wednesday and Sunday     tamsulosin  (FLOMAX ) 0.4 MG CAPS capsule Take 1 capsule (0.4 mg total) by mouth daily. 30 capsule 11   vitamin B-12 (CYANOCOBALAMIN ) 1000 MCG tablet Take 1,000 mcg by mouth every morning.     Vitamin D, Ergocalciferol, (DRISDOL) 1.25 MG (50000 UNIT) CAPS capsule Take 50,000 Units by mouth every 7 (seven) days.     colchicine 0.6 MG tablet Take 2 tablets (1.2mg ) by mouth at first sign of gout flare followed by 1 tablet (0.6mg ) after 1 hour. (Max 1.8mg  within 1 hour) (Patient not taking: Reported on 12/12/2023)     triamcinolone  ointment (KENALOG ) 0.5 % Apply 1 Application topically 2 (two) times daily. (Patient not taking:  Reported on 12/12/2023) 30 g 0   No current facility-administered medications for this visit.   Facility-Administered Medications Ordered in Other Visits  Medication Dose Route Frequency  Provider Last Rate Last Admin   0.9 %  sodium chloride  infusion   Intravenous Continuous Tony Forbes, MD 10 mL/hr at 01/14/24 0953 New Bag at 01/14/24 0953   CARBOplatin  (PARAPLATIN ) 370 mg in sodium chloride  0.9 % 100 mL chemo infusion  370 mg Intravenous Once Kaston Faughn, MD       PACLitaxel  (TAXOL ) 264 mg in sodium chloride  0.9 % 250 mL chemo infusion (> 80mg /m2)  150 mg/m2 (Treatment Plan Recorded) Intravenous Once Tony Forbes, MD 98 mL/hr at 01/14/24 1147 264 mg at 01/14/24 1147    Review of Systems  Constitutional:  Negative for appetite change, chills, fatigue, fever and unexpected weight change.  HENT:   Negative for hearing loss and voice change.   Eyes:  Negative for eye problems and icterus.  Respiratory:  Negative for chest tightness, cough and shortness of breath.   Cardiovascular:  Negative for chest pain and leg swelling.  Gastrointestinal:  Negative for abdominal distention and abdominal pain.       Bloating  Endocrine: Negative for hot flashes.  Genitourinary:  Negative for difficulty urinating, dysuria and frequency.   Musculoskeletal:  Negative for arthralgias.  Skin:  Negative for itching and rash.  Neurological:  Negative for light-headedness and numbness.  Hematological:  Negative for adenopathy. Does not bruise/bleed easily.  Psychiatric/Behavioral:  Negative for confusion.      PHYSICAL EXAMINATION: ECOG PERFORMANCE STATUS: 1 - Symptomatic but completely ambulatory  Vitals:   01/14/24 0826  BP: (!) 152/67  Pulse: 86  Resp: 16  Temp: 98 F (36.7 C)  SpO2: 99%   Filed Weights   01/14/24 0826  Weight: 151 lb 8 oz (68.7 kg)    Physical Exam Constitutional:      General: He is not in acute distress.    Appearance: He is not diaphoretic.  HENT:     Head: Normocephalic and atraumatic.     Nose: Nose normal.  Eyes:     General: No scleral icterus. Cardiovascular:     Rate and Rhythm: Normal rate and regular rhythm.     Heart sounds: No murmur  heard. Pulmonary:     Effort: Pulmonary effort is normal. No respiratory distress.  Abdominal:     General: There is no distension.     Palpations: Abdomen is soft. There is mass.     Tenderness: There is no abdominal tenderness.  Musculoskeletal:        General: Normal range of motion.     Cervical back: Normal range of motion and neck supple.  Skin:    General: Skin is warm and dry.     Findings: No erythema.  Neurological:     Mental Status: He is alert and oriented to person, place, and time. Mental status is at baseline.     Cranial Nerves: No cranial nerve deficit.     Motor: No abnormal muscle tone.     Coordination: Coordination normal.  Psychiatric:        Mood and Affect: Mood and affect normal.      LABORATORY DATA:  I have reviewed the data as listed    Latest Ref Rng & Units 01/14/2024    8:11 AM 12/30/2023    7:52 AM 12/23/2023    8:09 AM  CBC  WBC 4.0 - 10.5 K/uL 11.1  18.9  10.4   Hemoglobin 13.0 - 17.0 g/dL 81.1  91.4  78.2   Hematocrit 39.0 - 52.0 % 36.2  40.4  42.3   Platelets 150 - 400 K/uL 274  110  278       Latest Ref Rng & Units 01/14/2024    8:11 AM 12/30/2023    7:51 AM 12/23/2023    8:09 AM  CMP  Glucose 70 - 99 mg/dL 956  97  213   BUN 8 - 23 mg/dL 24  35  40   Creatinine 0.61 - 1.24 mg/dL 0.86  5.78  4.69   Sodium 135 - 145 mmol/L 132  131  132   Potassium 3.5 - 5.1 mmol/L 4.2  5.0  4.4   Chloride 98 - 111 mmol/L 99  98  97   CO2 22 - 32 mmol/L 26  25  24    Calcium 8.9 - 10.3 mg/dL 8.6  8.7  8.6   Total Protein 6.5 - 8.1 g/dL 5.9  5.6  6.2   Total Bilirubin 0.0 - 1.2 mg/dL 0.7  1.1  1.0   Alkaline Phos 38 - 126 U/L 231  407  353   AST 15 - 41 U/L 60  133  119   ALT 0 - 44 U/L 34  57  54      RADIOGRAPHIC STUDIES: I have personally reviewed the radiological images as listed and agreed with the findings in the report. MR Brain W Wo Contrast Result Date: 12/18/2023 CLINICAL DATA:  Metastatic disease evaluation.  SRS planning. EXAM: MRI  HEAD WITHOUT AND WITH CONTRAST TECHNIQUE: Multiplanar, multiecho pulse sequences of the brain and surrounding structures were obtained without and with intravenous contrast. CONTRAST:  6mL GADAVIST  GADOBUTROL  1 MMOL/ML IV SOLN COMPARISON:  12/10/2023 FINDINGS: Brain: Redemonstration of a 10 mm centrally necrotic metastasis at the left frontoparietal vertex with mild surrounding edema. No second lesion identified. No abnormality of the brainstem or cerebellum. Cerebral hemispheres otherwise show mild chronic small-vessel ischemic change of the white matter, less than often seen at this age. No hydrocephalus or extra-axial collection. Vascular: Major vessels at the base of the brain show flow. Skull and upper cervical spine: Negative Sinuses/Orbits: Clear/normal Other: None IMPRESSION: Redemonstration of a 10 mm centrally necrotic metastasis at the left frontoparietal vertex with mild surrounding edema. No second lesion. Electronically Signed   By: Bettylou Brunner M.D.   On: 12/18/2023 13:45

## 2024-01-14 NOTE — Assessment & Plan Note (Signed)
 Likely due to liver metastasis.

## 2024-01-14 NOTE — Progress Notes (Signed)
 Nutrition Assessment   Reason for Assessment:  New chemo start, weight loss   ASSESSMENT:   83 year old male with stage IV SCC of lung with metastases to bone, liver, brain.  Past medical history of CKD stage 3, DM, CAD, COPD, GERD, HLD, HTN, Vit b 12.  Patient receiving carboplatin , paclitaxel , keytruda  Met with patient during infusion.  Reports that his appetite is good.  "I eat all the time."  Reports that he usually eats cereal and fruit for breakfast with 2 cups of coffee.  During the day snacks on fruits, vegetables, nuts, pickled herring.  Reports that he changed his diet in March of 2022 after kidney function was elevated.  Drinks water  mainly and small glass of wine at night.  Was walking 5 miles a day but this has been decreased to about 2 miles recently.       Medications: compazine , vit b 12, vit d   Labs: Na 132, glucose 135   Anthropometrics:   Height: 69 inches Weight: 151 lb 8 oz today UBW: 145-151 lb (intentional lost weight with change in diet in March 2022 BMI: 22  139 lb on 5/12  Weight increased back to normal range  Estimated Energy Needs  Kcals: 1700-2000 Protein: 85-100 g Fluid: 1700-2000 ml   NUTRITION DIAGNOSIS: none at this time   MALNUTRITION DIAGNOSIS: none at this time   INTERVENTION:  Encouraged patient to continue well balanced diet of plant foods and lean animal proteins.   Discussed nutrition during cancer treatment.  Handout provided Contact information provided   MONITORING, EVALUATION, GOAL: weight trends, intake   Next Visit: Tuesday, June 17 during infusion  Zenith Kercheval B. Zollie Hipp, CSO, LDN Registered Dietitian [phone]

## 2024-01-14 NOTE — Assessment & Plan Note (Signed)
 Obtain bilateral LE US  to r/o DVT.  Check BNP Consider diuretics.

## 2024-01-15 ENCOUNTER — Ambulatory Visit
Admission: RE | Admit: 2024-01-15 | Discharge: 2024-01-15 | Disposition: A | Discharge status: Home / Self Care | Source: Ambulatory Visit | Attending: Oncology | Admitting: Oncology

## 2024-01-15 ENCOUNTER — Telehealth: Payer: Self-pay

## 2024-01-15 ENCOUNTER — Inpatient Hospital Stay

## 2024-01-15 ENCOUNTER — Other Ambulatory Visit: Payer: Self-pay

## 2024-01-15 ENCOUNTER — Inpatient Hospital Stay: Admitting: Occupational Therapy

## 2024-01-15 DIAGNOSIS — C3492 Malignant neoplasm of unspecified part of left bronchus or lung: Secondary | ICD-10-CM

## 2024-01-15 DIAGNOSIS — R600 Localized edema: Secondary | ICD-10-CM | POA: Insufficient documentation

## 2024-01-15 DIAGNOSIS — M6281 Muscle weakness (generalized): Secondary | ICD-10-CM

## 2024-01-15 MED ORDER — FUROSEMIDE 20 MG PO TABS: 20.0000 mg | ORAL_TABLET | Freq: Every day | ORAL | 0 refills | Status: AC | PRN

## 2024-01-15 NOTE — Telephone Encounter (Signed)
 Report received from Rad tech with US  department. Per Dr. Wilhelmenia Harada "please let pt know that no DVT in legs. I recommend him to try lasix 20mg  daily as needed for leg swelling. Rx sent"   Called and informed pt of this. Pt verbalized understanding and will pick up medication.

## 2024-01-15 NOTE — Therapy (Signed)
 [facility] - A Dept Of [place]. [facility] [address] [place], [place], [address] Phone: [phone]   Fax:  [phone]  Occupational Therapy Screen  Patient Details  Name: Tony Mitchell MRN: [id number] Date of Birth: [birth year] No data recorded  Encounter Date: 01/15/2024   OT End of Session - 01/15/24 1159     Visit Number 0             Past Medical History:  Diagnosis Date   Acute appendicitis 03/03/2018   Arthritis    Cancer (HCC)    skin cancer   Chronic kidney disease, stage 3b (HCC)    Collapsed lung 2019   x2   COPD (chronic obstructive pulmonary disease) (HCC)    Coronary artery disease    Diabetes mellitus without complication (HCC)    diet controlled-lost 60 lbs and is not on any current meds   GERD (gastroesophageal reflux disease)    Gout    Hyperlipidemia    Hypertension    Sleep apnea    uses cpap   Thoracic aortic aneurysm (HCC)    Vitamin B 12 deficiency     Past Surgical History:  Procedure Laterality Date   CARDIAC CATHETERIZATION     CATARACT EXTRACTION  2012   CHEST TUBE INSERTION     x2   COLONOSCOPY WITH PROPOFOL  N/A 04/14/2021   Procedure: COLONOSCOPY WITH PROPOFOL ;  Surgeon: Shane Darling, MD;  Location: [facility] ENDOSCOPY;  Service: Endoscopy;  Laterality: N/A;   ESOPHAGOGASTRODUODENOSCOPY (EGD) WITH PROPOFOL  N/A 04/14/2021   Procedure: ESOPHAGOGASTRODUODENOSCOPY (EGD) WITH PROPOFOL ;  Surgeon: Shane Darling, MD;  Location: [facility] ENDOSCOPY;  Service: Endoscopy;  Laterality: N/A;   INSERTION OF MESH  09/22/2021   Procedure: INSERTION OF MESH;  Surgeon: Conrado Delay, DO;  Location: [facility] ORS;  Service: General;;   JOINT REPLACEMENT     KNEE SURGERY Left 1965   LAPAROSCOPIC APPENDECTOMY N/A 03/03/2018   Procedure: APPENDECTOMY LAPAROSCOPIC;  Surgeon: Franki Isles, MD;  Location: [facility] ORS;  Service: General;  Laterality: N/A;   TOTAL KNEE ARTHROPLASTY Left  09/16/2018   Procedure: TOTAL KNEE ARTHROPLASTY;  Surgeon: Elner Hahn, MD;  Location: [facility] ORS;  Service: Orthopedics;  Laterality: Left;    There were no vitals filed for this visit.      Dr Wilhelmenia Harada 01/14/24: ASSESSMENT & PLAN:   Cancer Staging  Squamous cell carcinoma lung, left (HCC) Staging form: Lung, AJCC V9 - Clinical stage from 12/17/2023: [name], pM1c - Signed by [name], MD on 12/17/2023   Squamous cell carcinoma lung, left [facility]) Imaging results and pathology results were reviewed and discussed with patient. 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 NGS showed TMB 53.2 high, PD-L1 <1%, SMARCB1- high TMB, will add Keytruda Labs are reviewed and discussed with patient.' Proceed with cycle 2 Carboplatin  + Taxol  with D3 GCSF + add Keytruda Rationale and side effects were reviewed with patient and he agrees.      Metastasis to brain [facility]) He has finished tapering course of Dexamethasone    S/p Brain Radiation.  Refer to neurology   Weight loss Follow up with  nutritionist    Elevated alkaline phosphatase level Likely due to liver metastasis.     Bilateral lower extremity edema Obtain bilateral LE US  to r/o DVT.  Check BNP Consider diuretics.     OT SCREEN 01/15/24:  Patient referred for decreasing fall risk.  Patient denies any falls in the last 6 months.  Reports since diagnosis lost weight purposefully.  Changing his diet as well as walking 5 miles a day.  Does have some right knee pain.  Cannot have a shot at the moment.  But does wear compression sleeve. Wife is on oxygen at home.  Is ambulatory.  But limited by back pain.  She is independent in ADLs Patient does grocery shopping as well as cooking and laundry. Has somebody that cleans the house. Daughter in [place]  passed away last year.  Has a 16 year old granddaughter in [place] . Patient balance test score 48/56 low risk for  falling. But patient difficulty with sit and stand as well as tandem standing, and 1 leg Patient does great with walking but weakness in proximal larger muscles and core. Patient's ultrasound was negative for DVT.  Dr. Wilhelmenia Harada put him on Lasix Recommend for patient knee-high compression to wear when up and about.  Patient interested in CARE program -if continues to be limited by knee pain can possibly contact Dr. Daun Epstein about referral for physical therapy.                                        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    Type 2 diabetes mellitus with hyperlipidemia (HCC)    Hyperkalemia    Diarrhea    Anemia of chronic disease    AKI (acute kidney injury) (HCC) 11/03/2020   Status post total knee replacement using cement, left 09/16/2018   History of pneumothorax 11/07/2017   Pneumothorax 09/18/2017   Thoracic aortic aneurysm without rupture (HCC) 08/16/2017   Primary osteoarthritis of right knee 07/10/2017   Spinal stenosis of lumbar region with radiculopathy 07/10/2017   GERD (gastroesophageal reflux disease) 06/13/2016   Gout 06/13/2016   Hypertriglyceridemia 06/13/2016   Essential hypertension 06/13/2016   Pneumothorax on right 06/03/2016   Elevated PSA 03/29/2016   Elevated blood sugar 09/29/2015   Rash 02/17/2015   OSA (obstructive sleep apnea) 07/08/2014   S/P cardiac catheterization 05/28/2014   SOB (shortness of breath) on exertion 05/28/2014    Heloise Lobo, OTR/L,CLT 01/15/2024, 11:59 AM  [place] [facility] - A Dept Of [place]. [facility] [address], [address] [place], [place], [address] Phone: [phone]   Fax:  [phone]  Name: Tony Mitchell MRN: [id number] Date of Birth: Oct 26, 1940

## 2024-01-15 NOTE — Addendum Note (Signed)
 Addended by: [name] on: 01/15/2024 09:32 AM   Modules accepted: Orders

## 2024-01-16 ENCOUNTER — Inpatient Hospital Stay

## 2024-01-16 ENCOUNTER — Encounter: Payer: Self-pay | Admitting: Oncology

## 2024-01-16 DIAGNOSIS — E119 Type 2 diabetes mellitus without complications: Secondary | ICD-10-CM | POA: Diagnosis not present

## 2024-01-16 DIAGNOSIS — E538 Deficiency of other specified B group vitamins: Secondary | ICD-10-CM | POA: Diagnosis not present

## 2024-01-16 DIAGNOSIS — Z5111 Encounter for antineoplastic chemotherapy: Secondary | ICD-10-CM | POA: Diagnosis not present

## 2024-01-16 DIAGNOSIS — E785 Hyperlipidemia, unspecified: Secondary | ICD-10-CM | POA: Diagnosis not present

## 2024-01-16 DIAGNOSIS — C3432 Malignant neoplasm of lower lobe, left bronchus or lung: Secondary | ICD-10-CM | POA: Diagnosis not present

## 2024-01-16 DIAGNOSIS — R634 Abnormal weight loss: Secondary | ICD-10-CM | POA: Diagnosis not present

## 2024-01-16 DIAGNOSIS — Z923 Personal history of irradiation: Secondary | ICD-10-CM | POA: Diagnosis not present

## 2024-01-16 DIAGNOSIS — N1832 Chronic kidney disease, stage 3b: Secondary | ICD-10-CM | POA: Diagnosis not present

## 2024-01-16 DIAGNOSIS — C3492 Malignant neoplasm of unspecified part of left bronchus or lung: Secondary | ICD-10-CM

## 2024-01-16 DIAGNOSIS — I712 Thoracic aortic aneurysm, without rupture, unspecified: Secondary | ICD-10-CM | POA: Diagnosis not present

## 2024-01-16 DIAGNOSIS — I2510 Atherosclerotic heart disease of native coronary artery without angina pectoris: Secondary | ICD-10-CM | POA: Diagnosis not present

## 2024-01-16 DIAGNOSIS — I129 Hypertensive chronic kidney disease with stage 1 through stage 4 chronic kidney disease, or unspecified chronic kidney disease: Secondary | ICD-10-CM | POA: Diagnosis not present

## 2024-01-16 DIAGNOSIS — Z510 Encounter for antineoplastic radiation therapy: Secondary | ICD-10-CM | POA: Diagnosis not present

## 2024-01-16 DIAGNOSIS — R748 Abnormal levels of other serum enzymes: Secondary | ICD-10-CM | POA: Diagnosis not present

## 2024-01-16 DIAGNOSIS — G4730 Sleep apnea, unspecified: Secondary | ICD-10-CM | POA: Diagnosis not present

## 2024-01-16 DIAGNOSIS — J449 Chronic obstructive pulmonary disease, unspecified: Secondary | ICD-10-CM | POA: Diagnosis not present

## 2024-01-16 DIAGNOSIS — C787 Secondary malignant neoplasm of liver and intrahepatic bile duct: Secondary | ICD-10-CM | POA: Diagnosis not present

## 2024-01-16 DIAGNOSIS — C7951 Secondary malignant neoplasm of bone: Secondary | ICD-10-CM | POA: Diagnosis not present

## 2024-01-16 DIAGNOSIS — C7931 Secondary malignant neoplasm of brain: Secondary | ICD-10-CM | POA: Diagnosis not present

## 2024-01-16 MED ORDER — PEGFILGRASTIM-FPGK 6 MG/0.6ML ~~LOC~~ SOSY
6.0000 mg | PREFILLED_SYRINGE | Freq: Once | SUBCUTANEOUS | Status: AC
  Administered 2024-01-16: 6 mg via SUBCUTANEOUS
  Filled 2024-01-16: qty 0.6

## 2024-01-17 ENCOUNTER — Telehealth: Payer: Self-pay | Admitting: Internal Medicine

## 2024-01-17 ENCOUNTER — Inpatient Hospital Stay (HOSPITAL_BASED_OUTPATIENT_CLINIC_OR_DEPARTMENT_OTHER): Admitting: Internal Medicine

## 2024-01-17 VITALS — BP 145/64 | HR 95 | Resp 18 | Ht 69.5 in | Wt 151.0 lb

## 2024-01-17 DIAGNOSIS — C7951 Secondary malignant neoplasm of bone: Secondary | ICD-10-CM | POA: Diagnosis not present

## 2024-01-17 DIAGNOSIS — E785 Hyperlipidemia, unspecified: Secondary | ICD-10-CM | POA: Diagnosis not present

## 2024-01-17 DIAGNOSIS — Z923 Personal history of irradiation: Secondary | ICD-10-CM | POA: Diagnosis not present

## 2024-01-17 DIAGNOSIS — C3432 Malignant neoplasm of lower lobe, left bronchus or lung: Secondary | ICD-10-CM | POA: Diagnosis not present

## 2024-01-17 DIAGNOSIS — R634 Abnormal weight loss: Secondary | ICD-10-CM | POA: Diagnosis not present

## 2024-01-17 DIAGNOSIS — Z87891 Personal history of nicotine dependence: Secondary | ICD-10-CM | POA: Diagnosis not present

## 2024-01-17 DIAGNOSIS — R600 Localized edema: Secondary | ICD-10-CM | POA: Diagnosis not present

## 2024-01-17 DIAGNOSIS — Z79899 Other long term (current) drug therapy: Secondary | ICD-10-CM | POA: Diagnosis not present

## 2024-01-17 DIAGNOSIS — C7931 Secondary malignant neoplasm of brain: Secondary | ICD-10-CM

## 2024-01-17 DIAGNOSIS — E119 Type 2 diabetes mellitus without complications: Secondary | ICD-10-CM | POA: Diagnosis not present

## 2024-01-17 DIAGNOSIS — I129 Hypertensive chronic kidney disease with stage 1 through stage 4 chronic kidney disease, or unspecified chronic kidney disease: Secondary | ICD-10-CM | POA: Diagnosis not present

## 2024-01-17 DIAGNOSIS — Z7952 Long term (current) use of systemic steroids: Secondary | ICD-10-CM | POA: Diagnosis not present

## 2024-01-17 DIAGNOSIS — J449 Chronic obstructive pulmonary disease, unspecified: Secondary | ICD-10-CM | POA: Diagnosis not present

## 2024-01-17 DIAGNOSIS — Z5111 Encounter for antineoplastic chemotherapy: Secondary | ICD-10-CM | POA: Diagnosis not present

## 2024-01-17 DIAGNOSIS — C787 Secondary malignant neoplasm of liver and intrahepatic bile duct: Secondary | ICD-10-CM | POA: Diagnosis not present

## 2024-01-17 DIAGNOSIS — I2510 Atherosclerotic heart disease of native coronary artery without angina pectoris: Secondary | ICD-10-CM | POA: Diagnosis not present

## 2024-01-17 DIAGNOSIS — Z510 Encounter for antineoplastic radiation therapy: Secondary | ICD-10-CM | POA: Diagnosis not present

## 2024-01-17 DIAGNOSIS — R748 Abnormal levels of other serum enzymes: Secondary | ICD-10-CM | POA: Diagnosis not present

## 2024-01-17 DIAGNOSIS — Z8042 Family history of malignant neoplasm of prostate: Secondary | ICD-10-CM | POA: Diagnosis not present

## 2024-01-17 DIAGNOSIS — G4730 Sleep apnea, unspecified: Secondary | ICD-10-CM | POA: Diagnosis not present

## 2024-01-17 DIAGNOSIS — N1832 Chronic kidney disease, stage 3b: Secondary | ICD-10-CM | POA: Diagnosis not present

## 2024-01-17 DIAGNOSIS — Z85828 Personal history of other malignant neoplasm of skin: Secondary | ICD-10-CM | POA: Diagnosis not present

## 2024-01-17 DIAGNOSIS — E538 Deficiency of other specified B group vitamins: Secondary | ICD-10-CM | POA: Diagnosis not present

## 2024-01-17 DIAGNOSIS — I712 Thoracic aortic aneurysm, without rupture, unspecified: Secondary | ICD-10-CM | POA: Diagnosis not present

## 2024-01-17 NOTE — Telephone Encounter (Signed)
 I saw the MRI brain order to be scheduled. I called the pt and let him know that order was in there and asked if it was okay to transfer the phone call to centralized scheduling to schedule that appt. Pt was agreeable and call was transferred. Pt is aware of next visit with Dr.Vaslow on 04/03/24

## 2024-01-17 NOTE — Progress Notes (Signed)
 [facility] [address]  [place], [place] [address] [phone]   New Patient Evaluation  Date of Service: 01/17/24 Patient Name: Tony Mitchell Patient MRN: [id number] Patient DOB: 1941-07-16 Provider: Mamie Searles, MD  Identifying Statement:  Tony Mitchell is a 83 y.o. male with Metastasis to brain [facility]) who presents for initial consultation and evaluation regarding cancer associated neurologic deficits.    Referring Provider: [name], MD [address] [place],  [place] [address]  Primary Cancer:  Oncologic History: Oncology History  Squamous cell carcinoma lung, left (HCC)  11/22/2023 Imaging   CT abdomen pelvis w contrast  1. Multiple hepatic metastatic lesions. Correlation with history of known malignancy recommended. 2. Serosal implant along the distal stomach versus gastrohepatic adenopathy. 3. Thickened appearance of the gastric antrum and pylorus likely related to underdistention. An infiltrative mass is less likely but not excluded. Endoscopy may provide better evaluation. 4. Herniation of a segment of the sigmoid colon into the left inguinal canal. No bowel obstruction.     12/04/2023 Imaging   PET scan showed  Extensive hypermetabolic neoplastic disease including left lower lobe infiltrative mass extending from the pleura to the hilum with numerous abnormal left-sided hilar and mediastinal nodes.   Additional upper retroperitoneal nodes as well as extensive liver metastases.   Multifocal osseous metastatic disease including the thoracic and lumbar spine, pelvis, right humeral head and left scapula.   Based on the overall appearance and with the patient's laboratory a abnormality this very well could be a primary left lower lobe lung lesion such as small cell versus a neuroendocrine tumor with diffuse metastatic disease.   12/10/2023 Imaging   MRI brain w wo contrast  1. 10 x 9 x 10 mm rounded  lesion in the posterior left frontal lobe with surrounding vasogenic edema is consistent with metastatic disease. 2. No other pathologic enhancement is present. 3. Periventricular and scattered subcortical T2 hyperintensities bilaterally are mildly advanced for age. The finding is nonspecific but can be seen in the setting of chronic microvascular ischemia, a demyelinating process such as multiple sclerosis, vasculitis, complicated migraine headaches, or as the sequelae of a prior infectious or inflammatory process.   12/17/2023 Initial Diagnosis   Squamous cell carcinoma lung, left (HCC)  He began experiencing abdominal pain for a week.  Initially presenting as increased gas and stomach growling despite having eaten. The pain was localized between the rib cage and the abdomen and described as intermittent.  Later the pain had resolved, but he continues to feel bloated. He has CT abdomen pelvis done which showed liver masses.   Liver biopsy showed  1. Liver, biopsy, right hepatic lesion :      - METASTATIC POORLY DIFFERENTIATED SQUAMOUS CELL CARCINOMA WITH NECROSIS.  Diagnosis Note : Per [facility] a recent PET scan revealed a hypermetabolic left lower lobe lung mass with extensive metastatic lesions. The carcinoma is positive for cytokeratin 7, p40 (greater than 50%), and CD56 (subset). Cytokeratin 20, TTF-1,  chromogranin, synaptophysin, and CDX-2 are negative. In the absence of  chromogranin / synaptophysin staining the presence of patchy CD56 staining is interpreted as non-specific. The pattern of immunoreactivity is consistent with metastatic poorly differentiated squamous cell carcinoma.     12/17/2023 Cancer Staging   Staging form: Lung, AJCC V9 - Clinical stage from 12/17/2023: Tony Mitchell, pM1c - Signed by [name], MD on 12/17/2023 Histopathologic type: Squamous cell carcinoma, NOS Stage prefix: Initial diagnosis Laterality: Left Sites  of metastasis: Bone, Liver, Brain   12/23/2023 -  12/25/2023 Chemotherapy   Patient is on Treatment Plan : LUNG Carboplatin  + Paclitaxel  q21d     01/14/2024 -  Chemotherapy   Patient is on Treatment Plan : LUNG NSCLC Carboplatin  (6) + Paclitaxel  (200) + Pembrolizumab (200) D1 q21d x 4 cycles / Pembrolizumab (200) Maintenance D1 q21d      CNS Oncologic History 12/30/23: SRS to R frontal oligometastasis  History of Present Illness: The patient's records from the referring physician were obtained and reviewed and the patient interviewed to confirm this HPI.  Tony Mitchell presents today for evaluation for recent brain metastasis.  Single tumor was initially uncovered during routine staging, he denies any right sided weakness or other neurologic symptoms, aside from fatigue and some short term memory loss.  He ambulates on his own, though limited by right knee pain.  He is otherwise doing well with chemotherapy with Dr. Wilhelmenia Harada.    Medications: Current Outpatient Medications on File Prior to Visit  Medication Sig Dispense Refill   allopurinol  (ZYLOPRIM ) 100 MG tablet Take 100 mg by mouth in the morning and at bedtime.     ALPRAZolam  (XANAX ) 0.5 MG tablet Take 0.5 mg by mouth at bedtime.     amLODipine  (NORVASC ) 2.5 MG tablet Take 2.5 mg by mouth in the morning.     colchicine 0.6 MG tablet Take 2 tablets (1.2mg ) by mouth at first sign of gout flare followed by 1 tablet (0.6mg ) after 1 hour. (Max 1.8mg  within 1 hour) (Patient not taking: Reported on 12/12/2023)     fenofibrate  (TRICOR ) 145 MG tablet Take 145 mg by mouth 2 (two) times a week. On Wednesday and Sunday     finasteride  (PROSCAR ) 5 MG tablet Take 1 tablet (5 mg total) by mouth daily. 90 tablet 3   furosemide (LASIX) 20 MG tablet Take 1 tablet (20 mg total) by mouth daily as needed. 30 tablet 0   gabapentin  (NEURONTIN ) 100 MG capsule Take 100 mg by mouth.     hydrALAZINE  (APRESOLINE ) 25 MG tablet Take 25 mg by mouth 2 (two) times daily.     lansoprazole (PREVACID) 15 MG capsule Take 15 mg  by mouth every evening.     oxyCODONE  (OXY IR/ROXICODONE ) 5 MG immediate release tablet Take 1 tablet (5 mg total) by mouth every 6 (six) hours as needed for severe pain (pain score 7-10) or breakthrough pain. 10 tablet 0   prochlorperazine  (COMPAZINE ) 10 MG tablet Take 1 tablet (10 mg total) by mouth every 6 (six) hours as needed for nausea or vomiting. 30 tablet 1   simvastatin  (ZOCOR ) 20 MG tablet Take 1 tablet by mouth 2 (two) times a week. On  Wednesday and Sunday     tamsulosin  (FLOMAX ) 0.4 MG CAPS capsule Take 1 capsule (0.4 mg total) by mouth daily. 30 capsule 11   triamcinolone  ointment (KENALOG ) 0.5 % Apply 1 Application topically 2 (two) times daily. (Patient not taking: Reported on 12/12/2023) 30 g 0   vitamin B-12 (CYANOCOBALAMIN ) 1000 MCG tablet Take 1,000 mcg by mouth every morning.     Vitamin D, Ergocalciferol, (DRISDOL) 1.25 MG (50000 UNIT) CAPS capsule Take 50,000 Units by mouth every 7 (seven) days.     No current facility-administered medications on file prior to visit.    Allergies: No Known Allergies Past Medical History:  Past Medical History:  Diagnosis Date   Acute appendicitis 03/03/2018   Arthritis    Cancer (HCC)  skin cancer   Chronic kidney disease, stage 3b (HCC)    Collapsed lung 2019   x2   COPD (chronic obstructive pulmonary disease) (HCC)    Coronary artery disease    Diabetes mellitus without complication (HCC)    diet controlled-lost 60 lbs and is not on any current meds   GERD (gastroesophageal reflux disease)    Gout    Hyperlipidemia    Hypertension    Sleep apnea    uses cpap   Thoracic aortic aneurysm (HCC)    Vitamin B 12 deficiency    Past Surgical History:  Past Surgical History:  Procedure Laterality Date   CARDIAC CATHETERIZATION     CATARACT EXTRACTION  2012   CHEST TUBE INSERTION     x2   COLONOSCOPY WITH PROPOFOL  N/A 04/14/2021   Procedure: COLONOSCOPY WITH PROPOFOL ;  Surgeon: Shane Darling, MD;  Location: [facility]  ENDOSCOPY;  Service: Endoscopy;  Laterality: N/A;   ESOPHAGOGASTRODUODENOSCOPY (EGD) WITH PROPOFOL  N/A 04/14/2021   Procedure: ESOPHAGOGASTRODUODENOSCOPY (EGD) WITH PROPOFOL ;  Surgeon: Shane Darling, MD;  Location: [facility] ENDOSCOPY;  Service: Endoscopy;  Laterality: N/A;   INSERTION OF MESH  09/22/2021   Procedure: INSERTION OF MESH;  Surgeon: Conrado Delay, DO;  Location: [facility] ORS;  Service: General;;   JOINT REPLACEMENT     KNEE SURGERY Left 1965   LAPAROSCOPIC APPENDECTOMY N/A 03/03/2018   Procedure: APPENDECTOMY LAPAROSCOPIC;  Surgeon: Franki Isles, MD;  Location: [facility] ORS;  Service: General;  Laterality: N/A;   TOTAL KNEE ARTHROPLASTY Left 09/16/2018   Procedure: TOTAL KNEE ARTHROPLASTY;  Surgeon: Elner Hahn, MD;  Location: [facility] ORS;  Service: Orthopedics;  Laterality: Left;   Social History:  Social History   Socioeconomic History   Marital status: Married    Spouse name: Not on file   Number of children: Not on file   Years of education: Not on file   Highest education level: Not on file  Occupational History   Not on file  Tobacco Use   Smoking status: Former    Current packs/day: 0.00    Average packs/day: 1 pack/day for 40.0 years (40.0 ttl pk-yrs)    Types: Cigarettes    Start date: 06/15/1956    Quit date: 06/15/1996    Years since quitting: 27.6    Passive exposure: Past   Smokeless tobacco: Never  Vaping Use   Vaping status: Never Used  Substance and Sexual Activity   Alcohol use: Not Currently    Alcohol/week: 21.0 standard drinks of alcohol    Types: 21 Shots of liquor per week    Comment: reports last drink 11/03/20   Drug use: No   Sexual activity: Yes  Other Topics Concern   Not on file  Social History Narrative   Not on file   Social Drivers of Health   Financial Resource Strain: Low Risk  (10/30/2023)   Received from [facility]   Overall Financial Resource Strain (CARDIA)    Difficulty of Paying Living Expenses: Not  hard at all  Food Insecurity: No Food Insecurity (10/30/2023)   Received from [facility]   Hunger Vital Sign    Worried About Running Out of Food in the Last Year: Never true    Ran Out of Food in the Last Year: Never true  Transportation Needs: No Transportation Needs (10/30/2023)   Received from [facility] - Transportation    In the past 12 months, has lack  of transportation kept you from medical appointments or from getting medications?: No    Lack of Transportation (Non-Medical): No  Physical Activity: Sufficiently Active (07/10/2017)   Received from [facility], [facility]   Exercise Vital Sign    Days of Exercise per Week: 5 days    Minutes of Exercise per Session: 60 min  Stress: Stress Concern Present (07/10/2017)   Received from [facility], [facility]   [employer] of Occupational Health - Occupational Stress Questionnaire    Feeling of Stress : Rather much  Social Connections: Unknown (07/10/2017)   Received from [facility], [facility]   Social Connection and Isolation Panel [NHANES]    Frequency of Communication with Friends and Family: Patient declined    Frequency of Social Gatherings with Friends and Family: Patient declined    Attends Religious Services: Patient declined    Active Member of Clubs or Organizations: Patient declined    Attends Engineer, structural: Patient declined    Marital Status: Patient declined  Catering manager Violence: Not on file   Family History:  Family History  Problem Relation Age of Onset   Cancer Father        Unknown   Prostate cancer Father    Lung cancer Neg Hx     Review of Systems: Constitutional: Doesn't report fevers, chills or abnormal weight loss Eyes: Doesn't report blurriness of vision Ears, nose, mouth, throat, and face: Doesn't report sore  throat Respiratory: Doesn't report cough, dyspnea or wheezes Cardiovascular: Doesn't report palpitation, chest discomfort  Gastrointestinal:  Doesn't report nausea, constipation, diarrhea GU: Doesn't report incontinence Skin: Doesn't report skin rashes Neurological: Per HPI Musculoskeletal: Doesn't report joint pain Behavioral/Psych: Doesn't report anxiety  Physical Exam: Vitals:   01/17/24 0906 01/17/24 0907  BP: (!) 145/64   Pulse:    Resp:    SpO2:  98%   KPS: 80. General: Alert, cooperative, pleasant, in no acute distress Head: Normal EENT: No conjunctival injection or scleral icterus.  Lungs: Resp effort normal Cardiac: Regular rate Abdomen: Non-distended abdomen Skin: No rashes cyanosis or petechiae. Extremities: No clubbing or edema  Neurologic Exam: Mental Status: Awake, alert, attentive to examiner. Oriented to self and environment. Language is fluent with intact comprehension.  Cranial Nerves: Visual acuity is grossly normal. Visual fields are full. Extra-ocular movements intact. No ptosis. Face is symmetric Motor: Tone and bulk are normal. Power is full in both arms and legs. Reflexes are symmetric, no pathologic reflexes present.  Sensory: Intact to light touch Gait: Normal.   Labs: I have reviewed the data as listed    Component Value Date/Time   NA 132 (L) 01/14/2024 0811   NA 140 05/27/2014 1712   K 4.2 01/14/2024 0811   K 3.8 05/27/2014 1712   CL 99 01/14/2024 0811   CL 105 05/27/2014 1712   CO2 26 01/14/2024 0811   CO2 28 05/27/2014 1712   GLUCOSE 135 (H) 01/14/2024 0811   GLUCOSE 120 (H) 05/27/2014 1712   BUN 24 (H) 01/14/2024 0811   BUN 11 05/27/2014 1712   CREATININE 1.04 01/14/2024 0811   CREATININE 1.02 05/27/2014 1712   CALCIUM 8.6 (L) 01/14/2024 0811   CALCIUM 8.2 (L) 05/27/2014 1712   PROT 5.9 (L) 01/14/2024 0811   ALBUMIN 3.4 (L) 01/14/2024 0811   AST 60 (H) 01/14/2024 0811   ALT 34 01/14/2024 0811   ALKPHOS 231 (H) 01/14/2024 2956  BILITOT 0.7 01/14/2024 0811   GFRNONAA >60 01/14/2024 0811   GFRNONAA >60 05/27/2014 1712   GFRAA >60 09/18/2018 0338   GFRAA >60 05/27/2014 1712   Lab Results  Component Value Date   WBC 11.1 (H) 01/14/2024   NEUTROABS 8.3 (H) 01/14/2024   HGB 12.2 (L) 01/14/2024   HCT 36.2 (L) 01/14/2024   MCV 87.4 01/14/2024   PLT 274 01/14/2024    Imaging:  US  Venous Img Lower Bilateral Result Date: 01/15/2024 CLINICAL DATA:  Bilateral lower extremity edema. EXAM: BILATERAL LOWER EXTREMITY VENOUS DOPPLER ULTRASOUND TECHNIQUE: Gray-scale sonography with graded compression, as well as color Doppler and duplex ultrasound were performed to evaluate the lower extremity deep venous systems from the level of the common femoral vein and including the common femoral, femoral, profunda femoral, popliteal and calf veins including the posterior tibial, peroneal and gastrocnemius veins when visible. The superficial great saphenous vein was also interrogated. Spectral Doppler was utilized to evaluate flow at rest and with distal augmentation maneuvers in the common femoral, femoral and popliteal veins. COMPARISON:  None Available. FINDINGS: RIGHT LOWER EXTREMITY Common Femoral Vein: No evidence of thrombus. Normal compressibility, respiratory phasicity and response to augmentation. Saphenofemoral Junction: No evidence of thrombus. Normal compressibility and flow on color Doppler imaging. Profunda Femoral Vein: No evidence of thrombus. Normal compressibility and flow on color Doppler imaging. Femoral Vein: No evidence of thrombus. Normal compressibility, respiratory phasicity and response to augmentation. Popliteal Vein: No evidence of thrombus. Normal compressibility, respiratory phasicity and response to augmentation. Calf Veins: No evidence of thrombus. Normal compressibility and flow on color Doppler imaging. Other Findings:  None. LEFT LOWER EXTREMITY Common Femoral Vein: No evidence of thrombus. Normal  compressibility, respiratory phasicity and response to augmentation. Saphenofemoral Junction: No evidence of thrombus. Normal compressibility and flow on color Doppler imaging. Profunda Femoral Vein: No evidence of thrombus. Normal compressibility and flow on color Doppler imaging. Femoral Vein: No evidence of thrombus. Normal compressibility, respiratory phasicity and response to augmentation. Popliteal Vein: No evidence of thrombus. Normal compressibility, respiratory phasicity and response to augmentation. Calf Veins: No evidence of thrombus. Normal compressibility and flow on color Doppler imaging. Other Findings:  None. IMPRESSION: No evidence of deep venous thrombosis in either lower extremity. Electronically Signed   By: Donnal Fusi M.D.   On: 01/15/2024 09:13   MR Brain W Wo Contrast Result Date: 12/18/2023 CLINICAL DATA:  Metastatic disease evaluation.  SRS planning. EXAM: MRI HEAD WITHOUT AND WITH CONTRAST TECHNIQUE: Multiplanar, multiecho pulse sequences of the brain and surrounding structures were obtained without and with intravenous contrast. CONTRAST:  6mL GADAVIST  GADOBUTROL  1 MMOL/ML IV SOLN COMPARISON:  12/10/2023 FINDINGS: Brain: Redemonstration of a 10 mm centrally necrotic metastasis at the left frontoparietal vertex with mild surrounding edema. No second lesion identified. No abnormality of the brainstem or cerebellum. Cerebral hemispheres otherwise show mild chronic small-vessel ischemic change of the white matter, less than often seen at this age. No hydrocephalus or extra-axial collection. Vascular: Major vessels at the base of the brain show flow. Skull and upper cervical spine: Negative Sinuses/Orbits: Clear/normal Other: None IMPRESSION: Redemonstration of a 10 mm centrally necrotic metastasis at the left frontoparietal vertex with mild surrounding edema. No second lesion. Electronically Signed   By: Bettylou Brunner M.D.   On: 12/18/2023 13:45     Assessment/Plan Metastasis to  brain [facility])  Tony Mitchell is clinically stable today, now having completed SRS for squamous cell lung oligometastasis.  No focal complaints at this time.  He has mild neuropathic changes likely from chemotherapy.  We encouraged exercise, brain games, healthy diet.  Recommended continuing imaging surveillance at this time without steroids.  He is agreeable with this.  We spent twenty additional minutes teaching regarding the natural history, biology, and historical experience in the treatment of neurologic complications of cancer.   We appreciate the opportunity to participate in the care of Tony Mitchell.   We ask that Tony Mitchell return to clinic in 3 months following next brain MRI, or sooner as needed.  All questions were answered. The patient knows to call the clinic with any problems, questions or concerns. No barriers to learning were detected.  The total time spent in the encounter was 40 minutes and more than 50% was on counseling and review of test results   Mamie Searles, MD Medical Director of Neuro-Oncology [facility] 01/17/24 9:01 AM

## 2024-01-20 ENCOUNTER — Telehealth: Payer: Self-pay | Admitting: *Deleted

## 2024-01-20 NOTE — Telephone Encounter (Signed)
 Pt called in to report side effects from treatment. States that he is starting to have joint pain and specifically in his right knee which is preventing him from walking very well and performing daily tasks. 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 Daun Epstein for and receives injections to help manage his inflammation. Pt is asking if Dr. Wilhelmenia Harada would reconsider for him to receive an injection in his right knee so he can walk and perform tasks at home. Pt has an appt with Dr. Daun Epstein on 6/11.  Also, pt states that his swelling has not improved since starting the fluid pill. He wasn't sure if he needed to do anything differently or continue to monitor his swelling for now.   Please advise.

## 2024-01-21 NOTE — Telephone Encounter (Signed)
Dr. Yu please advise  

## 2024-01-21 NOTE — Telephone Encounter (Signed)
 Dr. Wilhelmenia Harada agrees for Tony Mitchell to get injection but would like for Tony Mitchell to come and get CBC checked the day prior. He is currently scheduled for injectio on 6/11. Please schedule lab on 6/10 @ 10:30a.   Dr. Wilhelmenia Harada would like for Tony Mitchell to see Josh in Person for further eval on swelling. Tony Mitchell cannot come in to see josh on 6/5, he can come in tomorrow at 11. Heather please advise if [name] will not be able to see Tony Mitchell on 6/4 @ 11am.

## 2024-01-22 ENCOUNTER — Encounter: Payer: Self-pay | Admitting: Hospice and Palliative Medicine

## 2024-01-22 ENCOUNTER — Inpatient Hospital Stay: Attending: Oncology | Admitting: Hospice and Palliative Medicine

## 2024-01-22 ENCOUNTER — Inpatient Hospital Stay

## 2024-01-22 ENCOUNTER — Other Ambulatory Visit: Payer: Self-pay | Admitting: *Deleted

## 2024-01-22 ENCOUNTER — Other Ambulatory Visit: Payer: Self-pay

## 2024-01-22 VITALS — BP 132/77 | HR 73 | Temp 97.6°F | Resp 18 | Ht 69.5 in | Wt 151.0 lb

## 2024-01-22 DIAGNOSIS — C3492 Malignant neoplasm of unspecified part of left bronchus or lung: Secondary | ICD-10-CM

## 2024-01-22 DIAGNOSIS — I712 Thoracic aortic aneurysm, without rupture, unspecified: Secondary | ICD-10-CM | POA: Diagnosis not present

## 2024-01-22 DIAGNOSIS — R609 Edema, unspecified: Secondary | ICD-10-CM

## 2024-01-22 DIAGNOSIS — E785 Hyperlipidemia, unspecified: Secondary | ICD-10-CM | POA: Insufficient documentation

## 2024-01-22 DIAGNOSIS — I129 Hypertensive chronic kidney disease with stage 1 through stage 4 chronic kidney disease, or unspecified chronic kidney disease: Secondary | ICD-10-CM | POA: Diagnosis not present

## 2024-01-22 DIAGNOSIS — Z85828 Personal history of other malignant neoplasm of skin: Secondary | ICD-10-CM | POA: Diagnosis not present

## 2024-01-22 DIAGNOSIS — K219 Gastro-esophageal reflux disease without esophagitis: Secondary | ICD-10-CM | POA: Diagnosis not present

## 2024-01-22 DIAGNOSIS — E538 Deficiency of other specified B group vitamins: Secondary | ICD-10-CM | POA: Insufficient documentation

## 2024-01-22 DIAGNOSIS — M109 Gout, unspecified: Secondary | ICD-10-CM | POA: Diagnosis not present

## 2024-01-22 DIAGNOSIS — Z87891 Personal history of nicotine dependence: Secondary | ICD-10-CM | POA: Insufficient documentation

## 2024-01-22 DIAGNOSIS — Z79899 Other long term (current) drug therapy: Secondary | ICD-10-CM | POA: Diagnosis not present

## 2024-01-22 DIAGNOSIS — C7931 Secondary malignant neoplasm of brain: Secondary | ICD-10-CM | POA: Diagnosis not present

## 2024-01-22 DIAGNOSIS — I2510 Atherosclerotic heart disease of native coronary artery without angina pectoris: Secondary | ICD-10-CM | POA: Diagnosis not present

## 2024-01-22 DIAGNOSIS — C7951 Secondary malignant neoplasm of bone: Secondary | ICD-10-CM | POA: Diagnosis not present

## 2024-01-22 DIAGNOSIS — E1122 Type 2 diabetes mellitus with diabetic chronic kidney disease: Secondary | ICD-10-CM | POA: Insufficient documentation

## 2024-01-22 DIAGNOSIS — N1832 Chronic kidney disease, stage 3b: Secondary | ICD-10-CM | POA: Diagnosis not present

## 2024-01-22 DIAGNOSIS — R600 Localized edema: Secondary | ICD-10-CM | POA: Diagnosis not present

## 2024-01-22 DIAGNOSIS — Z5111 Encounter for antineoplastic chemotherapy: Secondary | ICD-10-CM | POA: Diagnosis present

## 2024-01-22 DIAGNOSIS — Z5112 Encounter for antineoplastic immunotherapy: Secondary | ICD-10-CM | POA: Insufficient documentation

## 2024-01-22 DIAGNOSIS — C787 Secondary malignant neoplasm of liver and intrahepatic bile duct: Secondary | ICD-10-CM | POA: Diagnosis not present

## 2024-01-22 DIAGNOSIS — G4730 Sleep apnea, unspecified: Secondary | ICD-10-CM | POA: Diagnosis not present

## 2024-01-22 DIAGNOSIS — R748 Abnormal levels of other serum enzymes: Secondary | ICD-10-CM | POA: Insufficient documentation

## 2024-01-22 DIAGNOSIS — J449 Chronic obstructive pulmonary disease, unspecified: Secondary | ICD-10-CM | POA: Diagnosis not present

## 2024-01-22 LAB — CBC WITH DIFFERENTIAL (CANCER CENTER ONLY)
Abs Immature Granulocytes: 3.84 10*3/uL — ABNORMAL HIGH (ref 0.00–0.07)
Basophils Absolute: 0.1 10*3/uL (ref 0.0–0.1)
Basophils Relative: 0 %
Eosinophils Absolute: 0 10*3/uL (ref 0.0–0.5)
Eosinophils Relative: 0 %
HCT: 36.5 % — ABNORMAL LOW (ref 39.0–52.0)
Hemoglobin: 12.4 g/dL — ABNORMAL LOW (ref 13.0–17.0)
Immature Granulocytes: 14 %
Lymphocytes Relative: 11 %
Lymphs Abs: 2.9 10*3/uL (ref 0.7–4.0)
MCH: 29.5 pg (ref 26.0–34.0)
MCHC: 34 g/dL (ref 30.0–36.0)
MCV: 86.7 fL (ref 80.0–100.0)
Monocytes Absolute: 2.8 10*3/uL — ABNORMAL HIGH (ref 0.1–1.0)
Monocytes Relative: 10 %
Neutro Abs: 17.6 10*3/uL — ABNORMAL HIGH (ref 1.7–7.7)
Neutrophils Relative %: 65 %
Platelet Count: 252 10*3/uL (ref 150–400)
RBC: 4.21 MIL/uL — ABNORMAL LOW (ref 4.22–5.81)
RDW: 14.5 % (ref 11.5–15.5)
Smear Review: NORMAL
WBC Count: 27.2 10*3/uL — ABNORMAL HIGH (ref 4.0–10.5)
nRBC: 0.2 % (ref 0.0–0.2)

## 2024-01-22 LAB — BASIC METABOLIC PANEL - CANCER CENTER ONLY
Anion gap: 10 (ref 5–15)
BUN: 21 mg/dL (ref 8–23)
CO2: 26 mmol/L (ref 22–32)
Calcium: 8.7 mg/dL — ABNORMAL LOW (ref 8.9–10.3)
Chloride: 95 mmol/L — ABNORMAL LOW (ref 98–111)
Creatinine: 0.82 mg/dL (ref 0.61–1.24)
GFR, Estimated: >60 mL/min (ref >60)
Glucose, Bld: 114 mg/dL — ABNORMAL HIGH (ref 70–99)
Potassium: 4.5 mmol/L (ref 3.5–5.1)
Sodium: 131 mmol/L — ABNORMAL LOW (ref 135–145)

## 2024-01-22 MED ORDER — CEPHALEXIN 500 MG PO CAPS: 500.0000 mg | ORAL_CAPSULE | Freq: Two times a day (BID) | ORAL | 0 refills | Status: DC

## 2024-01-22 NOTE — Progress Notes (Signed)
 Symptom Management Clinic [facility] Telephone:([phone]) [phone] Fax:(336) [phone]  Patient Care Team: Antonio Baumgarten, MD as PCP - General (Internal Medicine) [name], MD as Consulting Physician (Oncology) Drake Gens, RN as Oncology Nurse Navigator   NAME OF PATIENT: Tony Mitchell  [id number]  10-24-1940   DATE OF VISIT: 01/22/24  REASON FOR CONSULT: Tony Mitchell is a 83 y.o. 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   INTERVAL HISTORY: Patient on treatment with carboplatin  Taxol  and Keytruda .    Patient recently seen by Dr. Wilhelmenia Harada on 01/14/2024 at which time patient was noted to have bilateral lower extremity edema.  Patient was sent for lower extremity Dopplers, which were negative for DVT.  Patient was started on furosemide  20 mg daily.  Patient presents [facility] today for follow-up on his lower extremity edema.  Patient feels like his left lower extremity swelling has improved some.  However, he feels like his right lower extremity edema is still prominent.  Patient also endorses redness and warmth on his right leg over the past week.  Denies any fever or chills.  Denies purulence or drainage.  Patient reports mild fatigue but denies any exertional chest pain.  He is able to climb steps without difficulty.  Denies any shortness of breath with position changes or while lying flat.  No changes in urinary output.  Patient says that he has ordered compression stockings but has not yet used them.  Denies any neurologic complaints. Denies recent fevers or illnesses. Denies any easy bleeding or bruising. Reports good appetite and denies weight loss. Denies chest pain. Denies any nausea, vomiting, constipation, or diarrhea. Denies urinary complaints. Patient offers no further specific complaints today.   PAST MEDICAL HISTORY: Past Medical History:  Diagnosis Date   Acute appendicitis  03/03/2018   Arthritis    Cancer (HCC)    skin cancer   Chronic kidney disease, stage 3b (HCC)    Collapsed lung 2019   x2   COPD (chronic obstructive pulmonary disease) (HCC)    Coronary artery disease    Diabetes mellitus without complication (HCC)    diet controlled-lost 60 lbs and is not on any current meds   GERD (gastroesophageal reflux disease)    Gout    Hyperlipidemia    Hypertension    Sleep apnea    uses cpap   Thoracic aortic aneurysm (HCC)    Vitamin B 12 deficiency     PAST SURGICAL HISTORY:  Past Surgical History:  Procedure Laterality Date   CARDIAC CATHETERIZATION     CATARACT EXTRACTION  2012   CHEST TUBE INSERTION     x2   COLONOSCOPY WITH PROPOFOL  N/A 04/14/2021   Procedure: COLONOSCOPY WITH PROPOFOL ;  Surgeon: Shane Darling, MD;  Location: [facility] ENDOSCOPY;  Service: Endoscopy;  Laterality: N/A;   ESOPHAGOGASTRODUODENOSCOPY (EGD) WITH PROPOFOL  N/A 04/14/2021   Procedure: ESOPHAGOGASTRODUODENOSCOPY (EGD) WITH PROPOFOL ;  Surgeon: Shane Darling, MD;  Location: [facility] ENDOSCOPY;  Service: Endoscopy;  Laterality: N/A;   INSERTION OF MESH  09/22/2021   Procedure: INSERTION OF MESH;  Surgeon: Conrado Delay, DO;  Location: [facility] ORS;  Service: General;;   JOINT REPLACEMENT     KNEE SURGERY Left 1965   LAPAROSCOPIC APPENDECTOMY N/A 03/03/2018   Procedure: APPENDECTOMY LAPAROSCOPIC;  Surgeon: Franki Isles, MD;  Location: [facility] ORS;  Service: General;  Laterality: N/A;   TOTAL KNEE ARTHROPLASTY Left 09/16/2018   Procedure: TOTAL KNEE ARTHROPLASTY;  Surgeon: Elner Hahn, MD;  Location: [facility] ORS;  Service: Orthopedics;  Laterality: Left;    HEMATOLOGY/ONCOLOGY HISTORY:  Oncology History  Squamous cell carcinoma lung, left (HCC)  11/22/2023 Imaging   CT abdomen pelvis w contrast  1. Multiple hepatic metastatic lesions. Correlation with history of known malignancy recommended. 2. Serosal implant along the distal stomach versus  gastrohepatic adenopathy. 3. Thickened appearance of the gastric antrum and pylorus likely related to underdistention. An infiltrative mass is less likely but not excluded. Endoscopy may provide better evaluation. 4. Herniation of a segment of the sigmoid colon into the left inguinal canal. No bowel obstruction.     12/04/2023 Imaging   PET scan showed  Extensive hypermetabolic neoplastic disease including left lower lobe infiltrative mass extending from the pleura to the hilum with numerous abnormal left-sided hilar and mediastinal nodes.   Additional upper retroperitoneal nodes as well as extensive liver metastases.   Multifocal osseous metastatic disease including the thoracic and lumbar spine, pelvis, right humeral head and left scapula.   Based on the overall appearance and with the patient's laboratory a abnormality this very well could be a primary left lower lobe lung lesion such as small cell versus a neuroendocrine tumor with diffuse metastatic disease.   12/10/2023 Imaging   MRI brain w wo contrast  1. 10 x 9 x 10 mm rounded lesion in the posterior left frontal lobe with surrounding vasogenic edema is consistent with metastatic disease. 2. No other pathologic enhancement is present. 3. Periventricular and scattered subcortical T2 hyperintensities bilaterally are mildly advanced for age. The finding is nonspecific but can be seen in the setting of chronic microvascular ischemia, a demyelinating process such as multiple sclerosis, vasculitis, complicated migraine headaches, or as the sequelae of a prior infectious or inflammatory process.   12/17/2023 Initial Diagnosis   Squamous cell carcinoma lung, left (HCC)  He began experiencing abdominal pain for a week.  Initially presenting as increased gas and stomach growling despite having eaten. The pain was localized between the rib cage and the abdomen and described as intermittent.  Later the pain had resolved, but he  continues to feel bloated. He has CT abdomen pelvis done which showed liver masses.   Liver biopsy showed  1. Liver, biopsy, right hepatic lesion :      - METASTATIC POORLY DIFFERENTIATED SQUAMOUS CELL CARCINOMA WITH NECROSIS.  Diagnosis Note : Per [facility] a recent PET scan revealed a hypermetabolic left lower lobe lung mass with extensive metastatic lesions. The carcinoma is positive for cytokeratin 7, p40 (greater than 50%), and CD56 (subset). Cytokeratin 20, TTF-1,  chromogranin, synaptophysin, and CDX-2 are negative. In the absence of  chromogranin / synaptophysin staining the presence of patchy CD56 staining is interpreted as non-specific. The pattern of immunoreactivity is consistent with metastatic poorly differentiated squamous cell carcinoma.     12/17/2023 Cancer Staging   Staging form: Lung, AJCC V9 - Clinical stage from 12/17/2023: [name], pM1c - Signed by [name], MD on 12/17/2023 Histopathologic type: Squamous cell carcinoma, NOS Stage prefix: Initial diagnosis Laterality: Left Sites of metastasis: Bone, Liver, Brain   12/23/2023 - 12/25/2023 Chemotherapy   Patient is on Treatment Plan : LUNG Carboplatin  + Paclitaxel  q21d     01/14/2024 -  Chemotherapy   Patient is on Treatment Plan : LUNG NSCLC Carboplatin  (6) + Paclitaxel  (200) + Pembrolizumab  (200) D1 q21d x 4 cycles / Pembrolizumab  (200) Maintenance D1 q21d       ALLERGIES:  has no known allergies.  MEDICATIONS:  Current Outpatient Medications  Medication Sig Dispense Refill   allopurinol  (ZYLOPRIM ) 100 MG tablet Take 100 mg by mouth in the morning and at bedtime.     ALPRAZolam  (XANAX ) 0.5 MG tablet Take 0.5 mg by mouth at bedtime.     amLODipine  (NORVASC ) 2.5 MG tablet Take 2.5 mg by mouth in the morning.     fenofibrate  (TRICOR ) 145 MG tablet Take 145 mg by mouth 2 (two) times a week. On Wednesday and Sunday     finasteride  (PROSCAR ) 5 MG tablet Take 1 tablet (5 mg total) by mouth daily. 90 tablet 3   furosemide   (LASIX ) 20 MG tablet Take 1 tablet (20 mg total) by mouth daily as needed. 30 tablet 0   gabapentin  (NEURONTIN ) 100 MG capsule Take 100 mg by mouth.     hydrALAZINE  (APRESOLINE ) 25 MG tablet Take 25 mg by mouth 2 (two) times daily.     lansoprazole (PREVACID) 15 MG capsule Take 15 mg by mouth every evening.     oxyCODONE  (OXY IR/ROXICODONE ) 5 MG immediate release tablet Take 1 tablet (5 mg total) by mouth every 6 (six) hours as needed for severe pain (pain score 7-10) or breakthrough pain. 10 tablet 0   simvastatin  (ZOCOR ) 20 MG tablet Take 1 tablet by mouth 2 (two) times a week. On  Wednesday and Sunday     tamsulosin  (FLOMAX ) 0.4 MG CAPS capsule Take 1 capsule (0.4 mg total) by mouth daily. 30 capsule 11   vitamin B-12 (CYANOCOBALAMIN ) 1000 MCG tablet Take 1,000 mcg by mouth every morning.     Vitamin D, Ergocalciferol, (DRISDOL) 1.25 MG (50000 UNIT) CAPS capsule Take 50,000 Units by mouth every 7 (seven) days.     colchicine 0.6 MG tablet Take 2 tablets (1.2mg ) by mouth at first sign of gout flare followed by 1 tablet (0.6mg ) after 1 hour. (Max 1.8mg  within 1 hour) (Patient not taking: Reported on 12/12/2023)     prochlorperazine  (COMPAZINE ) 10 MG tablet Take 1 tablet (10 mg total) by mouth every 6 (six) hours as needed for nausea or vomiting. (Patient not taking: Reported on 01/22/2024) 30 tablet 1   triamcinolone  ointment (KENALOG ) 0.5 % Apply 1 Application topically 2 (two) times daily. (Patient not taking: Reported on 12/12/2023) 30 g 0   No current facility-administered medications for this visit.    VITAL SIGNS: BP 132/77   Pulse 73   Temp 97.6 F (36.4 C) (Tympanic)   Resp 18  There were no vitals filed for this visit.  Estimated body mass index is 21.98 kg/m as calculated from the following:   Height as of 01/17/24: 5' 9.5" (1.765 m).   Weight as of 01/17/24: 151 lb (68.5 kg).  LABS: CBC:    Component Value Date/Time   WBC 27.2 (H) 01/22/2024 1104   WBC 6.0 12/12/2023 1015    HGB 12.4 (L) 01/22/2024 1104   HGB 13.3 05/27/2014 1712   HCT 36.5 (L) 01/22/2024 1104   HCT 40.9 05/27/2014 1712   PLT 252 01/22/2024 1104   PLT 198 05/27/2014 1712   MCV 86.7 01/22/2024 1104   MCV 92 05/27/2014 1712   NEUTROABS PENDING 01/22/2024 1104   LYMPHSABS PENDING 01/22/2024 1104   MONOABS PENDING 01/22/2024 1104   EOSABS PENDING 01/22/2024 1104   BASOSABS PENDING 01/22/2024 1104   Comprehensive Metabolic Panel:    Component Value Date/Time   NA 131 (L) 01/22/2024 1103   NA 140 05/27/2014 1712   K 4.5 01/22/2024 1103  K 3.8 05/27/2014 1712   CL 95 (L) 01/22/2024 1103   CL 105 05/27/2014 1712   CO2 26 01/22/2024 1103   CO2 28 05/27/2014 1712   BUN 21 01/22/2024 1103   BUN 11 05/27/2014 1712   CREATININE 0.82 01/22/2024 1103   CREATININE 1.02 05/27/2014 1712   GLUCOSE 114 (H) 01/22/2024 1103   GLUCOSE 120 (H) 05/27/2014 1712   CALCIUM 8.7 (L) 01/22/2024 1103   CALCIUM 8.2 (L) 05/27/2014 1712   AST 60 (H) 01/14/2024 0811   ALT 34 01/14/2024 0811   ALKPHOS 231 (H) 01/14/2024 0811   BILITOT 0.7 01/14/2024 0811   PROT 5.9 (L) 01/14/2024 0811   ALBUMIN 3.4 (L) 01/14/2024 0811    RADIOGRAPHIC STUDIES: US  Venous Img Lower Bilateral Result Date: 01/15/2024 CLINICAL DATA:  Bilateral lower extremity edema. EXAM: BILATERAL LOWER EXTREMITY VENOUS DOPPLER ULTRASOUND TECHNIQUE: Gray-scale sonography with graded compression, as well as color Doppler and duplex ultrasound were performed to evaluate the lower extremity deep venous systems from the level of the common femoral vein and including the common femoral, femoral, profunda femoral, popliteal and calf veins including the posterior tibial, peroneal and gastrocnemius veins when visible. The superficial great saphenous vein was also interrogated. Spectral Doppler was utilized to evaluate flow at rest and with distal augmentation maneuvers in the common femoral, femoral and popliteal veins. COMPARISON:  None Available. FINDINGS:  RIGHT LOWER EXTREMITY Common Femoral Vein: No evidence of thrombus. Normal compressibility, respiratory phasicity and response to augmentation. Saphenofemoral Junction: No evidence of thrombus. Normal compressibility and flow on color Doppler imaging. Profunda Femoral Vein: No evidence of thrombus. Normal compressibility and flow on color Doppler imaging. Femoral Vein: No evidence of thrombus. Normal compressibility, respiratory phasicity and response to augmentation. Popliteal Vein: No evidence of thrombus. Normal compressibility, respiratory phasicity and response to augmentation. Calf Veins: No evidence of thrombus. Normal compressibility and flow on color Doppler imaging. Other Findings:  None. LEFT LOWER EXTREMITY Common Femoral Vein: No evidence of thrombus. Normal compressibility, respiratory phasicity and response to augmentation. Saphenofemoral Junction: No evidence of thrombus. Normal compressibility and flow on color Doppler imaging. Profunda Femoral Vein: No evidence of thrombus. Normal compressibility and flow on color Doppler imaging. Femoral Vein: No evidence of thrombus. Normal compressibility, respiratory phasicity and response to augmentation. Popliteal Vein: No evidence of thrombus. Normal compressibility, respiratory phasicity and response to augmentation. Calf Veins: No evidence of thrombus. Normal compressibility and flow on color Doppler imaging. Other Findings:  None. IMPRESSION: No evidence of deep venous thrombosis in either lower extremity. Electronically Signed   By: Donnal Fusi M.D.   On: 01/15/2024 09:13    PERFORMANCE STATUS (ECOG) : 1 - Symptomatic but completely ambulatory  Review of Systems Unless otherwise noted, a complete review of systems is negative.  Physical Exam General: NAD Cardiovascular: regular rate and rhythm Pulmonary: clear ant fields Abdomen: soft, nontender, + bowel sounds GU: no suprapubic tenderness Extremities: no edema, no joint  deformities Skin: no rashes Neurological: Weakness but otherwise nonfocal  IMPRESSION/PLAN: Stage IV NSCLC - on systemic chemotherapy/immunotherapy  BLE edema -weight stable.  No overt indication of heart failure.  Recent BNP was normal.  Patient with negative Dopplers.  Renal function normal.  Patient does have history of mild hypoalbuminemia, which may be a contributing factor.  However, today right lower extremity is noted to have redness and warmth.  Concern for cellulitis as patient also has an area of abrasion to his lateral malleolus.  Will start empirically on cephalexin 500mg   BID x 7 days.   Case and plan discussed with Dr. Wilhelmenia Harada.    Patient expressed understanding and was in agreement with this plan. He also understands that He can call clinic at any time with any questions, concerns, or complaints.   Thank you for allowing me to participate in the care of this very pleasant patient.   Time Total: 15 minutes  Visit consisted of counseling and education dealing with the complex and emotionally intense issues of symptom management in the setting of serious illness.Greater than 50%  of this time was spent counseling and coordinating care related to the above assessment and plan.  Signed by: Gerilyn Kobus, PhD, NP-C

## 2024-01-22 NOTE — Telephone Encounter (Signed)
 Contacted pt. - Patient confirmed apt w/Josh 6/4 @ 11am

## 2024-01-23 ENCOUNTER — Inpatient Hospital Stay: Attending: Oncology | Admitting: Hospice and Palliative Medicine

## 2024-01-24 ENCOUNTER — Encounter: Payer: Self-pay | Admitting: Oncology

## 2024-01-27 ENCOUNTER — Other Ambulatory Visit: Payer: Self-pay | Admitting: *Deleted

## 2024-01-27 DIAGNOSIS — C3492 Malignant neoplasm of unspecified part of left bronchus or lung: Secondary | ICD-10-CM

## 2024-01-28 ENCOUNTER — Inpatient Hospital Stay

## 2024-01-28 DIAGNOSIS — C3492 Malignant neoplasm of unspecified part of left bronchus or lung: Secondary | ICD-10-CM

## 2024-01-28 DIAGNOSIS — Z5111 Encounter for antineoplastic chemotherapy: Secondary | ICD-10-CM | POA: Diagnosis not present

## 2024-01-28 LAB — CBC WITH DIFFERENTIAL (CANCER CENTER ONLY)
Abs Immature Granulocytes: 0.73 10*3/uL — ABNORMAL HIGH (ref 0.00–0.07)
Basophils Absolute: 0.1 10*3/uL (ref 0.0–0.1)
Basophils Relative: 1 %
Eosinophils Absolute: 0 10*3/uL (ref 0.0–0.5)
Eosinophils Relative: 0 %
HCT: 37.6 % — ABNORMAL LOW (ref 39.0–52.0)
Hemoglobin: 12.6 g/dL — ABNORMAL LOW (ref 13.0–17.0)
Immature Granulocytes: 4 %
Lymphocytes Relative: 14 %
Lymphs Abs: 2.4 10*3/uL (ref 0.7–4.0)
MCH: 29.9 pg (ref 26.0–34.0)
MCHC: 33.5 g/dL (ref 30.0–36.0)
MCV: 89.3 fL (ref 80.0–100.0)
Monocytes Absolute: 1.1 10*3/uL — ABNORMAL HIGH (ref 0.1–1.0)
Monocytes Relative: 6 %
Neutro Abs: 13.2 10*3/uL — ABNORMAL HIGH (ref 1.7–7.7)
Neutrophils Relative %: 75 %
Platelet Count: 234 10*3/uL (ref 150–400)
RBC: 4.21 MIL/uL — ABNORMAL LOW (ref 4.22–5.81)
RDW: 15.9 % — ABNORMAL HIGH (ref 11.5–15.5)
WBC Count: 17.6 10*3/uL — ABNORMAL HIGH (ref 4.0–10.5)
nRBC: 0.1 % (ref 0.0–0.2)

## 2024-01-30 ENCOUNTER — Encounter: Payer: Self-pay | Admitting: Radiation Oncology

## 2024-01-30 ENCOUNTER — Ambulatory Visit
Admission: RE | Admit: 2024-01-30 | Discharge: 2024-01-30 | Disposition: A | Discharge status: Home / Self Care | Source: Ambulatory Visit | Attending: Radiation Oncology | Admitting: Radiation Oncology

## 2024-01-30 VITALS — BP 144/78 | HR 91 | Temp 98.0°F | Resp 16 | Wt 151.0 lb

## 2024-01-30 DIAGNOSIS — Z923 Personal history of irradiation: Secondary | ICD-10-CM | POA: Insufficient documentation

## 2024-01-30 DIAGNOSIS — R609 Edema, unspecified: Secondary | ICD-10-CM | POA: Diagnosis not present

## 2024-01-30 DIAGNOSIS — C7931 Secondary malignant neoplasm of brain: Secondary | ICD-10-CM | POA: Diagnosis present

## 2024-01-30 DIAGNOSIS — C3492 Malignant neoplasm of unspecified part of left bronchus or lung: Secondary | ICD-10-CM | POA: Diagnosis not present

## 2024-01-30 DIAGNOSIS — Z5111 Encounter for antineoplastic chemotherapy: Secondary | ICD-10-CM | POA: Diagnosis not present

## 2024-01-30 NOTE — Progress Notes (Signed)
 Radiation Oncology Follow up Note  Name: Tony Mitchell   Date:   01/30/2024 MRN:  [id number] DOB: Dec 03, 1940    This 83 y.o. male presents to the clinic today for 1 month follow-up status post SRS for solitary brain lesion and patient with known stage IV squamous of carcinoma of the lung.  REFERRING PROVIDER: Antonio Baumgarten, MD  HPI: Patient is an 83 year old male now out 1 month having completed SRS for solitary brain metastasis from stage IV squamous of carcinoma the lung.Tony Mitchell  He is seen today in routine follow-up is doing well has no change in his neurologic status no headaches no change in visual fields.  He is currently being treated with Taxol  and Keytruda  which is appear to be tolerating well he has noted increase in his pulse rate nothing significant abnormal.  Has also treated for some lower extremity edema with diuretics.  COMPLICATIONS OF TREATMENT: none  FOLLOW UP COMPLIANCE: keeps appointments   PHYSICAL EXAM:  BP (!) 144/78 Comment: Patient monitors at home ususall WNL  Pulse 91   Temp 98 F (36.7 C) (Tympanic)   Resp 16   Wt 151 lb (68.5 kg)   BMI 21.98 kg/m  Well-developed well-nourished patient in NAD. HEENT reveals PERLA, EOMI, discs not visualized.  Oral cavity is clear. No oral mucosal lesions are identified. Neck is clear without evidence of cervical or supraclavicular adenopathy. Lungs are clear to A&P. Cardiac examination is essentially unremarkable with regular rate and rhythm without murmur rub or thrill. Abdomen is benign with no organomegaly or masses noted. Motor sensory and DTR levels are equal and symmetric in the upper and lower extremities. Cranial nerves II through XII are grossly intact. Proprioception is intact. No peripheral adenopathy or edema is identified. No motor or sensory levels are noted. Crude visual fields are within normal range.  RADIOLOGY RESULTS: No current films for review  PLAN: Present time patient is doing well 1 month out from  [facility].  He has a MRI scheduled in August I will see him back in 3 months for routine follow-up.  Continues close follow-up With Dr. Mark Sil as well as under treatment with medical oncology.  Patient knows to call with any concerns.  I would like to take this opportunity to thank you for allowing me to participate in the care of your patient.Glenis Langdon, MD

## 2024-02-04 ENCOUNTER — Inpatient Hospital Stay

## 2024-02-04 ENCOUNTER — Encounter: Payer: Self-pay | Admitting: Oncology

## 2024-02-04 ENCOUNTER — Inpatient Hospital Stay (HOSPITAL_BASED_OUTPATIENT_CLINIC_OR_DEPARTMENT_OTHER): Admitting: Oncology

## 2024-02-04 VITALS — BP 143/68 | HR 81 | Temp 97.8°F | Resp 18 | Wt 147.1 lb

## 2024-02-04 VITALS — BP 165/68

## 2024-02-04 DIAGNOSIS — R634 Abnormal weight loss: Secondary | ICD-10-CM | POA: Diagnosis not present

## 2024-02-04 DIAGNOSIS — C3492 Malignant neoplasm of unspecified part of left bronchus or lung: Secondary | ICD-10-CM | POA: Diagnosis not present

## 2024-02-04 DIAGNOSIS — R600 Localized edema: Secondary | ICD-10-CM

## 2024-02-04 DIAGNOSIS — Z5111 Encounter for antineoplastic chemotherapy: Secondary | ICD-10-CM | POA: Diagnosis not present

## 2024-02-04 DIAGNOSIS — C7931 Secondary malignant neoplasm of brain: Secondary | ICD-10-CM

## 2024-02-04 DIAGNOSIS — R748 Abnormal levels of other serum enzymes: Secondary | ICD-10-CM

## 2024-02-04 LAB — CBC WITH DIFFERENTIAL (CANCER CENTER ONLY)
Abs Immature Granulocytes: 0.12 10*3/uL — ABNORMAL HIGH (ref 0.00–0.07)
Basophils Absolute: 0.1 10*3/uL (ref 0.0–0.1)
Basophils Relative: 0 %
Eosinophils Absolute: 0.1 10*3/uL (ref 0.0–0.5)
Eosinophils Relative: 1 %
HCT: 38.4 % — ABNORMAL LOW (ref 39.0–52.0)
Hemoglobin: 13.2 g/dL (ref 13.0–17.0)
Immature Granulocytes: 1 %
Lymphocytes Relative: 18 %
Lymphs Abs: 2.1 10*3/uL (ref 0.7–4.0)
MCH: 30.5 pg (ref 26.0–34.0)
MCHC: 34.4 g/dL (ref 30.0–36.0)
MCV: 88.7 fL (ref 80.0–100.0)
Monocytes Absolute: 1.4 10*3/uL — ABNORMAL HIGH (ref 0.1–1.0)
Monocytes Relative: 12 %
Neutro Abs: 7.8 10*3/uL — ABNORMAL HIGH (ref 1.7–7.7)
Neutrophils Relative %: 68 %
Platelet Count: 328 10*3/uL (ref 150–400)
RBC: 4.33 MIL/uL (ref 4.22–5.81)
RDW: 16.5 % — ABNORMAL HIGH (ref 11.5–15.5)
WBC Count: 11.5 10*3/uL — ABNORMAL HIGH (ref 4.0–10.5)
nRBC: 0 % (ref 0.0–0.2)

## 2024-02-04 LAB — CMP (CANCER CENTER ONLY)
ALT: 26 U/L (ref 0–44)
AST: 25 U/L (ref 15–41)
Albumin: 3.8 g/dL (ref 3.5–5.0)
Alkaline Phosphatase: 156 U/L — ABNORMAL HIGH (ref 38–126)
Anion gap: 9 (ref 5–15)
BUN: 23 mg/dL (ref 8–23)
CO2: 26 mmol/L (ref 22–32)
Calcium: 9 mg/dL (ref 8.9–10.3)
Chloride: 98 mmol/L (ref 98–111)
Creatinine: 0.84 mg/dL (ref 0.61–1.24)
GFR, Estimated: >60 mL/min (ref >60)
Glucose, Bld: 99 mg/dL (ref 70–99)
Potassium: 4.7 mmol/L (ref 3.5–5.1)
Sodium: 133 mmol/L — ABNORMAL LOW (ref 135–145)
Total Bilirubin: 1 mg/dL (ref 0.0–1.2)
Total Protein: 6.2 g/dL — ABNORMAL LOW (ref 6.5–8.1)

## 2024-02-04 MED ORDER — SODIUM CHLORIDE 0.9 % IV SOLN
200.0000 mg | Freq: Once | INTRAVENOUS | Status: AC
  Administered 2024-02-04: 200 mg via INTRAVENOUS
  Filled 2024-02-04: qty 200

## 2024-02-04 MED ORDER — SODIUM CHLORIDE 0.9 % IV SOLN
150.0000 mg/m2 | Freq: Once | INTRAVENOUS | Status: AC
  Administered 2024-02-04: 264 mg via INTRAVENOUS
  Filled 2024-02-04: qty 44

## 2024-02-04 MED ORDER — DEXAMETHASONE SODIUM PHOSPHATE 10 MG/ML IJ SOLN
10.0000 mg | Freq: Once | INTRAMUSCULAR | Status: AC
  Administered 2024-02-04: 10 mg via INTRAVENOUS
  Filled 2024-02-04: qty 1

## 2024-02-04 MED ORDER — HEPARIN SOD (PORK) LOCK FLUSH 100 UNIT/ML IV SOLN
500.0000 [IU] | Freq: Once | INTRAVENOUS | Status: AC | PRN
  Filled 2024-02-04: qty 5

## 2024-02-04 MED ORDER — SODIUM CHLORIDE 0.9 % IV SOLN
379.5000 mg | Freq: Once | INTRAVENOUS | Status: AC
  Administered 2024-02-04: 380 mg via INTRAVENOUS
  Filled 2024-02-04: qty 38

## 2024-02-04 MED ORDER — FAMOTIDINE IN NACL 20-0.9 MG/50ML-% IV SOLN
20.0000 mg | Freq: Once | INTRAVENOUS | Status: AC
  Administered 2024-02-04: 20 mg via INTRAVENOUS
  Filled 2024-02-04: qty 50

## 2024-02-04 MED ORDER — APREPITANT 130 MG/18ML IV EMUL
130.0000 mg | Freq: Once | INTRAVENOUS | Status: AC
  Administered 2024-02-04: 130 mg via INTRAVENOUS
  Filled 2024-02-04: qty 18

## 2024-02-04 MED ORDER — SODIUM CHLORIDE 0.9 % IV SOLN
INTRAVENOUS | Status: AC
  Filled 2024-02-04 (×2): qty 250

## 2024-02-04 MED ORDER — DIPHENHYDRAMINE HCL 50 MG/ML IJ SOLN
50.0000 mg | Freq: Once | INTRAMUSCULAR | Status: AC
  Administered 2024-02-04: 50 mg via INTRAVENOUS
  Filled 2024-02-04: qty 1

## 2024-02-04 MED ORDER — PALONOSETRON HCL INJECTION 0.25 MG/5ML
0.2500 mg | Freq: Once | INTRAVENOUS | Status: AC
  Administered 2024-02-04: 0.25 mg via INTRAVENOUS
  Filled 2024-02-04: qty 5

## 2024-02-04 NOTE — Assessment & Plan Note (Signed)
 Negative DVT on bilateral LE US   Edema has improved, monitor

## 2024-02-04 NOTE — Assessment & Plan Note (Addendum)
 Imaging results and pathology results were reviewed and discussed with patient. 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 NGS showed TMB 53.2 high, PD-L1 <1%, SMARCB1- high TMB, Keytruda  was added on cycle 2 Labs are reviewed and discussed with patient. He tolerates well.  Proceed with cycle 3 Carboplatin  + Taxol  + keytruda   with D3 GCSF Repeat CT to evaluate treatment response.

## 2024-02-04 NOTE — Assessment & Plan Note (Signed)
 Likely due to liver metastasis.   Improving.

## 2024-02-04 NOTE — Assessment & Plan Note (Signed)
Follow up with nutritionist.  

## 2024-02-04 NOTE — Assessment & Plan Note (Signed)
 S/p Brain Radiation.  He was see by neurology Dr. Mark Sil.

## 2024-02-04 NOTE — Patient Instructions (Signed)
 [facility] - A DEPT OF . [facility]  Discharge Instructions: Thank you for choosing [facility] to provide your oncology and hematology care.  If you have a lab appointment with the Cancer Center, please go directly to the Cancer Center and check in at the registration area.  Wear comfortable clothing and clothing appropriate for easy access to any Portacath or PICC line.   We strive to give you quality time with your provider. You may need to reschedule your appointment if you arrive late (15 or more minutes).  Arriving late affects you and other patients whose appointments are after yours.  Also, if you miss three or more appointments without notifying the office, you may be dismissed from the clinic at the provider's discretion.      For prescription refill requests, have your pharmacy contact our office and allow 72 hours for refills to be completed.    Today you received the following chemotherapy and/or immunotherapy agents KEYTRUDA , CARBOPLATIN , TAXOL       To help prevent nausea and vomiting after your treatment, we encourage you to take your nausea medication as directed.  BELOW ARE SYMPTOMS THAT SHOULD BE REPORTED IMMEDIATELY: *FEVER GREATER THAN 100.4 F (38 C) OR HIGHER *CHILLS OR SWEATING *NAUSEA AND VOMITING THAT IS NOT CONTROLLED WITH YOUR NAUSEA MEDICATION *UNUSUAL SHORTNESS OF BREATH *UNUSUAL BRUISING OR BLEEDING *URINARY PROBLEMS (pain or burning when urinating, or frequent urination) *BOWEL PROBLEMS (unusual diarrhea, constipation, pain near the anus) TENDERNESS IN MOUTH AND THROAT WITH OR WITHOUT PRESENCE OF ULCERS (sore throat, sores in mouth, or a toothache) UNUSUAL RASH, SWELLING OR PAIN  UNUSUAL VAGINAL DISCHARGE OR ITCHING   Items with * indicate a potential emergency and should be followed up as soon as possible or go to the Emergency Department if any problems should occur.  Please show the CHEMOTHERAPY ALERT CARD  or IMMUNOTHERAPY ALERT CARD at check-in to the Emergency Department and triage nurse.  Should you have questions after your visit or need to cancel or reschedule your appointment, please contact [facility] - A DEPT OF [facility]  [phone] and follow the prompts.  Office hours are 8:00 a.m. to 4:30 p.m. Monday - Friday. Please note that voicemails left after 4:00 p.m. may not be returned until the following business day.  We are closed weekends and major holidays. You have access to a nurse at all times for urgent questions. Please call the main number to the clinic [phone] and follow the prompts.  For any non-urgent questions, you may also contact your provider using MyChart. We now offer e-Visits for anyone 84 and older to request care online for non-urgent symptoms. For details visit [email or web address].   Also download the MyChart app! Go to the app store, search MyChart, open the app, select [facility], and log in with your MyChart username and password.  Pembrolizumab  Injection What is this medication? PEMBROLIZUMAB  (PEM broe LIZ ue mab) treats some types of cancer. It works by helping your immune system slow or stop the spread of cancer cells. It is a monoclonal antibody. This medicine may be used for other purposes; ask your health care provider or pharmacist if you have questions. COMMON BRAND NAME(S): Keytruda  What should I tell my care team before I take this medication? They need to know if you have any of these conditions: Allogeneic stem cell transplant (uses someone else's stem cells) Autoimmune diseases, such as  Crohn disease, ulcerative colitis, lupus History of chest radiation Nervous system problems, such as Guillain-Barre syndrome, myasthenia gravis Organ transplant An unusual or allergic reaction to pembrolizumab , other medications, foods, dyes, or preservatives Pregnant or trying to get pregnant Breast-feeding How should  I use this medication? This medication is injected into a vein. It is given by your care team in a hospital or clinic setting. A special MedGuide will be given to you before each treatment. Be sure to read this information carefully each time. Talk to your care team about the use of this medication in children. While it may be prescribed for children as young as 6 months for selected conditions, precautions do apply. Overdosage: If you think you have taken too much of this medicine contact a poison control center or emergency room at once. NOTE: This medicine is only for you. Do not share this medicine with others. What if I miss a dose? Keep appointments for follow-up doses. It is important not to miss your dose. Call your care team if you are unable to keep an appointment. What may interact with this medication? Interactions have not been studied. This list may not describe all possible interactions. Give your health care provider a list of all the medicines, herbs, non-prescription drugs, or dietary supplements you use. Also tell them if you smoke, drink alcohol, or use illegal drugs. Some items may interact with your medicine. What should I watch for while using this medication? Your condition will be monitored carefully while you are receiving this medication. You may need blood work while taking this medication. This medication may cause serious skin reactions. They can happen weeks to months after starting the medication. Contact your care team right away if you notice fevers or flu-like symptoms with a rash. The rash may be red or purple and then turn into blisters or peeling of the skin. You may also notice a red rash with swelling of the face, lips, or lymph nodes in your neck or under your arms. Tell your care team right away if you have any change in your eyesight. Talk to your care team if you may be pregnant. Serious birth defects can occur if you take this medication during pregnancy  and for 4 months after the last dose. You will need a negative pregnancy test before starting this medication. Contraception is recommended while taking this medication and for 4 months after the last dose. Your care team can help you find the option that works for you. Do not breastfeed while taking this medication and for 4 months after the last dose. What side effects may I notice from receiving this medication? Side effects that you should report to your care team as soon as possible: Allergic reactions--skin rash, itching, hives, swelling of the face, lips, tongue, or throat Dry cough, shortness of breath or trouble breathing Eye pain, redness, irritation, or discharge with blurry or decreased vision Heart muscle inflammation--unusual weakness or fatigue, shortness of breath, chest pain, fast or irregular heartbeat, dizziness, swelling of the ankles, feet, or hands Hormone gland problems--headache, sensitivity to light, unusual weakness or fatigue, dizziness, fast or irregular heartbeat, increased sensitivity to cold or heat, excessive sweating, constipation, hair loss, increased thirst or amount of urine, tremors or shaking, irritability Infusion reactions--chest pain, shortness of breath or trouble breathing, feeling faint or lightheaded Kidney injury (glomerulonephritis)--decrease in the amount of urine, red or dark brown urine, foamy or bubbly urine, swelling of the ankles, hands, or feet Liver injury--right upper belly  pain, loss of appetite, nausea, light-colored stool, dark yellow or brown urine, yellowing skin or eyes, unusual weakness or fatigue Pain, tingling, or numbness in the hands or feet, muscle weakness, change in vision, confusion or trouble speaking, loss of balance or coordination, trouble walking, seizures Rash, fever, and swollen lymph nodes Redness, blistering, peeling, or loosening of the skin, including inside the mouth Sudden or severe stomach pain, bloody diarrhea,  fever, nausea, vomiting Side effects that usually do not require medical attention (report to your care team if they continue or are bothersome): Bone, joint, or muscle pain Diarrhea Fatigue Loss of appetite Nausea Skin rash This list may not describe all possible side effects. Call your doctor for medical advice about side effects. You may report side effects to FDA at 1-800-FDA-1088. Where should I keep my medication? This medication is given in a hospital or clinic. It will not be stored at home. NOTE: This sheet is a summary. It may not cover all possible information. If you have questions about this medicine, talk to your doctor, pharmacist, or health care provider.  2024 Elsevier/Gold Standard (2021-12-19 00:00:00)   Paclitaxel  Injection What is this medication? PACLITAXEL  (PAK li TAX el) treats some types of cancer. It works by slowing down the growth of cancer cells. This medicine may be used for other purposes; ask your health care provider or pharmacist if you have questions. COMMON BRAND NAME(S): Onxol, Taxol  What should I tell my care team before I take this medication? They need to know if you have any of these conditions: Heart disease Liver disease Low white blood cell levels An unusual or allergic reaction to paclitaxel , other medications, foods, dyes, or preservatives If you or your partner are pregnant or trying to get pregnant Breast-feeding How should I use this medication? This medication is injected into a vein. It is given by your care team in a hospital or clinic setting. Talk to your care team about the use of this medication in children. While it may be given to children for selected conditions, precautions do apply. Overdosage: If you think you have taken too much of this medicine contact a poison control center or emergency room at once. NOTE: This medicine is only for you. Do not share this medicine with others. What if I miss a dose? Keep appointments  for follow-up doses. It is important not to miss your dose. Call your care team if you are unable to keep an appointment. What may interact with this medication? Do not take this medication with any of the following: Live virus vaccines Other medications may affect the way this medication works. Talk with your care team about all of the medications you take. They may suggest changes to your treatment plan to lower the risk of side effects and to make sure your medications work as intended. This list may not describe all possible interactions. Give your health care provider a list of all the medicines, herbs, non-prescription drugs, or dietary supplements you use. Also tell them if you smoke, drink alcohol, or use illegal drugs. Some items may interact with your medicine. What should I watch for while using this medication? Your condition will be monitored carefully while you are receiving this medication. You may need blood work while taking this medication. This medication may make you feel generally unwell. This is not uncommon as chemotherapy can affect healthy cells as well as cancer cells. Report any side effects. Continue your course of treatment even though you feel ill unless  your care team tells you to stop. This medication can cause serious allergic reactions. To reduce the risk, your care team may give you other medications to take before receiving this one. Be sure to follow the directions from your care team. This medication may increase your risk of getting an infection. Call your care team for advice if you get a fever, chills, sore throat, or other symptoms of a cold or flu. Do not treat yourself. Try to avoid being around people who are sick. This medication may increase your risk to bruise or bleed. Call your care team if you notice any unusual bleeding. Be careful brushing or flossing your teeth or using a toothpick because you may get an infection or bleed more easily. If you have any  dental work done, tell your dentist you are receiving this medication. Talk to your care team if you may be pregnant. Serious birth defects can occur if you take this medication during pregnancy. Talk to your care team before breastfeeding. Changes to your treatment plan may be needed. What side effects may I notice from receiving this medication? Side effects that you should report to your care team as soon as possible: Allergic reactions--skin rash, itching, hives, swelling of the face, lips, tongue, or throat Heart rhythm changes--fast or irregular heartbeat, dizziness, feeling faint or lightheaded, chest pain, trouble breathing Increase in blood pressure Infection--fever, chills, cough, sore throat, wounds that don't heal, pain or trouble when passing urine, general feeling of discomfort or being unwell Low blood pressure--dizziness, feeling faint or lightheaded, blurry vision Low red blood cell level--unusual weakness or fatigue, dizziness, headache, trouble breathing Painful swelling, warmth, or redness of the skin, blisters or sores at the infusion site Pain, tingling, or numbness in the hands or feet Slow heartbeat--dizziness, feeling faint or lightheaded, confusion, trouble breathing, unusual weakness or fatigue Unusual bruising or bleeding Side effects that usually do not require medical attention (report to your care team if they continue or are bothersome): Diarrhea Hair loss Joint pain Loss of appetite Muscle pain Nausea Vomiting This list may not describe all possible side effects. Call your doctor for medical advice about side effects. You may report side effects to FDA at 1-800-FDA-1088. Where should I keep my medication? This medication is given in a hospital or clinic. It will not be stored at home. NOTE: This sheet is a summary. It may not cover all possible information. If you have questions about this medicine, talk to your doctor, pharmacist, or health care  provider.  2024 Elsevier/Gold Standard (2021-12-26 00:00:00)  Carboplatin  Injection What is this medication? CARBOPLATIN  (KAR boe pla tin) treats some types of cancer. It works by slowing down the growth of cancer cells. This medicine may be used for other purposes; ask your health care provider or pharmacist if you have questions. COMMON BRAND NAME(S): Paraplatin  What should I tell my care team before I take this medication? They need to know if you have any of these conditions: Blood disorders Hearing problems Kidney disease Recent or ongoing radiation therapy An unusual or allergic reaction to carboplatin , cisplatin, other medications, foods, dyes, or preservatives Pregnant or trying to get pregnant Breast-feeding How should I use this medication? This medication is injected into a vein. It is given by your care team in a hospital or clinic setting. Talk to your care team about the use of this medication in children. Special care may be needed. Overdosage: If you think you have taken too much of this medicine contact  a poison control center or emergency room at once. NOTE: This medicine is only for you. Do not share this medicine with others. What if I miss a dose? Keep appointments for follow-up doses. It is important not to miss your dose. Call your care team if you are unable to keep an appointment. What may interact with this medication? Medications for seizures Some antibiotics, such as amikacin, gentamicin, neomycin, streptomycin, tobramycin Vaccines This list may not describe all possible interactions. Give your health care provider a list of all the medicines, herbs, non-prescription drugs, or dietary supplements you use. Also tell them if you smoke, drink alcohol, or use illegal drugs. Some items may interact with your medicine. What should I watch for while using this medication? Your condition will be monitored carefully while you are receiving this medication. You may  need blood work while taking this medication. This medication may make you feel generally unwell. This is not uncommon, as chemotherapy can affect healthy cells as well as cancer cells. Report any side effects. Continue your course of treatment even though you feel ill unless your care team tells you to stop. In some cases, you may be given additional medications to help with side effects. Follow all directions for their use. This medication may increase your risk of getting an infection. Call your care team for advice if you get a fever, chills, sore throat, or other symptoms of a cold or flu. Do not treat yourself. Try to avoid being around people who are sick. Avoid taking medications that contain aspirin , acetaminophen , ibuprofen, naproxen, or ketoprofen unless instructed by your care team. These medications may hide a fever. Be careful brushing or flossing your teeth or using a toothpick because you may get an infection or bleed more easily. If you have any dental work done, tell your dentist you are receiving this medication. Talk to your care team if you wish to become pregnant or think you might be pregnant. This medication can cause serious birth defects. Talk to your care team about effective forms of contraception. Do not breast-feed while taking this medication. What side effects may I notice from receiving this medication? Side effects that you should report to your care team as soon as possible: Allergic reactions--skin rash, itching, hives, swelling of the face, lips, tongue, or throat Infection--fever, chills, cough, sore throat, wounds that don't heal, pain or trouble when passing urine, general feeling of discomfort or being unwell Low red blood cell level--unusual weakness or fatigue, dizziness, headache, trouble breathing Pain, tingling, or numbness in the hands or feet, muscle weakness, change in vision, confusion or trouble speaking, loss of balance or coordination, trouble  walking, seizures Unusual bruising or bleeding Side effects that usually do not require medical attention (report to your care team if they continue or are bothersome): Hair loss Nausea Unusual weakness or fatigue Vomiting This list may not describe all possible side effects. Call your doctor for medical advice about side effects. You may report side effects to FDA at 1-800-FDA-1088. Where should I keep my medication? This medication is given in a hospital or clinic. It will not be stored at home. NOTE: This sheet is a summary. It may not cover all possible information. If you have questions about this medicine, talk to your doctor, pharmacist, or health care provider.  2024 Elsevier/Gold Standard (2021-11-28 00:00:00)

## 2024-02-04 NOTE — Progress Notes (Signed)
 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  Patient receiving carboplatin , paclitaxel  and keytruda .    Met with patient during infusion.  Patient eating well and not having any nutrition impact symptoms from treatment.      Medications: reviewed  Labs: reviewed  Anthropometrics:   Weight 147 lb today  151 lb on 5/27  UBW of 145-151 lb   NUTRITION DIAGNOSIS: none at this time    INTERVENTION:  Continue with well balanced diet including foods rich in protein RD available as needed     NEXT VISIT: as needed  Ophie Burrowes B. Zollie Hipp, CSO, LDN Registered Dietitian [phone]

## 2024-02-04 NOTE — Addendum Note (Signed)
 Addended by: [name] on: 02/04/2024 12:30 PM   Modules accepted: Orders

## 2024-02-04 NOTE — Progress Notes (Signed)
 Hematology/Oncology Progress note [phone] [phone]     CHIEF COMPLAINTS/PURPOSE OF CONSULTATION:  Metastatic squamous cell carcinoma  ASSESSMENT & PLAN:   Cancer Staging  Squamous cell carcinoma lung, left (HCC) Staging form: Lung, AJCC V9 - Clinical stage from 12/17/2023: Tony Mitchell, pM1c - Signed by [name], MD on 12/17/2023   Squamous cell carcinoma lung, left [facility]) Imaging results and pathology results were reviewed and discussed with patient. 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 NGS showed TMB 53.2 high, PD-L1 <1%, SMARCB1- high TMB, Keytruda  was added on cycle 2 Labs are reviewed and discussed with patient. He tolerates well.  Proceed with cycle 3 Carboplatin  + Taxol  + keytruda   with D3 GCSF Repeat CT to evaluate treatment response.     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  He was see by neurology Dr. Mark Sil.   Bilateral lower extremity edema Negative DVT on bilateral LE US   Edema has improved, monitor  Elevated alkaline phosphatase level Likely due to liver metastasis.   Improving.    Orders Placed This Encounter  Procedures   CT CHEST ABDOMEN PELVIS W CONTRAST    Standing Status:   Future    Expected Date:   02/18/2024    Expiration Date:   02/03/2025    If indicated for the ordered procedure, I authorize the administration of contrast media per Radiology protocol:   Yes    Does the patient have a contrast media/X-ray dye allergy?:   No    Preferred imaging location?:   [facility]    If indicated for the ordered procedure, I authorize the administration of oral contrast media per Radiology protocol:   Yes   Follow up  3 weeks  All questions were answered. The patient knows to call the clinic with any problems, questions or concerns.  [name], MD, PhD [facility] Hematology  Oncology 02/04/2024    HISTORY OF PRESENTING ILLNESS:  Tony Mitchell 83 y.o. male presents for follow up of stage IV lung SCC  Oncology History  Squamous cell carcinoma lung, left (HCC)  11/22/2023 Imaging   CT abdomen pelvis w contrast  1. Multiple hepatic metastatic lesions. Correlation with history of known malignancy recommended. 2. Serosal implant along the distal stomach versus gastrohepatic adenopathy. 3. Thickened appearance of the gastric antrum and pylorus likely related to underdistention. An infiltrative mass is less likely but not excluded. Endoscopy may provide better evaluation. 4. Herniation of a segment of the sigmoid colon into the left inguinal canal. No bowel obstruction.     12/04/2023 Imaging   PET scan showed  Extensive hypermetabolic neoplastic disease including left lower lobe infiltrative mass extending from the pleura to the hilum with numerous abnormal left-sided hilar and mediastinal nodes.   Additional upper retroperitoneal nodes as well as extensive liver metastases.   Multifocal osseous metastatic disease including the thoracic and lumbar spine, pelvis, right humeral head and left scapula.   Based on the overall appearance and with the patient's laboratory a abnormality this very well could be a primary left lower lobe lung lesion such as small cell versus a neuroendocrine tumor with diffuse metastatic disease.   12/10/2023 Imaging   MRI brain w wo contrast  1. 10 x 9 x 10 mm rounded lesion in the posterior left frontal lobe with surrounding vasogenic edema is consistent with metastatic disease. 2. No other pathologic  enhancement is present. 3. Periventricular and scattered subcortical T2 hyperintensities bilaterally are mildly advanced for age. The finding is nonspecific but can be seen in the setting of chronic microvascular ischemia, a demyelinating process such as multiple sclerosis, vasculitis, complicated migraine headaches, or  as the sequelae of a prior infectious or inflammatory process.   12/17/2023 Initial Diagnosis   Squamous cell carcinoma lung, left (HCC)  He began experiencing abdominal pain for a week.  Initially presenting as increased gas and stomach growling despite having eaten. The pain was localized between the rib cage and the abdomen and described as intermittent.  Later the pain had resolved, but he continues to feel bloated. He has CT abdomen pelvis done which showed liver masses.   Liver biopsy showed  1. Liver, biopsy, right hepatic lesion :      - METASTATIC POORLY DIFFERENTIATED SQUAMOUS CELL CARCINOMA WITH NECROSIS.  Diagnosis Note : Per [facility] a recent PET scan revealed a hypermetabolic left lower lobe lung mass with extensive metastatic lesions. The carcinoma is positive for cytokeratin 7, p40 (greater than 50%), and CD56 (subset). Cytokeratin 20, TTF-1,  chromogranin, synaptophysin, and CDX-2 are negative. In the absence of  chromogranin / synaptophysin staining the presence of patchy CD56 staining is interpreted as non-specific. The pattern of immunoreactivity is consistent with metastatic poorly differentiated squamous cell carcinoma.     12/17/2023 Cancer Staging   Staging form: Lung, AJCC V9 - Clinical stage from 12/17/2023: Tony Mitchell, pM1c - Signed by [name], MD on 12/17/2023 Histopathologic type: Squamous cell carcinoma, NOS Stage prefix: Initial diagnosis Laterality: Left Sites of metastasis: Bone, Liver, Brain   12/23/2023 - 12/25/2023 Chemotherapy   Patient is on Treatment Plan : LUNG Carboplatin  + Paclitaxel  q21d     01/14/2024 -  Chemotherapy   Patient is on Treatment Plan : LUNG NSCLC Carboplatin  (6) + Paclitaxel  (200) + Pembrolizumab  (200) D1 q21d x 4 cycles / Pembrolizumab  (200) Maintenance D1 q21d       Patient denies changes in bowel habits.  Denies unintentional weight loss night sweats or fever. No history of blood transfusions or hepatitis. He is not on any blood thinners  but takes aspirin  for preventive purposes.  He has a history of stage 3 renal disease diagnosed three years ago after a blood test revealed elevated potassium levels. Since then, he has made significant lifestyle changes, including stopping alcohol consumption and losing weight. He now maintains a weight between 145 and 151 pounds and walks approximately 5.6 miles daily.  He has a history of skin cancer, treated with resection, and is scheduled for further treatment. His father had prostate cancer.  Patient had a normal PSA in January 2025.   Patient lives at home with wife who has multiple medical problems.  He is the caregiver for her. His daughter passed away last year.  He has granddaughter who lives in [place] .  He denies SOB, chest pain, hemoptysis.  Former smoker.  Abdominal pain has improved and he did not need to take any pain meds.   INTERVAL HISTORY Tony Mitchell is a 83 y.o. male who has above history reviewed by me today presents for follow up visit for treatment of metastatic squamous cell lung cancer.  He reports feeling ok today. Appetite is good. No fever or chills.  S/p brain radiation he finished tapering course of steroid.  +leg edema has improved.  + intermittent pin and needling sensation on his fingertips.     MEDICAL HISTORY:  Past Medical History:  Diagnosis Date   Acute appendicitis 03/03/2018   Arthritis    Cancer (HCC)    skin cancer   Chronic kidney disease, stage 3b (HCC)    Collapsed lung 2019   x2   COPD (chronic obstructive pulmonary disease) (HCC)    Coronary artery disease    Diabetes mellitus without complication (HCC)    diet controlled-lost 60 lbs and is not on any current meds   GERD (gastroesophageal reflux disease)    Gout    Hyperlipidemia    Hypertension    Sleep apnea    uses cpap   Thoracic aortic aneurysm (HCC)    Vitamin B 12 deficiency     SURGICAL HISTORY: Past Surgical History:  Procedure Laterality Date   CARDIAC  CATHETERIZATION     CATARACT EXTRACTION  2012   CHEST TUBE INSERTION     x2   COLONOSCOPY WITH PROPOFOL  N/A 04/14/2021   Procedure: COLONOSCOPY WITH PROPOFOL ;  Surgeon: Shane Darling, MD;  Location: [facility] ENDOSCOPY;  Service: Endoscopy;  Laterality: N/A;   ESOPHAGOGASTRODUODENOSCOPY (EGD) WITH PROPOFOL  N/A 04/14/2021   Procedure: ESOPHAGOGASTRODUODENOSCOPY (EGD) WITH PROPOFOL ;  Surgeon: Shane Darling, MD;  Location: [facility] ENDOSCOPY;  Service: Endoscopy;  Laterality: N/A;   INSERTION OF MESH  09/22/2021   Procedure: INSERTION OF MESH;  Surgeon: Conrado Delay, DO;  Location: [facility] ORS;  Service: General;;   JOINT REPLACEMENT     KNEE SURGERY Left 1965   LAPAROSCOPIC APPENDECTOMY N/A 03/03/2018   Procedure: APPENDECTOMY LAPAROSCOPIC;  Surgeon: Franki Isles, MD;  Location: [facility] ORS;  Service: General;  Laterality: N/A;   TOTAL KNEE ARTHROPLASTY Left 09/16/2018   Procedure: TOTAL KNEE ARTHROPLASTY;  Surgeon: Elner Hahn, MD;  Location: [facility] ORS;  Service: Orthopedics;  Laterality: Left;    SOCIAL HISTORY: Social History   Socioeconomic History   Marital status: Married    Spouse name: Not on file   Number of children: Not on file   Years of education: Not on file   Highest education level: Not on file  Occupational History   Not on file  Tobacco Use   Smoking status: Former    Current packs/day: 0.00    Average packs/day: 1 pack/day for 40.0 years (40.0 ttl pk-yrs)    Types: Cigarettes    Start date: 06/15/1956    Quit date: 06/15/1996    Years since quitting: 27.6    Passive exposure: Past   Smokeless tobacco: Never  Vaping Use   Vaping status: Never Used  Substance and Sexual Activity   Alcohol use: Not Currently    Alcohol/week: 21.0 standard drinks of alcohol    Types: 21 Shots of liquor per week    Comment: reports last drink 11/03/20   Drug use: No   Sexual activity: Yes  Other Topics Concern   Not on file  Social History Narrative   Not on file    Social Drivers of Health   Financial Resource Strain: Low Risk  (10/30/2023)   Received [facility]   Overall Financial Resource Strain (CARDIA)    Difficulty of Paying Living Expenses: Not hard at all  Food Insecurity: No Food Insecurity (10/30/2023)   Received from [facility]   Hunger Vital Sign    Within the past 12 months, you worried that your food would run out before you got the money to buy more.: Never true    Within the past 12 months, the food you bought just didn't last  and you didn't have money to get more.: Never true  Transportation Needs: No Transportation Needs (10/30/2023)   Received from [facility] - Transportation    In the past 12 months, has lack of transportation kept you from medical appointments or from getting medications?: No    Lack of Transportation (Non-Medical): No  Physical Activity: Sufficiently Active (07/10/2017)   Received from [facility]   Exercise Vital Sign    Days of Exercise per Week: 5 days    Minutes of Exercise per Session: 60 min  Stress: Stress Concern Present (07/10/2017)   Received from [facility] - Occupational Stress Questionnaire    Feeling of Stress : Rather much  Social Connections: Unknown (07/10/2017)   Received from [facility]   Social Connection and Isolation Panel    Frequency of Communication with Friends and Family: Patient declined    Frequency of Social Gatherings with Friends and Family: Patient declined    Attends Religious Services: Patient declined    Database administrator or Organizations: Patient declined    Attends Engineer, structural: Patient declined    Marital Status: Patient declined  Catering manager Violence: Not on file    FAMILY HISTORY: Family History  Problem Relation Age of Onset   Cancer Father        Unknown    Prostate cancer Father    Lung cancer Neg Hx     ALLERGIES:  has no known allergies.  MEDICATIONS:  Current Outpatient Medications  Medication Sig Dispense Refill   allopurinol  (ZYLOPRIM ) 100 MG tablet Take 100 mg by mouth in the morning and at bedtime.     ALPRAZolam  (XANAX ) 0.5 MG tablet Take 0.5 mg by mouth at bedtime.     amLODipine  (NORVASC ) 2.5 MG tablet Take 2.5 mg by mouth in the morning.     finasteride  (PROSCAR ) 5 MG tablet Take 1 tablet (5 mg total) by mouth daily. 90 tablet 3   furosemide  (LASIX ) 20 MG tablet Take 1 tablet (20 mg total) by mouth daily as needed. 30 tablet 0   hydrALAZINE  (APRESOLINE ) 25 MG tablet Take 25 mg by mouth 2 (two) times daily.     lansoprazole (PREVACID) 15 MG capsule Take 15 mg by mouth every evening.     simvastatin  (ZOCOR ) 20 MG tablet Take 1 tablet by mouth 2 (two) times a week. On  Wednesday and Sunday     tamsulosin  (FLOMAX ) 0.4 MG CAPS capsule Take 1 capsule (0.4 mg total) by mouth daily. 30 capsule 11   vitamin B-12 (CYANOCOBALAMIN ) 1000 MCG tablet Take 1,000 mcg by mouth every morning.     Vitamin D, Ergocalciferol, (DRISDOL) 1.25 MG (50000 UNIT) CAPS capsule Take 50,000 Units by mouth every 7 (seven) days.     cephALEXin  (KEFLEX ) 500 MG capsule Take 1 capsule (500 mg total) by mouth 2 (two) times daily. (Patient not taking: Reported on 02/04/2024) 14 capsule 0   colchicine 0.6 MG tablet Take 2 tablets (1.2mg ) by mouth at first sign of gout flare followed by 1 tablet (0.6mg ) after 1 hour. (Max 1.8mg  within 1 hour) (Patient not taking: Reported on 02/04/2024)     fenofibrate  (TRICOR ) 145 MG tablet Take 145 mg by mouth 2 (two) times a week. On Wednesday and Sunday (Patient not taking: Reported on 02/04/2024)     oxyCODONE  (OXY IR/ROXICODONE ) 5 MG immediate release tablet Take 1 tablet (  5 mg total) by mouth every 6 (six) hours as needed for severe pain (pain score 7-10) or breakthrough pain. (Patient not taking: Reported on 02/04/2024) 10 tablet 0    prochlorperazine  (COMPAZINE ) 10 MG tablet Take 1 tablet (10 mg total) by mouth every 6 (six) hours as needed for nausea or vomiting. (Patient not taking: Reported on 02/04/2024) 30 tablet 1   triamcinolone  ointment (KENALOG ) 0.5 % Apply 1 Application topically 2 (two) times daily. (Patient not taking: Reported on 02/04/2024) 30 g 0   No current facility-administered medications for this visit.   Facility-Administered Medications Ordered in Other Visits  Medication Dose Route Frequency Provider Last Rate Last Admin   0.9 %  sodium chloride  infusion   Intravenous Continuous [name], MD 10 mL/hr at 02/04/24 0921 New Bag at 02/04/24 0921   CARBOplatin  (PARAPLATIN ) 380 mg in sodium chloride  0.9 % 100 mL chemo infusion  380 mg Intravenous Once [name], MD       heparin  lock flush 100 unit/mL  500 Units Intracatheter Once PRN [name], MD       PACLitaxel  (TAXOL ) 264 mg in sodium chloride  0.9 % 250 mL chemo infusion (> 80mg /m2)  150 mg/m2 (Treatment Plan Recorded) Intravenous Once [name], MD 98 mL/hr at 02/04/24 1051 264 mg at 02/04/24 1051    Review of Systems  Constitutional:  Negative for appetite change, chills, fatigue, fever and unexpected weight change.  HENT:   Negative for hearing loss and voice change.   Eyes:  Negative for eye problems and icterus.  Respiratory:  Negative for chest tightness, cough and shortness of breath.   Cardiovascular:  Negative for chest pain and leg swelling.  Gastrointestinal:  Negative for abdominal distention and abdominal pain.       Bloating  Endocrine: Negative for hot flashes.  Genitourinary:  Negative for difficulty urinating, dysuria and frequency.   Musculoskeletal:  Negative for arthralgias.  Skin:  Negative for itching and rash.  Neurological:  Positive for numbness. Negative for light-headedness.  Hematological:  Negative for adenopathy. Does not bruise/bleed easily.  Psychiatric/Behavioral:  Negative for confusion.      PHYSICAL  EXAMINATION: ECOG PERFORMANCE STATUS: 1 - Symptomatic but completely ambulatory  Vitals:   02/04/24 0837  BP: (!) 143/68  Pulse: 81  Resp: 18  Temp: 97.8 F (36.6 C)  SpO2: 100%   Filed Weights   02/04/24 0837  Weight: 147 lb 1.6 oz (66.7 kg)    Physical Exam Constitutional:      General: He is not in acute distress.    Appearance: He is not diaphoretic.  HENT:     Head: Normocephalic and atraumatic.     Nose: Nose normal.   Eyes:     General: No scleral icterus.   Cardiovascular:     Rate and Rhythm: Normal rate and regular rhythm.     Heart sounds: No murmur heard. Pulmonary:     Effort: Pulmonary effort is normal. No respiratory distress.  Abdominal:     General: There is no distension.     Palpations: Abdomen is soft. There is no mass.     Tenderness: There is no abdominal tenderness.   Musculoskeletal:        General: Normal range of motion.     Cervical back: Normal range of motion and neck supple.   Skin:    General: Skin is warm and dry.     Findings: No erythema.   Neurological:     Mental Status:  He is alert and oriented to person, place, and time. Mental status is at baseline.     Cranial Nerves: No cranial nerve deficit.     Motor: No abnormal muscle tone.   Psychiatric:        Mood and Affect: Mood and affect normal.      LABORATORY DATA:  I have reviewed the data as listed    Latest Ref Rng & Units 02/04/2024    8:15 AM 01/28/2024   10:50 AM 01/22/2024   11:04 AM  CBC  WBC 4.0 - 10.5 K/uL 11.5  17.6  27.2   Hemoglobin 13.0 - 17.0 g/dL 56.2  13.0  86.5   Hematocrit 39.0 - 52.0 % 38.4  37.6  36.5   Platelets 150 - 400 K/uL 328  234  252       Latest Ref Rng & Units 02/04/2024    8:15 AM 01/22/2024   11:03 AM 01/14/2024    8:11 AM  CMP  Glucose 70 - 99 mg/dL 99  784  696   BUN 8 - 23 mg/dL 23  21  24    Creatinine 0.61 - 1.24 mg/dL 2.95  2.84  1.32   Sodium 135 - 145 mmol/L 133  131  132   Potassium 3.5 - 5.1 mmol/L 4.7  4.5  4.2    Chloride 98 - 111 mmol/L 98  95  99   CO2 22 - 32 mmol/L 26  26  26    Calcium 8.9 - 10.3 mg/dL 9.0  8.7  8.6   Total Protein 6.5 - 8.1 g/dL 6.2   5.9   Total Bilirubin 0.0 - 1.2 mg/dL 1.0   0.7   Alkaline Phos 38 - 126 U/L 156   231   AST 15 - 41 U/L 25   60   ALT 0 - 44 U/L 26   34      RADIOGRAPHIC STUDIES: I have personally reviewed the radiological images as listed and agreed with the findings in the report. US  Venous Img Lower Bilateral Result Date: 01/15/2024 CLINICAL DATA:  Bilateral lower extremity edema. EXAM: BILATERAL LOWER EXTREMITY VENOUS DOPPLER ULTRASOUND TECHNIQUE: Gray-scale sonography with graded compression, as well as color Doppler and duplex ultrasound were performed to evaluate the lower extremity deep venous systems from the level of the common femoral vein and including the common femoral, femoral, profunda femoral, popliteal and calf veins including the posterior tibial, peroneal and gastrocnemius veins when visible. The superficial great saphenous vein was also interrogated. Spectral Doppler was utilized to evaluate flow at rest and with distal augmentation maneuvers in the common femoral, femoral and popliteal veins. COMPARISON:  None Available. FINDINGS: RIGHT LOWER EXTREMITY Common Femoral Vein: No evidence of thrombus. Normal compressibility, respiratory phasicity and response to augmentation. Saphenofemoral Junction: No evidence of thrombus. Normal compressibility and flow on color Doppler imaging. Profunda Femoral Vein: No evidence of thrombus. Normal compressibility and flow on color Doppler imaging. Femoral Vein: No evidence of thrombus. Normal compressibility, respiratory phasicity and response to augmentation. Popliteal Vein: No evidence of thrombus. Normal compressibility, respiratory phasicity and response to augmentation. Calf Veins: No evidence of thrombus. Normal compressibility and flow on color Doppler imaging. Other Findings:  None. LEFT LOWER EXTREMITY  Common Femoral Vein: No evidence of thrombus. Normal compressibility, respiratory phasicity and response to augmentation. Saphenofemoral Junction: No evidence of thrombus. Normal compressibility and flow on color Doppler imaging. Profunda Femoral Vein: No evidence of thrombus. Normal compressibility and flow on color Doppler imaging. Femoral  Vein: No evidence of thrombus. Normal compressibility, respiratory phasicity and response to augmentation. Popliteal Vein: No evidence of thrombus. Normal compressibility, respiratory phasicity and response to augmentation. Calf Veins: No evidence of thrombus. Normal compressibility and flow on color Doppler imaging. Other Findings:  None. IMPRESSION: No evidence of deep venous thrombosis in either lower extremity. Electronically Signed   By: Donnal Fusi M.D.   On: 01/15/2024 09:13

## 2024-02-06 ENCOUNTER — Inpatient Hospital Stay

## 2024-02-06 DIAGNOSIS — Z5111 Encounter for antineoplastic chemotherapy: Secondary | ICD-10-CM | POA: Diagnosis not present

## 2024-02-06 DIAGNOSIS — C3492 Malignant neoplasm of unspecified part of left bronchus or lung: Secondary | ICD-10-CM

## 2024-02-06 MED ORDER — PEGFILGRASTIM-FPGK 6 MG/0.6ML ~~LOC~~ SOSY
6.0000 mg | PREFILLED_SYRINGE | Freq: Once | SUBCUTANEOUS | Status: AC
  Administered 2024-02-06: 6 mg via SUBCUTANEOUS
  Filled 2024-02-06: qty 0.6

## 2024-02-13 ENCOUNTER — Telehealth: Payer: Self-pay | Admitting: *Deleted

## 2024-02-13 NOTE — Progress Notes (Signed)
 After multiple attempts, we have been unable to reach this patient to enroll in services.  Last attempt made on 02/11/2024.  [facility] remains available should the patient express interest in the future.

## 2024-02-13 NOTE — Telephone Encounter (Signed)
 Pt called in to report that has noticed some increased numbness in both of his feet since starting treatment. Pt states that it is tolerable at this time and will further discuss at his next appt but wanted to make Dr. Babara aware.

## 2024-02-18 ENCOUNTER — Ambulatory Visit
Admission: RE | Admit: 2024-02-18 | Discharge: 2024-02-18 | Disposition: A | Discharge status: Home / Self Care | Payer: Self-pay | Source: Ambulatory Visit | Attending: Oncology | Admitting: Oncology

## 2024-02-18 DIAGNOSIS — C3492 Malignant neoplasm of unspecified part of left bronchus or lung: Secondary | ICD-10-CM | POA: Diagnosis present

## 2024-02-18 MED ORDER — IOHEXOL 300 MG/ML  SOLN
100.0000 mL | Freq: Once | INTRAMUSCULAR | Status: AC | PRN
  Administered 2024-02-18: 100 mL via INTRAVENOUS

## 2024-02-25 ENCOUNTER — Inpatient Hospital Stay (HOSPITAL_BASED_OUTPATIENT_CLINIC_OR_DEPARTMENT_OTHER): Admitting: Oncology

## 2024-02-25 ENCOUNTER — Inpatient Hospital Stay: Attending: Oncology

## 2024-02-25 ENCOUNTER — Encounter: Payer: Self-pay | Admitting: Oncology

## 2024-02-25 ENCOUNTER — Inpatient Hospital Stay

## 2024-02-25 VITALS — BP 138/72 | HR 69 | Temp 97.2°F | Resp 16 | Wt 155.0 lb

## 2024-02-25 DIAGNOSIS — N400 Enlarged prostate without lower urinary tract symptoms: Secondary | ICD-10-CM | POA: Insufficient documentation

## 2024-02-25 DIAGNOSIS — G4730 Sleep apnea, unspecified: Secondary | ICD-10-CM | POA: Insufficient documentation

## 2024-02-25 DIAGNOSIS — G629 Polyneuropathy, unspecified: Secondary | ICD-10-CM | POA: Diagnosis not present

## 2024-02-25 DIAGNOSIS — Z8042 Family history of malignant neoplasm of prostate: Secondary | ICD-10-CM | POA: Insufficient documentation

## 2024-02-25 DIAGNOSIS — C787 Secondary malignant neoplasm of liver and intrahepatic bile duct: Secondary | ICD-10-CM | POA: Diagnosis not present

## 2024-02-25 DIAGNOSIS — M533 Sacrococcygeal disorders, not elsewhere classified: Secondary | ICD-10-CM | POA: Insufficient documentation

## 2024-02-25 DIAGNOSIS — G620 Drug-induced polyneuropathy: Secondary | ICD-10-CM | POA: Diagnosis not present

## 2024-02-25 DIAGNOSIS — Z5111 Encounter for antineoplastic chemotherapy: Secondary | ICD-10-CM | POA: Insufficient documentation

## 2024-02-25 DIAGNOSIS — T451X5A Adverse effect of antineoplastic and immunosuppressive drugs, initial encounter: Secondary | ICD-10-CM | POA: Insufficient documentation

## 2024-02-25 DIAGNOSIS — I700 Atherosclerosis of aorta: Secondary | ICD-10-CM | POA: Insufficient documentation

## 2024-02-25 DIAGNOSIS — C7951 Secondary malignant neoplasm of bone: Secondary | ICD-10-CM | POA: Insufficient documentation

## 2024-02-25 DIAGNOSIS — Z79899 Other long term (current) drug therapy: Secondary | ICD-10-CM | POA: Insufficient documentation

## 2024-02-25 DIAGNOSIS — E042 Nontoxic multinodular goiter: Secondary | ICD-10-CM | POA: Diagnosis not present

## 2024-02-25 DIAGNOSIS — E785 Hyperlipidemia, unspecified: Secondary | ICD-10-CM | POA: Insufficient documentation

## 2024-02-25 DIAGNOSIS — I2510 Atherosclerotic heart disease of native coronary artery without angina pectoris: Secondary | ICD-10-CM | POA: Diagnosis not present

## 2024-02-25 DIAGNOSIS — C3492 Malignant neoplasm of unspecified part of left bronchus or lung: Secondary | ICD-10-CM

## 2024-02-25 DIAGNOSIS — C7931 Secondary malignant neoplasm of brain: Secondary | ICD-10-CM

## 2024-02-25 DIAGNOSIS — E1140 Type 2 diabetes mellitus with diabetic neuropathy, unspecified: Secondary | ICD-10-CM | POA: Diagnosis not present

## 2024-02-25 DIAGNOSIS — Z5112 Encounter for antineoplastic immunotherapy: Secondary | ICD-10-CM | POA: Diagnosis not present

## 2024-02-25 DIAGNOSIS — I129 Hypertensive chronic kidney disease with stage 1 through stage 4 chronic kidney disease, or unspecified chronic kidney disease: Secondary | ICD-10-CM | POA: Diagnosis not present

## 2024-02-25 DIAGNOSIS — I712 Thoracic aortic aneurysm, without rupture, unspecified: Secondary | ICD-10-CM | POA: Insufficient documentation

## 2024-02-25 DIAGNOSIS — M129 Arthropathy, unspecified: Secondary | ICD-10-CM | POA: Diagnosis not present

## 2024-02-25 DIAGNOSIS — J432 Centrilobular emphysema: Secondary | ICD-10-CM | POA: Insufficient documentation

## 2024-02-25 DIAGNOSIS — Z85828 Personal history of other malignant neoplasm of skin: Secondary | ICD-10-CM | POA: Insufficient documentation

## 2024-02-25 DIAGNOSIS — Z809 Family history of malignant neoplasm, unspecified: Secondary | ICD-10-CM | POA: Insufficient documentation

## 2024-02-25 DIAGNOSIS — E1122 Type 2 diabetes mellitus with diabetic chronic kidney disease: Secondary | ICD-10-CM | POA: Insufficient documentation

## 2024-02-25 DIAGNOSIS — R634 Abnormal weight loss: Secondary | ICD-10-CM | POA: Diagnosis not present

## 2024-02-25 DIAGNOSIS — N1832 Chronic kidney disease, stage 3b: Secondary | ICD-10-CM | POA: Diagnosis not present

## 2024-02-25 DIAGNOSIS — K4090 Unilateral inguinal hernia, without obstruction or gangrene, not specified as recurrent: Secondary | ICD-10-CM | POA: Diagnosis not present

## 2024-02-25 DIAGNOSIS — E538 Deficiency of other specified B group vitamins: Secondary | ICD-10-CM | POA: Insufficient documentation

## 2024-02-25 DIAGNOSIS — Z87891 Personal history of nicotine dependence: Secondary | ICD-10-CM | POA: Insufficient documentation

## 2024-02-25 LAB — CBC WITH DIFFERENTIAL (CANCER CENTER ONLY)
Abs Immature Granulocytes: 0.04 K/uL (ref 0.00–0.07)
Basophils Absolute: 0.1 K/uL (ref 0.0–0.1)
Basophils Relative: 1 %
Eosinophils Absolute: 0 K/uL (ref 0.0–0.5)
Eosinophils Relative: 0 %
HCT: 37.3 % — ABNORMAL LOW (ref 39.0–52.0)
Hemoglobin: 12.6 g/dL — ABNORMAL LOW (ref 13.0–17.0)
Immature Granulocytes: 1 %
Lymphocytes Relative: 25 %
Lymphs Abs: 1.9 K/uL (ref 0.7–4.0)
MCH: 30.4 pg (ref 26.0–34.0)
MCHC: 33.8 g/dL (ref 30.0–36.0)
MCV: 90.1 fL (ref 80.0–100.0)
Monocytes Absolute: 0.8 K/uL (ref 0.1–1.0)
Monocytes Relative: 10 %
Neutro Abs: 4.8 K/uL (ref 1.7–7.7)
Neutrophils Relative %: 63 %
Platelet Count: 253 K/uL (ref 150–400)
RBC: 4.14 MIL/uL — ABNORMAL LOW (ref 4.22–5.81)
RDW: 17.2 % — ABNORMAL HIGH (ref 11.5–15.5)
WBC Count: 7.6 K/uL (ref 4.0–10.5)
nRBC: 0 % (ref 0.0–0.2)

## 2024-02-25 LAB — TSH: TSH: 1.074 u[IU]/mL (ref 0.350–4.500)

## 2024-02-25 LAB — CMP (CANCER CENTER ONLY)
ALT: 20 U/L (ref 0–44)
AST: 28 U/L (ref 15–41)
Albumin: 4.1 g/dL (ref 3.5–5.0)
Alkaline Phosphatase: 125 U/L (ref 38–126)
Anion gap: 9 (ref 5–15)
BUN: 20 mg/dL (ref 8–23)
CO2: 26 mmol/L (ref 22–32)
Calcium: 9.1 mg/dL (ref 8.9–10.3)
Chloride: 100 mmol/L (ref 98–111)
Creatinine: 0.8 mg/dL (ref 0.61–1.24)
GFR, Estimated: >60 mL/min (ref >60)
Glucose, Bld: 111 mg/dL — ABNORMAL HIGH (ref 70–99)
Potassium: 3.6 mmol/L (ref 3.5–5.1)
Sodium: 135 mmol/L (ref 135–145)
Total Bilirubin: 0.8 mg/dL (ref 0.0–1.2)
Total Protein: 6.6 g/dL (ref 6.5–8.1)

## 2024-02-25 MED ORDER — DIPHENHYDRAMINE HCL 50 MG/ML IJ SOLN
50.0000 mg | Freq: Once | INTRAMUSCULAR | Status: AC
  Administered 2024-02-25: 50 mg via INTRAVENOUS
  Filled 2024-02-25: qty 1

## 2024-02-25 MED ORDER — DEXAMETHASONE SODIUM PHOSPHATE 10 MG/ML IJ SOLN
10.0000 mg | Freq: Once | INTRAMUSCULAR | Status: AC
  Administered 2024-02-25: 10 mg via INTRAVENOUS
  Filled 2024-02-25: qty 1

## 2024-02-25 MED ORDER — PALONOSETRON HCL INJECTION 0.25 MG/5ML
0.2500 mg | Freq: Once | INTRAVENOUS | Status: AC
  Administered 2024-02-25: 0.25 mg via INTRAVENOUS
  Filled 2024-02-25: qty 5

## 2024-02-25 MED ORDER — GABAPENTIN 100 MG PO CAPS: 100.0000 mg | ORAL_CAPSULE | Freq: Two times a day (BID) | ORAL | Status: DC

## 2024-02-25 MED ORDER — APREPITANT 130 MG/18ML IV EMUL
130.0000 mg | Freq: Once | INTRAVENOUS | Status: AC
  Administered 2024-02-25: 130 mg via INTRAVENOUS
  Filled 2024-02-25: qty 18

## 2024-02-25 MED ORDER — SODIUM CHLORIDE 0.9 % IV SOLN
200.0000 mg | Freq: Once | INTRAVENOUS | Status: AC
  Administered 2024-02-25: 200 mg via INTRAVENOUS
  Filled 2024-02-25: qty 200

## 2024-02-25 MED ORDER — SODIUM CHLORIDE 0.9 % IV SOLN
INTRAVENOUS | Status: DC
Start: 2024-02-25 — End: 2024-02-25
  Filled 2024-02-25: qty 250

## 2024-02-25 MED ORDER — FAMOTIDINE IN NACL 20-0.9 MG/50ML-% IV SOLN
20.0000 mg | Freq: Once | INTRAVENOUS | Status: AC
  Administered 2024-02-25: 20 mg via INTRAVENOUS
  Filled 2024-02-25: qty 50

## 2024-02-25 MED ORDER — SODIUM CHLORIDE 0.9 % IV SOLN
135.0000 mg/m2 | Freq: Once | INTRAVENOUS | Status: AC
  Administered 2024-02-25: 240 mg via INTRAVENOUS
  Filled 2024-02-25: qty 40

## 2024-02-25 MED ORDER — SODIUM CHLORIDE 0.9 % IV SOLN
379.5000 mg | Freq: Once | INTRAVENOUS | Status: AC
  Administered 2024-02-25: 380 mg via INTRAVENOUS
  Filled 2024-02-25: qty 38

## 2024-02-25 NOTE — Assessment & Plan Note (Signed)
Follow up with nutritionist.  

## 2024-02-25 NOTE — Progress Notes (Signed)
 Hematology/Oncology Progress note [phone] [phone]     CHIEF COMPLAINTS/PURPOSE OF CONSULTATION:  Metastatic squamous cell carcinoma  ASSESSMENT & PLAN:   Cancer Staging  Squamous cell carcinoma lung, left (HCC) Staging form: Lung, AJCC V9 - Clinical stage from 12/17/2023: Tony Mitchell, pM1c - Signed by Tony Call, MD on 12/17/2023   Squamous cell carcinoma lung, left [facility]) Imaging results and pathology results were reviewed and discussed with patient. 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 NGS showed TMB 53.2 high, PD-L1 <1%, SMARCB1- high TMB, Keytruda  was added on cycle 2 Labs are reviewed and discussed with patient. He tolerates well.  Proceed with cycle 4 Carboplatin  + Taxol  [dose reduction to 135mg /m2] + keytruda   with D3 GCSF CT to showed partial  response.     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  He was see by neurology Dr. Buckley.   Encounter for antineoplastic chemotherapy Treatment as planned.   Chemotherapy-induced neuropathy (HCC) He is already on gabapentin  100mg  daily, recommend to increase to 2-3 time daily.  Taxol  dose reduction.  Refer to neurology   Orders Placed This Encounter  Procedures   Ambulatory referral to Neurology    Referral Priority:   Routine    Referral Type:   Consultation    Referral Reason:   Specialty Services Required    Requested Specialty:   Neurology    Number of Visits Requested:   1   Follow up  3 weeks  All questions were answered. The patient knows to Mitchell the clinic with any problems, questions or concerns.  Mitchell Babara, MD, PhD [facility] Hematology Oncology 02/25/2024    HISTORY OF PRESENTING ILLNESS:  Tony Mitchell 83 y.o. male presents for follow up of stage IV lung SCC  Oncology History  Squamous cell carcinoma lung, left (HCC)  11/22/2023 Imaging    CT abdomen pelvis w contrast  1. Multiple hepatic metastatic lesions. Correlation with history of known malignancy recommended. 2. Serosal implant along the distal stomach versus gastrohepatic adenopathy. 3. Thickened appearance of the gastric antrum and pylorus likely related to underdistention. An infiltrative mass is less likely but not excluded. Endoscopy may provide better evaluation. 4. Herniation of a segment of the sigmoid colon into the left inguinal canal. No bowel obstruction.     12/04/2023 Imaging   PET scan showed  Extensive hypermetabolic neoplastic disease including left lower lobe infiltrative mass extending from the pleura to the hilum with numerous abnormal left-sided hilar and mediastinal nodes.   Additional upper retroperitoneal nodes as well as extensive liver metastases.   Multifocal osseous metastatic disease including the thoracic and lumbar spine, pelvis, right humeral head and left scapula.   Based on the overall appearance and with the patient's laboratory a abnormality this very well could be a primary left lower lobe lung lesion such as small cell versus a neuroendocrine tumor with diffuse metastatic disease.   12/10/2023 Imaging   MRI brain w wo contrast  1. 10 x 9 x 10 mm rounded lesion in the posterior left frontal lobe with surrounding vasogenic edema is consistent with metastatic disease. 2. No other pathologic enhancement is present. 3. Periventricular and scattered subcortical T2 hyperintensities bilaterally are mildly advanced for age. The finding is nonspecific but can be seen in the setting of chronic microvascular ischemia, a demyelinating process such as multiple sclerosis, vasculitis, complicated migraine headaches, or  as the sequelae of a prior infectious or inflammatory process.   12/17/2023 Initial Diagnosis   Squamous cell carcinoma lung, left (HCC)  He began experiencing abdominal pain for a week.  Initially presenting as  increased gas and stomach growling despite having eaten. The pain was localized between the rib cage and the abdomen and described as intermittent.  Later the pain had resolved, but he continues to feel bloated. He has CT abdomen pelvis done which showed liver masses.   Liver biopsy showed  1. Liver, biopsy, right hepatic lesion :      - METASTATIC POORLY DIFFERENTIATED SQUAMOUS CELL CARCINOMA WITH NECROSIS.  Diagnosis Note : Per [facility] a recent PET scan revealed a hypermetabolic left lower lobe lung mass with extensive metastatic lesions. The carcinoma is positive for cytokeratin 7, p40 (greater than 50%), and CD56 (subset). Cytokeratin 20, TTF-1,  chromogranin, synaptophysin, and CDX-2 are negative. In the absence of  chromogranin / synaptophysin staining the presence of patchy CD56 staining is interpreted as non-specific. The pattern of immunoreactivity is consistent with metastatic poorly differentiated squamous cell carcinoma.     12/17/2023 Cancer Staging   Staging form: Lung, AJCC V9 - Clinical stage from 12/17/2023: Tony Mitchell, pM1c - Signed by Tony Call, MD on 12/17/2023 Histopathologic type: Squamous cell carcinoma, NOS Stage prefix: Initial diagnosis Laterality: Left Sites of metastasis: Bone, Liver, Brain   12/23/2023 - 12/25/2023 Chemotherapy   Patient is on Treatment Plan : LUNG Carboplatin  + Paclitaxel  q21d     01/14/2024 -  Chemotherapy   Patient is on Treatment Plan : LUNG NSCLC Carboplatin  (6) + Paclitaxel  (200) + Pembrolizumab  (200) D1 q21d x 4 cycles / Pembrolizumab  (200) Maintenance D1 q21d      Imaging   CT chest abdomen pelvis w contrast  1. Nearly complete resolution of previously seen dependent left lower lobe masses and nodularity. Additional small nonspecific pulmonary nodules unchanged. 2. No persistently enlarged mediastinal or left hilar lymph nodes. 3. Significantly diminished size of numerous hypodense liver lesions. 4. Constellation of findings is consistent with  treatment response of lung malignancy and associated metastatic disease. 5. Very little CT correlate of previously seen FDG avid osseous metastatic disease; subtle lucency of the T5 vertebral body at the site of a previously FDG avid metastasis. Other lesions not clearly appreciated by CT. Nuclear scintigraphic bone scan or repeat PET-CT may be helpful to assess for residual metabolically active osseous metastatic disease. 6. Emphysema and diffuse bilateral bronchial wall thickening. 7. Coronary artery disease. 8. Large left inguinal hernia containing nonobstructed sigmoid colon.   Aortic Atherosclerosis (ICD10-I70.0) and Emphysema (ICD10-J43.9).   02/18/2024 Imaging   CT chest abdomen pelvis w contrast showed 1. Nearly complete resolution of previously seen dependent left lower lobe masses and nodularity. Additional small nonspecific pulmonary nodules unchanged. 2. No persistently enlarged mediastinal or left hilar lymph nodes. 3. Significantly diminished size of numerous hypodense liver lesions. 4. Constellation of findings is consistent with treatment response of lung malignancy and associated metastatic disease. 5. Very little CT correlate of previously seen FDG avid osseous metastatic disease; subtle lucency of the T5 vertebral body at the site of a previously FDG avid metastasis. Other lesions not clearly appreciated by CT. Nuclear scintigraphic bone scan or repeat PET-CT may be helpful to assess for residual metabolically active osseous metastatic disease. 6. Emphysema and diffuse bilateral bronchial wall thickening. 7. Coronary artery disease. 8. Large left inguinal hernia containing nonobstructed sigmoid colon.   Aortic Atherosclerosis (ICD10-I70.0) and Emphysema (ICD10-J43.9).  Patient denies changes in bowel habits.  Denies unintentional weight loss night sweats or fever. No history of blood transfusions or hepatitis. He is not on any blood thinners but takes  aspirin  for preventive purposes.  He has a history of stage 3 renal disease diagnosed three years ago after a blood test revealed elevated potassium levels. Since then, he has made significant lifestyle changes, including stopping alcohol consumption and losing weight. He now maintains a weight between 145 and 151 pounds and walks approximately 5.6 miles daily.  He has a history of skin cancer, treated with resection, and is scheduled for further treatment. His father had prostate cancer.  Patient had a normal PSA in January 2025.   Patient lives at home with wife who has multiple medical problems.  He is the caregiver for her. His daughter passed away last year.  He has granddaughter who lives in [place] .  He denies SOB, chest pain, hemoptysis.  Former smoker.  Abdominal pain has improved and he did not need to take any pain meds.   INTERVAL HISTORY Tony Mitchell is a 83 y.o. male who has above history reviewed by me today presents for follow up visit for treatment of metastatic squamous cell lung cancer.  He reports feeling ok today. Appetite is good. No fever or chills.  S/p brain radiation he finished tapering course of steroid.  +leg edema has improved.  + intermittent pin and needling sensation on his fingertips. He currently takes gabapentin  100mg  daily.SABRA    MEDICAL HISTORY:  Past Medical History:  Diagnosis Date   Acute appendicitis 03/03/2018   Arthritis    Cancer (HCC)    skin cancer   Chronic kidney disease, stage 3b (HCC)    Collapsed lung 2019   x2   COPD (chronic obstructive pulmonary disease) (HCC)    Coronary artery disease    Diabetes mellitus without complication (HCC)    diet controlled-lost 60 lbs and is not on any current meds   GERD (gastroesophageal reflux disease)    Gout    Hyperlipidemia    Hypertension    Sleep apnea    uses cpap   Thoracic aortic aneurysm (HCC)    Vitamin B 12 deficiency     SURGICAL HISTORY: Past Surgical History:   Procedure Laterality Date   CARDIAC CATHETERIZATION     CATARACT EXTRACTION  2012   CHEST TUBE INSERTION     x2   COLONOSCOPY WITH PROPOFOL  N/A 04/14/2021   Procedure: COLONOSCOPY WITH PROPOFOL ;  Surgeon: Maryruth Ole DASEN, MD;  Location: [facility] ENDOSCOPY;  Service: Endoscopy;  Laterality: N/A;   ESOPHAGOGASTRODUODENOSCOPY (EGD) WITH PROPOFOL  N/A 04/14/2021   Procedure: ESOPHAGOGASTRODUODENOSCOPY (EGD) WITH PROPOFOL ;  Surgeon: Maryruth Ole DASEN, MD;  Location: [facility] ENDOSCOPY;  Service: Endoscopy;  Laterality: N/A;   INSERTION OF MESH  09/22/2021   Procedure: INSERTION OF MESH;  Surgeon: Tye Millet, DO;  Location: [facility] ORS;  Service: General;;   JOINT REPLACEMENT     KNEE SURGERY Left 1965   LAPAROSCOPIC APPENDECTOMY N/A 03/03/2018   Procedure: APPENDECTOMY LAPAROSCOPIC;  Surgeon: Nicholaus Selinda Birmingham, MD;  Location: [facility] ORS;  Service: General;  Laterality: N/A;   TOTAL KNEE ARTHROPLASTY Left 09/16/2018   Procedure: TOTAL KNEE ARTHROPLASTY;  Surgeon: Edie Norleen JINNY, MD;  Location: [facility] ORS;  Service: Orthopedics;  Laterality: Left;    SOCIAL HISTORY: Social History   Socioeconomic History   Marital status: Married    Spouse name: Not on file   Number of children: Not on  file   Years of education: Not on file   Highest education level: Not on file  Occupational History   Not on file  Tobacco Use   Smoking status: Former    Current packs/day: 0.00    Average packs/day: 1 pack/day for 40.0 years (40.0 ttl pk-yrs)    Types: Cigarettes    Start date: 06/15/1956    Quit date: 06/15/1996    Years since quitting: 27.7    Passive exposure: Past   Smokeless tobacco: Never  Vaping Use   Vaping status: Never Used  Substance and Sexual Activity   Alcohol use: Not Currently    Alcohol/week: 21.0 standard drinks of alcohol    Types: 21 Shots of liquor per week    Comment: reports last drink 11/03/20   Drug use: No   Sexual activity: Yes  Other Topics Concern   Not on file  Social  History Narrative   Not on file   Social Drivers of Health   Financial Resource Strain: Low Risk  (10/30/2023)   Received from [facility]   Overall Financial Resource Strain (CARDIA)    Difficulty of Paying Living Expenses: Not hard at all  Food Insecurity: No Food Insecurity (10/30/2023)   Received from [facility]   Hunger Vital Sign    Within the past 12 months, you worried that your food would run out before you got the money to buy more.: Never true    Within the past 12 months, the food you bought just didn't last and you didn't have money to get more.: Never true  Transportation Needs: No Transportation Needs (10/30/2023)   Received [facility] - Transportation    In the past 12 months, has lack of transportation kept you from medical appointments or from getting medications?: No    Lack of Transportation (Non-Medical): No  Physical Activity: Sufficiently Active (07/10/2017)   Received from [facility]   Exercise Vital Sign    Days of Exercise per Week: 5 days    Minutes of Exercise per Session: 60 min  Stress: Stress Concern Present (07/10/2017)   Received from [facility] - Occupational Stress Questionnaire    Feeling of Stress : Rather much  Social Connections: Unknown (07/10/2017)   Received from [facility]   Social Connection and Isolation Panel    Frequency of Communication with Friends and Family: Patient declined    Frequency of Social Gatherings with Friends and Family: Patient declined    Attends Religious Services: Patient declined    Database administrator or Organizations: Patient declined    Attends Engineer, structural: Patient declined    Marital Status: Patient declined  Catering manager Violence: Not on file    FAMILY HISTORY: Family History  Problem Relation Age of Onset    Cancer Father        Unknown   Prostate cancer Father    Lung cancer Neg Hx     ALLERGIES:  has no known allergies.  MEDICATIONS:  Current Outpatient Medications  Medication Sig Dispense Refill   allopurinol  (ZYLOPRIM ) 100 MG tablet Take 100 mg by mouth in the morning and at bedtime.     ALPRAZolam  (XANAX ) 0.5 MG tablet Take 0.5 mg by mouth at bedtime.     amLODipine  (NORVASC ) 2.5 MG tablet Take 2.5 mg by mouth in the morning.  finasteride  (PROSCAR ) 5 MG tablet Take 1 tablet (5 mg total) by mouth daily. 90 tablet 3   furosemide  (LASIX ) 20 MG tablet Take 1 tablet (20 mg total) by mouth daily as needed. 30 tablet 0   gabapentin  (NEURONTIN ) 100 MG capsule Take 1 capsule (100 mg total) by mouth 2 (two) times daily.     hydrALAZINE  (APRESOLINE ) 25 MG tablet Take 25 mg by mouth 2 (two) times daily.     lansoprazole (PREVACID) 15 MG capsule Take 15 mg by mouth every evening.     simvastatin  (ZOCOR ) 20 MG tablet Take 1 tablet by mouth 2 (two) times a week. On  Wednesday and Sunday     tamsulosin  (FLOMAX ) 0.4 MG CAPS capsule Take 1 capsule (0.4 mg total) by mouth daily. 30 capsule 11   vitamin B-12 (CYANOCOBALAMIN ) 1000 MCG tablet Take 1,000 mcg by mouth every morning.     Vitamin D, Ergocalciferol, (DRISDOL) 1.25 MG (50000 UNIT) CAPS capsule Take 50,000 Units by mouth every 7 (seven) days.     cephALEXin  (KEFLEX ) 500 MG capsule Take 1 capsule (500 mg total) by mouth 2 (two) times daily. (Patient not taking: Reported on 02/25/2024) 14 capsule 0   colchicine 0.6 MG tablet Take 2 tablets (1.2mg ) by mouth at first sign of gout flare followed by 1 tablet (0.6mg ) after 1 hour. (Max 1.8mg  within 1 hour) (Patient not taking: Reported on 02/25/2024)     fenofibrate  (TRICOR ) 145 MG tablet Take 145 mg by mouth 2 (two) times a week. On Wednesday and Sunday (Patient not taking: Reported on 02/25/2024)     oxyCODONE  (OXY IR/ROXICODONE ) 5 MG immediate release tablet Take 1 tablet (5 mg total) by mouth every 6 (six)  hours as needed for severe pain (pain score 7-10) or breakthrough pain. (Patient not taking: Reported on 02/25/2024) 10 tablet 0   prochlorperazine  (COMPAZINE ) 10 MG tablet Take 1 tablet (10 mg total) by mouth every 6 (six) hours as needed for nausea or vomiting. (Patient not taking: Reported on 02/25/2024) 30 tablet 1   triamcinolone  ointment (KENALOG ) 0.5 % Apply 1 Application topically 2 (two) times daily. (Patient not taking: Reported on 02/25/2024) 30 g 0   No current facility-administered medications for this visit.   Facility-Administered Medications Ordered in Other Visits  Medication Dose Route Frequency Provider Last Rate Last Admin   0.9 %  sodium chloride  infusion   Intravenous Continuous Tony Call, MD 10 mL/hr at 02/04/24 0921 New Bag at 02/04/24 0921   0.9 %  sodium chloride  infusion   Intravenous Continuous Tony Call, MD 10 mL/hr at 02/25/24 0915 New Bag at 02/25/24 0915   CARBOplatin  (PARAPLATIN ) 380 mg in sodium chloride  0.9 % 100 mL chemo infusion  380 mg Intravenous Once Tony Call, MD       heparin  lock flush 100 unit/mL  500 Units Intracatheter Once PRN Tony Call, MD       PACLitaxel  (TAXOL ) 240 mg in sodium chloride  0.9 % 250 mL chemo infusion (> 80mg /m2)  135 mg/m2 (Treatment Plan Recorded) Intravenous Once Tony Call, MD 97 mL/hr at 02/25/24 1035 240 mg at 02/25/24 1035    Review of Systems  Constitutional:  Negative for appetite change, chills, fatigue, fever and unexpected weight change.  HENT:   Negative for hearing loss and voice change.   Eyes:  Negative for eye problems and icterus.  Respiratory:  Negative for chest tightness, cough and shortness of breath.   Cardiovascular:  Negative for chest pain and leg swelling.  Gastrointestinal:  Negative for abdominal distention and abdominal pain.       Bloating  Endocrine: Negative for hot flashes.  Genitourinary:  Negative for difficulty urinating, dysuria and frequency.   Musculoskeletal:  Negative for arthralgias.  Skin:   Negative for itching and rash.  Neurological:  Positive for numbness. Negative for light-headedness.  Hematological:  Negative for adenopathy. Does not bruise/bleed easily.  Psychiatric/Behavioral:  Negative for confusion.      PHYSICAL EXAMINATION: ECOG PERFORMANCE STATUS: 1 - Symptomatic but completely ambulatory  Vitals:   02/25/24 0829  BP: 138/72  Pulse: 69  Resp: 16  Temp: (!) 97.2 F (36.2 C)  SpO2: 100%   Filed Weights   02/25/24 0829  Weight: 155 lb (70.3 kg)    Physical Exam Constitutional:      General: He is not in acute distress.    Appearance: He is not diaphoretic.  HENT:     Head: Normocephalic and atraumatic.     Nose: Nose normal.  Eyes:     General: No scleral icterus. Cardiovascular:     Rate and Rhythm: Normal rate and regular rhythm.     Heart sounds: No murmur heard. Pulmonary:     Effort: Pulmonary effort is normal. No respiratory distress.  Abdominal:     General: There is no distension.     Palpations: Abdomen is soft. There is no mass.     Tenderness: There is no abdominal tenderness.  Musculoskeletal:        General: Normal range of motion.     Cervical back: Normal range of motion and neck supple.  Skin:    General: Skin is warm and dry.     Findings: No erythema.  Neurological:     Mental Status: He is alert and oriented to person, place, and time. Mental status is at baseline.     Cranial Nerves: No cranial nerve deficit.     Motor: No abnormal muscle tone.  Psychiatric:        Mood and Affect: Mood and affect normal.      LABORATORY DATA:  I have reviewed the data as listed    Latest Ref Rng & Units 02/25/2024    7:59 AM 02/04/2024    8:15 AM 01/28/2024   10:50 AM  CBC  WBC 4.0 - 10.5 K/uL 7.6  11.5  17.6   Hemoglobin 13.0 - 17.0 g/dL 87.3  86.7  87.3   Hematocrit 39.0 - 52.0 % 37.3  38.4  37.6   Platelets 150 - 400 K/uL 253  328  234       Latest Ref Rng & Units 02/25/2024    7:59 AM 02/04/2024    8:15 AM 01/22/2024    11:03 AM  CMP  Glucose 70 - 99 mg/dL 888  99  885   BUN 8 - 23 mg/dL 20  23  21    Creatinine 0.61 - 1.24 mg/dL 9.19  9.15  9.17   Sodium 135 - 145 mmol/L 135  133  131   Potassium 3.5 - 5.1 mmol/L 3.6  4.7  4.5   Chloride 98 - 111 mmol/L 100  98  95   CO2 22 - 32 mmol/L 26  26  26    Calcium 8.9 - 10.3 mg/dL 9.1  9.0  8.7   Total Protein 6.5 - 8.1 g/dL 6.6  6.2    Total Bilirubin 0.0 - 1.2 mg/dL 0.8  1.0    Alkaline Phos 38 - 126 U/L 125  156    AST 15 - 41 U/L 28  25    ALT 0 - 44 U/L 20  26       RADIOGRAPHIC STUDIES: I have personally reviewed the radiological images as listed and agreed with the findings in the report. CT CHEST ABDOMEN PELVIS W CONTRAST Result Date: 02/21/2024 CLINICAL DATA:  Lung cancer restaging * Tracking Code: BO * EXAM: CT CHEST, ABDOMEN, AND PELVIS WITH CONTRAST TECHNIQUE: Multidetector CT imaging of the chest, abdomen and pelvis was performed following the standard protocol during bolus administration of intravenous contrast. RADIATION DOSE REDUCTION: This exam was performed according to the departmental dose-optimization program which includes automated exposure control, adjustment of the mA and/or kV according to patient size and/or use of iterative reconstruction technique. CONTRAST:  OMNIPAQUE  IOHEXOL  300 MG/ML  SOLN COMPARISON:  PET-CT, 12/04/2023, CT abdomen pelvis, 11/22/2023 FINDINGS: CT CHEST FINDINGS Cardiovascular: Aortic atherosclerosis. Normal heart size. Three-vessel coronary artery calcifications. No pericardial effusion. Mediastinum/Nodes: No persistently enlarged mediastinal or left hilar lymph nodes. Frothy debris in the trachea (series 4, image 63). Heterogeneous multinodular thyroid , requiring no specific further follow-up or characterization in the setting of known metastatic malignancy. Esophagus demonstrates no significant findings. Lungs/Pleura: Moderate centrilobular emphysema. Diffuse bilateral bronchial wall thickening. Nearly complete  resolution of previously seen dependent left lower lobe masses and nodularity, with a bandlike residual measuring 2.1 x 1.1 cm (series 4, image 90). Additional small pulmonary nodules are unchanged, for example a 0.3 cm nodule in the central right upper lobe (series 4, image 75) and a 0.4 cm nodule in the right lower lobe (series 4, image 85). No pleural effusion or pneumothorax. Musculoskeletal: No chest wall abnormality. No acute osseous findings. CT ABDOMEN PELVIS FINDINGS Hepatobiliary: Significantly diminished size of numerous hypodense liver lesions, index lesion in inferior hepatic segment III measuring 2.0 x 1.8 cm, previously 4.3 x 3.8 cm (series 2, image 63). Index lesion in hepatic segment VI/7 measures 2.4 x 2.2 cm, previously 4.1 x 3.9 cm (series 2, image 67). No gallstones, gallbladder wall thickening, or biliary dilatation. Pancreas: Unremarkable. No pancreatic ductal dilatation or surrounding inflammatory changes. Spleen: Normal in size without significant abnormality. Adrenals/Urinary Tract: Adrenal glands are unremarkable. Kidneys are normal, without renal calculi, solid lesion, or hydronephrosis. Bladder is unremarkable. Stomach/Bowel: Stomach is within normal limits. Appendix appears normal. No evidence of bowel wall thickening, distention, or inflammatory changes. Vascular/Lymphatic: Aortic atherosclerosis. No enlarged abdominal or pelvic lymph nodes. Reproductive: Prostatomegaly. Other: Large left inguinal hernia containing nonobstructed sigmoid colon (series 2, image 118). No ascites. Musculoskeletal: No acute osseous findings. Disc degenerative disease and bridging osteophytosis throughout the thoracic and upper lumbar spine, in addition to ankylosis of the sacroiliac joints, in keeping with advanced DISH. Very little CT correlate of previously seen FDG avid osseous metastatic disease; subtle lucency of the T5 vertebral body at the site of a previously FDG avid metastasis (series 2, image  22). Other lesions not clearly appreciated by CT. IMPRESSION: 1. Nearly complete resolution of previously seen dependent left lower lobe masses and nodularity. Additional small nonspecific pulmonary nodules unchanged. 2. No persistently enlarged mediastinal or left hilar lymph nodes. 3. Significantly diminished size of numerous hypodense liver lesions. 4. Constellation of findings is consistent with treatment response of lung malignancy and associated metastatic disease. 5. Very little CT correlate of previously seen FDG avid osseous metastatic disease; subtle lucency of the T5 vertebral body at the site of a previously FDG avid metastasis. Other lesions not clearly appreciated by CT.  Nuclear scintigraphic bone scan or repeat PET-CT may be helpful to assess for residual metabolically active osseous metastatic disease. 6. Emphysema and diffuse bilateral bronchial wall thickening. 7. Coronary artery disease. 8. Large left inguinal hernia containing nonobstructed sigmoid colon. Aortic Atherosclerosis (ICD10-I70.0) and Emphysema (ICD10-J43.9). Electronically Signed   By: Marolyn JONETTA Jaksch M.D.   On: 02/21/2024 21:54

## 2024-02-25 NOTE — Assessment & Plan Note (Addendum)
 Imaging results and pathology results were reviewed and discussed with patient. 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 NGS showed TMB 53.2 high, PD-L1 <1%, SMARCB1- high TMB, Keytruda  was added on cycle 2 Labs are reviewed and discussed with patient. He tolerates well.  Proceed with cycle 4 Carboplatin  + Taxol  [dose reduction to 135mg /m2] + keytruda   with D3 GCSF CT to showed partial  response.

## 2024-02-25 NOTE — Assessment & Plan Note (Signed)
Treatment as planned 

## 2024-02-25 NOTE — Patient Instructions (Signed)
 [facility] - A DEPT OF [facility]  Discharge Instructions: Thank you for choosing [facility] to provide your oncology and hematology care.  If you have a lab appointment with the Cancer Center, please go directly to the Cancer Center and check in at the registration area.  Wear comfortable clothing and clothing appropriate for easy access to any Portacath or PICC line.   We strive to give you quality time with your provider. You may need to reschedule your appointment if you arrive late (15 or more minutes).  Arriving late affects you and other patients whose appointments are after yours.  Also, if you miss three or more appointments without notifying the office, you may be dismissed from the clinic at the provider's discretion.      For prescription refill requests, have your pharmacy contact our office and allow 72 hours for refills to be completed.    Today you received the following chemotherapy and/or immunotherapy agents Keytruda /Taxol  & Carboplatin       To help prevent nausea and vomiting after your treatment, we encourage you to take your nausea medication as directed.  BELOW ARE SYMPTOMS THAT SHOULD BE REPORTED IMMEDIATELY: *FEVER GREATER THAN 100.4 F (38 C) OR HIGHER *CHILLS OR SWEATING *NAUSEA AND VOMITING THAT IS NOT CONTROLLED WITH YOUR NAUSEA MEDICATION *UNUSUAL SHORTNESS OF BREATH *UNUSUAL BRUISING OR BLEEDING *URINARY PROBLEMS (pain or burning when urinating, or frequent urination) *BOWEL PROBLEMS (unusual diarrhea, constipation, pain near the anus) TENDERNESS IN MOUTH AND THROAT WITH OR WITHOUT PRESENCE OF ULCERS (sore throat, sores in mouth, or a toothache) UNUSUAL RASH, SWELLING OR PAIN  UNUSUAL VAGINAL DISCHARGE OR ITCHING   Items with * indicate a potential emergency and should be followed up as soon as possible or go to the Emergency Department if any problems should occur.  Please show the CHEMOTHERAPY ALERT CARD  or IMMUNOTHERAPY ALERT CARD at check-in to the Emergency Department and triage nurse.  Should you have questions after your visit or need to cancel or reschedule your appointment, please contact [facility] - A DEPT OF [facility]  [phone] and follow the prompts.  Office hours are 8:00 a.m. to 4:30 p.m. Monday - Friday. Please note that voicemails left after 4:00 p.m. may not be returned until the following business day.  We are closed weekends and major holidays. You have access to a nurse at all times for urgent questions. Please call the main number to the clinic [phone] and follow the prompts.  For any non-urgent questions, you may also contact your provider using MyChart. We now offer e-Visits for anyone 81 and older to request care online for non-urgent symptoms. For details visit [email or web address].   Also download the MyChart app! Go to the app store, search MyChart, open the app, select [facility], and log in with your MyChart username and password.

## 2024-02-25 NOTE — Assessment & Plan Note (Signed)
 S/p Brain Radiation.  He was see by neurology Dr. Mark Sil.

## 2024-02-25 NOTE — Assessment & Plan Note (Signed)
 He is already on gabapentin  100mg  daily, recommend to increase to 2-3 time daily.  Taxol  dose reduction.  Refer to neurology

## 2024-02-26 LAB — T4: T4, Total: 8.9 ug/dL (ref 4.5–12.0)

## 2024-02-27 ENCOUNTER — Inpatient Hospital Stay

## 2024-02-27 ENCOUNTER — Ambulatory Visit

## 2024-02-27 ENCOUNTER — Encounter: Attending: Oncology

## 2024-02-27 VITALS — Ht 67.72 in | Wt 156.2 lb

## 2024-02-27 DIAGNOSIS — Z5111 Encounter for antineoplastic chemotherapy: Secondary | ICD-10-CM | POA: Diagnosis not present

## 2024-02-27 DIAGNOSIS — C3492 Malignant neoplasm of unspecified part of left bronchus or lung: Secondary | ICD-10-CM

## 2024-02-27 MED ORDER — PEGFILGRASTIM-FPGK 6 MG/0.6ML ~~LOC~~ SOSY
6.0000 mg | PREFILLED_SYRINGE | Freq: Once | SUBCUTANEOUS | Status: AC
  Administered 2024-02-27: 6 mg via SUBCUTANEOUS
  Filled 2024-02-27: qty 0.6

## 2024-02-27 NOTE — Progress Notes (Signed)
 Daily Session Note  Patient Details  Name: Tony Mitchell MRN: [id number] Date of Birth: February 28, 1941 Referring Provider:   [name] [facility] Associated Rehabilitation & Exercise from 02/27/2024 in [facility] Cardiac and Pulmonary Rehab  Referring Provider Babara Call, MD    Encounter Date: 02/27/2024  Check In:  Session Check In - 02/27/24 1419       Check-In   Supervising physician immediately available to respond to emergencies See telemetry face sheet for immediately available ER MD    Location [facility]-Cardiac & Pulmonary Rehab    Staff Present Rollene Paterson, MS, Exercise Physiologist;Maxon Conetta BS, Exercise Physiologist    Virtual Visit No    Medication changes reported     No    Fall or balance concerns reported    No    Warm-up and Cool-down Performed on first and last piece of equipment   &   Resistance Training Performed Yes    VAD Patient? No    PAD/SET Patient? No      Pain Assessment   Currently in Pain? No/denies    Multiple Pain Sites No            Exercise Prescription Changes - 02/27/24 1400       Response to Exercise   Blood Pressure (Admit) 140/60    Blood Pressure (Exercise) 160/70    Blood Pressure (Exit) 132/76    Heart Rate (Admit) 76 bpm    Heart Rate (Exercise) 100 bpm    Heart Rate (Exit) 85 bpm    Oxygen Saturation (Admit) 96 %    Oxygen Saturation (Exercise) 91 %    Oxygen Saturation (Exit) 98 %    Rating of Perceived Exertion (Exercise) 11    Perceived Dyspnea (Exercise) 0    Symptoms R knee hurting/aching    Comments results          Social History   Tobacco Use  Smoking Status Former   Current packs/day: 0.00   Average packs/day: 1 pack/day for 40.0 years (40.0 ttl pk-yrs)   Types: Cigarettes   Start date: 06/15/1956   Quit date: 06/15/1996   Years since quitting: 27.7   Passive exposure: Past  Smokeless Tobacco Never    Goals Met:  Independence with exercise equipment Exercise tolerated well No report of  concerns or symptoms today  Goals Unmet:  Not Applicable  Comments: First full day of exercise!  Patient was oriented to gym and equipment including functions, settings, policies, and procedures.  Patient's individual exercise prescription and treatment plan were reviewed.  All starting workloads were established based on the results of the 6 minute walk test done today and listed below.  The plan for exercise progression was also introduced and progression will be customized based on patient's performance and goals.   6 Minute Walk     Row Name 02/27/24 1420         6 Minute Walk   Phase Initial     Distance 1060 feet     Walk Time 6 minutes     # of Rest Breaks 0     MPH 2.01     METS 2.26     RPE 11     Perceived Dyspnea  0     VO2 Peak 7.92     Symptoms Yes (comment)     Comments R knee hurting/aching     Resting HR 76 bpm     Resting BP 140/60     Resting Oxygen Saturation  96 %     Exercise Oxygen Saturation  during 6 min walk 91 %     Max Ex. HR 100 bpm     Max Ex. BP 160/70     2 Minute Post BP 132/76          Dr. Oneil Pinal is Medical Director for [facility] Cardiac Rehabilitation.  Dr. Fuad Aleskerov is Medical Director for [facility] Pulmonary Rehabilitation.

## 2024-02-27 NOTE — Patient Instructions (Signed)
 Patient Instructions  Patient Details  Name: Tony Mitchell MRN: [id number] Date of Birth: 1941-05-11 Referring Provider:  Babara Call, MD  Below are your personal goals for exercise, nutrition, and risk factors. Our goal is to help you stay on track towards obtaining and maintaining these goals. We will be discussing your progress on these goals with you throughout the program.  Initial Exercise Prescription:  Initial Exercise Prescription - 02/27/24 1400       Date of Initial Exercise RX and Referring Provider   Date 02/27/24    Referring Provider Babara Call, MD      Oxygen   Maintain Oxygen Saturation 88% or higher      Recumbant Bike   Level 2    RPM 50    Watts 25    Minutes 15    METs 2.26      NuStep   Level 2    SPM 80    Minutes 15    METs 2.26      REL-XR   Level 1    Speed 50    Minutes 15    METs 2.26      Rower   Level 6    Watts 25    Minutes 15    METs 2.26      Track   Laps 23    Minutes 15    METs 2.25      Prescription Details   Frequency (times per week) 2    Duration Progress to 30 minutes of continuous aerobic without signs/symptoms of physical distress      Intensity   THRR 40-80% of Max Heartrate 100-125    Ratings of Perceived Exertion 11-13    Perceived Dyspnea 0-4      Progression   Progression Continue to progress workloads to maintain intensity without signs/symptoms of physical distress.      Resistance Training   Training Prescription Yes    Weight 4 lb    Reps 10-15          Exercise Goals: Frequency: Be able to perform aerobic exercise two to three times per week in program working toward 2-5 days per week of home exercise.  Intensity: Work with a perceived exertion of 11 (fairly light) - 15 (hard) while following your exercise prescription.  We will make changes to your prescription with you as you progress through the program.   Duration: Be able to do 30 to 45 minutes of continuous aerobic exercise in  addition to a 5 minute warm-up and a 5 minute cool-down routine.   Nutrition Goals: Your personal nutrition goals will be established when you do your nutrition analysis with the dietician.  The following are general nutrition guidelines to follow: Cholesterol < 200mg /day Sodium < 1500mg /day Fiber: Men over 50 yrs - 30 grams per day  Tobacco Use Initial Evaluation: Social History   Tobacco Use  Smoking Status Former   Current packs/day: 0.00   Average packs/day: 1 pack/day for 40.0 years (40.0 ttl pk-yrs)   Types: Cigarettes   Start date: 06/15/1956   Quit date: 06/15/1996   Years since quitting: 27.7   Passive exposure: Past  Smokeless Tobacco Never    Exercise Goals and Review:  Exercise Goals     Row Name 02/27/24 1429             Exercise Goals   Increase Physical Activity Yes       Intervention Provide advice, education, support and counseling about physical  activity/exercise needs.;Develop an individualized exercise prescription for aerobic and resistive training based on initial evaluation findings, risk stratification, comorbidities and participant's personal goals.       Expected Outcomes Short Term: Attend rehab on a regular basis to increase amount of physical activity.;Long Term: Add in home exercise to make exercise part of routine and to increase amount of physical activity.;Long Term: Exercising regularly at least 3-5 days a week.       Increase Strength and Stamina Yes       Intervention Develop an individualized exercise prescription for aerobic and resistive training based on initial evaluation findings, risk stratification, comorbidities and participant's personal goals.;Provide advice, education, support and counseling about physical activity/exercise needs.       Expected Outcomes Short Term: Increase workloads from initial exercise prescription for resistance, speed, and METs.;Short Term: Perform resistance training exercises routinely during rehab and  add in resistance training at home;Long Term: Improve cardiorespiratory fitness, muscular endurance and strength as measured by increased METs and functional capacity ( )       Able to understand and use rate of perceived exertion (RPE) scale Yes       Intervention Provide education and explanation on how to use RPE scale       Expected Outcomes Short Term: Able to use RPE daily in rehab to express subjective intensity level;Long Term:  Able to use RPE to guide intensity level when exercising independently       Able to understand and use Dyspnea scale Yes       Intervention Provide education and explanation on how to use Dyspnea scale       Expected Outcomes Short Term: Able to use Dyspnea scale daily in rehab to express subjective sense of shortness of breath during exertion;Long Term: Able to use Dyspnea scale to guide intensity level when exercising independently       Knowledge and understanding of Target Heart Rate Range (THRR) Yes       Intervention Provide education and explanation of THRR including how the numbers were predicted and where they are located for reference       Expected Outcomes Short Term: Able to state/look up THRR;Short Term: Able to use daily as guideline for intensity in rehab;Long Term: Able to use THRR to govern intensity when exercising independently       Able to check pulse independently Yes       Intervention Provide education and demonstration on how to check pulse in carotid and radial arteries.;Review the importance of being able to check your own pulse for safety during independent exercise       Expected Outcomes Short Term: Able to explain why pulse checking is important during independent exercise;Long Term: Able to check pulse independently and accurately       Understanding of Exercise Prescription Yes       Intervention Provide education, explanation, and written materials on patient's individual exercise prescription       Expected Outcomes Short Term:  Able to explain program exercise prescription;Long Term: Able to explain home exercise prescription to exercise independently

## 2024-03-03 ENCOUNTER — Encounter

## 2024-03-03 DIAGNOSIS — C3492 Malignant neoplasm of unspecified part of left bronchus or lung: Secondary | ICD-10-CM

## 2024-03-03 NOTE — Progress Notes (Signed)
 Daily Session Note  Patient Details  Name: Tony Mitchell MRN: [id number] Date of Birth: 12/19/1940 Referring Provider:   [name] [facility] Associated Rehabilitation & Exercise from 02/27/2024 in [facility] Cardiac and Pulmonary Rehab  Referring Provider Babara Call, MD    Encounter Date: 03/03/2024  Check In:  Session Check In - 03/03/24 1509       Check-In   Supervising physician immediately available to respond to emergencies See telemetry face sheet for immediately available ER MD    Location [facility]-Cardiac & Pulmonary Rehab    Staff Present Rollene Paterson, MS, Exercise Physiologist    Virtual Visit No    Medication changes reported     Yes    Comments Not taking BP meds at the moment because BP has been low    Fall or balance concerns reported    No    Warm-up and Cool-down Performed on first and last piece of equipment    Resistance Training Performed Yes    VAD Patient? No    PAD/SET Patient? No      Pain Assessment   Currently in Pain? No/denies    Multiple Pain Sites No             Social History   Tobacco Use  Smoking Status Former   Current packs/day: 0.00   Average packs/day: 1 pack/day for 40.0 years (40.0 ttl pk-yrs)   Types: Cigarettes   Start date: 06/15/1956   Quit date: 06/15/1996   Years since quitting: 27.7   Passive exposure: Past  Smokeless Tobacco Never    Goals Met:  Independence with exercise equipment Exercise tolerated well No report of concerns or symptoms today  Goals Unmet:  Not Applicable  Comments: Pt able to follow exercise prescription today without complaint.  Will continue to monitor for progression.    Dr. Oneil Pinal is Medical Director for [facility] Cardiac Rehabilitation.  Dr. Fuad Aleskerov is Medical Director for [facility] Pulmonary Rehabilitation.

## 2024-03-05 ENCOUNTER — Encounter

## 2024-03-05 ENCOUNTER — Telehealth: Payer: Self-pay | Admitting: *Deleted

## 2024-03-05 DIAGNOSIS — C3492 Malignant neoplasm of unspecified part of left bronchus or lung: Secondary | ICD-10-CM

## 2024-03-05 NOTE — Telephone Encounter (Signed)
 Pt called in to let Dr. Babara know about a couple of things that he has noticed since his last treatment. His BP has been running low (90/60's per pt) so he stopped taking his amlodipine  and hydralazine  for about 1 week which has helped improve his BP readings at home. Pt states that he will continue to monitor his BP while he has stopped his BP meds.   Also, the numbness/tingling in both of his feet has gotten worse since last treatment. He wanted to make sure Dr. Babara was aware in case she had any recommendations or needed to make any adjustments to his treatment plan.   Pt is scheduled to consult neurologist on 8/5 at [facility] neurology.   He also has planned a trip to [place]  to visit family 9/6-9/14.

## 2024-03-05 NOTE — Progress Notes (Signed)
 Daily Session Note  Patient Details  Name: Tony Mitchell MRN: [id number] Date of Birth: [birth year] Referring Provider:   [name] Cancer Associated Rehabilitation & Exercise from 02/27/2024 in [facility] Cardiac and Pulmonary Rehab  Referring Provider Babara Call, MD    Encounter Date: 03/05/2024  Check In:  Session Check In - 03/05/24 1214       Check-In   Supervising physician immediately available to respond to emergencies See telemetry face sheet for immediately available ER MD    Location [facility]-Cardiac & Pulmonary Rehab    Staff Present Rollene Paterson, MS, Exercise Physiologist;Maxon Conetta BS, Exercise Physiologist    Virtual Visit No    Medication changes reported     No    Fall or balance concerns reported    No    Warm-up and Cool-down Performed on first and last piece of equipment    Resistance Training Performed Yes    VAD Patient? No    PAD/SET Patient? No      Pain Assessment   Currently in Pain? No/denies    Multiple Pain Sites No             Social History   Tobacco Use  Smoking Status Former   Current packs/day: 0.00   Average packs/day: 1 pack/day for 40.0 years (40.0 ttl pk-yrs)   Types: Cigarettes   Start date: 06/15/1956   Quit date: 06/15/1996   Years since quitting: 27.7   Passive exposure: Past  Smokeless Tobacco Never    Goals Met:  Independence with exercise equipment Exercise tolerated well No report of concerns or symptoms today  Goals Unmet:  Not Applicable  Comments: Pt able to follow exercise prescription today without complaint.  Will continue to monitor for progression.    Dr. Oneil Pinal is Medical Director for [facility] Cardiac Rehabilitation.  Dr. Fuad Aleskerov is Medical Director for [facility] Pulmonary Rehabilitation.

## 2024-03-10 ENCOUNTER — Encounter

## 2024-03-10 DIAGNOSIS — C3492 Malignant neoplasm of unspecified part of left bronchus or lung: Secondary | ICD-10-CM

## 2024-03-10 NOTE — Progress Notes (Signed)
 Daily Session Note  Patient Details  Name: Tony Mitchell MRN: [id number] Date of Birth: 02-26-1941 Referring Provider:   [name] Cancer Associated Rehabilitation & Exercise from 02/27/2024 in [facility] Cardiac and Pulmonary Rehab  Referring Provider Babara Call, MD    Encounter Date: 03/10/2024  Check In:  Session Check In - 03/10/24 1412       Check-In   Supervising physician immediately available to respond to emergencies See telemetry face sheet for immediately available ER MD    Location [facility]-Cardiac & Pulmonary Rehab    Staff Present Rollene Paterson, MS, Exercise Physiologist    Virtual Visit No    Medication changes reported     Yes    Comments Cephalexin  500mg     Fall or balance concerns reported    No    Warm-up and Cool-down Performed on first and last piece of equipment    Resistance Training Performed Yes    VAD Patient? No    PAD/SET Patient? No      Pain Assessment   Currently in Pain? No/denies    Multiple Pain Sites No             Social History   Tobacco Use  Smoking Status Former   Current packs/day: 0.00   Average packs/day: 1 pack/day for 40.0 years (40.0 ttl pk-yrs)   Types: Cigarettes   Start date: 06/15/1956   Quit date: 06/15/1996   Years since quitting: 27.7   Passive exposure: Past  Smokeless Tobacco Never    Goals Met:  Independence with exercise equipment Exercise tolerated well No report of concerns or symptoms today  Goals Unmet:  Not Applicable  Comments: Pt able to follow exercise prescription today without complaint.  Will continue to monitor for progression.    Dr. Oneil Pinal is Medical Director for [facility] Cardiac Rehabilitation.  Dr. Fuad Aleskerov is Medical Director for [facility] Pulmonary Rehabilitation.

## 2024-03-12 ENCOUNTER — Encounter

## 2024-03-12 DIAGNOSIS — C3492 Malignant neoplasm of unspecified part of left bronchus or lung: Secondary | ICD-10-CM

## 2024-03-12 NOTE — Progress Notes (Signed)
 Daily Session Note  Patient Details  Name: Tony Mitchell MRN: [id number] Date of Birth: June 24, 1941 Referring Provider:   [name] Cancer Associated Rehabilitation & Exercise from 02/27/2024 in [facility] Cardiac and Pulmonary Rehab  Referring Provider Babara Call, MD    Encounter Date: 03/12/2024  Check In:  Session Check In - 03/12/24 1232       Check-In   Supervising physician immediately available to respond to emergencies See telemetry face sheet for immediately available ER MD    Location [facility]-Cardiac & Pulmonary Rehab    Staff Present Rollene Paterson, MS, Exercise Physiologist    Virtual Visit No    Medication changes reported     No    Fall or balance concerns reported    No    Warm-up and Cool-down Performed on first and last piece of equipment    Resistance Training Performed Yes    VAD Patient? No    PAD/SET Patient? No      Pain Assessment   Currently in Pain? No/denies    Multiple Pain Sites No             Social History   Tobacco Use  Smoking Status Former   Current packs/day: 0.00   Average packs/day: 1 pack/day for 40.0 years (40.0 ttl pk-yrs)   Types: Cigarettes   Start date: 06/15/1956   Quit date: 06/15/1996   Years since quitting: 27.7   Passive exposure: Past  Smokeless Tobacco Never    Goals Met:  Independence with exercise equipment Exercise tolerated well No report of concerns or symptoms today  Goals Unmet:  Not Applicable  Comments: Pt able to follow exercise prescription today without complaint.  Will continue to monitor for progression.    Dr. Oneil Pinal is Medical Director for [facility] Cardiac Rehabilitation.  Dr. Fuad Aleskerov is Medical Director for [facility] Pulmonary Rehabilitation.

## 2024-03-14 ENCOUNTER — Encounter: Payer: Self-pay | Admitting: Internal Medicine

## 2024-03-14 NOTE — Progress Notes (Signed)
 Pt called re: " head cold" - recommended Claritin D.  Pt will take corcidin.  GB

## 2024-03-17 ENCOUNTER — Inpatient Hospital Stay

## 2024-03-17 ENCOUNTER — Encounter: Payer: Self-pay | Admitting: Oncology

## 2024-03-17 ENCOUNTER — Inpatient Hospital Stay (HOSPITAL_BASED_OUTPATIENT_CLINIC_OR_DEPARTMENT_OTHER): Admitting: Oncology

## 2024-03-17 VITALS — BP 123/65 | HR 70

## 2024-03-17 VITALS — BP 143/66 | HR 69 | Temp 97.0°F | Resp 18 | Wt 156.9 lb

## 2024-03-17 DIAGNOSIS — T451X5A Adverse effect of antineoplastic and immunosuppressive drugs, initial encounter: Secondary | ICD-10-CM

## 2024-03-17 DIAGNOSIS — C7931 Secondary malignant neoplasm of brain: Secondary | ICD-10-CM

## 2024-03-17 DIAGNOSIS — G620 Drug-induced polyneuropathy: Secondary | ICD-10-CM

## 2024-03-17 DIAGNOSIS — C3492 Malignant neoplasm of unspecified part of left bronchus or lung: Secondary | ICD-10-CM

## 2024-03-17 DIAGNOSIS — R634 Abnormal weight loss: Secondary | ICD-10-CM

## 2024-03-17 DIAGNOSIS — Z5112 Encounter for antineoplastic immunotherapy: Secondary | ICD-10-CM | POA: Insufficient documentation

## 2024-03-17 DIAGNOSIS — Z5111 Encounter for antineoplastic chemotherapy: Secondary | ICD-10-CM | POA: Diagnosis not present

## 2024-03-17 LAB — CMP (CANCER CENTER ONLY)
ALT: 18 U/L (ref 0–44)
AST: 24 U/L (ref 15–41)
Albumin: 4.1 g/dL (ref 3.5–5.0)
Alkaline Phosphatase: 102 U/L (ref 38–126)
Anion gap: 8 (ref 5–15)
BUN: 15 mg/dL (ref 8–23)
CO2: 25 mmol/L (ref 22–32)
Calcium: 9 mg/dL (ref 8.9–10.3)
Chloride: 101 mmol/L (ref 98–111)
Creatinine: 0.75 mg/dL (ref 0.61–1.24)
GFR, Estimated: >60 mL/min (ref >60)
Glucose, Bld: 105 mg/dL — ABNORMAL HIGH (ref 70–99)
Potassium: 4 mmol/L (ref 3.5–5.1)
Sodium: 134 mmol/L — ABNORMAL LOW (ref 135–145)
Total Bilirubin: 0.7 mg/dL (ref 0.0–1.2)
Total Protein: 6.4 g/dL — ABNORMAL LOW (ref 6.5–8.1)

## 2024-03-17 LAB — CBC WITH DIFFERENTIAL (CANCER CENTER ONLY)
Abs Immature Granulocytes: 0.01 K/uL (ref 0.00–0.07)
Basophils Absolute: 0 K/uL (ref 0.0–0.1)
Basophils Relative: 1 %
Eosinophils Absolute: 0 K/uL (ref 0.0–0.5)
Eosinophils Relative: 0 %
HCT: 37.3 % — ABNORMAL LOW (ref 39.0–52.0)
Hemoglobin: 12.7 g/dL — ABNORMAL LOW (ref 13.0–17.0)
Immature Granulocytes: 0 %
Lymphocytes Relative: 32 %
Lymphs Abs: 1.8 K/uL (ref 0.7–4.0)
MCH: 31.5 pg (ref 26.0–34.0)
MCHC: 34 g/dL (ref 30.0–36.0)
MCV: 92.6 fL (ref 80.0–100.0)
Monocytes Absolute: 0.7 K/uL (ref 0.1–1.0)
Monocytes Relative: 13 %
Neutro Abs: 3 K/uL (ref 1.7–7.7)
Neutrophils Relative %: 54 %
Platelet Count: 228 K/uL (ref 150–400)
RBC: 4.03 MIL/uL — ABNORMAL LOW (ref 4.22–5.81)
RDW: 17.3 % — ABNORMAL HIGH (ref 11.5–15.5)
WBC Count: 5.6 K/uL (ref 4.0–10.5)
nRBC: 0 % (ref 0.0–0.2)

## 2024-03-17 MED ORDER — SODIUM CHLORIDE 0.9 % IV SOLN
200.0000 mg | Freq: Once | INTRAVENOUS | Status: AC
  Administered 2024-03-17: 200 mg via INTRAVENOUS
  Filled 2024-03-17: qty 8

## 2024-03-17 MED ORDER — SODIUM CHLORIDE 0.9 % IV SOLN
INTRAVENOUS | Status: DC
  Filled 2024-03-17: qty 250

## 2024-03-17 NOTE — Assessment & Plan Note (Addendum)
 Gabapentin  was recently increased to 2-3 time daily.  Refer to neurology-he has appointment in early August 2025. Referred to acupuncture clinic

## 2024-03-17 NOTE — Assessment & Plan Note (Signed)
Follow up with nutritionist.  

## 2024-03-17 NOTE — Assessment & Plan Note (Addendum)
 Imaging results and pathology results were reviewed and discussed with patient. 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 NGS showed TMB 53.2 high, PD-L1 <1%, SMARCB1- high TMB, Keytruda  was added on cycle 2 CT scan after 3 cycles showed partial response.  Status post 4 cycles of carboplatin , Taxol  and 3 doses of Keytruda . Labs are reviewed and discussed with patient.  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 Patient has high TMB. Recommend to hold off carboplatin  and Taxol , continue Keytruda  for maintenance.   Plan to repeat CT in September.

## 2024-03-17 NOTE — Assessment & Plan Note (Signed)
 Keytruda  treatments as planned

## 2024-03-17 NOTE — Progress Notes (Signed)
 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  Receiving keytruda  only  Met with patient during infusion.  Reports that his appetite is good.  I am hungry all the time.  Has enrolled in CARE program.  Before diagnosis was walking 5 miles a day which helped him keep his weight down.  Reports neuropathy in feet and knee pain recently.  Eating a wide variety of foods    Medications: reviewed  Labs: reviewed  Anthropometrics:   Weight 156 lb 14.4 oz today  147 lb on 6/17 151 lb on 5/27 UBW of 145-151 lb    NUTRITION DIAGNOSIS: none at this time    INTERVENTION:  Discussed some strategies to help to prevent further weight gain Continue exercise as able Encouraged well balanced diet including lean protein    MONITORING, EVALUATION, GOAL: weight trends, intake   NEXT VISIT: as needed  Seward Coran B. Dasie SOLON, CSO, LDN Registered Dietitian [phone]

## 2024-03-17 NOTE — Assessment & Plan Note (Signed)
 S/p Brain Radiation.  He was see by neurology Dr. Mark Sil.

## 2024-03-17 NOTE — Progress Notes (Signed)
 Hematology/Oncology Progress note [phone] [phone]     CHIEF COMPLAINTS/PURPOSE OF CONSULTATION:  Metastatic squamous cell carcinoma  ASSESSMENT & PLAN:   Cancer Staging  Squamous cell carcinoma lung, left (HCC) Staging form: Lung, AJCC V9 - Clinical stage from 12/17/2023: Tony Mitchell, pM1c - Signed by Babara Call, MD on 12/17/2023   Squamous cell carcinoma lung, left [facility]) Imaging results and pathology results were reviewed and discussed with patient. 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 NGS showed TMB 53.2 high, PD-L1 <1%, SMARCB1- high TMB, Keytruda  was added on cycle 2 CT scan after 3 cycles showed partial response.  Status post 4 cycles of carboplatin , Taxol  and 3 doses of Keytruda . Labs are reviewed and discussed with patient.  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 Patient has high TMB. Recommend to hold off carboplatin  and Taxol , continue Keytruda  for maintenance.   Plan to repeat CT in September.    Chemotherapy-induced neuropathy (HCC) Gabapentin  was recently increased to 2-3 time daily.  Refer to neurology-he has appointment in early August 2025. 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  He was see by neurology Dr. Buckley.   Weight loss Follow up with  nutritionist    No orders of the defined types were placed in this encounter.  Follow up  3 weeks  All questions were answered. The patient knows to call the clinic with any problems, questions or concerns.  Call Babara, MD, PhD [facility] Hematology Oncology 03/17/2024    HISTORY OF PRESENTING ILLNESS:  Tony Mitchell 83 y.o. male presents for follow up of stage IV lung SCC  Oncology History  Squamous cell carcinoma lung, left (HCC)  11/22/2023 Imaging   CT abdomen pelvis w contrast  1.  Multiple hepatic metastatic lesions. Correlation with history of known malignancy recommended. 2. Serosal implant along the distal stomach versus gastrohepatic adenopathy. 3. Thickened appearance of the gastric antrum and pylorus likely related to underdistention. An infiltrative mass is less likely but not excluded. Endoscopy may provide better evaluation. 4. Herniation of a segment of the sigmoid colon into the left inguinal canal. No bowel obstruction.     12/04/2023 Imaging   PET scan showed  Extensive hypermetabolic neoplastic disease including left lower lobe infiltrative mass extending from the pleura to the hilum with numerous abnormal left-sided hilar and mediastinal nodes.   Additional upper retroperitoneal nodes as well as extensive liver metastases.   Multifocal osseous metastatic disease including the thoracic and lumbar spine, pelvis, right humeral head and left scapula.   Based on the overall appearance and with the patient's laboratory a abnormality this very well could be a primary left lower lobe lung lesion such as small cell versus a neuroendocrine tumor with diffuse metastatic disease.   12/10/2023 Imaging   MRI brain w wo contrast  1. 10 x 9 x 10 mm rounded lesion in the posterior left frontal lobe with surrounding vasogenic edema is consistent with metastatic disease. 2. No other pathologic enhancement is present. 3. Periventricular and scattered subcortical T2 hyperintensities bilaterally are mildly advanced for age. The finding is nonspecific but can be seen in the setting of chronic microvascular ischemia, a demyelinating process such as multiple sclerosis, vasculitis, complicated migraine headaches, or as the sequelae of a prior infectious or inflammatory process.   12/17/2023 Initial Diagnosis   Squamous cell carcinoma lung, left (HCC)  He  began experiencing abdominal pain for a week.  Initially presenting as increased gas and stomach growling  despite having eaten. The pain was localized between the rib cage and the abdomen and described as intermittent.  Later the pain had resolved, but he continues to feel bloated. He has CT abdomen pelvis done which showed liver masses.   Liver biopsy showed  1. Liver, biopsy, right hepatic lesion :      - METASTATIC POORLY DIFFERENTIATED SQUAMOUS CELL CARCINOMA WITH NECROSIS.  Diagnosis Note : Per [facility] a recent PET scan revealed a hypermetabolic left lower lobe lung mass with extensive metastatic lesions. The carcinoma is positive for cytokeratin 7, p40 (greater than 50%), and CD56 (subset). Cytokeratin 20, TTF-1,  chromogranin, synaptophysin, and CDX-2 are negative. In the absence of  chromogranin / synaptophysin staining the presence of patchy CD56 staining is interpreted as non-specific. The pattern of immunoreactivity is consistent with metastatic poorly differentiated squamous cell carcinoma.     12/17/2023 Cancer Staging   Staging form: Lung, AJCC V9 - Clinical stage from 12/17/2023: Tony Mitchell, pM1c - Signed by Babara Call, MD on 12/17/2023 Histopathologic type: Squamous cell carcinoma, NOS Stage prefix: Initial diagnosis Laterality: Left Sites of metastasis: Bone, Liver, Brain   12/23/2023 - 12/25/2023 Chemotherapy   Patient is on Treatment Plan : LUNG Carboplatin  + Paclitaxel  q21d     01/14/2024 -  Chemotherapy   Carboplatin  + Paclitaxel  + Pembrolizumab  (200) D1 q21d x 4 cycles / Pembrolizumab  (200) Maintenance D1 q21d       Imaging   CT chest abdomen pelvis w contrast  1. Nearly complete resolution of previously seen dependent left lower lobe masses and nodularity. Additional small nonspecific pulmonary nodules unchanged. 2. No persistently enlarged mediastinal or left hilar lymph nodes. 3. Significantly diminished size of numerous hypodense liver lesions. 4. Constellation of findings is consistent with treatment response of lung malignancy and associated metastatic disease. 5. Very  little CT correlate of previously seen FDG avid osseous metastatic disease; subtle lucency of the T5 vertebral body at the site of a previously FDG avid metastasis. Other lesions not clearly appreciated by CT. Nuclear scintigraphic bone scan or repeat PET-CT may be helpful to assess for residual metabolically active osseous metastatic disease. 6. Emphysema and diffuse bilateral bronchial wall thickening. 7. Coronary artery disease. 8. Large left inguinal hernia containing nonobstructed sigmoid colon.   Aortic Atherosclerosis (ICD10-I70.0) and Emphysema (ICD10-J43.9).   02/18/2024 Imaging   CT chest abdomen pelvis w contrast showed 1. Nearly complete resolution of previously seen dependent left lower lobe masses and nodularity. Additional small nonspecific pulmonary nodules unchanged. 2. No persistently enlarged mediastinal or left hilar lymph nodes. 3. Significantly diminished size of numerous hypodense liver lesions. 4. Constellation of findings is consistent with treatment response of lung malignancy and associated metastatic disease. 5. Very little CT correlate of previously seen FDG avid osseous metastatic disease; subtle lucency of the T5 vertebral body at the site of a previously FDG avid metastasis. Other lesions not clearly appreciated by CT. Nuclear scintigraphic bone scan or repeat PET-CT may be helpful to assess for residual metabolically active osseous metastatic disease. 6. Emphysema and diffuse bilateral bronchial wall thickening. 7. Coronary artery disease. 8. Large left inguinal hernia containing nonobstructed sigmoid colon.   Aortic Atherosclerosis (ICD10-I70.0) and Emphysema (ICD10-J43.9).     Patient denies changes in bowel habits.  Denies unintentional weight loss night sweats or fever. No history of blood transfusions or hepatitis. He is not on any blood thinners but takes aspirin   for preventive purposes.  He has a history of stage 3 renal disease diagnosed  three years ago after a blood test revealed elevated potassium levels. Since then, he has made significant lifestyle changes, including stopping alcohol consumption and losing weight. He now maintains a weight between 145 and 151 pounds and walks approximately 5.6 miles daily.  He has a history of skin cancer, treated with resection, and is scheduled for further treatment. His father had prostate cancer.  Patient had a normal PSA in January 2025.   Patient lives at home with wife who has multiple medical problems.  He is the caregiver for her. His daughter passed away last year.  He has granddaughter who lives in [place] .  He denies SOB, chest pain, hemoptysis.  Former smoker.  Abdominal pain has improved and he did not need to take any pain meds.   INTERVAL HISTORY Tony Mitchell is a 83 y.o. male who has above history reviewed by me today presents for follow up visit for treatment of metastatic squamous cell lung cancer.   S/p brain radiation he finished tapering course of steroid.   +  pin and needling sensation on his fingertips, significantly worse since cycle 4 chemotherapy.  He takes gabapentin  100 mg 2-3 times a day. Feeling more fatigued after recent chemotherapy.  Weight has been stable.  MEDICAL HISTORY:  Past Medical History:  Diagnosis Date   Acute appendicitis 03/03/2018   Arthritis    Cancer (HCC)    skin cancer   Chronic kidney disease, stage 3b (HCC)    Collapsed lung 2019   x2   COPD (chronic obstructive pulmonary disease) (HCC)    Coronary artery disease    Diabetes mellitus without complication (HCC)    diet controlled-lost 60 lbs and is not on any current meds   GERD (gastroesophageal reflux disease)    Gout    Hyperlipidemia    Hypertension    Sleep apnea    uses cpap   Thoracic aortic aneurysm (HCC)    Vitamin B 12 deficiency     SURGICAL HISTORY: Past Surgical History:  Procedure Laterality Date   CARDIAC CATHETERIZATION     CATARACT EXTRACTION   2012   CHEST TUBE INSERTION     x2   COLONOSCOPY WITH PROPOFOL  N/A 04/14/2021   Procedure: COLONOSCOPY WITH PROPOFOL ;  Surgeon: Maryruth Ole DASEN, MD;  Location: [facility] ENDOSCOPY;  Service: Endoscopy;  Laterality: N/A;   ESOPHAGOGASTRODUODENOSCOPY (EGD) WITH PROPOFOL  N/A 04/14/2021   Procedure: ESOPHAGOGASTRODUODENOSCOPY (EGD) WITH PROPOFOL ;  Surgeon: Maryruth Ole DASEN, MD;  Location: [facility] ENDOSCOPY;  Service: Endoscopy;  Laterality: N/A;   INSERTION OF MESH  09/22/2021   Procedure: INSERTION OF MESH;  Surgeon: Tye Millet, DO;  Location: [facility] ORS;  Service: General;;   JOINT REPLACEMENT     KNEE SURGERY Left 1965   LAPAROSCOPIC APPENDECTOMY N/A 03/03/2018   Procedure: APPENDECTOMY LAPAROSCOPIC;  Surgeon: Nicholaus Selinda Birmingham, MD;  Location: [facility] ORS;  Service: General;  Laterality: N/A;   TOTAL KNEE ARTHROPLASTY Left 09/16/2018   Procedure: TOTAL KNEE ARTHROPLASTY;  Surgeon: Edie Norleen JINNY, MD;  Location: [facility] ORS;  Service: Orthopedics;  Laterality: Left;    SOCIAL HISTORY: Social History   Socioeconomic History   Marital status: Married    Spouse name: Not on file   Number of children: Not on file   Years of education: Not on file   Highest education level: Not on file  Occupational History   Not on file  Tobacco Use  Smoking status: Former    Current packs/day: 0.00    Average packs/day: 1 pack/day for 40.0 years (40.0 ttl pk-yrs)    Types: Cigarettes    Start date: 06/15/1956    Quit date: 06/15/1996    Years since quitting: 27.7    Passive exposure: Past   Smokeless tobacco: Never  Vaping Use   Vaping status: Never Used  Substance and Sexual Activity   Alcohol use: Not Currently    Alcohol/week: 21.0 standard drinks of alcohol    Types: 21 Shots of liquor per week    Comment: reports last drink 11/03/20   Drug use: No   Sexual activity: Yes  Other Topics Concern   Not on file  Social History Narrative   Not on file   Social Drivers of Health   Financial  Resource Strain: Low Risk  (10/30/2023)   Received from [facility]   Overall Financial Resource Strain (CARDIA)    Difficulty of Paying Living Expenses: Not hard at all  Food Insecurity: No Food Insecurity (10/30/2023)   Received from [facility]   Hunger Vital Sign    Within the past 12 months, you worried that your food would run out before you got the money to buy more.: Never true    Within the past 12 months, the food you bought just didn't last and you didn't have money to get more.: Never true  Transportation Needs: No Transportation Needs (10/30/2023)   Received from [facility] - Transportation    In the past 12 months, has lack of transportation kept you from medical appointments or from getting medications?: No    Lack of Transportation (Non-Medical): No  Physical Activity: Sufficiently Active (07/10/2017)   Received from [facility]   Exercise Vital Sign    Days of Exercise per Week: 5 days    Minutes of Exercise per Session: 60 min  Stress: Stress Concern Present (07/10/2017)   Received from [facility] - Occupational Stress Questionnaire    Feeling of Stress : Rather much  Social Connections: Unknown (07/10/2017)   Received from [facility]   Social Connection and Isolation Panel    Frequency of Communication with Friends and Family: Patient declined    Frequency of Social Gatherings with Friends and Family: Patient declined    Attends Religious Services: Patient declined    Database administrator or Organizations: Patient declined    Attends Engineer, structural: Patient declined    Marital Status: Patient declined  Catering manager Violence: Not on file    FAMILY HISTORY: Family History  Problem Relation Age of Onset   Cancer Father        Unknown   Prostate cancer Father    Lung cancer Neg Hx      ALLERGIES:  has no known allergies.  MEDICATIONS:  Current Outpatient Medications  Medication Sig Dispense Refill   allopurinol  (ZYLOPRIM ) 100 MG tablet Take 100 mg by mouth in the morning and at bedtime.     ALPRAZolam  (XANAX ) 0.5 MG tablet Take 0.5 mg by mouth at bedtime.     amLODipine  (NORVASC ) 2.5 MG tablet Take 2.5 mg by mouth in the morning.     cephALEXin  (KEFLEX ) 500 MG capsule Take 500 mg by mouth.     finasteride  (PROSCAR ) 5 MG tablet Take 1 tablet (5 mg total) by mouth daily. 90  tablet 3   furosemide  (LASIX ) 20 MG tablet Take 1 tablet (20 mg total) by mouth daily as needed. 30 tablet 0   gabapentin  (NEURONTIN ) 100 MG capsule Take 1 capsule (100 mg total) by mouth 2 (two) times daily.     hydrALAZINE  (APRESOLINE ) 25 MG tablet Take 25 mg by mouth 2 (two) times daily.     lansoprazole (PREVACID) 15 MG capsule Take 15 mg by mouth every evening.     simvastatin  (ZOCOR ) 20 MG tablet Take 1 tablet by mouth 2 (two) times a week. On  Wednesday and Sunday     tamsulosin  (FLOMAX ) 0.4 MG CAPS capsule Take 1 capsule (0.4 mg total) by mouth daily. 30 capsule 11   vitamin B-12 (CYANOCOBALAMIN ) 1000 MCG tablet Take 1,000 mcg by mouth every morning.     cephALEXin  (KEFLEX ) 500 MG capsule Take 1 capsule (500 mg total) by mouth 2 (two) times daily. (Patient not taking: Reported on 03/17/2024) 14 capsule 0   colchicine 0.6 MG tablet Take 2 tablets (1.2mg ) by mouth at first sign of gout flare followed by 1 tablet (0.6mg ) after 1 hour. (Max 1.8mg  within 1 hour) (Patient not taking: Reported on 03/17/2024)     fenofibrate  (TRICOR ) 145 MG tablet Take 145 mg by mouth 2 (two) times a week. On Wednesday and Sunday (Patient not taking: Reported on 03/17/2024)     oxyCODONE  (OXY IR/ROXICODONE ) 5 MG immediate release tablet Take 1 tablet (5 mg total) by mouth every 6 (six) hours as needed for severe pain (pain score 7-10) or breakthrough pain. (Patient not taking: Reported on 03/17/2024) 10 tablet 0    prochlorperazine  (COMPAZINE ) 10 MG tablet Take 1 tablet (10 mg total) by mouth every 6 (six) hours as needed for nausea or vomiting. (Patient not taking: Reported on 03/17/2024) 30 tablet 1   triamcinolone  ointment (KENALOG ) 0.5 % Apply 1 Application topically 2 (two) times daily. (Patient not taking: Reported on 03/17/2024) 30 g 0   No current facility-administered medications for this visit.   Facility-Administered Medications Ordered in Other Visits  Medication Dose Route Frequency Provider Last Rate Last Admin   0.9 %  sodium chloride  infusion   Intravenous Continuous Babara Call, MD 10 mL/hr at 02/04/24 0921 New Bag at 02/04/24 0921   heparin  lock flush 100 unit/mL  500 Units Intracatheter Once PRN Babara Call, MD        Review of Systems  Constitutional:  Negative for appetite change, chills, fatigue, fever and unexpected weight change.  HENT:   Negative for hearing loss and voice change.   Eyes:  Negative for eye problems and icterus.  Respiratory:  Negative for chest tightness, cough and shortness of breath.   Cardiovascular:  Negative for chest pain and leg swelling.  Gastrointestinal:  Negative for abdominal distention and abdominal pain.       Bloating  Endocrine: Negative for hot flashes.  Genitourinary:  Negative for difficulty urinating, dysuria and frequency.   Musculoskeletal:  Negative for arthralgias.  Skin:  Negative for itching and rash.  Neurological:  Positive for numbness. Negative for light-headedness.  Hematological:  Negative for adenopathy. Does not bruise/bleed easily.  Psychiatric/Behavioral:  Negative for confusion.      PHYSICAL EXAMINATION: ECOG PERFORMANCE STATUS: 1 - Symptomatic but completely ambulatory  Vitals:   03/17/24 0905  BP: (!) 143/66  Pulse: 69  Resp: 18  Temp: (!) 97 F (36.1 C)  SpO2: 96%   Filed Weights   03/17/24 0905  Weight: 156 lb 14.4  oz (71.2 kg)    Physical Exam Constitutional:      General: He is not in acute distress.     Appearance: He is not diaphoretic.  HENT:     Head: Normocephalic and atraumatic.     Nose: Nose normal.  Eyes:     General: No scleral icterus. Cardiovascular:     Rate and Rhythm: Normal rate and regular rhythm.     Heart sounds: No murmur heard. Pulmonary:     Effort: Pulmonary effort is normal. No respiratory distress.  Abdominal:     General: There is no distension.     Palpations: Abdomen is soft. There is no mass.     Tenderness: There is no abdominal tenderness.  Musculoskeletal:        General: Normal range of motion.     Cervical back: Normal range of motion and neck supple.  Skin:    General: Skin is warm and dry.     Findings: No erythema.  Neurological:     Mental Status: He is alert and oriented to person, place, and time. Mental status is at baseline.     Cranial Nerves: No cranial nerve deficit.     Motor: No abnormal muscle tone.  Psychiatric:        Mood and Affect: Mood and affect normal.      LABORATORY DATA:  I have reviewed the data as listed    Latest Ref Rng & Units 03/17/2024    8:15 AM 02/25/2024    7:59 AM 02/04/2024    8:15 AM  CBC  WBC 4.0 - 10.5 K/uL 5.6  7.6  11.5   Hemoglobin 13.0 - 17.0 g/dL 87.2  87.3  86.7   Hematocrit 39.0 - 52.0 % 37.3  37.3  38.4   Platelets 150 - 400 K/uL 228  253  328       Latest Ref Rng & Units 03/17/2024    8:15 AM 02/25/2024    7:59 AM 02/04/2024    8:15 AM  CMP  Glucose 70 - 99 mg/dL 894  888  99   BUN 8 - 23 mg/dL 15  20  23    Creatinine 0.61 - 1.24 mg/dL 9.24  9.19  9.15   Sodium 135 - 145 mmol/L 134  135  133   Potassium 3.5 - 5.1 mmol/L 4.0  3.6  4.7   Chloride 98 - 111 mmol/L 101  100  98   CO2 22 - 32 mmol/L 25  26  26    Calcium 8.9 - 10.3 mg/dL 9.0  9.1  9.0   Total Protein 6.5 - 8.1 g/dL 6.4  6.6  6.2   Total Bilirubin 0.0 - 1.2 mg/dL 0.7  0.8  1.0   Alkaline Phos 38 - 126 U/L 102  125  156   AST 15 - 41 U/L 24  28  25    ALT 0 - 44 U/L 18  20  26       RADIOGRAPHIC STUDIES: I have  personally reviewed the radiological images as listed and agreed with the findings in the report. CT CHEST ABDOMEN PELVIS W CONTRAST Result Date: 02/21/2024 CLINICAL DATA:  Lung cancer restaging * Tracking Code: BO * EXAM: CT CHEST, ABDOMEN, AND PELVIS WITH CONTRAST TECHNIQUE: Multidetector CT imaging of the chest, abdomen and pelvis was performed following the standard protocol during bolus administration of intravenous contrast. RADIATION DOSE REDUCTION: This exam was performed according to the departmental dose-optimization program which includes automated exposure control,  adjustment of the mA and/or kV according to patient size and/or use of iterative reconstruction technique. CONTRAST:  OMNIPAQUE  IOHEXOL  300 MG/ML  SOLN COMPARISON:  PET-CT, 12/04/2023, CT abdomen pelvis, 11/22/2023 FINDINGS: CT CHEST FINDINGS Cardiovascular: Aortic atherosclerosis. Normal heart size. Three-vessel coronary artery calcifications. No pericardial effusion. Mediastinum/Nodes: No persistently enlarged mediastinal or left hilar lymph nodes. Frothy debris in the trachea (series 4, image 63). Heterogeneous multinodular thyroid , requiring no specific further follow-up or characterization in the setting of known metastatic malignancy. Esophagus demonstrates no significant findings. Lungs/Pleura: Moderate centrilobular emphysema. Diffuse bilateral bronchial wall thickening. Nearly complete resolution of previously seen dependent left lower lobe masses and nodularity, with a bandlike residual measuring 2.1 x 1.1 cm (series 4, image 90). Additional small pulmonary nodules are unchanged, for example a 0.3 cm nodule in the central right upper lobe (series 4, image 75) and a 0.4 cm nodule in the right lower lobe (series 4, image 85). No pleural effusion or pneumothorax. Musculoskeletal: No chest wall abnormality. No acute osseous findings. CT ABDOMEN PELVIS FINDINGS Hepatobiliary: Significantly diminished size of numerous hypodense  liver lesions, index lesion in inferior hepatic segment III measuring 2.0 x 1.8 cm, previously 4.3 x 3.8 cm (series 2, image 63). Index lesion in hepatic segment VI/7 measures 2.4 x 2.2 cm, previously 4.1 x 3.9 cm (series 2, image 67). No gallstones, gallbladder wall thickening, or biliary dilatation. Pancreas: Unremarkable. No pancreatic ductal dilatation or surrounding inflammatory changes. Spleen: Normal in size without significant abnormality. Adrenals/Urinary Tract: Adrenal glands are unremarkable. Kidneys are normal, without renal calculi, solid lesion, or hydronephrosis. Bladder is unremarkable. Stomach/Bowel: Stomach is within normal limits. Appendix appears normal. No evidence of bowel wall thickening, distention, or inflammatory changes. Vascular/Lymphatic: Aortic atherosclerosis. No enlarged abdominal or pelvic lymph nodes. Reproductive: Prostatomegaly. Other: Large left inguinal hernia containing nonobstructed sigmoid colon (series 2, image 118). No ascites. Musculoskeletal: No acute osseous findings. Disc degenerative disease and bridging osteophytosis throughout the thoracic and upper lumbar spine, in addition to ankylosis of the sacroiliac joints, in keeping with advanced DISH. Very little CT correlate of previously seen FDG avid osseous metastatic disease; subtle lucency of the T5 vertebral body at the site of a previously FDG avid metastasis (series 2, image 22). Other lesions not clearly appreciated by CT. IMPRESSION: 1. Nearly complete resolution of previously seen dependent left lower lobe masses and nodularity. Additional small nonspecific pulmonary nodules unchanged. 2. No persistently enlarged mediastinal or left hilar lymph nodes. 3. Significantly diminished size of numerous hypodense liver lesions. 4. Constellation of findings is consistent with treatment response of lung malignancy and associated metastatic disease. 5. Very little CT correlate of previously seen FDG avid osseous metastatic  disease; subtle lucency of the T5 vertebral body at the site of a previously FDG avid metastasis. Other lesions not clearly appreciated by CT. Nuclear scintigraphic bone scan or repeat PET-CT may be helpful to assess for residual metabolically active osseous metastatic disease. 6. Emphysema and diffuse bilateral bronchial wall thickening. 7. Coronary artery disease. 8. Large left inguinal hernia containing nonobstructed sigmoid colon. Aortic Atherosclerosis (ICD10-I70.0) and Emphysema (ICD10-J43.9). Electronically Signed   By: Marolyn JONETTA Jaksch M.D.   On: 02/21/2024 21:54

## 2024-03-19 ENCOUNTER — Inpatient Hospital Stay

## 2024-03-19 ENCOUNTER — Ambulatory Visit

## 2024-03-19 ENCOUNTER — Encounter

## 2024-03-19 ENCOUNTER — Other Ambulatory Visit: Payer: Self-pay | Admitting: *Deleted

## 2024-03-19 DIAGNOSIS — C3492 Malignant neoplasm of unspecified part of left bronchus or lung: Secondary | ICD-10-CM

## 2024-03-19 MED ORDER — GABAPENTIN 100 MG PO CAPS: 100.0000 mg | ORAL_CAPSULE | Freq: Two times a day (BID) | ORAL | 2 refills | Status: DC

## 2024-03-19 NOTE — Progress Notes (Signed)
 Daily Session Note  Patient Details  Name: Tony Mitchell MRN: [id number] Date of Birth: 02-23-1941 Referring Provider:   [name] from 02/27/2024 in [facility] Cardiac and Pulmonary Rehab  Referring Provider Babara Call, MD    Encounter Date: 03/19/2024  Check In:  Session Check In - 03/19/24 1335       Check-In   Supervising physician immediately available to respond to emergencies See telemetry face sheet for immediately available ER MD    Location [facility]-Cardiac & Pulmonary Rehab    Staff Present Rollene Paterson, MS, Exercise Physiologist    Virtual Visit No    Medication changes reported     No    Fall or balance concerns reported    No    Warm-up and Cool-down Performed on first and last piece of equipment    Resistance Training Performed Yes    VAD Patient? No    PAD/SET Patient? No      Pain Assessment   Currently in Pain? No/denies    Multiple Pain Sites No             Social History   Tobacco Use  Smoking Status Former   Current packs/day: 0.00   Average packs/day: 1 pack/day for 40.0 years (40.0 ttl pk-yrs)   Types: Cigarettes   Start date: 06/15/1956   Quit date: 06/15/1996   Years since quitting: 27.7   Passive exposure: Past  Smokeless Tobacco Never    Goals Met:  Independence with exercise equipment Exercise tolerated well No report of concerns or symptoms today  Goals Unmet:  Not Applicable  Comments: Pt able to follow exercise prescription today without complaint.  Will continue to monitor for progression.    Dr. Oneil Pinal is Medical Director for [facility] Cardiac Rehabilitation.  Dr. Fuad Aleskerov is Medical Director for [facility] Pulmonary Rehabilitation.

## 2024-03-20 ENCOUNTER — Other Ambulatory Visit (HOSPITAL_COMMUNITY)

## 2024-03-20 ENCOUNTER — Ambulatory Visit
Admission: RE | Admit: 2024-03-20 | Discharge: 2024-03-20 | Disposition: A | Discharge status: Home / Self Care | Source: Ambulatory Visit | Attending: Internal Medicine | Admitting: Internal Medicine

## 2024-03-20 DIAGNOSIS — C7931 Secondary malignant neoplasm of brain: Secondary | ICD-10-CM | POA: Diagnosis present

## 2024-03-20 MED ORDER — GADOBUTROL 1 MMOL/ML IV SOLN
7.0000 mL | Freq: Once | INTRAVENOUS | Status: AC | PRN
  Administered 2024-03-20: 7 mL via INTRAVENOUS

## 2024-03-24 ENCOUNTER — Encounter

## 2024-03-26 ENCOUNTER — Encounter: Payer: Self-pay | Admitting: Oncology

## 2024-03-26 ENCOUNTER — Encounter: Attending: Hospice and Palliative Medicine

## 2024-03-26 DIAGNOSIS — C3492 Malignant neoplasm of unspecified part of left bronchus or lung: Secondary | ICD-10-CM | POA: Insufficient documentation

## 2024-03-26 NOTE — Progress Notes (Signed)
 Daily Session Note  Patient Details  Name: Tony Mitchell MRN: [id number] Date of Birth: [birth year] Referring Provider:   [name] Exercise from 02/27/2024 in [facility] Cardiac and Pulmonary Rehab  Referring Provider Babara Call, MD    Encounter Date: 03/26/2024  Check In:  Session Check In - 03/26/24 1221       Check-In   Supervising physician immediately available to respond to emergencies See telemetry face sheet for immediately available ER MD    Location [facility]-Cardiac & Pulmonary Rehab    Staff Present Rollene Paterson, MS, Exercise Physiologist;Maxon Conetta BS, Exercise Physiologist    Virtual Visit No    Medication changes reported     Yes    Comments Gabapentin  600mg /daily    Fall or balance concerns reported    No    Warm-up and Cool-down Performed on first and last piece of equipment    Resistance Training Performed Yes    VAD Patient? No    PAD/SET Patient? No      Pain Assessment   Currently in Pain? No/denies    Multiple Pain Sites No             Social History   Tobacco Use  Smoking Status Former   Current packs/day: 0.00   Average packs/day: 1 pack/day for 40.0 years (40.0 ttl pk-yrs)   Types: Cigarettes   Start date: 06/15/1956   Quit date: 06/15/1996   Years since quitting: 27.7   Passive exposure: Past  Smokeless Tobacco Never    Goals Met:  Independence with exercise equipment Exercise tolerated well No report of concerns or symptoms today  Goals Unmet:  Not Applicable  Comments: Pt able to follow exercise prescription today without complaint.  Will continue to monitor for progression.    Dr. Oneil Pinal is Medical Director for [facility] Cardiac Rehabilitation.  Dr. Fuad Aleskerov is Medical Director for [facility] Pulmonary Rehabilitation.

## 2024-03-30 ENCOUNTER — Telehealth: Payer: Self-pay | Admitting: *Deleted

## 2024-03-30 NOTE — Telephone Encounter (Signed)
 Per Dr. Babara, ok to take OTC alpha-lipoic acid. Pt informed. Ana has faxed Acupuncture referral to [facility] chiropractic. Pt states he has already been contacted.

## 2024-03-30 NOTE — Telephone Encounter (Signed)
Acupuncture approval faxed to [place]

## 2024-03-30 NOTE — Telephone Encounter (Signed)
 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 It was recommended by his neurologist and he wanted to make sure it was okay to take while he is receiving keytruda . Also, pt wanted to follow up on his referral to acupuncture. He has not heard anything regarding that referral and didn't know if he needed to contact their clinic to set up an appt.   Please advise.

## 2024-03-31 ENCOUNTER — Encounter

## 2024-03-31 ENCOUNTER — Telehealth: Payer: Self-pay | Admitting: Oncology

## 2024-03-31 DIAGNOSIS — C3492 Malignant neoplasm of unspecified part of left bronchus or lung: Secondary | ICD-10-CM

## 2024-03-31 NOTE — Progress Notes (Signed)
 Daily Session Note  Patient Details  Name: DALESSANDRO BALDYGA MRN: [id number] Date of Birth: [birth year] Referring Provider:   [name] & Exercise from 02/27/2024 in [facility] Cardiac and Pulmonary Rehab  Referring Provider Babara Call, MD    Encounter Date: 03/31/2024  Check In:  Session Check In - 03/31/24 1220       Check-In   Supervising physician immediately available to respond to emergencies See telemetry face sheet for immediately available ER MD    Location [facility]-Cardiac & Pulmonary Rehab    Staff Present Rollene Paterson, MS, Exercise Physiologist;Noah Tickle, BS, Exercise Physiologist    Virtual Visit No    Medication changes reported     No    Fall or balance concerns reported    No    Warm-up and Cool-down Performed on first and last piece of equipment    Resistance Training Performed Yes    VAD Patient? No    PAD/SET Patient? No      Pain Assessment   Currently in Pain? No/denies    Multiple Pain Sites No             Social History   Tobacco Use  Smoking Status Former   Current packs/day: 0.00   Average packs/day: 1 pack/day for 40.0 years (40.0 ttl pk-yrs)   Types: Cigarettes   Start date: 06/15/1956   Quit date: 06/15/1996   Years since quitting: 27.8   Passive exposure: Past  Smokeless Tobacco Never    Goals Met:  Independence with exercise equipment Exercise tolerated well No report of concerns or symptoms today  Goals Unmet:  Not Applicable  Comments: Pt able to follow exercise prescription today without complaint.  Will continue to monitor for progression.    Dr. Oneil Pinal is Medical Director for [facility] Cardiac Rehabilitation.  Dr. Fuad Aleskerov is Medical Director for [facility] Pulmonary Rehabilitation.

## 2024-03-31 NOTE — Telephone Encounter (Signed)
 MD not here in morning on 04/07/24. MD okay to move appt to 8/20 using tolerance.  Apt moved to 04/08/2024. Left vm for pt about this update. Also sending mychart msg

## 2024-03-31 NOTE — Progress Notes (Signed)
 Daily Session Note  Patient Details  Name: Tony Mitchell MRN: [id number] Date of Birth: 09/18/1940 Referring Provider:   [name] & Exercise from 02/27/2024 in [facility] Cardiac and Pulmonary Rehab  Referring Provider Babara Call, MD    Encounter Date: 03/31/2024  Check In:  Session Check In - 03/31/24 1220       Check-In   Supervising physician immediately available to respond to emergencies See telemetry face sheet for immediately available ER MD    Location [facility]-Cardiac & Pulmonary Rehab    Staff Present Rollene Paterson, MS, Exercise Physiologist;Noah Tickle, BS, Exercise Physiologist    Virtual Visit No    Medication changes reported     No    Fall or balance concerns reported    No    Warm-up and Cool-down Performed on first and last piece of equipment    Resistance Training Performed Yes    VAD Patient? No    PAD/SET Patient? No      Pain Assessment   Currently in Pain? No/denies    Multiple Pain Sites No             Social History   Tobacco Use  Smoking Status Former   Current packs/day: 0.00   Average packs/day: 1 pack/day for 40.0 years (40.0 ttl pk-yrs)   Types: Cigarettes   Start date: 06/15/1956   Quit date: 06/15/1996   Years since quitting: 27.8   Passive exposure: Past  Smokeless Tobacco Never    Goals Met:  Independence with exercise equipment Exercise tolerated well No report of concerns or symptoms today  Goals Unmet:  Not Applicable  Comments: Pt able to follow exercise prescription today without complaint.  Will continue to monitor for progression.    Dr. Oneil Pinal is Medical Director for [facility] Cardiac Rehabilitation.  Dr. Fuad Aleskerov is Medical Director for [facility] Pulmonary Rehabilitation.

## 2024-04-02 ENCOUNTER — Encounter

## 2024-04-03 ENCOUNTER — Inpatient Hospital Stay: Attending: Oncology | Admitting: Internal Medicine

## 2024-04-03 ENCOUNTER — Encounter: Payer: Self-pay | Admitting: Internal Medicine

## 2024-04-03 VITALS — BP 119/52 | HR 71 | Temp 98.6°F | Resp 20 | Wt 161.5 lb

## 2024-04-03 DIAGNOSIS — M109 Gout, unspecified: Secondary | ICD-10-CM | POA: Insufficient documentation

## 2024-04-03 DIAGNOSIS — Z87891 Personal history of nicotine dependence: Secondary | ICD-10-CM | POA: Insufficient documentation

## 2024-04-03 DIAGNOSIS — Z79899 Other long term (current) drug therapy: Secondary | ICD-10-CM | POA: Insufficient documentation

## 2024-04-03 DIAGNOSIS — C7951 Secondary malignant neoplasm of bone: Secondary | ICD-10-CM | POA: Insufficient documentation

## 2024-04-03 DIAGNOSIS — K219 Gastro-esophageal reflux disease without esophagitis: Secondary | ICD-10-CM | POA: Insufficient documentation

## 2024-04-03 DIAGNOSIS — I129 Hypertensive chronic kidney disease with stage 1 through stage 4 chronic kidney disease, or unspecified chronic kidney disease: Secondary | ICD-10-CM | POA: Insufficient documentation

## 2024-04-03 DIAGNOSIS — N1832 Chronic kidney disease, stage 3b: Secondary | ICD-10-CM | POA: Diagnosis not present

## 2024-04-03 DIAGNOSIS — E1122 Type 2 diabetes mellitus with diabetic chronic kidney disease: Secondary | ICD-10-CM | POA: Insufficient documentation

## 2024-04-03 DIAGNOSIS — Z85828 Personal history of other malignant neoplasm of skin: Secondary | ICD-10-CM | POA: Diagnosis not present

## 2024-04-03 DIAGNOSIS — I2510 Atherosclerotic heart disease of native coronary artery without angina pectoris: Secondary | ICD-10-CM | POA: Diagnosis not present

## 2024-04-03 DIAGNOSIS — C7931 Secondary malignant neoplasm of brain: Secondary | ICD-10-CM | POA: Diagnosis not present

## 2024-04-03 DIAGNOSIS — E785 Hyperlipidemia, unspecified: Secondary | ICD-10-CM | POA: Diagnosis not present

## 2024-04-03 DIAGNOSIS — C787 Secondary malignant neoplasm of liver and intrahepatic bile duct: Secondary | ICD-10-CM | POA: Insufficient documentation

## 2024-04-03 DIAGNOSIS — I6782 Cerebral ischemia: Secondary | ICD-10-CM | POA: Insufficient documentation

## 2024-04-03 DIAGNOSIS — Z5111 Encounter for antineoplastic chemotherapy: Secondary | ICD-10-CM | POA: Diagnosis present

## 2024-04-03 DIAGNOSIS — C3492 Malignant neoplasm of unspecified part of left bronchus or lung: Secondary | ICD-10-CM | POA: Diagnosis not present

## 2024-04-03 DIAGNOSIS — Z5112 Encounter for antineoplastic immunotherapy: Secondary | ICD-10-CM | POA: Insufficient documentation

## 2024-04-03 DIAGNOSIS — M47812 Spondylosis without myelopathy or radiculopathy, cervical region: Secondary | ICD-10-CM | POA: Diagnosis not present

## 2024-04-03 DIAGNOSIS — J449 Chronic obstructive pulmonary disease, unspecified: Secondary | ICD-10-CM | POA: Insufficient documentation

## 2024-04-03 NOTE — Progress Notes (Signed)
 [facility] at [facility] [address]  [place], [place] [address] [phone]   Interval Evaluation  Date of Service: 04/03/24 Patient Name: Tony Mitchell Patient MRN: [id number] Patient DOB: 01-10-1941 Provider: Arthea MARLA Manns, MD  Identifying Statement:  Tony Mitchell is a 83 y.o. male with Metastasis to brain [facility])    Primary Cancer:  Oncologic History: Oncology History  Squamous cell carcinoma lung, left (HCC)  11/22/2023 Imaging   CT abdomen pelvis w contrast  1. Multiple hepatic metastatic lesions. Correlation with history of known malignancy recommended. 2. Serosal implant along the distal stomach versus gastrohepatic adenopathy. 3. Thickened appearance of the gastric antrum and pylorus likely related to underdistention. An infiltrative mass is less likely but not excluded. Endoscopy may provide better evaluation. 4. Herniation of a segment of the sigmoid colon into the left inguinal canal. No bowel obstruction.     12/04/2023 Imaging   PET scan showed  Extensive hypermetabolic neoplastic disease including left lower lobe infiltrative mass extending from the pleura to the hilum with numerous abnormal left-sided hilar and mediastinal nodes.   Additional upper retroperitoneal nodes as well as extensive liver metastases.   Multifocal osseous metastatic disease including the thoracic and lumbar spine, pelvis, right humeral head and left scapula.   Based on the overall appearance and with the patient's laboratory a abnormality this very well could be a primary left lower lobe lung lesion such as small cell versus a neuroendocrine tumor with diffuse metastatic disease.   12/10/2023 Imaging   MRI brain w wo contrast  1. 10 x 9 x 10 mm rounded lesion in the posterior left frontal lobe with surrounding vasogenic edema is consistent with metastatic disease. 2. No other pathologic enhancement is present. 3. Periventricular and  scattered subcortical T2 hyperintensities bilaterally are mildly advanced for age. The finding is nonspecific but can be seen in the setting of chronic microvascular ischemia, a demyelinating process such as multiple sclerosis, vasculitis, complicated migraine headaches, or as the sequelae of a prior infectious or inflammatory process.   12/17/2023 Initial Diagnosis   Squamous cell carcinoma lung, left (HCC)  He began experiencing abdominal pain for a week.  Initially presenting as increased gas and stomach growling despite having eaten. The pain was localized between the rib cage and the abdomen and described as intermittent.  Later the pain had resolved, but he continues to feel bloated. He has CT abdomen pelvis done which showed liver masses.   Liver biopsy showed  1. Liver, biopsy, right hepatic lesion :      - METASTATIC POORLY DIFFERENTIATED SQUAMOUS CELL CARCINOMA WITH NECROSIS.  Diagnosis Note : Per [facility] a recent PET scan revealed a hypermetabolic left lower lobe lung mass with extensive metastatic lesions. The carcinoma is positive for cytokeratin 7, p40 (greater than 50%), and CD56 (subset). Cytokeratin 20, TTF-1,  chromogranin, synaptophysin, and CDX-2 are negative. In the absence of  chromogranin / synaptophysin staining the presence of patchy CD56 staining is interpreted as non-specific. The pattern of immunoreactivity is consistent with metastatic poorly differentiated squamous cell carcinoma.     12/17/2023 Cancer Staging   Staging form: Lung, AJCC V9 - Clinical stage from 12/17/2023: [name], pM1c - Signed by Babara Call, MD on 12/17/2023 Histopathologic type: Squamous cell carcinoma, NOS Stage prefix: Initial diagnosis Laterality: Left Sites of metastasis: Bone, Liver, Brain   12/23/2023 - 12/25/2023 Chemotherapy   Patient is on Treatment Plan : LUNG Carboplatin  + Paclitaxel  q21d  01/14/2024 -  Chemotherapy   Carboplatin  + Paclitaxel  + Pembrolizumab  (200) D1 q21d x 4 cycles /  Pembrolizumab  (200) Maintenance D1 q21d       Imaging   CT chest abdomen pelvis w contrast  1. Nearly complete resolution of previously seen dependent left lower lobe masses and nodularity. Additional small nonspecific pulmonary nodules unchanged. 2. No persistently enlarged mediastinal or left hilar lymph nodes. 3. Significantly diminished size of numerous hypodense liver lesions. 4. Constellation of findings is consistent with treatment response of lung malignancy and associated metastatic disease. 5. Very little CT correlate of previously seen FDG avid osseous metastatic disease; subtle lucency of the T5 vertebral body at the site of a previously FDG avid metastasis. Other lesions not clearly appreciated by CT. Nuclear scintigraphic bone scan or repeat PET-CT may be helpful to assess for residual metabolically active osseous metastatic disease. 6. Emphysema and diffuse bilateral bronchial wall thickening. 7. Coronary artery disease. 8. Large left inguinal hernia containing nonobstructed sigmoid colon.   Aortic Atherosclerosis (ICD10-I70.0) and Emphysema (ICD10-J43.9).   02/18/2024 Imaging   CT chest abdomen pelvis w contrast showed 1. Nearly complete resolution of previously seen dependent left lower lobe masses and nodularity. Additional small nonspecific pulmonary nodules unchanged. 2. No persistently enlarged mediastinal or left hilar lymph nodes. 3. Significantly diminished size of numerous hypodense liver lesions. 4. Constellation of findings is consistent with treatment response of lung malignancy and associated metastatic disease. 5. Very little CT correlate of previously seen FDG avid osseous metastatic disease; subtle lucency of the T5 vertebral body at the site of a previously FDG avid metastasis. Other lesions not clearly appreciated by CT. Nuclear scintigraphic bone scan or repeat PET-CT may be helpful to assess for residual metabolically active  osseous metastatic disease. 6. Emphysema and diffuse bilateral bronchial wall thickening. 7. Coronary artery disease. 8. Large left inguinal hernia containing nonobstructed sigmoid colon.   Aortic Atherosclerosis (ICD10-I70.0) and Emphysema (ICD10-J43.9).    CNS Oncologic History 12/30/23: SRS to R frontal oligometastasis  Interval History: Askari Kinley Lardner presents today for follow up after recent MRI brain.  No new or progressive changes described today.  Denies seizures, headaches.  Now on keytruda  monotherapy with Dr. Babara.  H+P (01/17/24) Patient presents today for evaluation for recent brain metastasis.  Single tumor was initially uncovered during routine staging, he denies any right sided weakness or other neurologic symptoms, aside from fatigue and some short term memory loss.  He ambulates on his own, though limited by right knee pain.  He is otherwise doing well with chemotherapy with Dr. Babara.    Medications: Current Outpatient Medications on File Prior to Visit  Medication Sig Dispense Refill   allopurinol  (ZYLOPRIM ) 100 MG tablet Take 100 mg by mouth in the morning and at bedtime.     ALPRAZolam  (XANAX ) 0.5 MG tablet Take 0.5 mg by mouth at bedtime.     amLODipine  (NORVASC ) 2.5 MG tablet Take 2.5 mg by mouth in the morning.     cephALEXin  (KEFLEX ) 500 MG capsule Take 1 capsule (500 mg total) by mouth 2 (two) times daily. (Patient not taking: Reported on 03/17/2024) 14 capsule 0   colchicine 0.6 MG tablet Take 2 tablets (1.2mg ) by mouth at first sign of gout flare followed by 1 tablet (0.6mg ) after 1 hour. (Max 1.8mg  within 1 hour) (Patient not taking: Reported on 03/17/2024)     fenofibrate  (TRICOR ) 145 MG tablet Take 145 mg by mouth 2 (two) times a week. On Wednesday and Sunday (  Patient not taking: Reported on 03/17/2024)     finasteride  (PROSCAR ) 5 MG tablet Take 1 tablet (5 mg total) by mouth daily. 90 tablet 3   furosemide  (LASIX ) 20 MG tablet Take 1 tablet (20 mg total) by  mouth daily as needed. 30 tablet 0   gabapentin  (NEURONTIN ) 100 MG capsule Take 1 capsule (100 mg total) by mouth 2 (two) times daily. 60 capsule 2   hydrALAZINE  (APRESOLINE ) 25 MG tablet Take 25 mg by mouth 2 (two) times daily.     lansoprazole (PREVACID) 15 MG capsule Take 15 mg by mouth every evening.     oxyCODONE  (OXY IR/ROXICODONE ) 5 MG immediate release tablet Take 1 tablet (5 mg total) by mouth every 6 (six) hours as needed for severe pain (pain score 7-10) or breakthrough pain. (Patient not taking: Reported on 03/17/2024) 10 tablet 0   prochlorperazine  (COMPAZINE ) 10 MG tablet Take 1 tablet (10 mg total) by mouth every 6 (six) hours as needed for nausea or vomiting. (Patient not taking: Reported on 03/17/2024) 30 tablet 1   simvastatin  (ZOCOR ) 20 MG tablet Take 1 tablet by mouth 2 (two) times a week. On  Wednesday and Sunday     tamsulosin  (FLOMAX ) 0.4 MG CAPS capsule Take 1 capsule (0.4 mg total) by mouth daily. 30 capsule 11   triamcinolone  ointment (KENALOG ) 0.5 % Apply 1 Application topically 2 (two) times daily. (Patient not taking: Reported on 03/17/2024) 30 g 0   vitamin B-12 (CYANOCOBALAMIN ) 1000 MCG tablet Take 1,000 mcg by mouth every morning.     Current Facility-Administered Medications on File Prior to Visit  Medication Dose Route Frequency Provider Last Rate Last Admin   0.9 %  sodium chloride  infusion   Intravenous Continuous Babara Call, MD 10 mL/hr at 02/04/24 0921 New Bag at 02/04/24 9078   heparin  lock flush 100 unit/mL  500 Units Intracatheter Once PRN Babara Call, MD        Allergies: No Known Allergies Past Medical History:  Past Medical History:  Diagnosis Date   Acute appendicitis 03/03/2018   Arthritis    Cancer (HCC)    skin cancer   Chronic kidney disease, stage 3b (HCC)    Collapsed lung 2019   x2   COPD (chronic obstructive pulmonary disease) (HCC)    Coronary artery disease    Diabetes mellitus without complication (HCC)    diet controlled-lost 60 lbs and  is not on any current meds   GERD (gastroesophageal reflux disease)    Gout    Hyperlipidemia    Hypertension    Sleep apnea    uses cpap   Thoracic aortic aneurysm (HCC)    Vitamin B 12 deficiency    Past Surgical History:  Past Surgical History:  Procedure Laterality Date   CARDIAC CATHETERIZATION     CATARACT EXTRACTION  2012   CHEST TUBE INSERTION     x2   COLONOSCOPY WITH PROPOFOL  N/A 04/14/2021   Procedure: COLONOSCOPY WITH PROPOFOL ;  Surgeon: Maryruth Ole DASEN, MD;  Location: [facility];  Service: Endoscopy;  Laterality: N/A;   ESOPHAGOGASTRODUODENOSCOPY (EGD) WITH PROPOFOL  N/A 04/14/2021   Procedure: ESOPHAGOGASTRODUODENOSCOPY (EGD) WITH PROPOFOL ;  Surgeon: Maryruth Ole DASEN, MD;  Location: [facility];  Service: Endoscopy;  Laterality: N/A;   INSERTION OF MESH  09/22/2021   Procedure: INSERTION OF MESH;  Surgeon: Tye Millet, DO;  Location: [facility] OR[facility];  Service: General;;   JOINT REPLACEMENT     KNEE SURGERY Left 1965   LAPAROSCOPIC APPENDECTOMY N/A 03/03/2018  Procedure: APPENDECTOMY LAPAROSCOPIC;  Surgeon: Nicholaus Selinda Birmingham, MD;  Location: [facility] OR[facility];  Service: General;  Laterality: N/A;   TOTAL KNEE ARTHROPLASTY Left 09/16/2018   Procedure: TOTAL KNEE ARTHROPLASTY;  Surgeon: Edie Norleen PARAS, MD;  Location: [facility] OR[facility];  Service: Orthopedics;  Laterality: Left;   Social History:  Social History   Socioeconomic History   Marital status: Married    Spouse name: Not on file   Number of children: Not on file   Years of education: Not on file   Highest education level: Not on file  Occupational History   Not on file  Tobacco Use   Smoking status: Former    Current packs/day: 0.00    Average packs/day: 1 pack/day for 40.0 years (40.0 ttl pk-yrs)    Types: Cigarettes    Start date: 06/15/1956    Quit date: 06/15/1996    Years since quitting: 27.8    Passive exposure: Past   Smokeless tobacco: Never  Vaping Use   Vaping status: Never Used  Substance and  Sexual Activity   Alcohol use: Not Currently    Alcohol/week: 21.0 standard drinks of alcohol    Types: 21 Shots of liquor per week    Comment: reports last drink 11/03/20   Drug use: No   Sexual activity: Yes  Other Topics Concern   Not on file  Social History Narrative   Not on file   Social Drivers of Health   Financial Resource Strain: Low Risk  (10/30/2023)   [facility]   Overall Financial Resource Strain (CARDIA)    Difficulty of Paying Living Expenses: Not hard at all  Food Insecurity: No Food Insecurity (10/30/2023)   Received from [facility]   Hunger Vital Sign    Within the past 12 months, you worried that your food would run out before you got the money to buy more.: Never true    Within the past 12 months, the food you bought just didn't last and you didn't have money to get more.: Never true  Transportation Needs: No Transportation Needs (10/30/2023)   Received from [facility] - Transportation    In the past 12 months, has lack of transportation kept you from medical appointments or from getting medications?: No    Lack of Transportation (Non-Medical): No  Physical Activity: Sufficiently Active (07/10/2017)   Received from [facility]   Exercise Vital Sign    Days of Exercise per Week: 5 days    Minutes of Exercise per Session: 60 min  Stress: Stress Concern Present (07/10/2017)   Received from [facility]ational [facility] - Occupational Stress Questionnaire    Feeling of Stress : Rather much  Social Connections: Unknown (07/10/2017)   Received from [facility]   Social Connection and Isolation Panel    Frequency of Communication with Friends and Family: Patient declined    Frequency of Social Gatherings with Friends and Family: Patient declined    Attends Religious Services: Patient declined    Automotive engineer or Organizations: Patient declined    Attends Banker Meetings: Patient declined    Marital Status: Patient declined  Catering manager Violence: Not on file   Family History:  Family History  Problem Relation Age of Onset   Cancer Father        Unknown   Prostate cancer Father  Lung cancer Neg Hx     Review of Systems: Constitutional: Doesn't report fevers, chills or abnormal weight loss Eyes: Doesn't report blurriness of vision Ears, nose, mouth, throat, and face: Doesn't report sore throat Respiratory: Doesn't report cough, dyspnea or wheezes Cardiovascular: Doesn't report palpitation, chest discomfort  Gastrointestinal:  Doesn't report nausea, constipation, diarrhea GU: Doesn't report incontinence Skin: Doesn't report skin rashes Neurological: Per HPI Musculoskeletal: Doesn't report joint pain Behavioral/Psych: Doesn't report anxiety  Physical Exam: There were no vitals filed for this visit.  KPS: 80. General: Alert, cooperative, pleasant, in no acute distress Head: Normal EENT: No conjunctival injection or scleral icterus.  Lungs: Resp effort normal Cardiac: Regular rate Abdomen: Non-distended abdomen Skin: No rashes cyanosis or petechiae. Extremities: No clubbing or edema  Neurologic Exam: Mental Status: Awake, alert, attentive to examiner. Oriented to self and environment. Language is fluent with intact comprehension.  Cranial Nerves: Visual acuity is grossly normal. Visual fields are full. Extra-ocular movements intact. No ptosis. Face is symmetric Motor: Tone and bulk are normal. Power is full in both arms and legs. Reflexes are symmetric, no pathologic reflexes present.  Sensory: Intact to light touch Gait: Normal.   Labs: I have reviewed the data as listed    Component Value Date/Time   NA 134 (L) 03/17/2024 0815   NA 140 05/27/2014 1712   K 4.0 03/17/2024 0815   K 3.8 05/27/2014 1712   CL 101 03/17/2024 0815   CL 105  05/27/2014 1712   CO2 25 03/17/2024 0815   CO2 28 05/27/2014 1712   GLUCOSE 105 (H) 03/17/2024 0815   GLUCOSE 120 (H) 05/27/2014 1712   BUN 15 03/17/2024 0815   BUN 11 05/27/2014 1712   CREATININE 0.75 03/17/2024 0815   CREATININE 1.02 05/27/2014 1712   CALCIUM 9.0 03/17/2024 0815   CALCIUM 8.2 (L) 05/27/2014 1712   PROT 6.4 (L) 03/17/2024 0815   ALBUMIN 4.1 03/17/2024 0815   AST 24 03/17/2024 0815   ALT 18 03/17/2024 0815   ALKPHOS 102 03/17/2024 0815   BILITOT 0.7 03/17/2024 0815   GFRNONAA >60 03/17/2024 0815   GFRNONAA >60 05/27/2014 1712   GFRAA >60 09/18/2018 0338   GFRAA >60 05/27/2014 1712   Lab Results  Component Value Date   WBC 5.6 03/17/2024   NEUTROABS 3.0 03/17/2024   HGB 12.7 (L) 03/17/2024   HCT 37.3 (L) 03/17/2024   MCV 92.6 03/17/2024   PLT 228 03/17/2024    Imaging:  MR BRAIN W WO CONTRAST Result Date: 03/20/2024 CLINICAL DATA:  Provided history: Metastasis to brain. Brain/CNS neoplasm, assess treatment response. Additional history obtained from electronic MEDICAL RECORD NUMBERmetastatic squamous cell carcinoma status post SRS. EXAM: MRI HEAD WITHOUT AND WITH CONTRAST TECHNIQUE: Multiplanar, multiecho pulse sequences of the brain and surrounding structures were obtained without and with intravenous contrast. CONTRAST:  7mL GADAVIST  GADOBUTROL  1 MMOL/ML IV SOLN COMPARISON:  Prior brain MRI examinations 12/18/2023 and earlier. FINDINGS: Brain: Mild generalized cerebral atrophy. The peripherally enhancing metastasis previously demonstrated within the posterior left frontal lobe is no longer appreciated. No new sites of intracranial metastatic disease are identified. Multifocal T2 FLAIR hyperintense signal abnormality within the cerebral white matter, nonspecific but compatible with mild chronic small vessel ischemic disease. There is no acute infarct. No chronic intracranial blood products. No extra-axial fluid collection. No midline shift. Vascular: Maintained flow voids  within the proximal large arterial vessels. Skull and upper cervical spine: No focal worrisome marrow lesion. Incompletely assessed cervical spondylosis. Sinuses/Orbits: No mass or  acute finding within the imaged orbits. Prior bilateral ocular lens replacement. No significant paranasal sinus disease. IMPRESSION: 1. The peripherally enhancing metastasis previously demonstrated within the posterior left frontal lobe is no longer appreciated. 2. No new sites of intracranial metastatic disease. 3. Mild chronic small vessel ischemic changes within the cerebral white matter. 4. Mild generalized cerebral atrophy. Electronically Signed   By: Rockey Childs D.O.   On: 03/20/2024 13:52     Assessment/Plan Metastasis to brain [facility])  Norleen PARAS Shimkus is clinically stable today, now 3 months removed from [facility] for squamous cell lung oligometastasis.  MRI demonstrates stable findings, with no detectable tumor on either T1-post or FLAIR sequences.   Mild neuropathic changes persist, likely from chemotherapy.  We encouraged exercise, brain games, healthy diet.  Recommended continuing imaging surveillance at this time without steroids.  He is agreeable with this.  We appreciate the opportunity to participate in the care of Neev J Predmore.   We ask that Kevonte J Daoud return to clinic in 6 months following next brain MRI, or sooner as needed.  All questions were answered. The patient knows to call the clinic with any problems, questions or concerns. No barriers to learning were detected.  The total time spent in the encounter was 30 minutes and more than 50% was on counseling and review of test results   Arthea MARLA Manns, MD Medical Director of Neuro-Oncology [facility] at [facility] 04/03/24 10:44 AM

## 2024-04-06 ENCOUNTER — Encounter: Payer: Self-pay | Admitting: Oncology

## 2024-04-07 ENCOUNTER — Encounter

## 2024-04-07 ENCOUNTER — Inpatient Hospital Stay

## 2024-04-07 ENCOUNTER — Inpatient Hospital Stay: Admitting: Oncology

## 2024-04-07 DIAGNOSIS — C3492 Malignant neoplasm of unspecified part of left bronchus or lung: Secondary | ICD-10-CM

## 2024-04-07 NOTE — Progress Notes (Signed)
 Daily Session Note  Patient Details  Name: Tony Mitchell MRN: [id number] Date of Birth: January 24, 1941 Referring Provider:   [name] Cancer Associated Rehabilitation & Exercise from 02/27/2024 in [facility] Pul[facility] Rehab  Referring Provider Babara Call, MD    Encounter Date: 04/07/2024  Check In:  Session Check In - 04/07/24 1221       Check-In   Supervising physician immediately available to respond to emergencies See telemetry face sheet for immediately available ER MD    Location [facility]-Cardiac & Pulmonary Rehab    Staff Present Devaughn Jaeger, BS, Exercise Physiologist    Virtual Visit No    Medication changes reported     No    Fall or balance concerns reported    No    Warm-up and Cool-down Performed on first and last piece of equipment    Resistance Training Performed Yes    VAD Patient? No    PAD/SET Patient? No      Pain Assessment   Currently in Pain? No/denies    Multiple Pain Sites No             Social History   Tobacco Use  Smoking Status Former   Current packs/day: 0.00   Average packs/day: 1 pack/day for 40.0 years (40.0 ttl pk-yrs)   Types: Cigarettes   Start date: 06/15/1956   Quit date: 06/15/1996   Years since quitting: 27.8   Passive exposure: Past  Smokeless Tobacco Never    Goals Met:  Independence with exercise equipment Exercise tolerated well No report of concerns or symptoms today  Goals Unmet:  Not Applicable  Comments: Pt able to follow exercise prescription today without complaint.  Will continue to monitor for progression.    Dr. Oneil Pinal is Medical Director for [facility] Cardiac Rehabilitation.  Dr. Fuad Aleskerov is Medical Director for [facility] Pulmonary Rehabilitation.

## 2024-04-08 ENCOUNTER — Inpatient Hospital Stay (HOSPITAL_BASED_OUTPATIENT_CLINIC_OR_DEPARTMENT_OTHER): Admitting: Oncology

## 2024-04-08 ENCOUNTER — Inpatient Hospital Stay

## 2024-04-08 ENCOUNTER — Encounter: Payer: Self-pay | Admitting: Oncology

## 2024-04-08 VITALS — BP 146/60 | HR 62 | Temp 97.8°F | Resp 18 | Ht 67.0 in | Wt 161.0 lb

## 2024-04-08 DIAGNOSIS — C7931 Secondary malignant neoplasm of brain: Secondary | ICD-10-CM | POA: Diagnosis not present

## 2024-04-08 DIAGNOSIS — T451X5A Adverse effect of antineoplastic and immunosuppressive drugs, initial encounter: Secondary | ICD-10-CM

## 2024-04-08 DIAGNOSIS — G620 Drug-induced polyneuropathy: Secondary | ICD-10-CM

## 2024-04-08 DIAGNOSIS — C3492 Malignant neoplasm of unspecified part of left bronchus or lung: Secondary | ICD-10-CM

## 2024-04-08 DIAGNOSIS — Z5112 Encounter for antineoplastic immunotherapy: Secondary | ICD-10-CM

## 2024-04-08 LAB — CBC WITH DIFFERENTIAL (CANCER CENTER ONLY)
Abs Immature Granulocytes: 0.01 K/uL (ref 0.00–0.07)
Basophils Absolute: 0.1 K/uL (ref 0.0–0.1)
Basophils Relative: 1 %
Eosinophils Absolute: 0.1 K/uL (ref 0.0–0.5)
Eosinophils Relative: 2 %
HCT: 38.7 % — ABNORMAL LOW (ref 39.0–52.0)
Hemoglobin: 12.7 g/dL — ABNORMAL LOW (ref 13.0–17.0)
Immature Granulocytes: 0 %
Lymphocytes Relative: 41 %
Lymphs Abs: 1.6 K/uL (ref 0.7–4.0)
MCH: 31.4 pg (ref 26.0–34.0)
MCHC: 32.8 g/dL (ref 30.0–36.0)
MCV: 95.6 fL (ref 80.0–100.0)
Monocytes Absolute: 0.4 K/uL (ref 0.1–1.0)
Monocytes Relative: 10 %
Neutro Abs: 1.7 K/uL (ref 1.7–7.7)
Neutrophils Relative %: 46 %
Platelet Count: 194 K/uL (ref 150–400)
RBC: 4.05 MIL/uL — ABNORMAL LOW (ref 4.22–5.81)
RDW: 13.4 % (ref 11.5–15.5)
WBC Count: 3.8 K/uL — ABNORMAL LOW (ref 4.0–10.5)
nRBC: 0 % (ref 0.0–0.2)

## 2024-04-08 LAB — CMP (CANCER CENTER ONLY)
ALT: 17 U/L (ref 0–44)
AST: 23 U/L (ref 15–41)
Albumin: 3.9 g/dL (ref 3.5–5.0)
Alkaline Phosphatase: 77 U/L (ref 38–126)
Anion gap: 7 (ref 5–15)
BUN: 13 mg/dL (ref 8–23)
CO2: 27 mmol/L (ref 22–32)
Calcium: 9.1 mg/dL (ref 8.9–10.3)
Chloride: 104 mmol/L (ref 98–111)
Creatinine: 0.89 mg/dL (ref 0.61–1.24)
GFR, Estimated: >60 mL/min (ref >60)
Glucose, Bld: 114 mg/dL — ABNORMAL HIGH (ref 70–99)
Potassium: 4 mmol/L (ref 3.5–5.1)
Sodium: 138 mmol/L (ref 135–145)
Total Bilirubin: 0.7 mg/dL (ref 0.0–1.2)
Total Protein: 6.2 g/dL — ABNORMAL LOW (ref 6.5–8.1)

## 2024-04-08 MED ORDER — SODIUM CHLORIDE 0.9 % IV SOLN
INTRAVENOUS | Status: DC
  Filled 2024-04-08: qty 250

## 2024-04-08 MED ORDER — SODIUM CHLORIDE 0.9 % IV SOLN
200.0000 mg | Freq: Once | INTRAVENOUS | Status: AC
  Administered 2024-04-08: 200 mg via INTRAVENOUS
  Filled 2024-04-08: qty 8

## 2024-04-08 NOTE — Assessment & Plan Note (Signed)
 Gabapentin  was recently increased to 2-3 time daily.  Follow up with neurology- Referred to acupuncture clinic

## 2024-04-08 NOTE — Assessment & Plan Note (Addendum)
 S/p Brain Radiation. Repeat brain MRI showed treatment response . He was seen by neurology Dr. Buckley.

## 2024-04-08 NOTE — Patient Instructions (Signed)
 [facility] - A DEPT OF [facility]  Discharge Instructions: Thank you for choosing [facility] to provide your oncology and hematology care.  If you have a lab appointment with the Cancer Center, please go directly to the Cancer Center and check in at the registration area.  Wear comfortable clothing and clothing appropriate for easy access to any Portacath or PICC line.   We strive to give you quality time with your provider. You may need to reschedule your appointment if you arrive late (15 or more minutes).  Arriving late affects you and other patients whose appointments are after yours.  Also, if you miss three or more appointments without notifying the office, you may be dismissed from the clinic at the provider's discretion.      For prescription refill requests, have your pharmacy contact our office and allow 72 hours for refills to be completed.    Today you received the following chemotherapy and/or immunotherapy agents Rande Lawman      To help prevent nausea and vomiting after your treatment, we encourage you to take your nausea medication as directed.  BELOW ARE SYMPTOMS THAT SHOULD BE REPORTED IMMEDIATELY: *FEVER GREATER THAN 100.4 F (38 C) OR HIGHER *CHILLS OR SWEATING *NAUSEA AND VOMITING THAT IS NOT CONTROLLED WITH YOUR NAUSEA MEDICATION *UNUSUAL SHORTNESS OF BREATH *UNUSUAL BRUISING OR BLEEDING *URINARY PROBLEMS (pain or burning when urinating, or frequent urination) *BOWEL PROBLEMS (unusual diarrhea, constipation, pain near the anus) TENDERNESS IN MOUTH AND THROAT WITH OR WITHOUT PRESENCE OF ULCERS (sore throat, sores in mouth, or a toothache) UNUSUAL RASH, SWELLING OR PAIN  UNUSUAL VAGINAL DISCHARGE OR ITCHING   Items with * indicate a potential emergency and should be followed up as soon as possible or go to the Emergency Department if any problems should occur.  Please show the CHEMOTHERAPY ALERT CARD or IMMUNOTHERAPY  ALERT CARD at check-in to the Emergency Department and triage nurse.  Should you have questions after your visit or need to cancel or reschedule your appointment, please contact [facility] - A DEPT OF [facility]  [phone] and follow the prompts.  Office hours are 8:00 a.m. to 4:30 p.m. Monday - Friday. Please note that voicemails left after 4:00 p.m. may not be returned until the following business day.  We are closed weekends and major holidays. You have access to a nurse at all times for urgent questions. Please call the main number to the clinic [phone] and follow the prompts.  For any non-urgent questions, you may also contact your provider using MyChart. We now offer e-Visits for anyone 48 and older to request care online for non-urgent symptoms. For details visit [email or web address].   Also download the MyChart app! Go to the app store, search "MyChart", open the app, select [facility], and log in with your MyChart username and password.

## 2024-04-08 NOTE — Assessment & Plan Note (Addendum)
 Imaging results and pathology results were reviewed and discussed with patient. 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 NGS showed TMB 53.2 high, PD-L1 <1%, SMARCB1- high TMB, Keytruda  was added on cycle 2 CT scan after 3 cycles showed partial response.  Status post 4 cycles of carboplatin , Taxol  and 3 doses of Keytruda . Labs are reviewed and discussed with patient.  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 Patient has high TMB. hold off carboplatin  and Taxol , continue Keytruda  for maintenance.   Plan to repeat CT in late Sept/early Oct

## 2024-04-08 NOTE — Assessment & Plan Note (Signed)
 Keytruda  treatments as planned

## 2024-04-08 NOTE — Progress Notes (Signed)
 Patient is doing well, no new questions for the doctor today. He did have a MRI on 03/20/2024. He told me that over the weekend he felt like he pulled a muscle in near his right side of his pectoral muscle, which he does exercise, but it isn't bothering him anymore.

## 2024-04-08 NOTE — Progress Notes (Signed)
 Hematology/Oncology Progress note [phone] [phone]     CHIEF COMPLAINTS/PURPOSE OF CONSULTATION:  Metastatic squamous cell carcinoma  ASSESSMENT & PLAN:   Cancer Staging  Squamous cell carcinoma lung, left (HCC) Staging form: Lung, AJCC V9 - Clinical stage from 12/17/2023: Tony Mitchell, pM1c - Signed by Babara Call, MD on 12/17/2023   Squamous cell carcinoma lung, left [facility]) Imaging results and pathology results were reviewed and discussed with patient. 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 NGS showed TMB 53.2 high, PD-L1 <1%, SMARCB1- high TMB, Keytruda  was added on cycle 2 CT scan after 3 cycles showed partial response.  Status post 4 cycles of carboplatin , Taxol  and 3 doses of Keytruda . Labs are reviewed and discussed with patient.  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 Patient has high TMB. hold off carboplatin  and Taxol , continue Keytruda  for maintenance.   Plan to repeat CT in late Sept/early Oct    Chemotherapy-induced neuropathy (HCC) Gabapentin  was recently increased to 2-3 time daily.  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 Repeat brain MRI showed treatment response . He was seen by neurology Dr. Buckley.    Orders Placed This Encounter  Procedures   CT CHEST ABDOMEN PELVIS W CONTRAST    Standing Status:   Future    Expected Date:   05/27/2024    Expiration Date:   04/08/2025    If indicated for the ordered procedure, I authorize the administration of contrast media per Radiology protocol:   Yes    Does the patient have a contrast media/X-ray dye allergy?:   No    Preferred imaging location?:   [facility]    If indicated for the ordered procedure, I authorize the administration of oral contrast media per Radiology  protocol:   Yes   CBC with Differential (Cancer Center Only)    Standing Status:   Future    Expected Date:   05/05/2024    Expiration Date:   05/05/2025   CMP (Cancer Center only)    Standing Status:   Future    Expected Date:   05/05/2024    Expiration Date:   05/05/2025   CBC with Differential (Cancer Center Only)    Standing Status:   Future    Expected Date:   05/26/2024    Expiration Date:   05/26/2025   CMP (Cancer Center only)    Standing Status:   Future    Expected Date:   05/26/2024    Expiration Date:   05/26/2025   T4    Standing Status:   Future    Expected Date:   05/26/2024    Expiration Date:   05/26/2025   TSH    Standing Status:   Future    Expected Date:   05/26/2024    Expiration Date:   05/26/2025   Follow up  3 weeks  All questions were answered. The patient knows to call the clinic with any problems, questions or concerns.  Call Babara, MD, PhD [facility] Hematology Oncology 04/08/2024    HISTORY OF PRESENTING ILLNESS:  Tony Mitchell 83 y.o. male presents for follow up of stage IV lung SCC  Oncology History  Squamous cell carcinoma lung, left (HCC)  11/22/2023 Imaging   CT abdomen pelvis w contrast  1. Multiple hepatic metastatic lesions. Correlation with history of known malignancy recommended.  2. Serosal implant along the distal stomach versus gastrohepatic adenopathy. 3. Thickened appearance of the gastric antrum and pylorus likely related to underdistention. An infiltrative mass is less likely but not excluded. Endoscopy may provide better evaluation. 4. Herniation of a segment of the sigmoid colon into the left inguinal canal. No bowel obstruction.     12/04/2023 Imaging   PET scan showed  Extensive hypermetabolic neoplastic disease including left lower lobe infiltrative mass extending from the pleura to the hilum with numerous abnormal left-sided hilar and mediastinal nodes.   Additional upper retroperitoneal nodes as well as extensive  liver metastases.   Multifocal osseous metastatic disease including the thoracic and lumbar spine, pelvis, right humeral head and left scapula.   Based on the overall appearance and with the patient's laboratory a abnormality this very well could be a primary left lower lobe lung lesion such as small cell versus a neuroendocrine tumor with diffuse metastatic disease.   12/10/2023 Imaging   MRI brain w wo contrast  1. 10 x 9 x 10 mm rounded lesion in the posterior left frontal lobe with surrounding vasogenic edema is consistent with metastatic disease. 2. No other pathologic enhancement is present. 3. Periventricular and scattered subcortical T2 hyperintensities bilaterally are mildly advanced for age. The finding is nonspecific but can be seen in the setting of chronic microvascular ischemia, a demyelinating process such as multiple sclerosis, vasculitis, complicated migraine headaches, or as the sequelae of a prior infectious or inflammatory process.   12/17/2023 Initial Diagnosis   Squamous cell carcinoma lung, left (HCC)  He began experiencing abdominal pain for a week.  Initially presenting as increased gas and stomach growling despite having eaten. The pain was localized between the rib cage and the abdomen and described as intermittent.  Later the pain had resolved, but he continues to feel bloated. He has CT abdomen pelvis done which showed liver masses.   Liver biopsy showed  1. Liver, biopsy, right hepatic lesion :      - METASTATIC POORLY DIFFERENTIATED SQUAMOUS CELL CARCINOMA WITH NECROSIS.  Diagnosis Note : Per [facility] a recent PET scan revealed a hypermetabolic left lower lobe lung mass with extensive metastatic lesions. The carcinoma is positive for cytokeratin 7, p40 (greater than 50%), and CD56 (subset). Cytokeratin 20, TTF-1,  chromogranin, synaptophysin, and CDX-2 are negative. In the absence of  chromogranin / synaptophysin staining the presence of patchy CD56 staining  is interpreted as non-specific. The pattern of immunoreactivity is consistent with metastatic poorly differentiated squamous cell carcinoma.     12/17/2023 Cancer Staging   Staging form: Lung, AJCC V9 - Clinical stage from 12/17/2023: Tony Mitchell, pM1c - Signed by Babara Call, MD on 12/17/2023 Histopathologic type: Squamous cell carcinoma, NOS Stage prefix: Initial diagnosis Laterality: Left Sites of metastasis: Bone, Liver, Brain   12/23/2023 - 12/25/2023 Chemotherapy   Patient is on Treatment Plan : LUNG Carboplatin  + Paclitaxel  q21d     01/14/2024 -  Chemotherapy   Carboplatin  + Paclitaxel  + Pembrolizumab  (200) D1 q21d x 4 cycles / Pembrolizumab  (200) Maintenance D1 q21d       Imaging   CT chest abdomen pelvis w contrast  1. Nearly complete resolution of previously seen dependent left lower lobe masses and nodularity. Additional small nonspecific pulmonary nodules unchanged. 2. No persistently enlarged mediastinal or left hilar lymph nodes. 3. Significantly diminished size of numerous hypodense liver lesions. 4. Constellation of findings is consistent with treatment response of lung malignancy and associated metastatic disease. 5. Very little CT  correlate of previously seen FDG avid osseous metastatic disease; subtle lucency of the T5 vertebral body at the site of a previously FDG avid metastasis. Other lesions not clearly appreciated by CT. Nuclear scintigraphic bone scan or repeat PET-CT may be helpful to assess for residual metabolically active osseous metastatic disease. 6. Emphysema and diffuse bilateral bronchial wall thickening. 7. Coronary artery disease. 8. Large left inguinal hernia containing nonobstructed sigmoid colon.   Aortic Atherosclerosis (ICD10-I70.0) and Emphysema (ICD10-J43.9).   02/18/2024 Imaging   CT chest abdomen pelvis w contrast showed 1. Nearly complete resolution of previously seen dependent left lower lobe masses and nodularity. Additional small  nonspecific pulmonary nodules unchanged. 2. No persistently enlarged mediastinal or left hilar lymph nodes. 3. Significantly diminished size of numerous hypodense liver lesions. 4. Constellation of findings is consistent with treatment response of lung malignancy and associated metastatic disease. 5. Very little CT correlate of previously seen FDG avid osseous metastatic disease; subtle lucency of the T5 vertebral body at the site of a previously FDG avid metastasis. Other lesions not clearly appreciated by CT. Nuclear scintigraphic bone scan or repeat PET-CT may be helpful to assess for residual metabolically active osseous metastatic disease. 6. Emphysema and diffuse bilateral bronchial wall thickening. 7. Coronary artery disease. 8. Large left inguinal hernia containing nonobstructed sigmoid colon.   Aortic Atherosclerosis (ICD10-I70.0) and Emphysema (ICD10-J43.9).     Patient denies changes in bowel habits.  Denies unintentional weight loss night sweats or fever. No history of blood transfusions or hepatitis. He is not on any blood thinners but takes aspirin  for preventive purposes.  He has a history of stage 3 renal disease diagnosed three years ago after a blood test revealed elevated potassium levels. Since then, he has made significant lifestyle changes, including stopping alcohol consumption and losing weight. He now maintains a weight between 145 and 151 pounds and walks approximately 5.6 miles daily.  He has a history of skin cancer, treated with resection, and is scheduled for further treatment. His father had prostate cancer.  Patient had a normal PSA in January 2025.   Patient lives at home with wife who has multiple medical problems.  He is the caregiver for her. His daughter passed away last year.  He has granddaughter who lives in [place] .  INTERVAL HISTORY FERDINANDO LODGE is a 83 y.o. male who has above history reviewed by me today presents for follow up visit for  treatment of metastatic squamous cell lung cancer.   S/p brain radiation he finished tapering course of steroid.   +  pin and needling sensation on his fingertips, significantly worse since cycle 4 chemotherapy.  He takes gabapentin  100 mg 2-3 times a day. Seen by Dr. Buckley. Repeated MRI brain Feeling more fatigued after recent chemotherapy.   Weight has been stable. + acid reflux symptoms, stomach discomfort after eating spanish food. He takes PPI  MEDICAL HISTORY:  Past Medical History:  Diagnosis Date   Acute appendicitis 03/03/2018   Arthritis    Cancer (HCC)    skin cancer   Chronic kidney disease, stage 3b (HCC)    Collapsed lung 2019   x2   COPD (chronic obstructive pulmonary disease) (HCC)    Coronary artery disease    Diabetes mellitus without complication (HCC)    diet controlled-lost 60 lbs and is not on any current meds   GERD (gastroesophageal reflux disease)    Gout    Hyperlipidemia    Hypertension    Sleep apnea  uses cpap   Thoracic aortic aneurysm (HCC)    Vitamin B 12 deficiency     SURGICAL HISTORY: Past Surgical History:  Procedure Laterality Date   CARDIAC CATHETERIZATION     CATARACT EXTRACTION  2012   CHEST TUBE INSERTION     x2   COLONOSCOPY WITH PROPOFOL  N/A 04/14/2021   Procedure: COLONOSCOPY WITH PROPOFOL ;  Surgeon: Maryruth Ole DASEN, MD;  Location: [facility];  Service: Endoscopy;  Laterality: N/A;   ESOPHAGOGASTRODUODENOSCOPY (EGD) WITH PROPOFOL  N/A 04/14/2021   Procedure: ESOPHAGOGASTRODUODENOSCOPY (EGD) WITH PROPOFOL ;  Surgeon: Maryruth Ole DASEN, MD;  Location: [facility] END[facility]COPY;  Service: Endoscopy;  Laterality: N/A;   INSERTION OF MESH  09/22/2021   Procedure: INSERTION OF MESH;  Surgeon: Tye Millet, DO;  Location: [facility] ORS;  Service: General;;   JOINT REPLACEMENT     KNEE SURGERY Left 1965   LAPAROSCOPIC APPENDECTOMY N/A 03/03/2018   Procedure: APPENDECTOMY LAPAROSCOPIC;  Surgeon: Nicholaus Selinda Birmingham, MD;  Location: [facility] ORS;   Service: General;  Laterality: N/A;   TOTAL KNEE ARTHROPLASTY Left 09/16/2018   Procedure: TOTAL KNEE ARTHROPLASTY;  Surgeon: Edie Norleen PARAS, MD;  Location: [facility] ORS;  Service: Orthopedics;  Laterality: Left;    SOCIAL HISTORY: Social History   Socioeconomic History   Marital status: Married    Spouse name: Not on file   Number of children: Not on file   Years of education: Not on file   Highest education level: Not on file  Occupational History   Not on file  Tobacco Use   Smoking status: Former    Current packs/day: 0.00    Average packs/day: 1 pack/day for 40.0 years (40.0 ttl pk-yrs)    Types: Cigarettes    Start date: 06/15/1956    Quit date: 06/15/1996    Years since quitting: 27.8    Passive exposure: Past   Smokeless tobacco: Never  Vaping Use   Vaping status: Never Used  Substance and Sexual Activity   Alcohol use: Not Currently    Alcohol/week: 21.0 standard drinks of alcohol    Types: 21 Shots of liquor per week    Comment: reports last drink 11/03/20   Drug use: No   Sexual activity: Yes  Other Topics Concern   Not on file  Social History Narrative   Not on file   Social Drivers of Health   Financial Resource Strain: Low Risk  (10/30/2023)   Received from [facility]   Overall Financial Resource Strain (CARDIA)    Difficulty of Paying Living Expenses: Not hard at all  Food Insecurity: No Food Insecurity (10/30/2023)   Received from [facility]   Hunger Vital Sign    Within the past 12 months, you worried that your food would run out before you got the money to buy more.: Never true    Within the past 12 months, the food you bought just didn't last and you didn't have money to get more.: Never true  Transportation Needs: No Transportation Needs (10/30/2023)   Received from [facility] - Transportation    In the past 12 months, has lack of transportation kept you from medical appointments or  from getting medications?: No    Lack of Transportation (Non-Medical): No  Physical Activity: Sufficiently Active (07/10/2017)   Received from [facility]   Exercise Vital Sign    Days of Exercise per Week: 5 days    Minutes of Exercise per Session: 60 min  Stress: Stress Concern Present (07/10/2017)   Received from [facility] - Occupational Stress Questionnaire    Feeling of Stress : Rather much  Social Connections: Unknown (07/10/2017)   Received from [facility]   Social Connection and Isolation Panel    Frequency of Communication with Friends and Family: Patient declined    Frequency of Social Gatherings with Friends and Family: Patient declined    Attends Religious Services: Patient declined    Database administrator or Organizations: Patient declined    Attends Engineer, structural: Patient declined    Marital Status: Patient declined  Catering manager Violence: Not on file    FAMILY HISTORY: Family History  Problem Relation Age of Onset   Cancer Father        Unknown   Prostate cancer Father    Lung cancer Neg Hx     ALLERGIES:  has no known allergies.  MEDICATIONS:  Current Outpatient Medications  Medication Sig Dispense Refill   allopurinol  (ZYLOPRIM ) 100 MG tablet Take 100 mg by mouth in the morning and at bedtime.     ALPRAZolam  (XANAX ) 0.5 MG tablet Take 0.5 mg by mouth at bedtime.     amLODipine  (NORVASC ) 2.5 MG tablet Take 2.5 mg by mouth in the morning.     cephALEXin  (KEFLEX ) 500 MG capsule Take 1 capsule (500 mg total) by mouth 2 (two) times daily. 14 capsule 0   colchicine 0.6 MG tablet Take 2 tablets (1.2mg ) by mouth at first sign of gout flare followed by 1 tablet (0.6mg ) after 1 hour. (Max 1.8mg  within 1 hour)     fenofibrate  (TRICOR ) 145 MG tablet Take 145 mg by mouth 2 (two) times a week. On Wednesday and Sunday     finasteride  (PROSCAR ) 5 MG  tablet Take 1 tablet (5 mg total) by mouth daily. 90 tablet 3   furosemide  (LASIX ) 20 MG tablet Take 1 tablet (20 mg total) by mouth daily as needed. 30 tablet 0   hydrALAZINE  (APRESOLINE ) 25 MG tablet Take 25 mg by mouth 2 (two) times daily.     lansoprazole (PREVACID) 15 MG capsule Take 15 mg by mouth every evening.     oxyCODONE  (OXY IR/ROXICODONE ) 5 MG immediate release tablet Take 1 tablet (5 mg total) by mouth every 6 (six) hours as needed for severe pain (pain score 7-10) or breakthrough pain. 10 tablet 0   prochlorperazine  (COMPAZINE ) 10 MG tablet Take 1 tablet (10 mg total) by mouth every 6 (six) hours as needed for nausea or vomiting. 30 tablet 1   simvastatin  (ZOCOR ) 20 MG tablet Take 1 tablet by mouth 2 (two) times a week. On  Wednesday and Sunday     tamsulosin  (FLOMAX ) 0.4 MG CAPS capsule Take 1 capsule (0.4 mg total) by mouth daily. 30 capsule 11   triamcinolone  ointment (KENALOG ) 0.5 % Apply 1 Application topically 2 (two) times daily. 30 g 0   vitamin B-12 (CYANOCOBALAMIN ) 1000 MCG tablet Take 1,000 mcg by mouth every morning.     gabapentin  (NEURONTIN ) 100 MG capsule Take 1 capsule (100 mg total) by mouth 2 (two) times daily. (Patient taking differently: Take 200 mg by mouth 3 (three) times daily.) 60 capsule 2   No current facility-administered medications for this visit.   Facility-Administered Medications Ordered in Other Visits  Medication Dose Route Frequency Provider Last Rate Last Admin   0.9 %  sodium chloride  infusion   Intravenous  Continuous Babara Call, MD 10 mL/hr at 02/04/24 0921 New Bag at 02/04/24 0921   0.9 %  sodium chloride  infusion   Intravenous Continuous Babara Call, MD   Stopped at 04/08/24 1049   heparin  lock flush 100 unit/mL  500 Units Intracatheter Once PRN Babara Call, MD        Review of Systems  Constitutional:  Negative for appetite change, chills, fatigue, fever and unexpected weight change.  HENT:   Negative for hearing loss and voice change.   Eyes:   Negative for eye problems and icterus.  Respiratory:  Negative for chest tightness, cough and shortness of breath.   Cardiovascular:  Negative for chest pain and leg swelling.  Gastrointestinal:  Negative for abdominal distention and abdominal pain.       Bloating  Endocrine: Negative for hot flashes.  Genitourinary:  Negative for difficulty urinating, dysuria and frequency.   Musculoskeletal:  Negative for arthralgias.  Skin:  Negative for itching and rash.  Neurological:  Positive for numbness. Negative for light-headedness.  Hematological:  Negative for adenopathy. Does not bruise/bleed easily.  Psychiatric/Behavioral:  Negative for confusion.      PHYSICAL EXAMINATION: ECOG PERFORMANCE STATUS: 1 - Symptomatic but completely ambulatory  Vitals:   04/08/24 0843 04/08/24 0847  BP: (!) 151/63 (!) 146/60  Pulse: 62   Resp: 18   Temp: 97.8 F (36.6 C)   SpO2: 100%    Filed Weights   04/08/24 0843  Weight: 161 lb (73 kg)    Physical Exam Constitutional:      General: He is not in acute distress.    Appearance: He is not diaphoretic.  HENT:     Head: Normocephalic and atraumatic.     Nose: Nose normal.  Eyes:     General: No scleral icterus. Cardiovascular:     Rate and Rhythm: Normal rate and regular rhythm.     Heart sounds: No murmur heard. Pulmonary:     Effort: Pulmonary effort is normal. No respiratory distress.  Abdominal:     General: There is no distension.     Palpations: Abdomen is soft. There is no mass.     Tenderness: There is no abdominal tenderness.  Musculoskeletal:        General: Normal range of motion.     Cervical back: Normal range of motion and neck supple.  Skin:    General: Skin is warm and dry.     Findings: No erythema.  Neurological:     Mental Status: He is alert and oriented to person, place, and time. Mental status is at baseline.     Cranial Nerves: No cranial nerve deficit.     Motor: No abnormal muscle tone.  Psychiatric:         Mood and Affect: Mood and affect normal.      LABORATORY DATA:  I have reviewed the data as listed    Latest Ref Rng & Units 04/08/2024    8:20 AM 03/17/2024    8:15 AM 02/25/2024    7:59 AM  CBC  WBC 4.0 - 10.5 K/uL 3.8  5.6  7.6   Hemoglobin 13.0 - 17.0 g/dL 87.2  87.2  87.3   Hematocrit 39.0 - 52.0 % 38.7  37.3  37.3   Platelets 150 - 400 K/uL 194  228  253       Latest Ref Rng & Units 04/08/2024    8:20 AM 03/17/2024    8:15 AM 02/25/2024    7:59 AM  CMP  Glucose 70 - 99 mg/dL 885  894  888   BUN 8 - 23 mg/dL 13  15  20    Creatinine 0.61 - 1.24 mg/dL 9.10  9.24  9.19   Sodium 135 - 145 mmol/L 138  134  135   Potassium 3.5 - 5.1 mmol/L 4.0  4.0  3.6   Chloride 98 - 111 mmol/L 104  101  100   CO2 22 - 32 mmol/L 27  25  26    Calcium 8.9 - 10.3 mg/dL 9.1  9.0  9.1   Total Protein 6.5 - 8.1 g/dL 6.2  6.4  6.6   Total Bilirubin 0.0 - 1.2 mg/dL 0.7  0.7  0.8   Alkaline Phos 38 - 126 U/L 77  102  125   AST 15 - 41 U/L 23  24  28    ALT 0 - 44 U/L 17  18  20       RADIOGRAPHIC STUDIES: I have personally reviewed the radiological images as listed and agreed with the findings in the report. MR BRAIN W WO CONTRAST Result Date: 03/20/2024 CLINICAL DATA:  Provided history: Metastasis to brain. Brain/CNS neoplasm, assess treatment response. Additional history obtained from electronic MEDICAL RECORD NUMBERmetastatic squamous cell carcinoma status post SRS. EXAM: MRI HEAD WITHOUT AND WITH CONTRAST TECHNIQUE: Multiplanar, multiecho pulse sequences of the brain and surrounding structures were obtained without and with intravenous contrast. CONTRAST:  7mL GADAVIST  GADOBUTROL  1 MMOL/ML IV SOLN COMPARISON:  Prior brain MRI examinations 12/18/2023 and earlier. FINDINGS: Brain: Mild generalized cerebral atrophy. The peripherally enhancing metastasis previously demonstrated within the posterior left frontal lobe is no longer appreciated. No new sites of intracranial metastatic disease are identified.  Multifocal T2 FLAIR hyperintense signal abnormality within the cerebral white matter, nonspecific but compatible with mild chronic small vessel ischemic disease. There is no acute infarct. No chronic intracranial blood products. No extra-axial fluid collection. No midline shift. Vascular: Maintained flow voids within the proximal large arterial vessels. Skull and upper cervical spine: No focal worrisome marrow lesion. Incompletely assessed cervical spondylosis. Sinuses/Orbits: No mass or acute finding within the imaged orbits. Prior bilateral ocular lens replacement. No significant paranasal sinus disease. IMPRESSION: 1. The peripherally enhancing metastasis previously demonstrated within the posterior left frontal lobe is no longer appreciated. 2. No new sites of intracranial metastatic disease. 3. Mild chronic small vessel ischemic changes within the cerebral white matter. 4. Mild generalized cerebral atrophy. Electronically Signed   By: Rockey Childs D.O.   On: 03/20/2024 13:52

## 2024-04-09 ENCOUNTER — Encounter: Admitting: *Deleted

## 2024-04-09 DIAGNOSIS — C3492 Malignant neoplasm of unspecified part of left bronchus or lung: Secondary | ICD-10-CM

## 2024-04-09 NOTE — Progress Notes (Signed)
 Daily Session Note  Patient Details  Name: LEVAR FAYSON MRN: [id number] Date of Birth: [birth year] Referring Provider:   [name] Exercise from 02/27/2024 in [facility]  Referring Provider Babara Call, MD    Encounter Date: 04/09/2024  Check In:  Session Check In - 04/09/24 1245       Check-In   Supervising physician immediately available to respond to emergencies See telemetry face sheet for immediately available ER MD    Location [facility]-Cardiac & Pulmonary Rehab    Staff Present Maxon Conetta BS, Exercise Physiologist    Virtual Visit No    Medication changes reported     No    Fall or balance concerns reported    No    Warm-up and Cool-down Performed on first and last piece of equipment    Resistance Training Performed Yes    VAD Patient? No    PAD/SET Patient? No      Pain Assessment   Currently in Pain? No/denies             Social History   Tobacco Use  Smoking Status Former   Current packs/day: 0.00   Average packs/day: 1 pack/day for 40.0 years (40.0 ttl pk-yrs)   Types: Cigarettes   Start date: 06/15/1956   Quit date: 06/15/1996   Years since quitting: 27.8   Passive exposure: Past  Smokeless Tobacco Never    Goals Met:  Independence with exercise equipment Exercise tolerated well No report of concerns or symptoms today Strength training completed today  Goals Unmet:  Not Applicable  Comments: Pt able to follow exercise prescription today without complaint.  Will continue to monitor for progression.    Dr. Oneil Pinal is Medical Director for [facility] Cardiac Rehabilitation.  Dr. Fuad Aleskerov is Medical Director for [facility] Pulmonary Rehabilitation.

## 2024-04-14 ENCOUNTER — Encounter

## 2024-04-14 ENCOUNTER — Other Ambulatory Visit: Payer: Self-pay

## 2024-04-14 DIAGNOSIS — C3492 Malignant neoplasm of unspecified part of left bronchus or lung: Secondary | ICD-10-CM

## 2024-04-14 NOTE — Progress Notes (Signed)
 Daily Session Note  Patient Details  Name: AYHAM WORD MRN: [id number] Date of Birth: 17-Nov-1940 Referring Provider:   [name] from 02/27/2024 in [facility] Cardiac and Pulmonary Rehab  Referring Provider Babara Call, MD    Encounter Date: 04/14/2024  Check In:  Session Check In - 04/14/24 1415       Check-In   Supervising physician immediately available to respond to emergencies See telemetry face sheet for immediately available ER MD    Location [facility]-Cardiac & Pulmonary Rehab    Staff Present Rollene Paterson, MS, Exercise Physiologist    Virtual Visit No    Medication changes reported     No    Fall or balance concerns reported    No    Warm-up and Cool-down Performed on first and last piece of equipment    Resistance Training Performed Yes    VAD Patient? No    PAD/SET Patient? No      Pain Assessment   Currently in Pain? No/denies    Multiple Pain Sites No             Social History   Tobacco Use  Smoking Status Former   Current packs/day: 0.00   Average packs/day: 1 pack/day for 40.0 years (40.0 ttl pk-yrs)   Types: Cigarettes   Start date: 06/15/1956   Quit date: 06/15/1996   Years since quitting: 27.8   Passive exposure: Past  Smokeless Tobacco Never    Goals Met:  Independence with exercise equipment Exercise tolerated well No report of concerns or symptoms today  Goals Unmet:  Not Applicable  Comments: Pt able to follow exercise prescription today without complaint.  Will continue to monitor for progression.    Dr. Oneil Pinal is Medical Director for [facility] Cardiac Rehabilitation.  Dr. Fuad Aleskerov is Medical Director for [facility] Pulmonary Rehabilitation.

## 2024-04-15 ENCOUNTER — Encounter: Admitting: Hospice and Palliative Medicine

## 2024-04-16 ENCOUNTER — Encounter

## 2024-04-16 DIAGNOSIS — C3492 Malignant neoplasm of unspecified part of left bronchus or lung: Secondary | ICD-10-CM

## 2024-04-16 NOTE — Progress Notes (Signed)
 Daily Session Note  Patient Details  Name: Tony Mitchell MRN: [id number] Date of Birth: 1941-07-17 Referring Provider:   [name] Exercise from 02/27/2024 in [facility] Cardiac and Pulmonary Rehab  Referring Provider Babara Call, MD    Encounter Date: 04/16/2024  Check In:  Session Check In - 04/16/24 1226       Check-In   Supervising physician immediately available to respond to emergencies See telemetry face sheet for immediately available ER MD    Location [facility]-Cardiac & Pulmonary Rehab    Staff Present Rollene Paterson, MS, Exercise Physiologist    Virtual Visit No    Medication changes reported     No    Fall or balance concerns reported    No    Warm-up and Cool-down Performed on first and last piece of equipment    Resistance Training Performed Yes    VAD Patient? No    PAD/SET Patient? No      Pain Assessment   Currently in Pain? No/denies    Multiple Pain Sites No             Social History   Tobacco Use  Smoking Status Former   Current packs/day: 0.00   Average packs/day: 1 pack/day for 40.0 years (40.0 ttl pk-yrs)   Types: Cigarettes   Start date: 06/15/1956   Quit date: 06/15/1996   Years since quitting: 27.8   Passive exposure: Past  Smokeless Tobacco Never    Goals Met:  Independence with exercise equipment Exercise tolerated well No report of concerns or symptoms today  Goals Unmet:  Not Applicable  Comments: Pt able to follow exercise prescription today without complaint.  Will continue to monitor for progression.    Dr. Oneil Pinal is Medical Director for [facility] Cardiac Rehabilitation.  Dr. Fuad Aleskerov is Medical Director for [facility] Pulmonary Rehabilitation.

## 2024-04-21 ENCOUNTER — Encounter: Attending: Hospice and Palliative Medicine

## 2024-04-21 DIAGNOSIS — C3492 Malignant neoplasm of unspecified part of left bronchus or lung: Secondary | ICD-10-CM | POA: Insufficient documentation

## 2024-04-21 NOTE — Progress Notes (Signed)
 Daily Session Note  Patient Details  Name: Tony Mitchell MRN: [id number] Date of Birth: Jun 24, 1941 Referring Provider:   [name] & Exercise from 02/27/2024 in [facility] Cardiac and Pulmonary Rehab  Referring Provider Babara Call, MD    Encounter Date: 04/21/2024  Check In:  Session Check In - 04/21/24 1226       Check-In   Supervising physician immediately available to respond to emergencies See telemetry face sheet for immediately available ER MD    Location [facility]-Cardiac & Pulmonary Rehab    Staff Present Devaughn Jaeger, BS, Exercise Physiologist    Virtual Visit No    Medication changes reported     No    Fall or balance concerns reported    No    Warm-up and Cool-down Performed on first and last piece of equipment    Resistance Training Performed Yes    VAD Patient? No    PAD/SET Patient? No      Pain Assessment   Currently in Pain? No/denies    Multiple Pain Sites No             Social History   Tobacco Use  Smoking Status Former   Current packs/day: 0.00   Average packs/day: 1 pack/day for 40.0 years (40.0 ttl pk-yrs)   Types: Cigarettes   Start date: 06/15/1956   Quit date: 06/15/1996   Years since quitting: 27.8   Passive exposure: Past  Smokeless Tobacco Never    Goals Met:  Independence with exercise equipment Exercise tolerated well No report of concerns or symptoms today  Goals Unmet:  Not Applicable  Comments: Pt able to follow exercise prescription today without complaint.  Will continue to monitor for progression.    Dr. Oneil Pinal is Medical Director for [facility] Cardiac Rehabilitation.  Dr. Fuad Aleskerov is Medical Director for [facility] Pulmonary Rehabilitation.

## 2024-04-23 ENCOUNTER — Encounter

## 2024-04-23 DIAGNOSIS — C3492 Malignant neoplasm of unspecified part of left bronchus or lung: Secondary | ICD-10-CM

## 2024-04-23 NOTE — Progress Notes (Signed)
 Daily Session Note  Patient Details  Name: Tony Mitchell MRN: [id number] Date of Birth: Apr 04, 1941 Referring Provider:   [name] & Exercise from 02/27/2024 in [facility] Cardiac and Pulmonary Rehab  Referring Provider Babara Call, MD    Encounter Date: 04/23/2024  Check In:  Session Check In - 04/23/24 1250       Check-In   Supervising physician immediately available to respond to emergencies See telemetry face sheet for immediately available ER MD    Location [facility]-Cardiac & Pulmonary Rehab    Staff Present Rollene Paterson, MS, Exercise Physiologist    Virtual Visit No    Medication changes reported     No    Fall or balance concerns reported    No    Warm-up and Cool-down Performed on first and last piece of equipment    Resistance Training Performed Yes    VAD Patient? No    PAD/SET Patient? No      Pain Assessment   Currently in Pain? No/denies    Multiple Pain Sites No            Exercise Prescription Changes - 04/23/24 1200       Home Exercise Plan   Plans to continue exercise at Home (comment)   [name] plans to continue walking outside 7 days a week ( usually 2+ miles). He also states that he is looking into joiing a community fitness center or buying a rowing machine.   Frequency Add 3 additional days to program exercise sessions.    Initial Home Exercises Provided 04/23/24          Social History   Tobacco Use  Smoking Status Former   Current packs/day: 0.00   Average packs/day: 1 pack/day for 40.0 years (40.0 ttl pk-yrs)   Types: Cigarettes   Start date: 06/15/1956   Quit date: 06/15/1996   Years since quitting: 27.8   Passive exposure: Past  Smokeless Tobacco Never    Goals Met:  Independence with exercise equipment Exercise tolerated well No report of concerns or symptoms today  Goals Unmet:  Not Applicable  Comments: Pt able to follow exercise prescription today without complaint.  Will continue to monitor  for progression.    Dr. Oneil Pinal is Medical Director for [facility] Cardiac Rehabilitation.  Dr. Fuad Aleskerov is Medical Director for [facility] Pulmonary Rehabilitation.

## 2024-04-28 ENCOUNTER — Encounter

## 2024-04-28 ENCOUNTER — Telehealth: Payer: Self-pay | Admitting: *Deleted

## 2024-04-28 NOTE — Telephone Encounter (Signed)
 Pt left message requesting to have the flu vaccine at his next appt on 9/15 if available.

## 2024-04-28 NOTE — Telephone Encounter (Signed)
 Checked with pharmacy and they have High dose Flu vaccine available. Please schedule addon Inj on 9/15

## 2024-04-30 ENCOUNTER — Encounter

## 2024-05-04 ENCOUNTER — Inpatient Hospital Stay (HOSPITAL_BASED_OUTPATIENT_CLINIC_OR_DEPARTMENT_OTHER): Admitting: Oncology

## 2024-05-04 ENCOUNTER — Ambulatory Visit

## 2024-05-04 ENCOUNTER — Inpatient Hospital Stay (HOSPITAL_BASED_OUTPATIENT_CLINIC_OR_DEPARTMENT_OTHER): Admitting: Hospice and Palliative Medicine

## 2024-05-04 ENCOUNTER — Encounter: Payer: Self-pay | Admitting: Oncology

## 2024-05-04 ENCOUNTER — Inpatient Hospital Stay

## 2024-05-04 ENCOUNTER — Inpatient Hospital Stay: Attending: Oncology

## 2024-05-04 VITALS — BP 162/78 | HR 66

## 2024-05-04 VITALS — BP 152/69 | HR 64 | Temp 97.0°F | Resp 18 | Wt 163.0 lb

## 2024-05-04 DIAGNOSIS — Z87891 Personal history of nicotine dependence: Secondary | ICD-10-CM | POA: Diagnosis not present

## 2024-05-04 DIAGNOSIS — E538 Deficiency of other specified B group vitamins: Secondary | ICD-10-CM | POA: Diagnosis not present

## 2024-05-04 DIAGNOSIS — Z515 Encounter for palliative care: Secondary | ICD-10-CM

## 2024-05-04 DIAGNOSIS — Z79899 Other long term (current) drug therapy: Secondary | ICD-10-CM | POA: Diagnosis not present

## 2024-05-04 DIAGNOSIS — G620 Drug-induced polyneuropathy: Secondary | ICD-10-CM | POA: Diagnosis not present

## 2024-05-04 DIAGNOSIS — C7931 Secondary malignant neoplasm of brain: Secondary | ICD-10-CM | POA: Diagnosis not present

## 2024-05-04 DIAGNOSIS — Z23 Encounter for immunization: Secondary | ICD-10-CM | POA: Insufficient documentation

## 2024-05-04 DIAGNOSIS — C3492 Malignant neoplasm of unspecified part of left bronchus or lung: Secondary | ICD-10-CM

## 2024-05-04 DIAGNOSIS — Z5112 Encounter for antineoplastic immunotherapy: Secondary | ICD-10-CM

## 2024-05-04 DIAGNOSIS — Z79620 Long term (current) use of immunosuppressive biologic: Secondary | ICD-10-CM | POA: Diagnosis not present

## 2024-05-04 DIAGNOSIS — C787 Secondary malignant neoplasm of liver and intrahepatic bile duct: Secondary | ICD-10-CM | POA: Diagnosis not present

## 2024-05-04 DIAGNOSIS — C7951 Secondary malignant neoplasm of bone: Secondary | ICD-10-CM | POA: Insufficient documentation

## 2024-05-04 DIAGNOSIS — T451X5A Adverse effect of antineoplastic and immunosuppressive drugs, initial encounter: Secondary | ICD-10-CM

## 2024-05-04 LAB — CBC WITH DIFFERENTIAL (CANCER CENTER ONLY)
Abs Immature Granulocytes: 0.02 K/uL (ref 0.00–0.07)
Basophils Absolute: 0 K/uL (ref 0.0–0.1)
Basophils Relative: 1 %
Eosinophils Absolute: 0 K/uL (ref 0.0–0.5)
Eosinophils Relative: 1 %
HCT: 43.6 % (ref 39.0–52.0)
Hemoglobin: 14.2 g/dL (ref 13.0–17.0)
Immature Granulocytes: 0 %
Lymphocytes Relative: 21 %
Lymphs Abs: 1.1 K/uL (ref 0.7–4.0)
MCH: 30.5 pg (ref 26.0–34.0)
MCHC: 32.6 g/dL (ref 30.0–36.0)
MCV: 93.6 fL (ref 80.0–100.0)
Monocytes Absolute: 0.2 K/uL (ref 0.1–1.0)
Monocytes Relative: 3 %
Neutro Abs: 4.1 K/uL (ref 1.7–7.7)
Neutrophils Relative %: 74 %
Platelet Count: 200 K/uL (ref 150–400)
RBC: 4.66 MIL/uL (ref 4.22–5.81)
RDW: 12 % (ref 11.5–15.5)
WBC Count: 5.5 K/uL (ref 4.0–10.5)
nRBC: 0 % (ref 0.0–0.2)

## 2024-05-04 LAB — CMP (CANCER CENTER ONLY)
ALT: 17 U/L (ref 0–44)
AST: 24 U/L (ref 15–41)
Albumin: 4.1 g/dL (ref 3.5–5.0)
Alkaline Phosphatase: 81 U/L (ref 38–126)
Anion gap: 7 (ref 5–15)
BUN: 18 mg/dL (ref 8–23)
CO2: 28 mmol/L (ref 22–32)
Calcium: 9.2 mg/dL (ref 8.9–10.3)
Chloride: 106 mmol/L (ref 98–111)
Creatinine: 0.96 mg/dL (ref 0.61–1.24)
GFR, Estimated: >60 mL/min (ref >60)
Glucose, Bld: 130 mg/dL — ABNORMAL HIGH (ref 70–99)
Potassium: 4.4 mmol/L (ref 3.5–5.1)
Sodium: 141 mmol/L (ref 135–145)
Total Bilirubin: 0.7 mg/dL (ref 0.0–1.2)
Total Protein: 6.6 g/dL (ref 6.5–8.1)

## 2024-05-04 MED ORDER — SODIUM CHLORIDE 0.9 % IV SOLN
INTRAVENOUS | Status: DC
  Filled 2024-05-04: qty 250

## 2024-05-04 MED ORDER — SODIUM CHLORIDE 0.9 % IV SOLN
200.0000 mg | Freq: Once | INTRAVENOUS | Status: AC
  Administered 2024-05-04: 200 mg via INTRAVENOUS
  Filled 2024-05-04: qty 8

## 2024-05-04 MED ORDER — INFLUENZA VAC SPLIT HIGH-DOSE 0.5 ML IM SUSY
0.5000 mL | PREFILLED_SYRINGE | Freq: Once | INTRAMUSCULAR | Status: AC
  Administered 2024-05-04: 0.5 mL via INTRAMUSCULAR
  Filled 2024-05-04: qty 0.5

## 2024-05-04 NOTE — Assessment & Plan Note (Signed)
 S/p Brain Radiation. Repeat brain MRI showed treatment response . He was seen by neurology Dr. Buckley.

## 2024-05-04 NOTE — Progress Notes (Signed)
 Hematology/Oncology Progress note [phone] [phone]     CHIEF COMPLAINTS/PURPOSE OF CONSULTATION:  Metastatic squamous cell carcinoma  ASSESSMENT & PLAN:   Cancer Staging  Squamous cell carcinoma lung, left (HCC) Staging form: Lung, AJCC V9 - Clinical stage from 12/17/2023: Tony Mitchell, pM1c - Signed by Babara Call, MD on 12/17/2023   Squamous cell carcinoma lung, left [facility]) Imaging results and pathology results were reviewed and discussed with patient. 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 NGS showed TMB 53.2 high, PD-L1 <1%, SMARCB1- high TMB, Keytruda  was added on cycle 2 CT scan after 3 cycles showed partial response.  Status post 4 cycles of carboplatin , Taxol  and 3 doses of Keytruda . Labs are reviewed and discussed with patient.  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 Patient has high TMB.  continue Keytruda  for maintenance.   Plan to repeat CT in late Sept/early Oct    Chemotherapy-induced neuropathy (HCC) Gabapentin  was recently increased to 2-3 time daily.  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 Repeat brain MRI showed treatment response . He was seen by neurology Dr. Buckley.    No orders of the defined types were placed in this encounter.  Follow up  3 weeks  All questions were answered. The patient knows to call the clinic with any problems, questions or concerns.  Call Babara, MD, PhD [facility] Hematology Oncology 05/04/2024    HISTORY OF PRESENTING ILLNESS:  Tony Mitchell 83 y.o. male presents for follow up of stage IV lung SCC  Oncology History  Squamous cell carcinoma lung, left (HCC)  11/22/2023 Imaging   CT abdomen pelvis w contrast  1. Multiple hepatic metastatic lesions. Correlation with history of known  malignancy recommended. 2. Serosal implant along the distal stomach versus gastrohepatic adenopathy. 3. Thickened appearance of the gastric antrum and pylorus likely related to underdistention. An infiltrative mass is less likely but not excluded. Endoscopy may provide better evaluation. 4. Herniation of a segment of the sigmoid colon into the left inguinal canal. No bowel obstruction.     12/04/2023 Imaging   PET scan showed  Extensive hypermetabolic neoplastic disease including left lower lobe infiltrative mass extending from the pleura to the hilum with numerous abnormal left-sided hilar and mediastinal nodes.   Additional upper retroperitoneal nodes as well as extensive liver metastases.   Multifocal osseous metastatic disease including the thoracic and lumbar spine, pelvis, right humeral head and left scapula.   Based on the overall appearance and with the patient's laboratory a abnormality this very well could be a primary left lower lobe lung lesion such as small cell versus a neuroendocrine tumor with diffuse metastatic disease.   12/10/2023 Imaging   MRI brain w wo contrast  1. 10 x 9 x 10 mm rounded lesion in the posterior left frontal lobe with surrounding vasogenic edema is consistent with metastatic disease. 2. No other pathologic enhancement is present. 3. Periventricular and scattered subcortical T2 hyperintensities bilaterally are mildly advanced for age. The finding is nonspecific but can be seen in the setting of chronic microvascular ischemia, a demyelinating process such as multiple sclerosis, vasculitis, complicated migraine headaches, or as the sequelae of a prior infectious or inflammatory process.   12/17/2023 Initial Diagnosis   Squamous cell carcinoma lung, left (HCC)  He began experiencing abdominal pain for a week.  Initially presenting as increased  gas and stomach growling despite having eaten. The pain was localized between the rib cage and the  abdomen and described as intermittent.  Later the pain had resolved, but he continues to feel bloated. He has CT abdomen pelvis done which showed liver masses.   Liver biopsy showed  1. Liver, biopsy, right hepatic lesion :      - METASTATIC POORLY DIFFERENTIATED SQUAMOUS CELL CARCINOMA WITH NECROSIS.  Diagnosis Note : Per [facility] a recent PET scan revealed a hypermetabolic left lower lobe lung mass with extensive metastatic lesions. The carcinoma is positive for cytokeratin 7, p40 (greater than 50%), and CD56 (subset). Cytokeratin 20, TTF-1,  chromogranin, synaptophysin, and CDX-2 are negative. In the absence of  chromogranin / synaptophysin staining the presence of patchy CD56 staining is interpreted as non-specific. The pattern of immunoreactivity is consistent with metastatic poorly differentiated squamous cell carcinoma.     12/17/2023 Cancer Staging   Staging form: Lung, AJCC V9 - Clinical stage from 12/17/2023: Tony Mitchell, pM1c - Signed by Babara Call, MD on 12/17/2023 Histopathologic type: Squamous cell carcinoma, NOS Stage prefix: Initial diagnosis Laterality: Left Sites of metastasis: Bone, Liver, Brain   12/23/2023 - 12/25/2023 Chemotherapy   Patient is on Treatment Plan : LUNG Carboplatin  + Paclitaxel  q21d     01/14/2024 -  Chemotherapy   Carboplatin  + Paclitaxel  + Pembrolizumab  (200) D1 q21d x 4 cycles / Pembrolizumab  (200) Maintenance D1 q21d       Imaging   CT chest abdomen pelvis w contrast  1. Nearly complete resolution of previously seen dependent left lower lobe masses and nodularity. Additional small nonspecific pulmonary nodules unchanged. 2. No persistently enlarged mediastinal or left hilar lymph nodes. 3. Significantly diminished size of numerous hypodense liver lesions. 4. Constellation of findings is consistent with treatment response of lung malignancy and associated metastatic disease. 5. Very little CT correlate of previously seen FDG avid osseous metastatic disease;  subtle lucency of the T5 vertebral body at the site of a previously FDG avid metastasis. Other lesions not clearly appreciated by CT. Nuclear scintigraphic bone scan or repeat PET-CT may be helpful to assess for residual metabolically active osseous metastatic disease. 6. Emphysema and diffuse bilateral bronchial wall thickening. 7. Coronary artery disease. 8. Large left inguinal hernia containing nonobstructed sigmoid colon.   Aortic Atherosclerosis (ICD10-I70.0) and Emphysema (ICD10-J43.9).   02/18/2024 Imaging   CT chest abdomen pelvis w contrast showed 1. Nearly complete resolution of previously seen dependent left lower lobe masses and nodularity. Additional small nonspecific pulmonary nodules unchanged. 2. No persistently enlarged mediastinal or left hilar lymph nodes. 3. Significantly diminished size of numerous hypodense liver lesions. 4. Constellation of findings is consistent with treatment response of lung malignancy and associated metastatic disease. 5. Very little CT correlate of previously seen FDG avid osseous metastatic disease; subtle lucency of the T5 vertebral body at the site of a previously FDG avid metastasis. Other lesions not clearly appreciated by CT. Nuclear scintigraphic bone scan or repeat PET-CT may be helpful to assess for residual metabolically active osseous metastatic disease. 6. Emphysema and diffuse bilateral bronchial wall thickening. 7. Coronary artery disease. 8. Large left inguinal hernia containing nonobstructed sigmoid colon.   Aortic Atherosclerosis (ICD10-I70.0) and Emphysema (ICD10-J43.9).     Patient denies changes in bowel habits.  Denies unintentional weight loss night sweats or fever. No history of blood transfusions or hepatitis. He is not on any blood thinners but takes aspirin  for preventive purposes.  He has a history of stage 3 renal  disease diagnosed three years ago after a blood test revealed elevated potassium levels. Since  then, he has made significant lifestyle changes, including stopping alcohol consumption and losing weight. He now maintains a weight between 145 and 151 pounds and walks approximately 5.6 miles daily.  He has a history of skin cancer, treated with resection, and is scheduled for further treatment. His father had prostate cancer.  Patient had a normal PSA in January 2025.   Patient lives at home with wife who has multiple medical problems.  He is the caregiver for her. His daughter passed away last year.  He has granddaughter who lives in [place] .  S/p brain radiation he finished tapering course of steroid.   INTERVAL HISTORY Tony Mitchell is a 83 y.o. male who has above history reviewed by me today presents for follow up visit for treatment of metastatic squamous cell lung cancer.   + Peripheral neuropathy he takes gabapentin  100 mg 2-3 times a day. Seen by Dr. Buckley.  Weight has been stable.  Fatigue has improved He recently came back from a trip in [place].  He has no new complaints.  MEDICAL HISTORY:  Past Medical History:  Diagnosis Date   Acute appendicitis 03/03/2018   Arthritis    Cancer (HCC)    skin cancer   Chronic kidney disease, stage 3b (HCC)    Collapsed lung 2019   x2   COPD (chronic obstructive pulmonary disease) (HCC)    Coronary artery disease    Diabetes mellitus without complication (HCC)    diet controlled-lost 60 lbs and is not on any current meds   GERD (gastroesophageal reflux disease)    Gout    Hyperlipidemia    Hypertension    Sleep apnea    uses cpap   Thoracic aortic aneurysm (HCC)    Vitamin B 12 deficiency     SURGICAL HISTORY: Past Surgical History:  Procedure Laterality Date   CARDIAC CATHETERIZATION     CATARACT EXTRACTION  2012   CHEST TUBE INSERTION     x2   COLONOSCOPY WITH PROPOFOL  N/A 04/14/2021   Procedure: COLONOSCOPY WITH PROPOFOL ;  Surgeon: Maryruth Ole DASEN, MD;  Location: [facility] END[facility];  Service: Endoscopy;   Laterality: N/A;   ESOPHAGOGASTRODUODENOSCOPY (EGD) WITH PROPOFOL  N/A 04/14/2021   Procedure: ESOPHAGOGASTRODUODENOSCOPY (EGD) WITH PROPOFOL ;  Surgeon: Maryruth Ole DASEN, MD;  Location: [facility] ENDOSCOPY;  Service: Endoscopy;  Laterality: N/A;   INSERTION OF MESH  09/22/2021   Procedure: INSERTION OF MESH;  Surgeon: Tye Millet, DO;  Location: [facility] ORS;  Service: General;;   JOINT REPLACEMENT     KNEE SURGERY Left 1965   LAPAROSCOPIC APPENDECTOMY N/A 03/03/2018   Procedure: APPENDECTOMY LAPAROSCOPIC;  Surgeon: Nicholaus Selinda Birmingham, MD;  Location: [facility] ORS;  Service: General;  Laterality: N/A;   TOTAL KNEE ARTHROPLASTY Left 09/16/2018   Procedure: TOTAL KNEE ARTHROPLASTY;  Surgeon: Edie Norleen JINNY, MD;  Location: [facility] ORS;  Service: Orthopedics;  Laterality: Left;    SOCIAL HISTORY: Social History   Socioeconomic History   Marital status: Married    Spouse name: Not on file   Number of children: Not on file   Years of education: Not on file   Highest education level: Not on file  Occupational History   Not on file  Tobacco Use   Smoking status: Former    Current packs/day: 0.00    Average packs/day: 1 pack/day for 40.0 years (40.0 ttl pk-yrs)    Types: Cigarettes    Start date: 06/15/1956  Quit date: 06/15/1996    Years since quitting: 27.9    Passive exposure: Past   Smokeless tobacco: Never  Vaping Use   Vaping status: Never Used  Substance and Sexual Activity   Alcohol use: Not Currently    Alcohol/week: 21.0 standard drinks of alcohol    Types: 21 Shots of liquor per week    Comment: reports last drink 11/03/20   Drug use: No   Sexual activity: Yes  Other Topics Concern   Not on file  Social History Narrative   Not on file   Social Drivers of Health   Financial Resource Strain: Low Risk  (10/30/2023)   Received from [facility]   Overall Financial Resource Strain (CARDIA)    Difficulty of Paying Living Expenses: Not hard at all  Food Insecurity: No  Food Insecurity (10/30/2023)   Received from [facility]   Hunger Vital Sign    Within the past 12 months, you worried that your food would run out before you got the money to buy more.: Never true    Within the past 12 months, the food you bought just didn't last and you didn't have money to get more.: Never true  Transportation Needs: No Transportation Needs (10/30/2023)   Received from [facility] - Transportation    In the past 12 months, has lack of transportation kept you from medical appointments or from getting medications?: No    Lack of Transportation (Non-Medical): No  Physical Activity: Sufficiently Active (07/10/2017)   Received from [facility]   Exercise Vital Sign    Days of Exercise per Week: 5 days    Minutes of Exercise per Session: 60 min  Stress: Stress Concern Present (07/10/2017)   Received from [facility] - Occupational Stress Questionnaire    Feeling of Stress : Rather much  Social Connections: Unknown (07/10/2017)   Received from [facility]   Social Connection and Isolation Panel    Frequency of Communication with Friends and Family: Patient declined    Frequency of Social Gatherings with Friends and Family: Patient declined    Attends Religious Services: Patient declined    Database administrator or Organizations: Patient declined    Attends Engineer, structural: Patient declined    Marital Status: Patient declined  Catering manager Violence: Not on file    FAMILY HISTORY: Family History  Problem Relation Age of Onset   Cancer Father        Unknown   Prostate cancer Father    Lung cancer Neg Hx     ALLERGIES:  has no known allergies.  MEDICATIONS:  Current Outpatient Medications  Medication Sig Dispense Refill   allopurinol  (ZYLOPRIM ) 100 MG tablet Take 100 mg by mouth in the morning and at  bedtime.     ALPRAZolam  (XANAX ) 0.5 MG tablet Take 0.5 mg by mouth at bedtime.     amLODipine  (NORVASC ) 2.5 MG tablet Take 2.5 mg by mouth in the morning.     cephALEXin  (KEFLEX ) 500 MG capsule Take 1 capsule (500 mg total) by mouth 2 (two) times daily. 14 capsule 0   colchicine 0.6 MG tablet Take 2 tablets (1.2mg ) by mouth at first sign of gout flare followed by 1 tablet (0.6mg ) after 1 hour. (Max 1.8mg  within 1 hour)     fenofibrate  (TRICOR ) 145 MG tablet Take 145 mg by mouth  2 (two) times a week. On Wednesday and Sunday     finasteride  (PROSCAR ) 5 MG tablet Take 1 tablet (5 mg total) by mouth daily. 90 tablet 3   furosemide  (LASIX ) 20 MG tablet Take 1 tablet (20 mg total) by mouth daily as needed. 30 tablet 0   gabapentin  (NEURONTIN ) 100 MG capsule 200 mg.     hydrALAZINE  (APRESOLINE ) 25 MG tablet Take 25 mg by mouth 2 (two) times daily.     lansoprazole (PREVACID) 15 MG capsule Take 15 mg by mouth every evening.     oxyCODONE  (OXY IR/ROXICODONE ) 5 MG immediate release tablet Take 1 tablet (5 mg total) by mouth every 6 (six) hours as needed for severe pain (pain score 7-10) or breakthrough pain. 10 tablet 0   prochlorperazine  (COMPAZINE ) 10 MG tablet Take 1 tablet (10 mg total) by mouth every 6 (six) hours as needed for nausea or vomiting. 30 tablet 1   simvastatin  (ZOCOR ) 20 MG tablet Take 1 tablet by mouth 2 (two) times a week. On  Wednesday and Sunday     tamsulosin  (FLOMAX ) 0.4 MG CAPS capsule Take 1 capsule (0.4 mg total) by mouth daily. 30 capsule 11   triamcinolone  ointment (KENALOG ) 0.5 % Apply 1 Application topically 2 (two) times daily. 30 g 0   vitamin B-12 (CYANOCOBALAMIN ) 1000 MCG tablet Take 1,000 mcg by mouth every morning.     No current facility-administered medications for this visit.   Facility-Administered Medications Ordered in Other Visits  Medication Dose Route Frequency Provider Last Rate Last Admin   0.9 %  sodium chloride  infusion   Intravenous Continuous Babara Call,  MD 10 mL/hr at 02/04/24 0921 New Bag at 02/04/24 0921   0.9 %  sodium chloride  infusion   Intravenous Continuous Babara Call, MD   Stopped at 05/04/24 1518   heparin  lock flush 100 unit/mL  500 Units Intracatheter Once PRN Babara Call, MD        Review of Systems  Constitutional:  Negative for appetite change, chills, fatigue, fever and unexpected weight change.  HENT:   Negative for hearing loss and voice change.   Eyes:  Negative for eye problems and icterus.  Respiratory:  Negative for chest tightness, cough and shortness of breath.   Cardiovascular:  Negative for chest pain and leg swelling.  Gastrointestinal:  Negative for abdominal distention and abdominal pain.  Endocrine: Negative for hot flashes.  Genitourinary:  Negative for difficulty urinating, dysuria and frequency.   Musculoskeletal:  Negative for arthralgias.  Skin:  Negative for itching and rash.  Neurological:  Positive for numbness. Negative for light-headedness.  Hematological:  Negative for adenopathy. Does not bruise/bleed easily.  Psychiatric/Behavioral:  Negative for confusion.      PHYSICAL EXAMINATION: ECOG PERFORMANCE STATUS: 1 - Symptomatic but completely ambulatory  Vitals:   05/04/24 1340 05/04/24 1348  BP: (!) 165/65 (!) 152/69  Pulse: 64   Resp: 18   Temp: (!) 97 F (36.1 C)   SpO2: 99%    Filed Weights   05/04/24 1340  Weight: 163 lb (73.9 kg)    Physical Exam Constitutional:      General: He is not in acute distress.    Appearance: He is not diaphoretic.  HENT:     Head: Normocephalic and atraumatic.  Eyes:     General: No scleral icterus. Cardiovascular:     Rate and Rhythm: Normal rate and regular rhythm.     Heart sounds: No murmur heard. Pulmonary:  Effort: Pulmonary effort is normal. No respiratory distress.  Abdominal:     General: Bowel sounds are normal. There is no distension.     Palpations: Abdomen is soft.  Musculoskeletal:        General: Normal range of motion.      Cervical back: Normal range of motion and neck supple.  Skin:    General: Skin is warm and dry.     Findings: No erythema.  Neurological:     Mental Status: He is alert and oriented to person, place, and time. Mental status is at baseline.     Cranial Nerves: No cranial nerve deficit.     Motor: No abnormal muscle tone.  Psychiatric:        Mood and Affect: Mood and affect normal.      LABORATORY DATA:  I have reviewed the data as listed    Latest Ref Rng & Units 05/04/2024    1:32 PM 04/08/2024    8:20 AM 03/17/2024    8:15 AM  CBC  WBC 4.0 - 10.5 K/uL 5.5  3.8  5.6   Hemoglobin 13.0 - 17.0 g/dL 85.7  87.2  87.2   Hematocrit 39.0 - 52.0 % 43.6  38.7  37.3   Platelets 150 - 400 K/uL 200  194  228       Latest Ref Rng & Units 05/04/2024    1:32 PM 04/08/2024    8:20 AM 03/17/2024    8:15 AM  CMP  Glucose 70 - 99 mg/dL 869  885  894   BUN 8 - 23 mg/dL 18  13  15    Creatinine 0.61 - 1.24 mg/dL 9.03  9.10  9.24   Sodium 135 - 145 mmol/L 141  138  134   Potassium 3.5 - 5.1 mmol/L 4.4  4.0  4.0   Chloride 98 - 111 mmol/L 106  104  101   CO2 22 - 32 mmol/L 28  27  25    Calcium 8.9 - 10.3 mg/dL 9.2  9.1  9.0   Total Protein 6.5 - 8.1 g/dL 6.6  6.2  6.4   Total Bilirubin 0.0 - 1.2 mg/dL 0.7  0.7  0.7   Alkaline Phos 38 - 126 U/L 81  77  102   AST 15 - 41 U/L 24  23  24    ALT 0 - 44 U/L 17  17  18       RADIOGRAPHIC STUDIES: I have personally reviewed the radiological images as listed and agreed with the findings in the report. No results found.

## 2024-05-04 NOTE — Assessment & Plan Note (Signed)
 Keytruda  treatments as planned

## 2024-05-04 NOTE — Patient Instructions (Signed)

## 2024-05-04 NOTE — Assessment & Plan Note (Signed)
 Gabapentin  was recently increased to 2-3 time daily.  Follow up with neurology- Referred to acupuncture clinic

## 2024-05-04 NOTE — Progress Notes (Signed)
 Palliative Medicine [facility] at [facility] [phone] [phone]) [phone]   Name: Tony Mitchell Date: 05/04/2024 MRN: [id number]  DOB: 01/28/1941  Patient Care Team: Tony Manna, MD as PCP - General (Internal Medicine) Tony Call, MD as Consulting Physician (Oncology) Tony Gills, RN as Oncology Nurse Navigator    REASON FOR CONSULTATION: Tony Mitchell is a 83 y.o. 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   SOCIAL HISTORY:     reports that he quit smoking about 27 years ago. His smoking use included cigarettes. He started smoking about 67 years ago. He has a 40 pack-year smoking history. He has been exposed to tobacco smoke. He has never used smokeless tobacco. He reports that he does not currently use alcohol after a past usage of about 21.0 standard drinks of alcohol per week. He reports that he does not use drugs.  Patient is married and lives at home with his wife and two beagles.  His only daughter died in Jul 30, 2023.  Patient has a granddaughter in [place] .  Patient owned a pool business for 50 years.  ADVANCE DIRECTIVES:  Not on file  CODE STATUS:   PAST MEDICAL HISTORY: Past Medical History:  Diagnosis Date   Acute appendicitis 03/03/2018   Arthritis    Cancer (HCC)    skin cancer   Chronic kidney disease, stage 3b (HCC)    Collapsed lung 2019   x2   COPD (chronic obstructive pulmonary disease) (HCC)    Coronary artery disease    Diabetes mellitus without complication (HCC)    diet controlled-lost 60 lbs and is not on any current meds   GERD (gastroesophageal reflux disease)    Gout    Hyperlipidemia    Hypertension    Sleep apnea    uses cpap   Thoracic aortic aneurysm (HCC)    Vitamin B 12 deficiency     PAST SURGICAL HISTORY:  Past Surgical History:  Procedure Laterality Date   CARDIAC  CATHETERIZATION     CATARACT EXTRACTION  2012   CHEST TUBE INSERTION     x2   COLONOSCOPY WITH PROPOFOL  N/A 04/14/2021   Procedure: COLONOSCOPY WITH PROPOFOL ;  Surgeon: Maryruth Ole DASEN, MD;  Location: [facility] ENDOSCOPY;  Service: Endoscopy;  Laterality: N/A;   ESOPHAGOGASTRODUODENOSCOPY (EGD) WITH PROPOFOL  N/A 04/14/2021   Procedure: ESOPHAGOGASTRODUODENOSCOPY (EGD) WITH PROPOFOL ;  Surgeon: Maryruth Ole DASEN, MD;  Location: [facility] ENDOSCOPY;  Service: Endoscopy;  Laterality: N/A;   INSERTION OF MESH  09/22/2021   Procedure: INSERTION OF MESH;  Surgeon: Tye Millet, DO;  Location: [facility] ORS;  Service: General;;   JOINT REPLACEMENT     KNEE SURGERY Left 1965   LAPAROSCOPIC APPENDECTOMY N/A 03/03/2018   Procedure: APPENDECTOMY LAPAROSCOPIC;  Surgeon: Nicholaus Selinda Birmingham, MD;  Location: [facility] ORS;  Service: General;  Laterality: N/A;   TOTAL KNEE ARTHROPLASTY Left 09/16/2018   Procedure: TOTAL KNEE ARTHROPLASTY;  Surgeon: Edie Norleen PARAS, MD;  Location: [facility] ORS;  Service: Orthopedics;  Laterality: Left;    HEMATOLOGY/ONCOLOGY HISTORY:  Oncology History  Squamous cell carcinoma lung, left (HCC)  11/22/2023 Imaging   CT abdomen pelvis w contrast  1. Multiple hepatic metastatic lesions. Correlation with history of known malignancy recommended. 2. Serosal implant along the distal stomach versus gastrohepatic adenopathy. 3. Thickened appearance of the gastric antrum and pylorus likely related to underdistention. An infiltrative mass is less likely but not excluded.  Endoscopy may provide better evaluation. 4. Herniation of a segment of the sigmoid colon into the left inguinal canal. No bowel obstruction.     12/04/2023 Imaging   PET scan showed  Extensive hypermetabolic neoplastic disease including left lower lobe infiltrative mass extending from the pleura to the hilum with numerous abnormal left-sided hilar and mediastinal nodes.   Additional upper retroperitoneal nodes as well as extensive  liver metastases.   Multifocal osseous metastatic disease including the thoracic and lumbar spine, pelvis, right humeral head and left scapula.   Based on the overall appearance and with the patient's laboratory a abnormality this very well could be a primary left lower lobe lung lesion such as small cell versus a neuroendocrine tumor with diffuse metastatic disease.   12/10/2023 Imaging   MRI brain w wo contrast  1. 10 x 9 x 10 mm rounded lesion in the posterior left frontal lobe with surrounding vasogenic edema is consistent with metastatic disease. 2. No other pathologic enhancement is present. 3. Periventricular and scattered subcortical T2 hyperintensities bilaterally are mildly advanced for age. The finding is nonspecific but can be seen in the setting of chronic microvascular ischemia, a demyelinating process such as multiple sclerosis, vasculitis, complicated migraine headaches, or as the sequelae of a prior infectious or inflammatory process.   12/17/2023 Initial Diagnosis   Squamous cell carcinoma lung, left (HCC)  He began experiencing abdominal pain for a week.  Initially presenting as increased gas and stomach growling despite having eaten. The pain was localized between the rib cage and the abdomen and described as intermittent.  Later the pain had resolved, but he continues to feel bloated. He has CT abdomen pelvis done which showed liver masses.   Liver biopsy showed  1. Liver, biopsy, right hepatic lesion :      - METASTATIC POORLY DIFFERENTIATED SQUAMOUS CELL CARCINOMA WITH NECROSIS.  Diagnosis Note : Per [facility] a recent PET scan revealed a hypermetabolic left lower lobe lung mass with extensive metastatic lesions. The carcinoma is positive for cytokeratin 7, p40 (greater than 50%), and CD56 (subset). Cytokeratin 20, TTF-1,  chromogranin, synaptophysin, and CDX-2 are negative. In the absence of  chromogranin / synaptophysin staining the presence of patchy CD56 staining  is interpreted as non-specific. The pattern of immunoreactivity is consistent with metastatic poorly differentiated squamous cell carcinoma.     12/17/2023 Cancer Staging   Staging form: Lung, AJCC V9 - Clinical stage from 12/17/2023: [name], pM1c - Signed by Tony Call, MD on 12/17/2023 Histopathologic type: Squamous cell carcinoma, NOS Stage prefix: Initial diagnosis Laterality: Left Sites of metastasis: Bone, Liver, Brain   12/23/2023 - 12/25/2023 Chemotherapy   Patient is on Treatment Plan : LUNG Carboplatin  + Paclitaxel  q21d     01/14/2024 -  Chemotherapy   Carboplatin  + Paclitaxel  + Pembrolizumab  (200) D1 q21d x 4 cycles / Pembrolizumab  (200) Maintenance D1 q21d       Imaging   CT chest abdomen pelvis w contrast  1. Nearly complete resolution of previously seen dependent left lower lobe masses and nodularity. Additional small nonspecific pulmonary nodules unchanged. 2. No persistently enlarged mediastinal or left hilar lymph nodes. 3. Significantly diminished size of numerous hypodense liver lesions. 4. Constellation of findings is consistent with treatment response of lung malignancy and associated metastatic disease. 5. Very little CT correlate of previously seen FDG avid osseous metastatic disease; subtle lucency of the T5 vertebral body at the site of a previously FDG avid metastasis. Other lesions not clearly appreciated by CT.  Nuclear scintigraphic bone scan or repeat PET-CT may be helpful to assess for residual metabolically active osseous metastatic disease. 6. Emphysema and diffuse bilateral bronchial wall thickening. 7. Coronary artery disease. 8. Large left inguinal hernia containing nonobstructed sigmoid colon.   Aortic Atherosclerosis (ICD10-I70.0) and Emphysema (ICD10-J43.9).   02/18/2024 Imaging   CT chest abdomen pelvis w contrast showed 1. Nearly complete resolution of previously seen dependent left lower lobe masses and nodularity. Additional small  nonspecific pulmonary nodules unchanged. 2. No persistently enlarged mediastinal or left hilar lymph nodes. 3. Significantly diminished size of numerous hypodense liver lesions. 4. Constellation of findings is consistent with treatment response of lung malignancy and associated metastatic disease. 5. Very little CT correlate of previously seen FDG avid osseous metastatic disease; subtle lucency of the T5 vertebral body at the site of a previously FDG avid metastasis. Other lesions not clearly appreciated by CT. Nuclear scintigraphic bone scan or repeat PET-CT may be helpful to assess for residual metabolically active osseous metastatic disease. 6. Emphysema and diffuse bilateral bronchial wall thickening. 7. Coronary artery disease. 8. Large left inguinal hernia containing nonobstructed sigmoid colon.   Aortic Atherosclerosis (ICD10-I70.0) and Emphysema (ICD10-J43.9).     ALLERGIES:  has no known allergies.  MEDICATIONS:  Current Outpatient Medications  Medication Sig Dispense Refill   allopurinol  (ZYLOPRIM ) 100 MG tablet Take 100 mg by mouth in the morning and at bedtime.     ALPRAZolam  (XANAX ) 0.5 MG tablet Take 0.5 mg by mouth at bedtime.     amLODipine  (NORVASC ) 2.5 MG tablet Take 2.5 mg by mouth in the morning.     cephALEXin  (KEFLEX ) 500 MG capsule Take 1 capsule (500 mg total) by mouth 2 (two) times daily. 14 capsule 0   colchicine 0.6 MG tablet Take 2 tablets (1.2mg ) by mouth at first sign of gout flare followed by 1 tablet (0.6mg ) after 1 hour. (Max 1.8mg  within 1 hour)     fenofibrate  (TRICOR ) 145 MG tablet Take 145 mg by mouth 2 (two) times a week. On Wednesday and Sunday     finasteride  (PROSCAR ) 5 MG tablet Take 1 tablet (5 mg total) by mouth daily. 90 tablet 3   furosemide  (LASIX ) 20 MG tablet Take 1 tablet (20 mg total) by mouth daily as needed. 30 tablet 0   gabapentin  (NEURONTIN ) 100 MG capsule 200 mg.     hydrALAZINE  (APRESOLINE ) 25 MG tablet Take 25 mg by mouth  2 (two) times daily.     lansoprazole (PREVACID) 15 MG capsule Take 15 mg by mouth every evening.     oxyCODONE  (OXY IR/ROXICODONE ) 5 MG immediate release tablet Take 1 tablet (5 mg total) by mouth every 6 (six) hours as needed for severe pain (pain score 7-10) or breakthrough pain. 10 tablet 0   prochlorperazine  (COMPAZINE ) 10 MG tablet Take 1 tablet (10 mg total) by mouth every 6 (six) hours as needed for nausea or vomiting. 30 tablet 1   simvastatin  (ZOCOR ) 20 MG tablet Take 1 tablet by mouth 2 (two) times a week. On  Wednesday and Sunday     tamsulosin  (FLOMAX ) 0.4 MG CAPS capsule Take 1 capsule (0.4 mg total) by mouth daily. 30 capsule 11   triamcinolone  ointment (KENALOG ) 0.5 % Apply 1 Application topically 2 (two) times daily. 30 g 0   vitamin B-12 (CYANOCOBALAMIN ) 1000 MCG tablet Take 1,000 mcg by mouth every morning.     No current facility-administered medications for this visit.   Facility-Administered Medications Ordered in Other Visits  Medication Dose Route Frequency Provider Last Rate Last Admin   0.9 %  sodium chloride  infusion   Intravenous Continuous Tony Call, MD 10 mL/hr at 02/04/24 0921 New Bag at 02/04/24 0921   0.9 %  sodium chloride  infusion   Intravenous Continuous Tony Call, MD 10 mL/hr at 05/04/24 1433 New Bag at 05/04/24 1433   heparin  lock flush 100 unit/mL  500 Units Intracatheter Once PRN Tony Call, MD       pembrolizumab  (KEYTRUDA ) 200 mg in sodium chloride  0.9 % 50 mL chemo infusion  200 mg Intravenous Once Tony Call, MD 116 mL/hr at 05/04/24 1435 200 mg at 05/04/24 1435    VITAL SIGNS: There were no vitals taken for this visit. There were no vitals filed for this visit.  Estimated body mass index is 25.53 kg/m as calculated from the following:   Height as of 04/08/24: 5' 7 (1.702 m).   Weight as of an earlier encounter on 05/04/24: 163 lb (73.9 kg).  LABS: CBC:    Component Value Date/Time   WBC 5.5 05/04/2024 1332   WBC 6.0 12/12/2023 1015   HGB 14.2  05/04/2024 1332   HGB 13.3 05/27/2014 1712   HCT 43.6 05/04/2024 1332   HCT 40.9 05/27/2014 1712   PLT 200 05/04/2024 1332   PLT 198 05/27/2014 1712   MCV 93.6 05/04/2024 1332   MCV 92 05/27/2014 1712   NEUTROABS 4.1 05/04/2024 1332   LYMPHSABS 1.1 05/04/2024 1332   MONOABS 0.2 05/04/2024 1332   EOSABS 0.0 05/04/2024 1332   BASOSABS 0.0 05/04/2024 1332   Comprehensive Metabolic Panel:    Component Value Date/Time   NA 141 05/04/2024 1332   NA 140 05/27/2014 1712   K 4.4 05/04/2024 1332   K 3.8 05/27/2014 1712   CL 106 05/04/2024 1332   CL 105 05/27/2014 1712   CO2 28 05/04/2024 1332   CO2 28 05/27/2014 1712   BUN 18 05/04/2024 1332   BUN 11 05/27/2014 1712   CREATININE 0.96 05/04/2024 1332   CREATININE 1.02 05/27/2014 1712   GLUCOSE 130 (H) 05/04/2024 1332   GLUCOSE 120 (H) 05/27/2014 1712   CALCIUM 9.2 05/04/2024 1332   CALCIUM 8.2 (L) 05/27/2014 1712   AST 24 05/04/2024 1332   ALT 17 05/04/2024 1332   ALKPHOS 81 05/04/2024 1332   BILITOT 0.7 05/04/2024 1332   PROT 6.6 05/04/2024 1332   ALBUMIN 4.1 05/04/2024 1332    RADIOGRAPHIC STUDIES: No results found.   PERFORMANCE STATUS (ECOG) : 1 - Symptomatic but completely ambulatory  Review of Systems Unless otherwise noted, a complete review of systems is negative.  Physical Exam General: NAD Pulmonary: Unlabored Extremities: no edema, no joint deformities Skin: no rashes Neurological: nonfocal  IMPRESSION: Follow-up visit.  Patient seen in infusion.  Patient reports he is doing well.  Denies significant changes or concerns.  No symptomatic complaints at present.  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  Patient took a recent trip to [place]  to visit family.  He had not seen them in many years.  PLAN: - Continue current scope of treatment - MOST Form previously reviewed -  Follow-up telephone visit 1 month  Patient expressed understanding and was in agreement with this plan. He also understands that He can Mitchell the clinic at any time with any questions, concerns, or complaints.     Time Total: 15 minutes  Visit consisted of counseling and education dealing with the complex and emotionally intense issues of symptom management and palliative care in the setting of serious and potentially life-threatening illness.Greater than 50%  of this time was spent counseling and coordinating care related to the above assessment and plan.  Signed by: Fonda Mower, PhD, NP-C

## 2024-05-04 NOTE — Assessment & Plan Note (Addendum)
 Imaging results and pathology results were reviewed and discussed with patient. 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 NGS showed TMB 53.2 high, PD-L1 <1%, SMARCB1- high TMB, Keytruda  was added on cycle 2 CT scan after 3 cycles showed partial response.  Status post 4 cycles of carboplatin , Taxol  and 3 doses of Keytruda . Labs are reviewed and discussed with patient.  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 Patient has high TMB.  continue Keytruda  for maintenance.   Plan to repeat CT in late Sept/early Oct

## 2024-05-05 ENCOUNTER — Encounter

## 2024-05-05 DIAGNOSIS — C3492 Malignant neoplasm of unspecified part of left bronchus or lung: Secondary | ICD-10-CM

## 2024-05-05 NOTE — Progress Notes (Signed)
 Daily Session Note  Patient Details  Name: Tony Mitchell MRN: [id number] Date of Birth: 1941-05-23 Referring Provider:   [name] from 02/27/2024 in [facility] Cardiac and Pulmonary Rehab  Referring Provider Babara Call, MD    Encounter Date: 05/05/2024  Check In:  Session Check In - 05/05/24 1347       Check-In   Supervising physician immediately available to respond to emergencies See telemetry face sheet for immediately available ER MD    Location [facility]-Cardiac & Pulmonary Rehab    Staff Present Rollene Paterson, MS, Exercise Physiologist    Virtual Visit No    Medication changes reported     No    Fall or balance concerns reported    No    Warm-up and Cool-down Performed on first and last piece of equipment    Resistance Training Performed Yes    VAD Patient? No    PAD/SET Patient? No      Pain Assessment   Currently in Pain? No/denies    Multiple Pain Sites No             Social History   Tobacco Use  Smoking Status Former   Current packs/day: 0.00   Average packs/day: 1 pack/day for 40.0 years (40.0 ttl pk-yrs)   Types: Cigarettes   Start date: 06/15/1956   Quit date: 06/15/1996   Years since quitting: 27.9   Passive exposure: Past  Smokeless Tobacco Never    Goals Met:  Independence with exercise equipment Exercise tolerated well No report of concerns or symptoms today  Goals Unmet:  Not Applicable  Comments: Pt able to follow exercise prescription today without complaint.  Will continue to monitor for progression.    Dr. Oneil Pinal is Medical Director for [facility] Cardiac Rehabilitation.  Dr. Fuad Aleskerov is Medical Director for [facility] Pulmonary Rehabilitation.

## 2024-05-07 ENCOUNTER — Ambulatory Visit
Admission: RE | Admit: 2024-05-07 | Discharge: 2024-05-07 | Disposition: A | Discharge status: Home / Self Care | Source: Ambulatory Visit | Attending: Radiation Oncology | Admitting: Radiation Oncology

## 2024-05-07 ENCOUNTER — Encounter: Admitting: *Deleted

## 2024-05-07 ENCOUNTER — Encounter: Payer: Self-pay | Admitting: Radiation Oncology

## 2024-05-07 VITALS — BP 131/71 | HR 65 | Temp 97.4°F | Resp 16 | Wt 159.0 lb

## 2024-05-07 DIAGNOSIS — C7931 Secondary malignant neoplasm of brain: Secondary | ICD-10-CM | POA: Diagnosis present

## 2024-05-07 DIAGNOSIS — C3492 Malignant neoplasm of unspecified part of left bronchus or lung: Secondary | ICD-10-CM

## 2024-05-07 NOTE — Progress Notes (Signed)
 Daily Session Note  Patient Details  Name: Tony Mitchell MRN: [id number] Date of Birth: 07-26-1941 Referring Provider:   [name] & Exercise from 02/27/2024 in [facility] Cardiac and Pulmonary Rehab  Referring Provider Babara Call, MD    Encounter Date: 05/07/2024  Check In:  Session Check In - 05/07/24 1237       Check-In   Supervising physician immediately available to respond to emergencies See telemetry face sheet for immediately available ER MD    Location [facility]-Cardiac & Pulmonary Rehab    Staff Present Hoy Rodney RN,BSN;Margaret Best, MS, Exercise Physiologist    Virtual Visit No    Medication changes reported     No    Fall or balance concerns reported    No    Warm-up and Cool-down Performed on first and last piece of equipment    Resistance Training Performed Yes    VAD Patient? No    PAD/SET Patient? No      Pain Assessment   Currently in Pain? No/denies             Social History   Tobacco Use  Smoking Status Former   Current packs/day: 0.00   Average packs/day: 1 pack/day for 40.0 years (40.0 ttl pk-yrs)   Types: Cigarettes   Start date: 06/15/1956   Quit date: 06/15/1996   Years since quitting: 27.9   Passive exposure: Past  Smokeless Tobacco Never    Goals Met:  Independence with exercise equipment Exercise tolerated well No report of concerns or symptoms today Strength training completed today  Goals Unmet:  Not Applicable  Comments: Pt able to follow exercise prescription today without complaint.  Will continue to monitor for progression.    Dr. Oneil Pinal is Medical Director for [facility] Cardiac Rehabilitation.  Dr. Fuad Aleskerov is Medical Director for [facility] Pulmonary Rehabilitation.

## 2024-05-07 NOTE — Progress Notes (Signed)
 Radiation Oncology Follow up Note 83-month follow-up Name: Tony Mitchell   Date:   05/07/2024 MRN:  [id number] DOB: 1941-02-28    This 83 y.o. male presents to the clinic today for status post SRS for solitary brain metastasis in patient with known stage IV squamous cell carcinoma of the lung.  REFERRING PROVIDER: Sadie Manna, MD  HPI: Patient is a 83 year old male now at 4 months of received SRS for solitary brain metastasis and patient with known stage IV squamous of carcinoma of the lung.  He is currently being treated with Taxol  and Keytruda  tolerating that well.  He is having no neurologic problems specifically denies headaches change in visual fields any focal neurologic deficits.  He had an MRI scan.  Back in August showing peripheral enhancing metastatic lesion previously demonstrated with the posterior left frontal lobe no longer appreciated.  No other new sites of intracranial metastatic disease noted.  COMPLICATIONS OF TREATMENT: none  FOLLOW UP COMPLIANCE: keeps appointments   PHYSICAL EXAM:  BP 131/71   Pulse 65   Temp (!) 97.4 F (36.3 C) (Tympanic)   Resp 16   Wt 159 lb (72.1 kg)   BMI 24.90 kg/m  Well-developed well-nourished patient in NAD. HEENT reveals PERLA, EOMI, discs not visualized.  Oral cavity is clear. No oral mucosal lesions are identified. Neck is clear without evidence of cervical or supraclavicular adenopathy. Lungs are clear to A&P. Cardiac examination is essentially unremarkable with regular rate and rhythm without murmur rub or thrill. Abdomen is benign with no organomegaly or masses noted. Motor sensory and DTR levels are equal and symmetric in the upper and lower extremities. Cranial nerves II through XII are grossly intact. Proprioception is intact. No peripheral adenopathy or edema is identified. No motor or sensory levels are noted. Crude visual fields are within normal range.  RADIOLOGY RESULTS: MRI scan reviewed compatible with above-stated  findings  PLAN: At the present time patient is doing well brain has had a complete response to therapy.  Patient is scheduled for further chest imaging as well as of the pelvis later this month.  At this time I have asked to see him back in 6 months for follow-up.  He continues close follow-up care with neuro-oncology.  Patient knows to call sooner with any concerns.  I would like to take this opportunity to thank you for allowing me to participate in the care of your patient.SABRA Marcey Penton, MD

## 2024-05-08 ENCOUNTER — Telehealth: Payer: Self-pay | Admitting: *Deleted

## 2024-05-08 ENCOUNTER — Inpatient Hospital Stay (HOSPITAL_BASED_OUTPATIENT_CLINIC_OR_DEPARTMENT_OTHER): Admitting: Hospice and Palliative Medicine

## 2024-05-08 DIAGNOSIS — J069 Acute upper respiratory infection, unspecified: Secondary | ICD-10-CM

## 2024-05-08 DIAGNOSIS — C3492 Malignant neoplasm of unspecified part of left bronchus or lung: Secondary | ICD-10-CM | POA: Diagnosis not present

## 2024-05-08 MED ORDER — AMOXICILLIN 500 MG PO CAPS
500.0000 mg | ORAL_CAPSULE | Freq: Three times a day (TID) | ORAL | 0 refills | Status: DC
Start: 2024-05-08 — End: 2024-06-16

## 2024-05-08 NOTE — Telephone Encounter (Signed)
 Pt called in to report new symptoms of fever, productive cough, and congestion that started last night and into this morning. States that fever is at 99 and his cough is producing yellow sputum. Pt recently received the flu and pneumonia vaccine this week in addition to lung cancer treatment with keytruda . Pt is asking for further guidance regarding new symptoms. Message sent to [facility] team to coordinate further care.

## 2024-05-08 NOTE — Progress Notes (Signed)
 Addendum; Patient is using his CPAP nightly and is benefiting from it    Tamra Leventhal MD

## 2024-05-08 NOTE — Progress Notes (Signed)
 Virtual Visit via Video Note  I connected with Tony Mitchell on 05/08/24 at  2:30 PM EDT by a video enabled telemedicine application and verified that I am speaking with the correct person using two identifiers.  Location: Patient: Home Provider: Clinic   I discussed the limitations of evaluation and management by telemedicine and the availability of in person appointments. The patient expressed understanding and agreed to proceed.  History of Present Illness: Tony Mitchell is a 83 y.o. 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    Observations/Objective: Virtual visit with patient.  He endorses 24 to 48 hours of sinus drainage and productive cough with yellow phlegm.  He thought he had a low-grade temperature but Tmax 99.0.  Denies chills or muscle aches.  States that cough is intermittent.  No shortness of breath or chest pain.  Has taken over-the-counter cold and flu medication.  No known sick contacts.  States that vital signs including SpO2 are normal.  Assessment and Plan: URI -likely viral but patient is at higher risk of infection given lung cancer and active treatment.  Will empirically cover with amoxicillin  500 mg 3 times daily x 5 days.  Also discussed supportive care including Mucinex and OTC antitussive .  Recommended fluids and rest.  ED triggers reviewed  Follow Up Instructions: MD follow-up 2 weeks   I discussed the assessment and treatment plan with the patient. The patient was provided an opportunity to ask questions and all were answered. The patient agreed with the plan and demonstrated an understanding of the instructions.   The patient was advised to call back or seek an in-person evaluation if the symptoms worsen or if the condition fails to improve as anticipated.  I provided 15 minutes of non-face-to-face time during this encounter.   FONDA JONELLE MOWER,  NP

## 2024-05-08 NOTE — Telephone Encounter (Signed)
 Patient had a mychart visit with Josh today.

## 2024-05-12 ENCOUNTER — Encounter

## 2024-05-12 DIAGNOSIS — C3492 Malignant neoplasm of unspecified part of left bronchus or lung: Secondary | ICD-10-CM

## 2024-05-12 NOTE — Progress Notes (Signed)
 Daily Session Note  Patient Details  Name: Tony Mitchell MRN: [id number] Date of Birth: [birth year] Referring Provider:   [name] from 02/27/2024 in [facility] Cardiac and Pulmonary Rehab  Referring Provider Babara Call, MD    Encounter Date: 05/12/2024  Check In:  Session Check In - 05/12/24 1236       Check-In   Supervising physician immediately available to respond to emergencies See telemetry face sheet for immediately available ER MD    Location [facility]-Cardiac & Pulmonary Rehab    Staff Present Rollene Paterson, MS, Exercise Physiologist;Shereta Crothers, BS, Exercise Physiologist    Virtual Visit No    Medication changes reported     No    Fall or balance concerns reported    No    Warm-up and Cool-down Performed on first and last piece of equipment    Resistance Training Performed Yes    VAD Patient? No    PAD/SET Patient? No      Pain Assessment   Currently in Pain? No/denies    Multiple Pain Sites No             Social History   Tobacco Use  Smoking Status Former   Current packs/day: 0.00   Average packs/day: 1 pack/day for 40.0 years (40.0 ttl pk-yrs)   Types: Cigarettes   Start date: 06/15/1956   Quit date: 06/15/1996   Years since quitting: 27.9   Passive exposure: Past  Smokeless Tobacco Never    Goals Met:  Independence with exercise equipment Exercise tolerated well No report of concerns or symptoms today  Goals Unmet:  Not Applicable  Comments: Pt able to follow exercise prescription today without complaint.  Will continue to monitor for progression.    Dr. Oneil Pinal is Medical Director for [facility] Cardiac Rehabilitation.  Dr. Fuad Aleskerov is Medical Director for [facility] Pulmonary Rehabilitation.

## 2024-05-13 ENCOUNTER — Ambulatory Visit
Admission: RE | Admit: 2024-05-13 | Discharge: 2024-05-13 | Disposition: A | Discharge status: Home / Self Care | Source: Ambulatory Visit | Attending: Oncology | Admitting: Oncology

## 2024-05-13 DIAGNOSIS — C3492 Malignant neoplasm of unspecified part of left bronchus or lung: Secondary | ICD-10-CM | POA: Diagnosis present

## 2024-05-13 MED ORDER — IOHEXOL 300 MG/ML  SOLN
100.0000 mL | Freq: Once | INTRAMUSCULAR | Status: AC | PRN
  Administered 2024-05-13: 100 mL via INTRAVENOUS

## 2024-05-14 ENCOUNTER — Encounter

## 2024-05-14 DIAGNOSIS — C3492 Malignant neoplasm of unspecified part of left bronchus or lung: Secondary | ICD-10-CM

## 2024-05-14 NOTE — Progress Notes (Signed)
 Daily Session Note  Patient Details  Name: Tony Mitchell MRN: [id number] Date of Birth: [birth year] Referring Provider:   [name] from 02/27/2024 in [facility] Cardiac and Pulmonary Rehab  Referring Provider Babara Call, MD    Encounter Date: 05/14/2024  Check In:  Session Check In - 05/14/24 1329       Check-In   Supervising physician immediately available to respond to emergencies See telemetry face sheet for immediately available ER MD    Location [facility]-Cardiac & Pulmonary Rehab    Staff Present Rollene Paterson, MS, Exercise Physiologist;Maxon Conetta BS, Exercise Physiologist    Virtual Visit No    Medication changes reported     No    Fall or balance concerns reported    No    Warm-up and Cool-down Performed on first and last piece of equipment    Resistance Training Performed Yes    VAD Patient? No    PAD/SET Patient? No      Pain Assessment   Currently in Pain? No/denies    Multiple Pain Sites No             Social History   Tobacco Use  Smoking Status Former   Current packs/day: 0.00   Average packs/day: 1 pack/day for 40.0 years (40.0 ttl pk-yrs)   Types: Cigarettes   Start date: 06/15/1956   Quit date: 06/15/1996   Years since quitting: 27.9   Passive exposure: Past  Smokeless Tobacco Never    Goals Met:  Independence with exercise equipment Exercise tolerated well No report of concerns or symptoms today  Goals Unmet:  Not Applicable  Comments: Pt able to follow exercise prescription today without complaint.  Will continue to monitor for progression.    Dr. Oneil Pinal is Medical Director for [facility] Cardiac Rehabilitation.  Dr. Fuad Aleskerov is Medical Director for [facility] Pulmonary Rehabilitation.

## 2024-05-19 ENCOUNTER — Encounter

## 2024-05-19 DIAGNOSIS — C3492 Malignant neoplasm of unspecified part of left bronchus or lung: Secondary | ICD-10-CM

## 2024-05-19 NOTE — Progress Notes (Signed)
 Daily Session Note  Patient Details  Name: Tony Mitchell MRN: [id number] Date of Birth: October 04, 1940 Referring Provider:   [name] from 02/27/2024 in [facility] Cardiac and Pulmonary Rehab  Referring Provider Babara Call, MD    Encounter Date: 05/19/2024  Check In:  Session Check In - 05/19/24 1233       Check-In   Supervising physician immediately available to respond to emergencies See telemetry face sheet for immediately available ER MD    Location [facility]-Cardiac & Pulmonary Rehab    Staff Present Rollene Paterson, MS, Exercise Physiologist;Winifred Balogh, BS, Exercise Physiologist    Virtual Visit No    Medication changes reported     No    Fall or balance concerns reported    No    Warm-up and Cool-down Performed on first and last piece of equipment    Resistance Training Performed Yes    VAD Patient? No    PAD/SET Patient? No      Pain Assessment   Currently in Pain? No/denies    Multiple Pain Sites No             Social History   Tobacco Use  Smoking Status Former   Current packs/day: 0.00   Average packs/day: 1 pack/day for 40.0 years (40.0 ttl pk-yrs)   Types: Cigarettes   Start date: 06/15/1956   Quit date: 06/15/1996   Years since quitting: 27.9   Passive exposure: Past  Smokeless Tobacco Never    Goals Met:  Independence with exercise equipment Exercise tolerated well No report of concerns or symptoms today  Goals Unmet:  Not Applicable  Comments: Pt able to follow exercise prescription today without complaint.  Will continue to monitor for progression.    Dr. Oneil Pinal is Medical Director for [facility] Cardiac Rehabilitation.  Dr. Fuad Aleskerov is Medical Director for [facility] Pulmonary Rehabilitation.

## 2024-05-21 ENCOUNTER — Encounter: Attending: Hospice and Palliative Medicine

## 2024-05-21 DIAGNOSIS — C3492 Malignant neoplasm of unspecified part of left bronchus or lung: Secondary | ICD-10-CM | POA: Insufficient documentation

## 2024-05-21 NOTE — Progress Notes (Signed)
 Daily Session Note  Patient Details  Name: Tony Mitchell MRN: [id number] Date of Birth: [birth year] Referring Provider:   [name] from 02/27/2024 in [facility] Cardiac and Pulmonary Rehab  Referring Provider Babara Call, MD    Encounter Date: 05/21/2024  Check In:  Session Check In - 05/21/24 1332       Check-In   Supervising physician immediately available to respond to emergencies See telemetry face sheet for immediately available ER MD    Location [facility]-Cardiac & Pulmonary Rehab    Staff Present Rollene Paterson, MS, Exercise Physiologist;Maxon Conetta BS, Exercise Physiologist    Virtual Visit No    Medication changes reported     No    Fall or balance concerns reported    No    Warm-up and Cool-down Performed on first and last piece of equipment    Resistance Training Performed Yes    VAD Patient? No    PAD/SET Patient? No      Pain Assessment   Currently in Pain? No/denies    Multiple Pain Sites No             Social History   Tobacco Use  Smoking Status Former   Current packs/day: 0.00   Average packs/day: 1 pack/day for 40.0 years (40.0 ttl pk-yrs)   Types: Cigarettes   Start date: 06/15/1956   Quit date: 06/15/1996   Years since quitting: 27.9   Passive exposure: Past  Smokeless Tobacco Never    Goals Met:  Independence with exercise equipment Exercise tolerated well No report of concerns or symptoms today  Goals Unmet:  Not Applicable  Comments: Pt able to follow exercise prescription today without complaint.  Will continue to monitor for progression.    Dr. Oneil Pinal is Medical Director for [facility] Cardiac Rehabilitation.  Dr. Fuad Aleskerov is Medical Director for [facility] Pulmonary Rehabilitation.

## 2024-05-26 ENCOUNTER — Ambulatory Visit

## 2024-05-26 ENCOUNTER — Encounter: Payer: Self-pay | Admitting: Oncology

## 2024-05-26 ENCOUNTER — Inpatient Hospital Stay: Attending: Oncology

## 2024-05-26 ENCOUNTER — Inpatient Hospital Stay

## 2024-05-26 ENCOUNTER — Inpatient Hospital Stay (HOSPITAL_BASED_OUTPATIENT_CLINIC_OR_DEPARTMENT_OTHER): Admitting: Oncology

## 2024-05-26 VITALS — BP 136/71 | HR 57

## 2024-05-26 VITALS — BP 157/70 | HR 62 | Temp 98.7°F | Resp 20 | Wt 159.0 lb

## 2024-05-26 DIAGNOSIS — C3492 Malignant neoplasm of unspecified part of left bronchus or lung: Secondary | ICD-10-CM

## 2024-05-26 DIAGNOSIS — E785 Hyperlipidemia, unspecified: Secondary | ICD-10-CM | POA: Diagnosis not present

## 2024-05-26 DIAGNOSIS — C7931 Secondary malignant neoplasm of brain: Secondary | ICD-10-CM | POA: Insufficient documentation

## 2024-05-26 DIAGNOSIS — G4730 Sleep apnea, unspecified: Secondary | ICD-10-CM | POA: Diagnosis not present

## 2024-05-26 DIAGNOSIS — Z923 Personal history of irradiation: Secondary | ICD-10-CM | POA: Insufficient documentation

## 2024-05-26 DIAGNOSIS — C7951 Secondary malignant neoplasm of bone: Secondary | ICD-10-CM | POA: Insufficient documentation

## 2024-05-26 DIAGNOSIS — I700 Atherosclerosis of aorta: Secondary | ICD-10-CM | POA: Diagnosis not present

## 2024-05-26 DIAGNOSIS — Z5112 Encounter for antineoplastic immunotherapy: Secondary | ICD-10-CM

## 2024-05-26 DIAGNOSIS — Z79899 Other long term (current) drug therapy: Secondary | ICD-10-CM | POA: Diagnosis not present

## 2024-05-26 DIAGNOSIS — J432 Centrilobular emphysema: Secondary | ICD-10-CM | POA: Insufficient documentation

## 2024-05-26 DIAGNOSIS — E1122 Type 2 diabetes mellitus with diabetic chronic kidney disease: Secondary | ICD-10-CM | POA: Diagnosis not present

## 2024-05-26 DIAGNOSIS — M109 Gout, unspecified: Secondary | ICD-10-CM | POA: Insufficient documentation

## 2024-05-26 DIAGNOSIS — G620 Drug-induced polyneuropathy: Secondary | ICD-10-CM

## 2024-05-26 DIAGNOSIS — I2510 Atherosclerotic heart disease of native coronary artery without angina pectoris: Secondary | ICD-10-CM | POA: Diagnosis not present

## 2024-05-26 DIAGNOSIS — E1142 Type 2 diabetes mellitus with diabetic polyneuropathy: Secondary | ICD-10-CM | POA: Diagnosis not present

## 2024-05-26 DIAGNOSIS — T451X5A Adverse effect of antineoplastic and immunosuppressive drugs, initial encounter: Secondary | ICD-10-CM

## 2024-05-26 DIAGNOSIS — Z85828 Personal history of other malignant neoplasm of skin: Secondary | ICD-10-CM | POA: Insufficient documentation

## 2024-05-26 DIAGNOSIS — C3412 Malignant neoplasm of upper lobe, left bronchus or lung: Secondary | ICD-10-CM | POA: Insufficient documentation

## 2024-05-26 DIAGNOSIS — I10 Essential (primary) hypertension: Secondary | ICD-10-CM | POA: Diagnosis not present

## 2024-05-26 LAB — CBC WITH DIFFERENTIAL (CANCER CENTER ONLY)
Abs Immature Granulocytes: 0 K/uL (ref 0.00–0.07)
Basophils Absolute: 0 K/uL (ref 0.0–0.1)
Basophils Relative: 0 %
Eosinophils Absolute: 0.1 K/uL (ref 0.0–0.5)
Eosinophils Relative: 2 %
HCT: 43.8 % (ref 39.0–52.0)
Hemoglobin: 14.6 g/dL (ref 13.0–17.0)
Immature Granulocytes: 0 %
Lymphocytes Relative: 34 %
Lymphs Abs: 1.6 K/uL (ref 0.7–4.0)
MCH: 30.2 pg (ref 26.0–34.0)
MCHC: 33.3 g/dL (ref 30.0–36.0)
MCV: 90.7 fL (ref 80.0–100.0)
Monocytes Absolute: 0.5 K/uL (ref 0.1–1.0)
Monocytes Relative: 10 %
Neutro Abs: 2.5 K/uL (ref 1.7–7.7)
Neutrophils Relative %: 54 %
Platelet Count: 202 K/uL (ref 150–400)
RBC: 4.83 MIL/uL (ref 4.22–5.81)
RDW: 11.9 % (ref 11.5–15.5)
WBC Count: 4.6 K/uL (ref 4.0–10.5)
nRBC: 0 % (ref 0.0–0.2)

## 2024-05-26 LAB — CMP (CANCER CENTER ONLY)
ALT: 18 U/L (ref 0–44)
AST: 26 U/L (ref 15–41)
Albumin: 4.2 g/dL (ref 3.5–5.0)
Alkaline Phosphatase: 69 U/L (ref 38–126)
Anion gap: 11 (ref 5–15)
BUN: 16 mg/dL (ref 8–23)
CO2: 27 mmol/L (ref 22–32)
Calcium: 9.3 mg/dL (ref 8.9–10.3)
Chloride: 101 mmol/L (ref 98–111)
Creatinine: 0.89 mg/dL (ref 0.61–1.24)
GFR, Estimated: >60 mL/min (ref >60)
Glucose, Bld: 115 mg/dL — ABNORMAL HIGH (ref 70–99)
Potassium: 4 mmol/L (ref 3.5–5.1)
Sodium: 139 mmol/L (ref 135–145)
Total Bilirubin: 0.8 mg/dL (ref 0.0–1.2)
Total Protein: 6.7 g/dL (ref 6.5–8.1)

## 2024-05-26 LAB — TSH: TSH: 0.811 u[IU]/mL (ref 0.350–4.500)

## 2024-05-26 MED ORDER — SODIUM CHLORIDE 0.9 % IV SOLN
INTRAVENOUS | Status: DC
  Filled 2024-05-26: qty 250

## 2024-05-26 MED ORDER — SODIUM CHLORIDE 0.9 % IV SOLN
200.0000 mg | Freq: Once | INTRAVENOUS | Status: AC
  Administered 2024-05-26: 200 mg via INTRAVENOUS
  Filled 2024-05-26: qty 8

## 2024-05-26 NOTE — Progress Notes (Signed)
 Hematology/Oncology Progress note [phone] [phone]     CHIEF COMPLAINTS/PURPOSE OF CONSULTATION:  Metastatic squamous cell carcinoma  ASSESSMENT & PLAN:   Cancer Staging  Squamous cell carcinoma lung, left (HCC) Staging form: Lung, AJCC V9 - Clinical stage from 12/17/2023: Tony Mitchell, pM1c - Signed by Babara Call, MD on 12/17/2023   Squamous cell carcinoma lung, left [facility]) Imaging results and pathology results were reviewed and discussed with patient. 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 NGS showed TMB 53.2 high, PD-L1 <1%, SMARCB1- high TMB, Keytruda  was added on cycle 2 Previously on carboplatin , Taxol  Keytruda . Currently on Keytruda  maintenance CT scan showed continue partial response.   Labs are reviewed and discussed with patient.   continue Keytruda  for maintenance.       Chemotherapy-induced neuropathy Gabapentin  was recently increased to 2-3 time daily.  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 Repeat brain MRI showed treatment response . He was seen by neurology Dr. Buckley.    Orders Placed This Encounter  Procedures   CBC with Differential (Cancer Center Only)    Standing Status:   Future    Expected Date:   06/16/2024    Expiration Date:   06/16/2025   CMP (Cancer Center only)    Standing Status:   Future    Expected Date:   06/16/2024    Expiration Date:   06/16/2025   CBC with Differential (Cancer Center Only)    Standing Status:   Future    Expected Date:   07/07/2024    Expiration Date:   07/07/2025   CMP (Cancer Center only)    Standing Status:   Future    Expected Date:   07/07/2024    Expiration Date:   07/07/2025   CBC with Differential (Cancer Center Only)    Standing Status:   Future     Expected Date:   07/28/2024    Expiration Date:   07/28/2025   CMP (Cancer Center only)    Standing Status:   Future    Expected Date:   07/28/2024    Expiration Date:   07/28/2025   T4    Standing Status:   Future    Expected Date:   07/28/2024    Expiration Date:   07/28/2025   TSH    Standing Status:   Future    Expected Date:   07/28/2024    Expiration Date:   07/28/2025   CBC with Differential (Cancer Center Only)    Standing Status:   Future    Expected Date:   08/25/2024    Expiration Date:   08/25/2025   CMP (Cancer Center only)    Standing Status:   Future    Expected Date:   08/25/2024    Expiration Date:   08/25/2025   Follow up  3 weeks  All questions were answered. The patient knows to call the clinic with any problems, questions or concerns.  Call Babara, MD, PhD [facility] Hematology Oncology 05/26/2024    HISTORY OF PRESENTING ILLNESS:  Tony Mitchell 83 y.o. male presents for follow up of stage IV lung SCC  Oncology History  Squamous cell carcinoma lung, left (HCC)  11/22/2023 Imaging   CT abdomen pelvis w contrast  1. Multiple hepatic metastatic lesions. Correlation with history of known malignancy recommended. 2. Serosal  implant along the distal stomach versus gastrohepatic adenopathy. 3. Thickened appearance of the gastric antrum and pylorus likely related to underdistention. An infiltrative mass is less likely but not excluded. Endoscopy may provide better evaluation. 4. Herniation of a segment of the sigmoid colon into the left inguinal canal. No bowel obstruction.     12/04/2023 Imaging   PET scan showed  Extensive hypermetabolic neoplastic disease including left lower lobe infiltrative mass extending from the pleura to the hilum with numerous abnormal left-sided hilar and mediastinal nodes.   Additional upper retroperitoneal nodes as well as extensive liver metastases.   Multifocal osseous metastatic disease including the thoracic and lumbar spine,  pelvis, right humeral head and left scapula.   Based on the overall appearance and with the patient's laboratory a abnormality this very well could be a primary left lower lobe lung lesion such as small cell versus a neuroendocrine tumor with diffuse metastatic disease.   12/10/2023 Imaging   MRI brain w wo contrast  1. 10 x 9 x 10 mm rounded lesion in the posterior left frontal lobe with surrounding vasogenic edema is consistent with metastatic disease. 2. No other pathologic enhancement is present. 3. Periventricular and scattered subcortical T2 hyperintensities bilaterally are mildly advanced for age. The finding is nonspecific but can be seen in the setting of chronic microvascular ischemia, a demyelinating process such as multiple sclerosis, vasculitis, complicated migraine headaches, or as the sequelae of a prior infectious or inflammatory process.   12/17/2023 Initial Diagnosis   Squamous cell carcinoma lung, left (HCC)  He began experiencing abdominal pain for a week.  Initially presenting as increased gas and stomach growling despite having eaten. The pain was localized between the rib cage and the abdomen and described as intermittent.  Later the pain had resolved, but he continues to feel bloated. He has CT abdomen pelvis done which showed liver masses.   Liver biopsy showed  1. Liver, biopsy, right hepatic lesion :      - METASTATIC POORLY DIFFERENTIATED SQUAMOUS CELL CARCINOMA WITH NECROSIS.  Diagnosis Note : Per [facility] a recent PET scan revealed a hypermetabolic left lower lobe lung mass with extensive metastatic lesions. The carcinoma is positive for cytokeratin 7, p40 (greater than 50%), and CD56 (subset). Cytokeratin 20, TTF-1,  chromogranin, synaptophysin, and CDX-2 are negative. In the absence of  chromogranin / synaptophysin staining the presence of patchy CD56 staining is interpreted as non-specific. The pattern of immunoreactivity is consistent with metastatic poorly  differentiated squamous cell carcinoma.     12/17/2023 Cancer Staging   Staging form: Lung, AJCC V9 - Clinical stage from 12/17/2023: Tony Mitchell, pM1c - Signed by Babara Call, MD on 12/17/2023 Histopathologic type: Squamous cell carcinoma, NOS Stage prefix: Initial diagnosis Laterality: Left Sites of metastasis: Bone, Liver, Brain   12/23/2023 - 12/25/2023 Chemotherapy   Patient is on Treatment Plan : LUNG Carboplatin  + Paclitaxel  q21d     01/14/2024 -  Chemotherapy   Carboplatin  + Paclitaxel  + Pembrolizumab  (200) D1 q21d x 4 cycles / Pembrolizumab  (200) Maintenance D1 q21d       Imaging   CT chest abdomen pelvis w contrast  1. Nearly complete resolution of previously seen dependent left lower lobe masses and nodularity. Additional small nonspecific pulmonary nodules unchanged. 2. No persistently enlarged mediastinal or left hilar lymph nodes. 3. Significantly diminished size of numerous hypodense liver lesions. 4. Constellation of findings is consistent with treatment response of lung malignancy and associated metastatic disease. 5. Very little CT correlate of  previously seen FDG avid osseous metastatic disease; subtle lucency of the T5 vertebral body at the site of a previously FDG avid metastasis. Other lesions not clearly appreciated by CT. Nuclear scintigraphic bone scan or repeat PET-CT may be helpful to assess for residual metabolically active osseous metastatic disease. 6. Emphysema and diffuse bilateral bronchial wall thickening. 7. Coronary artery disease. 8. Large left inguinal hernia containing nonobstructed sigmoid colon.   Aortic Atherosclerosis (ICD10-I70.0) and Emphysema (ICD10-J43.9).   02/18/2024 Imaging   CT chest abdomen pelvis w contrast showed 1. Nearly complete resolution of previously seen dependent left lower lobe masses and nodularity. Additional small nonspecific pulmonary nodules unchanged. 2. No persistently enlarged mediastinal or left hilar lymph  nodes. 3. Significantly diminished size of numerous hypodense liver lesions. 4. Constellation of findings is consistent with treatment response of lung malignancy and associated metastatic disease. 5. Very little CT correlate of previously seen FDG avid osseous metastatic disease; subtle lucency of the T5 vertebral body at the site of a previously FDG avid metastasis. Other lesions not clearly appreciated by CT. Nuclear scintigraphic bone scan or repeat PET-CT may be helpful to assess for residual metabolically active osseous metastatic disease. 6. Emphysema and diffuse bilateral bronchial wall thickening. 7. Coronary artery disease. 8. Large left inguinal hernia containing nonobstructed sigmoid colon.   Aortic Atherosclerosis (ICD10-I70.0) and Emphysema (ICD10-J43.9).     Patient denies changes in bowel habits.  Denies unintentional weight loss night sweats or fever. No history of blood transfusions or hepatitis. He is not on any blood thinners but takes aspirin  for preventive purposes.  He has a history of stage 3 renal disease diagnosed three years ago after a blood test revealed elevated potassium levels. Since then, he has made significant lifestyle changes, including stopping alcohol consumption and losing weight. He now maintains a weight between 145 and 151 pounds and walks approximately 5.6 miles daily.  He has a history of skin cancer, treated with resection, and is scheduled for further treatment. His father had prostate cancer.  Patient had a normal PSA in January 2025.   Patient lives at home with wife who has multiple medical problems.  He is the caregiver for her. His daughter passed away last year.  He has granddaughter who lives in [place] .  S/p brain radiation he finished tapering course of steroid.   INTERVAL HISTORY TODDY BOYD is a 83 y.o. male who has above history reviewed by me today presents for follow up visit for treatment of metastatic squamous cell  lung cancer.   + Peripheral neuropathy he takes gabapentin  100 mg 2-3 times a day. Seen by Dr. Buckley.  Weight has been stable.  Fatigue has improved He has no new complaints.  MEDICAL HISTORY:  Past Medical History:  Diagnosis Date   Acute appendicitis 03/03/2018   Arthritis    Cancer (HCC)    skin cancer   Chronic kidney disease, stage 3b (HCC)    Collapsed lung 2019   x2   COPD (chronic obstructive pulmonary disease) (HCC)    Coronary artery disease    Diabetes mellitus without complication (HCC)    diet controlled-lost 60 lbs and is not on any current meds   GERD (gastroesophageal reflux disease)    Gout    Hyperlipidemia    Hypertension    Sleep apnea    uses cpap   Thoracic aortic aneurysm    Vitamin B 12 deficiency     SURGICAL HISTORY: Past Surgical History:  Procedure Laterality Date  CARDIAC CATHETERIZATION     CATARACT EXTRACTION  2012   CHEST TUBE INSERTION     x2   COLONOSCOPY WITH PROPOFOL  N/A 04/14/2021   Procedure: COLONOSCOPY WITH PROPOFOL ;  Surgeon: Maryruth Ole DASEN, MD;  Location: [facility] ENDOSCOPY;  Service: Endoscopy;  Laterality: N/A;   ESOPHAGOGASTRODUODENOSCOPY (EGD) WITH PROPOFOL  N/A 04/14/2021   Procedure: ESOPHAGOGASTRODUODENOSCOPY (EGD) WITH PROPOFOL ;  Surgeon: Maryruth Ole DASEN, MD;  Location: [facility] ENDOSCOPY;  Service: Endoscopy;  Laterality: N/A;   INSERTION OF MESH  09/22/2021   Procedure: INSERTION OF MESH;  Surgeon: Tye Millet, DO;  Location: [facility] ORS;  Service: General;;   JOINT REPLACEMENT     KNEE SURGERY Left 1965   LAPAROSCOPIC APPENDECTOMY N/A 03/03/2018   Procedure: APPENDECTOMY LAPAROSCOPIC;  Surgeon: Nicholaus Selinda Birmingham, MD;  Location: [facility] ORS;  Service: General;  Laterality: N/A;   TOTAL KNEE ARTHROPLASTY Left 09/16/2018   Procedure: TOTAL KNEE ARTHROPLASTY;  Surgeon: Edie Norleen PARAS, MD;  Location: [facility] ORS;  Service: Orthopedics;  Laterality: Left;    SOCIAL HISTORY: Social History   Socioeconomic History   Marital  status: Married    Spouse name: Not on file   Number of children: Not on file   Years of education: Not on file   Highest education level: Not on file  Occupational History   Not on file  Tobacco Use   Smoking status: Former    Current packs/day: 0.00    Average packs/day: 1 pack/day for 40.0 years (40.0 ttl pk-yrs)    Types: Cigarettes    Start date: 06/15/1956    Quit date: 06/15/1996    Years since quitting: 27.9    Passive exposure: Past   Smokeless tobacco: Never  Vaping Use   Vaping status: Never Used  Substance and Sexual Activity   Alcohol use: Not Currently    Alcohol/week: 21.0 standard drinks of alcohol    Types: 21 Shots of liquor per week    Comment: reports last drink 11/03/20   Drug use: No   Sexual activity: Yes  Other Topics Concern   Not on file  Social History Narrative   Not on file   Social Drivers of Health   Financial Resource Strain: Low Risk  (10/30/2023)   Received from [facility]   Overall Financial Resource Strain (CARDIA)    Difficulty of Paying Living Expenses: Not hard at all  Food Insecurity: No Food Insecurity (10/30/2023)   Received from [facility]   Hunger Vital Sign    Within the past 12 months, you worried that your food would run out before you got the money to buy more.: Never true    Within the past 12 months, the food you bought just didn't last and you didn't have money to get more.: Never true  Transportation Needs: No Transportation Needs (10/30/2023)   Received from [facility] - Transportation    In the past 12 months, has lack of transportation kept you from medical appointments or from getting medications?: No    Lack of Transportation (Non-Medical): No  Physical Activity: Sufficiently Active (07/10/2017)   Received from [facility]   Exercise Vital Sign    Days of Exercise per Week: 5 days    Minutes of Exercise per Session: 60 min   Stress: Stress Concern Present (07/10/2017)   Received from [facility] - Occupational Stress Questionnaire    Feeling of  Stress : Rather much  Social Connections: Unknown (07/10/2017)   Received from [facility]   Social Connection and Isolation Panel    Frequency of Communication with Friends and Family: Patient declined    Frequency of Social Gatherings with Friends and Family: Patient declined    Attends Religious Services: Patient declined    Database administrator or Organizations: Patient declined    Attends Engineer, structural: Patient declined    Marital Status: Patient declined  Catering manager Violence: Not on file    FAMILY HISTORY: Family History  Problem Relation Age of Onset   Cancer Father        Unknown   Prostate cancer Father    Lung cancer Neg Hx     ALLERGIES:  has no known allergies.  MEDICATIONS:  Current Outpatient Medications  Medication Sig Dispense Refill   allopurinol  (ZYLOPRIM ) 100 MG tablet Take 100 mg by mouth in the morning and at bedtime.     ALPRAZolam  (XANAX ) 0.5 MG tablet Take 0.5 mg by mouth at bedtime.     amLODipine  (NORVASC ) 2.5 MG tablet Take 2.5 mg by mouth in the morning.     colchicine 0.6 MG tablet Take 2 tablets (1.2mg ) by mouth at first sign of gout flare followed by 1 tablet (0.6mg ) after 1 hour. (Max 1.8mg  within 1 hour)     fenofibrate  (TRICOR ) 145 MG tablet Take 145 mg by mouth 2 (two) times a week. On Wednesday and Sunday     finasteride  (PROSCAR ) 5 MG tablet Take 1 tablet (5 mg total) by mouth daily. 90 tablet 3   furosemide  (LASIX ) 20 MG tablet Take 1 tablet (20 mg total) by mouth daily as needed. 30 tablet 0   gabapentin  (NEURONTIN ) 100 MG capsule 200 mg.     hydrALAZINE  (APRESOLINE ) 25 MG tablet Take 25 mg by mouth 2 (two) times daily.     lansoprazole (PREVACID) 15 MG capsule Take 15 mg by mouth every evening.     oxyCODONE  (OXY  IR/ROXICODONE ) 5 MG immediate release tablet Take 1 tablet (5 mg total) by mouth every 6 (six) hours as needed for severe pain (pain score 7-10) or breakthrough pain. 10 tablet 0   simvastatin  (ZOCOR ) 20 MG tablet Take 1 tablet by mouth 2 (two) times a week. On  Wednesday and Sunday     tamsulosin  (FLOMAX ) 0.4 MG CAPS capsule Take 1 capsule (0.4 mg total) by mouth daily. 30 capsule 11   triamcinolone  ointment (KENALOG ) 0.5 % Apply 1 Application topically 2 (two) times daily. 30 g 0   vitamin B-12 (CYANOCOBALAMIN ) 1000 MCG tablet Take 1,000 mcg by mouth every morning.     Vitamin D, Ergocalciferol, (DRISDOL) 1.25 MG (50000 UNIT) CAPS capsule Take 50,000 Units by mouth.     amoxicillin  (AMOXIL ) 500 MG capsule Take 1 capsule (500 mg total) by mouth 3 (three) times daily. 15 capsule 0   prochlorperazine  (COMPAZINE ) 10 MG tablet Take 1 tablet (10 mg total) by mouth every 6 (six) hours as needed for nausea or vomiting. 30 tablet 1   No current facility-administered medications for this visit.   Facility-Administered Medications Ordered in Other Visits  Medication Dose Route Frequency Provider Last Rate Last Admin   0.9 %  sodium chloride  infusion   Intravenous Continuous Babara Call, MD 10 mL/hr at 02/04/24 0921 New Bag at 02/04/24 0921   heparin  lock flush 100 unit/mL  500 Units Intracatheter Once PRN Babara Call, MD  Review of Systems  Constitutional:  Negative for appetite change, chills, fatigue, fever and unexpected weight change.  HENT:   Negative for hearing loss and voice change.   Eyes:  Negative for eye problems and icterus.  Respiratory:  Negative for chest tightness, cough and shortness of breath.   Cardiovascular:  Negative for chest pain and leg swelling.  Gastrointestinal:  Negative for abdominal distention and abdominal pain.  Endocrine: Negative for hot flashes.  Genitourinary:  Negative for difficulty urinating, dysuria and frequency.   Musculoskeletal:  Negative for  arthralgias.  Skin:  Negative for itching and rash.  Neurological:  Positive for numbness. Negative for light-headedness.  Hematological:  Negative for adenopathy. Does not bruise/bleed easily.  Psychiatric/Behavioral:  Negative for confusion.      PHYSICAL EXAMINATION: ECOG PERFORMANCE STATUS: 1 - Symptomatic but completely ambulatory  Vitals:   05/26/24 0857  BP: (!) 157/70  Pulse: 62  Resp: 20  Temp: 98.7 F (37.1 C)  SpO2: 100%   Filed Weights   05/26/24 0857  Weight: 159 lb (72.1 kg)    Physical Exam Constitutional:      General: He is not in acute distress.    Appearance: He is not diaphoretic.  HENT:     Head: Normocephalic and atraumatic.  Eyes:     General: No scleral icterus. Cardiovascular:     Rate and Rhythm: Normal rate and regular rhythm.     Heart sounds: No murmur heard. Pulmonary:     Effort: Pulmonary effort is normal. No respiratory distress.  Abdominal:     General: Bowel sounds are normal. There is no distension.     Palpations: Abdomen is soft.  Musculoskeletal:        General: Normal range of motion.     Cervical back: Normal range of motion and neck supple.  Skin:    General: Skin is warm and dry.     Findings: No erythema.  Neurological:     Mental Status: He is alert and oriented to person, place, and time. Mental status is at baseline.     Cranial Nerves: No cranial nerve deficit.     Motor: No abnormal muscle tone.  Psychiatric:        Mood and Affect: Mood and affect normal.      LABORATORY DATA:  I have reviewed the data as listed    Latest Ref Rng & Units 05/26/2024    8:41 AM 05/04/2024    1:32 PM 04/08/2024    8:20 AM  CBC  WBC 4.0 - 10.5 K/uL 4.6  5.5  3.8   Hemoglobin 13.0 - 17.0 g/dL 85.3  85.7  87.2   Hematocrit 39.0 - 52.0 % 43.8  43.6  38.7   Platelets 150 - 400 K/uL 202  200  194       Latest Ref Rng & Units 05/26/2024    8:41 AM 05/04/2024    1:32 PM 04/08/2024    8:20 AM  CMP  Glucose 70 - 99 mg/dL 884   869  885   BUN 8 - 23 mg/dL 16  18  13    Creatinine 0.61 - 1.24 mg/dL 9.10  9.03  9.10   Sodium 135 - 145 mmol/L 139  141  138   Potassium 3.5 - 5.1 mmol/L 4.0  4.4  4.0   Chloride 98 - 111 mmol/L 101  106  104   CO2 22 - 32 mmol/L 27  28  27    Calcium 8.9 -  10.3 mg/dL 9.3  9.2  9.1   Total Protein 6.5 - 8.1 g/dL 6.7  6.6  6.2   Total Bilirubin 0.0 - 1.2 mg/dL 0.8  0.7  0.7   Alkaline Phos 38 - 126 U/L 69  81  77   AST 15 - 41 U/L 26  24  23    ALT 0 - 44 U/L 18  17  17       RADIOGRAPHIC STUDIES: I have personally reviewed the radiological images as listed and agreed with the findings in the report. CT CHEST ABDOMEN PELVIS W CONTRAST Result Date: 05/14/2024 CLINICAL DATA:  Lung cancer restaging * Tracking Code: BO * EXAM: CT CHEST, ABDOMEN, AND PELVIS WITH CONTRAST TECHNIQUE: Multidetector CT imaging of the chest, abdomen and pelvis was performed following the standard protocol during bolus administration of intravenous contrast. RADIATION DOSE REDUCTION: This exam was performed according to the departmental dose-optimization program which includes automated exposure control, adjustment of the mA and/or kV according to patient size and/or use of iterative reconstruction technique. CONTRAST:  OMNIPAQUE  IOHEXOL  300 MG/ML  SOLN COMPARISON:  02/18/2024 FINDINGS: CT CHEST FINDINGS Cardiovascular: Aortic atherosclerosis. Normal heart size. Three-vessel coronary artery calcifications. No pericardial effusion. Mediastinum/Nodes: No enlarged mediastinal, hilar, or axillary lymph nodes. Thyroid  gland, trachea, and esophagus demonstrate no significant findings. Lungs/Pleura: Mild centrilobular and paraseptal emphysema. Diffuse bilateral bronchial wall thickening. Continued resolution of dependent left lower lobe mass, now with a bandlike residual no longer discretely measurable (series 4, image 80). Additional tiny bilateral pulmonary nodules unchanged, measuring no greater than 0.4 cm (series 4, image  78). No pleural effusion or pneumothorax. Musculoskeletal: No chest wall abnormality. No acute osseous findings. CT ABDOMEN PELVIS FINDINGS Hepatobiliary: Continued decrease in size of numerous hypodense liver lesions with overlying capsular retraction, index lesion in the right lobe, hepatic segment VIII measuring 1.2 x 1.1 cm, previously 2.4 x 2.2 cm (series 2, image 67). No gallstones, gallbladder wall thickening, or biliary dilatation. Pancreas: Unremarkable. No pancreatic ductal dilatation or surrounding inflammatory changes. Spleen: Normal in size without significant abnormality. Adrenals/Urinary Tract: Adrenal glands are unremarkable. Kidneys are normal, without renal calculi, solid lesion, or hydronephrosis. Bladder is unremarkable. Stomach/Bowel: Stomach is within normal limits. Appendectomy. No evidence of bowel wall thickening, distention, or inflammatory changes. Vascular/Lymphatic: Aortic atherosclerosis. No enlarged abdominal or pelvic lymph nodes. Reproductive: No mass or other abnormality. Other: Large left inguinal hernia containing nonobstructed sigmoid colon. No ascites. Musculoskeletal: No acute osseous findings. Disc degenerative disease and bridging osteophytosis throughout the thoracic spine as well as ankylosis of the bilateral sacroiliac joints. There is again essentially no CT correlate for previously noted FDG avid osseous metastatic disease. IMPRESSION: 1. Continued resolution of dependent left lower lobe mass, now with a bandlike residual no longer discretely measurable. 2. Continued decrease in size of numerous hypodense liver lesions with overlying pseudocirrhotic capsular retraction. 3. There is again essentially no CT correlate for previously noted FDG avid osseous metastatic disease. 4. Constellation of findings is consistent with continued treatment response of lung malignancy and associated metastatic disease. 5. Additional tiny bilateral pulmonary nodules unchanged, measuring no  greater than 0.4 cm, likely benign and incidental. Attention on follow-up. 6. Emphysema and diffuse bilateral bronchial wall thickening. 7. Coronary artery disease. 8. Large left inguinal hernia containing nonobstructed sigmoid colon. Aortic Atherosclerosis (ICD10-I70.0) and Emphysema (ICD10-J43.9). Electronically Signed   By: Marolyn JONETTA Jaksch M.D.   On: 05/14/2024 09:45

## 2024-05-26 NOTE — Progress Notes (Signed)
 Daily Session Note  Patient Details  Name: Tony Mitchell MRN: [id number] Date of Birth: November 21, 1940 Referring Provider:   [name] from 02/27/2024 in [facility] Cardiac and Pulmonary Rehab  Referring Provider Babara Call, MD    Encounter Date: 05/26/2024  Check In:  Session Check In - 05/26/24 1239       Check-In   Supervising physician immediately available to respond to emergencies See telemetry face sheet for immediately available ER MD    Location [facility]-Cardiac & Pulmonary Rehab    Staff Present Rollene Paterson, MS, Exercise Physiologist;Jamerica Snavely, BS, Exercise Physiologist    Virtual Visit No    Medication changes reported     No    Fall or balance concerns reported    No    Warm-up and Cool-down Performed on first and last piece of equipment    Resistance Training Performed Yes    VAD Patient? No    PAD/SET Patient? No      Pain Assessment   Currently in Pain? No/denies    Multiple Pain Sites No             Social History   Tobacco Use  Smoking Status Former   Current packs/day: 0.00   Average packs/day: 1 pack/day for 40.0 years (40.0 ttl pk-yrs)   Types: Cigarettes   Start date: 06/15/1956   Quit date: 06/15/1996   Years since quitting: 27.9   Passive exposure: Past  Smokeless Tobacco Never    Goals Met:  Independence with exercise equipment Exercise tolerated well No report of concerns or symptoms today  Goals Unmet:  Not Applicable  Comments: Pt able to follow exercise prescription today without complaint.  Will continue to monitor for progression.    Dr. Oneil Pinal is Medical Director for [facility] Cardiac Rehabilitation.  Dr. Fuad Aleskerov is Medical Director for [facility] Pulmonary Rehabilitation.

## 2024-05-26 NOTE — Assessment & Plan Note (Signed)
 Gabapentin  was recently increased to 2-3 time daily.  Follow up with neurology- Referred to acupuncture clinic

## 2024-05-26 NOTE — Patient Instructions (Signed)
 [facility] - A DEPT OF [facility]  Discharge Instructions: Thank you for choosing [facility] to provide your oncology and hematology care.  If you have a lab appointment with the Cancer Center, please go directly to the Cancer Center and check in at the registration area.  Wear comfortable clothing and clothing appropriate for easy access to any Portacath or PICC line.   We strive to give you quality time with your provider. You may need to reschedule your appointment if you arrive late (15 or more minutes).  Arriving late affects you and other patients whose appointments are after yours.  Also, if you miss three or more appointments without notifying the office, you may be dismissed from the clinic at the provider's discretion.      For prescription refill requests, have your pharmacy contact our office and allow 72 hours for refills to be completed.    Today you received the following chemotherapy and/or immunotherapy agents Keytruda        To help prevent nausea and vomiting after your treatment, we encourage you to take your nausea medication as directed.  BELOW ARE SYMPTOMS THAT SHOULD BE REPORTED IMMEDIATELY: *FEVER GREATER THAN 100.4 F (38 C) OR HIGHER *CHILLS OR SWEATING *NAUSEA AND VOMITING THAT IS NOT CONTROLLED WITH YOUR NAUSEA MEDICATION *UNUSUAL SHORTNESS OF BREATH *UNUSUAL BRUISING OR BLEEDING *URINARY PROBLEMS (pain or burning when urinating, or frequent urination) *BOWEL PROBLEMS (unusual diarrhea, constipation, pain near the anus) TENDERNESS IN MOUTH AND THROAT WITH OR WITHOUT PRESENCE OF ULCERS (sore throat, sores in mouth, or a toothache) UNUSUAL RASH, SWELLING OR PAIN  UNUSUAL VAGINAL DISCHARGE OR ITCHING   Items with * indicate a potential emergency and should be followed up as soon as possible or go to the Emergency Department if any problems should occur.  Please show the CHEMOTHERAPY ALERT CARD or IMMUNOTHERAPY  ALERT CARD at check-in to the Emergency Department and triage nurse.  Should you have questions after your visit or need to cancel or reschedule your appointment, please contact [facility] - A DEPT OF [facility]  [phone] and follow the prompts.  Office hours are 8:00 a.m. to 4:30 p.m. Monday - Friday. Please note that voicemails left after 4:00 p.m. may not be returned until the following business day.  We are closed weekends and major holidays. You have access to a nurse at all times for urgent questions. Please call the main number to the clinic [phone] and follow the prompts.  For any non-urgent questions, you may also contact your provider using MyChart. We now offer e-Visits for anyone 3 and older to request care online for non-urgent symptoms. For details visit [email or web address].   Also download the MyChart app! Go to the app store, search MyChart, open the app, select [place], and log in with your MyChart username and password.

## 2024-05-26 NOTE — Assessment & Plan Note (Signed)
 S/p Brain Radiation. Repeat brain MRI showed treatment response . He was seen by neurology Dr. Buckley.

## 2024-05-26 NOTE — Assessment & Plan Note (Signed)
 Keytruda  treatments as planned

## 2024-05-26 NOTE — Assessment & Plan Note (Addendum)
 Imaging results and pathology results were reviewed and discussed with patient. 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 NGS showed TMB 53.2 high, PD-L1 <1%, SMARCB1- high TMB, Keytruda  was added on cycle 2 Previously on carboplatin , Taxol  Keytruda . Currently on Keytruda  maintenance CT scan showed continue partial response.   Labs are reviewed and discussed with patient.   continue Keytruda  for maintenance.

## 2024-05-27 LAB — T4: T4, Total: 6.9 ug/dL (ref 4.5–12.0)

## 2024-05-28 ENCOUNTER — Encounter

## 2024-05-28 DIAGNOSIS — C3492 Malignant neoplasm of unspecified part of left bronchus or lung: Secondary | ICD-10-CM

## 2024-05-28 NOTE — Progress Notes (Signed)
 Daily Session Note  Patient Details  Name: Tony Mitchell MRN: [id number] Date of Birth: 01-Mar-1941 Referring Provider:   [name] Exercise from 02/27/2024 in [facility] Cardiac and Pulmonary Rehab  Referring Provider Babara Call, MD    Encounter Date: 05/28/2024  Check In:  Session Check In - 05/28/24 1214       Check-In   Supervising physician immediately available to respond to emergencies See telemetry face sheet for immediately available ER MD    Location [facility]-Cardiac & Pulmonary Rehab    Staff Present Rollene Paterson, MS, Exercise Physiologist;Maxon Conetta BS, Exercise Physiologist    Virtual Visit No    Medication changes reported     No    Fall or balance concerns reported    No    Warm-up and Cool-down Performed on first and last piece of equipment    Resistance Training Performed Yes    VAD Patient? No    PAD/SET Patient? No      Pain Assessment   Currently in Pain? No/denies    Multiple Pain Sites No             Social History   Tobacco Use  Smoking Status Former   Current packs/day: 0.00   Average packs/day: 1 pack/day for 40.0 years (40.0 ttl pk-yrs)   Types: Cigarettes   Start date: 06/15/1956   Quit date: 06/15/1996   Years since quitting: 27.9   Passive exposure: Past  Smokeless Tobacco Never    Goals Met:  Independence with exercise equipment Exercise tolerated well No report of concerns or symptoms today  Goals Unmet:  Not Applicable  Comments: Pt able to follow exercise prescription today without complaint.  Will continue to monitor for progression.    Dr. Oneil Pinal is Medical Director for [facility] Cardiac Rehabilitation.  Dr. Fuad Aleskerov is Medical Director for [facility] Pulmonary Rehabilitation.

## 2024-06-02 ENCOUNTER — Encounter

## 2024-06-02 DIAGNOSIS — C3492 Malignant neoplasm of unspecified part of left bronchus or lung: Secondary | ICD-10-CM

## 2024-06-02 NOTE — Progress Notes (Signed)
 Daily Session Note  Patient Details  Name: Tony Mitchell MRN: [id number] Date of Birth: December 01, 1940 Referring Provider:   [name] Exercise from 02/27/2024 in [facility] Cardiac and Pulmonary Rehab  Referring Provider Babara Call, MD    Encounter Date: 06/02/2024  Check In:  Session Check In - 06/02/24 1243       Check-In   Supervising physician immediately available to respond to emergencies See telemetry face sheet for immediately available ER MD    Location [facility]-Cardiac & Pulmonary Rehab    Staff Present Rollene Paterson, MS, Exercise Physiologist;Ulah Olmo, BS, Exercise Physiologist    Virtual Visit No    Medication changes reported     No    Fall or balance concerns reported    No    Warm-up and Cool-down Performed on first and last piece of equipment    Resistance Training Performed Yes    VAD Patient? No    PAD/SET Patient? No      Pain Assessment   Currently in Pain? No/denies    Multiple Pain Sites No             Social History   Tobacco Use  Smoking Status Former   Current packs/day: 0.00   Average packs/day: 1 pack/day for 40.0 years (40.0 ttl pk-yrs)   Types: Cigarettes   Start date: 06/15/1956   Quit date: 06/15/1996   Years since quitting: 27.9   Passive exposure: Past  Smokeless Tobacco Never    Goals Met:  Independence with exercise equipment Exercise tolerated well No report of concerns or symptoms today  Goals Unmet:  Not Applicable  Comments: Pt able to follow exercise prescription today without complaint.  Will continue to monitor for progression.   Dr. Oneil Pinal is Medical Director for [facility] Cardiac Rehabilitation.  Dr. Fuad Aleskerov is Medical Director for [facility] Pulmonary Rehabilitation.

## 2024-06-03 ENCOUNTER — Ambulatory Visit
Admission: RE | Admit: 2024-06-03 | Discharge: 2024-06-03 | Disposition: A | Discharge status: Home / Self Care | Source: Ambulatory Visit | Attending: Radiation Oncology | Admitting: Radiation Oncology

## 2024-06-03 ENCOUNTER — Telehealth: Payer: Self-pay | Admitting: *Deleted

## 2024-06-03 ENCOUNTER — Encounter: Payer: Self-pay | Admitting: Radiation Oncology

## 2024-06-03 VITALS — BP 169/78 | HR 57 | Temp 98.0°F | Resp 15 | Ht 67.0 in | Wt 157.8 lb

## 2024-06-03 DIAGNOSIS — M1711 Unilateral primary osteoarthritis, right knee: Secondary | ICD-10-CM | POA: Insufficient documentation

## 2024-06-03 DIAGNOSIS — Z510 Encounter for antineoplastic radiation therapy: Secondary | ICD-10-CM | POA: Diagnosis present

## 2024-06-03 NOTE — Progress Notes (Signed)
 Radiation Oncology Follow up Note  Name: AKIEL FENNELL   Date:   06/03/2024 MRN:  [id number] DOB: 19-Jun-1941    This 83 y.o. male presents to the clinic today for osteoarthritis of the right knee.  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  Currently being treated with Taxol  and Keytruda  and is stable.  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  Pain waxes and wanes between a 7-8 on severity level although at times is down to 1 to depending on use and standing position.  He has self-referred for consideration of low-dose radiation for osteoarthritis  COMPLICATIONS OF TREATMENT: none  FOLLOW UP COMPLIANCE: keeps appointments   PHYSICAL EXAM:  BP (!) 169/78   Pulse (!) 57   Temp 98 F (36.7 C)   Resp 15   Ht 5' 7 (1.702 m)   Wt 157 lb 12.8 oz (71.6 kg)   BMI 24.71 kg/m  Range of motion lower extremities does not elicit pain.  Motor and sensory levels are equal and symmetric in the lower extremities.  Well-developed well-nourished patient in NAD. HEENT reveals PERLA, EOMI, discs not visualized.  Oral cavity is clear. No oral mucosal lesions are identified. Neck is clear without evidence of cervical or supraclavicular adenopathy. Lungs are clear to A&P. Cardiac examination is essentially unremarkable with regular rate and rhythm without murmur rub or thrill. Abdomen is benign with no organomegaly or masses noted. Motor sensory and DTR levels are equal and symmetric in the upper and lower extremities. Cranial nerves II through XII are grossly intact. Proprioception is intact. No peripheral adenopathy or edema is identified. No motor or sensory levels are noted. Crude visual fields are within  normal range.  RADIOLOGY RESULTS: No current films for review  PLAN: Present time I have offered low-dose radiation therapy to his right knee for osteoarthritis.  Would plan delivering 3 Gray in 6 fractions.  Risks and benefits of treatment including extremely low dose side effect profile were reviewed with the patient.  He comprehends and recommendations well.  I have set him up for simulation early next week.  He will make a decision over the next week whether to proceed with treatment.  I would like to take this opportunity to thank you for allowing me to participate in the care of your patient.SABRA Marcey Penton, MD

## 2024-06-03 NOTE — Telephone Encounter (Signed)
 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   Please advise if okay to stop gabapentin  and start Lyrica for neuropathy.

## 2024-06-04 ENCOUNTER — Encounter

## 2024-06-04 VITALS — Ht 67.72 in | Wt 158.6 lb

## 2024-06-04 DIAGNOSIS — C3492 Malignant neoplasm of unspecified part of left bronchus or lung: Secondary | ICD-10-CM

## 2024-06-04 NOTE — Telephone Encounter (Signed)
 Dr. Babara recommends for patient to try increasing Gabapentin  to 300mg  2-3 times a day first, prior to switching to Lyrica. Detailed VM left on phone and Mychart message sent.

## 2024-06-04 NOTE — Patient Instructions (Signed)
 Discharge Patient Instructions  Patient Details  Name: Tony Mitchell MRN: [id number] Date of Birth: 1940/10/29 Referring Provider:  Sadie Manna, MD   Number of Visits: 24  Reason for Discharge:  Patient reached a stable level of exercise. Patient independent in their exercise. Patient has met program and personal goals.   Diagnosis:  No diagnosis found.  Initial Exercise Prescription:  Initial Exercise Prescription - 02/27/24 1400       Date of Initial Exercise RX and Referring Provider   Date 02/27/24    Referring Provider Babara Call, MD      Oxygen   Maintain Oxygen Saturation 88% or higher      Recumbant Bike   Level 2    RPM 50    Watts 25    Minutes 15    METs 2.26      NuStep   Level 2    SPM 80    Minutes 15    METs 2.26      REL-XR   Level 1    Speed 50    Minutes 15    METs 2.26      Rower   Level 6    Watts 25    Minutes 15    METs 2.26      Track   Laps 23    Minutes 15    METs 2.25      Prescription Details   Frequency (times per week) 2    Duration Progress to 30 minutes of continuous aerobic without signs/symptoms of physical distress      Intensity   THRR 40-80% of Max Heartrate 100-125    Ratings of Perceived Exertion 11-13    Perceived Dyspnea 0-4      Progression   Progression Continue to progress workloads to maintain intensity without signs/symptoms of physical distress.      Resistance Training   Training Prescription Yes    Weight 4 lb    Reps 10-15          Discharge Exercise Prescription (Final Exercise Prescription Changes):  Exercise Prescription Changes - 04/23/24 1200       Home Exercise Plan   Plans to continue exercise at Home (comment)   [name] plans to continue walking outside 7 days a week ( usually 2+ miles). He also states that he is looking into joiing a community fitness center or buying a rowing machine.   Frequency Add 3 additional days to program exercise sessions.    Initial Home  Exercises Provided 04/23/24          Functional Capacity:  6 Minute Walk     Row Name 02/27/24 1420 06/04/24 1234       6 Minute Walk   Phase Initial Discharge    Distance 1060 feet 1440 feet    Distance % Change -- 36 %    Distance Feet Change -- 380 ft    Walk Time 6 minutes 6 minutes    # of Rest Breaks 0 0    MPH 2.01 2.72    METS 2.26 3.1    RPE 11 11    Perceived Dyspnea  0 0    VO2 Peak 7.92 10.86    Symptoms Yes (comment) No    Comments R knee hurting/aching --    Resting HR 76 bpm 66 bpm    Resting BP 140/60 124/64    Resting Oxygen Saturation  96 % 96 %    Exercise Oxygen Saturation  during  6 min walk 91 % 88 %    Max Ex. HR 100 bpm 120 bpm    Max Ex. BP 160/70 156/62    2 Minute Post BP 132/76 --      Nutrition & Weight - Outcomes:  Pre Biometrics - 02/27/24 1429       Pre Biometrics   Height 5' 7.72 (1.72 m)    Weight 156 lb 3.2 oz (70.9 kg)    BMI (Calculated) 23.95    Single Leg Stand 14 seconds          Post Biometrics - 06/04/24 1236        Post  Biometrics   Height 5' 7.72 (1.72 m)    Weight 158 lb 9.6 oz (71.9 kg)    BMI (Calculated) 24.32    Single Leg Stand 24 seconds

## 2024-06-04 NOTE — Progress Notes (Signed)
 Daily Session Note  Patient Details  Name: MISSAEL FERRARI MRN: [id number] Date of Birth: 1941-05-23 Referring Provider:   [name] & Exercise from 02/27/2024 in [facility] Cardiac and Pulmonary Rehab  Referring Provider Babara Call, MD    Encounter Date: 06/04/2024  Check In:  Session Check In - 06/04/24 1252       Check-In   Supervising physician immediately available to respond to emergencies See telemetry face sheet for immediately available ER MD    Location [facility]-Cardiac & Pulmonary Rehab    Staff Present Rollene Paterson, MS, Exercise Physiologist;Maxon Conetta BS, Exercise Physiologist    Virtual Visit No    Medication changes reported     No    Fall or balance concerns reported    No    Warm-up and Cool-down Performed on first and last piece of equipment    Resistance Training Performed Yes    VAD Patient? No    PAD/SET Patient? No      Pain Assessment   Multiple Pain Sites No             Social History   Tobacco Use  Smoking Status Former   Current packs/day: 0.00   Average packs/day: 1 pack/day for 40.0 years (40.0 ttl pk-yrs)   Types: Cigarettes   Start date: 06/15/1956   Quit date: 06/15/1996   Years since quitting: 27.9   Passive exposure: Past  Smokeless Tobacco Never    Goals Met:  Independence with exercise equipment Exercise tolerated well No report of concerns or symptoms today  Goals Unmet:  Not Applicable  Comments:  Gabrial graduated today from  rehab with 24 sessions completed.  Details of the patient's exercise prescription and what He needs to do in order to continue the prescription and progress were discussed with patient.  Patient was given a copy of prescription and goals.  Patient verbalized understanding. Harding plans to continue to exercise by [place] plans to continue walking outside 7 days a week ( usually 2+ miles). He also states that he is looking into joinng a community fitness center or buying a  rowing machine. He completed his post today and improved by 36%!  6 Minute Walk     Row Name 02/27/24 1420 06/04/24 1234       6 Minute Walk   Phase Initial Discharge    Distance 1060 feet 1440 feet    Distance % Change -- 36 %    Distance Feet Change -- 380 ft    Walk Time 6 minutes 6 minutes    # of Rest Breaks 0 0    MPH 2.01 2.72    METS 2.26 3.1    RPE 11 11    Perceived Dyspnea  0 0    VO2 Peak 7.92 10.86    Symptoms Yes (comment) No    Comments R knee hurting/aching --    Resting HR 76 bpm 66 bpm    Resting BP 140/60 124/64    Resting Oxygen Saturation  96 % 96 %    Exercise Oxygen Saturation  during 6 min walk 91 % 88 %    Max Ex. HR 100 bpm 120 bpm    Max Ex. BP 160/70 156/62    2 Minute Post BP 132/76 --          Dr. Oneil Pinal is Medical Director for [facility] Cardiac Rehabilitation.  Dr. Fuad Aleskerov is Medical Director for [facility] Pulmonary Rehabilitation.

## 2024-06-05 MED ORDER — GABAPENTIN 300 MG PO CAPS: 300.0000 mg | ORAL_CAPSULE | Freq: Three times a day (TID) | ORAL | 2 refills | Status: DC

## 2024-06-05 NOTE — Addendum Note (Signed)
 Addended by: VERDENE GILLS on: 06/05/2024 11:28 AM   Modules accepted: Orders

## 2024-06-05 NOTE — Telephone Encounter (Signed)
 Pt will take current gabapentin  capsules but will need new prescription sent in since he will run out quickly with the increased dose. New prescription sent in for 300mg  TID. Pt made aware.

## 2024-06-10 ENCOUNTER — Ambulatory Visit
Admission: RE | Admit: 2024-06-10 | Discharge: 2024-06-10 | Disposition: A | Discharge status: Home / Self Care | Source: Ambulatory Visit | Attending: Radiation Oncology | Admitting: Radiation Oncology

## 2024-06-10 DIAGNOSIS — Z510 Encounter for antineoplastic radiation therapy: Secondary | ICD-10-CM | POA: Diagnosis not present

## 2024-06-11 DIAGNOSIS — Z510 Encounter for antineoplastic radiation therapy: Secondary | ICD-10-CM | POA: Diagnosis not present

## 2024-06-16 ENCOUNTER — Inpatient Hospital Stay

## 2024-06-16 ENCOUNTER — Inpatient Hospital Stay: Admitting: Oncology

## 2024-06-16 ENCOUNTER — Encounter: Payer: Self-pay | Admitting: Oncology

## 2024-06-16 VITALS — BP 158/74 | HR 72 | Resp 18

## 2024-06-16 VITALS — BP 148/72 | HR 61 | Temp 96.9°F | Resp 18 | Wt 161.5 lb

## 2024-06-16 DIAGNOSIS — M25511 Pain in right shoulder: Secondary | ICD-10-CM | POA: Diagnosis not present

## 2024-06-16 DIAGNOSIS — Z5112 Encounter for antineoplastic immunotherapy: Secondary | ICD-10-CM | POA: Diagnosis not present

## 2024-06-16 DIAGNOSIS — C7931 Secondary malignant neoplasm of brain: Secondary | ICD-10-CM | POA: Diagnosis not present

## 2024-06-16 DIAGNOSIS — C3492 Malignant neoplasm of unspecified part of left bronchus or lung: Secondary | ICD-10-CM | POA: Diagnosis not present

## 2024-06-16 DIAGNOSIS — M25519 Pain in unspecified shoulder: Secondary | ICD-10-CM | POA: Insufficient documentation

## 2024-06-16 DIAGNOSIS — G8929 Other chronic pain: Secondary | ICD-10-CM

## 2024-06-16 DIAGNOSIS — M25512 Pain in left shoulder: Secondary | ICD-10-CM

## 2024-06-16 LAB — CBC WITH DIFFERENTIAL (CANCER CENTER ONLY)
Abs Immature Granulocytes: 0.02 K/uL (ref 0.00–0.07)
Basophils Absolute: 0 K/uL (ref 0.0–0.1)
Basophils Relative: 1 %
Eosinophils Absolute: 0.1 K/uL (ref 0.0–0.5)
Eosinophils Relative: 1 %
HCT: 42.3 % (ref 39.0–52.0)
Hemoglobin: 14.4 g/dL (ref 13.0–17.0)
Immature Granulocytes: 0 %
Lymphocytes Relative: 31 %
Lymphs Abs: 1.9 K/uL (ref 0.7–4.0)
MCH: 30.4 pg (ref 26.0–34.0)
MCHC: 34 g/dL (ref 30.0–36.0)
MCV: 89.4 fL (ref 80.0–100.0)
Monocytes Absolute: 0.5 K/uL (ref 0.1–1.0)
Monocytes Relative: 9 %
Neutro Abs: 3.7 K/uL (ref 1.7–7.7)
Neutrophils Relative %: 58 %
Platelet Count: 227 K/uL (ref 150–400)
RBC: 4.73 MIL/uL (ref 4.22–5.81)
RDW: 12.5 % (ref 11.5–15.5)
WBC Count: 6.3 K/uL (ref 4.0–10.5)
nRBC: 0 % (ref 0.0–0.2)

## 2024-06-16 LAB — CMP (CANCER CENTER ONLY)
ALT: 17 U/L (ref 0–44)
AST: 22 U/L (ref 15–41)
Albumin: 4.1 g/dL (ref 3.5–5.0)
Alkaline Phosphatase: 91 U/L (ref 38–126)
Anion gap: 9 (ref 5–15)
BUN: 20 mg/dL (ref 8–23)
CO2: 26 mmol/L (ref 22–32)
Calcium: 9.1 mg/dL (ref 8.9–10.3)
Chloride: 105 mmol/L (ref 98–111)
Creatinine: 1.04 mg/dL (ref 0.61–1.24)
GFR, Estimated: >60 mL/min (ref >60)
Glucose, Bld: 84 mg/dL (ref 70–99)
Potassium: 4.4 mmol/L (ref 3.5–5.1)
Sodium: 140 mmol/L (ref 135–145)
Total Bilirubin: 0.7 mg/dL (ref 0.0–1.2)
Total Protein: 6.5 g/dL (ref 6.5–8.1)

## 2024-06-16 MED ORDER — SODIUM CHLORIDE 0.9 % IV SOLN
INTRAVENOUS | Status: DC
  Filled 2024-06-16: qty 250

## 2024-06-16 MED ORDER — SODIUM CHLORIDE 0.9 % IV SOLN
200.0000 mg | Freq: Once | INTRAVENOUS | Status: AC
  Administered 2024-06-16: 200 mg via INTRAVENOUS
  Filled 2024-06-16: qty 200

## 2024-06-16 NOTE — Assessment & Plan Note (Signed)
 Shoulder pain and stiffness. Possible arthritis exacerbated by immunotherapy.  Recommend ortho evaluation for local treatments .

## 2024-06-16 NOTE — Progress Notes (Signed)
 Patient tolerated chemotherapy with no complaints voiced.  Side effects with management reviewed with understanding verbalized.  IV site clean and dry with no bruising or swelling noted at site.  Good blood return noted before and after administration of chemotherapy.  Band aid applied.  Patient left in satisfactory condition with VSS and no s/s of distress noted. All follow ups as scheduled.   Tony Mitchell [employer]

## 2024-06-16 NOTE — Patient Instructions (Signed)
 [facility] A DEPT OF [facility]  Discharge Instructions: Thank you for choosing [facility] to provide your oncology and hematology care.  If you have a lab appointment with the Cancer Center, please go directly to the Cancer Center and check in at the registration area.  Wear comfortable clothing and clothing appropriate for easy access to any Portacath or PICC line.   We strive to give you quality time with your provider. You may need to reschedule your appointment if you arrive late (15 or more minutes).  Arriving late affects you and other patients whose appointments are after yours.  Also, if you miss three or more appointments without notifying the office, you may be dismissed from the clinic at the provider's discretion.      For prescription refill requests, have your pharmacy contact our office and allow 72 hours for refills to be completed.    Today you received the following chemotherapy and/or immunotherapy agents Tony Mitchell      To help prevent nausea and vomiting after your treatment, we encourage you to take your nausea medication as directed.  BELOW ARE SYMPTOMS THAT SHOULD BE REPORTED IMMEDIATELY: *FEVER GREATER THAN 100.4 F (38 C) OR HIGHER *CHILLS OR SWEATING *NAUSEA AND VOMITING THAT IS NOT CONTROLLED WITH YOUR NAUSEA MEDICATION *UNUSUAL SHORTNESS OF BREATH *UNUSUAL BRUISING OR BLEEDING *URINARY PROBLEMS (pain or burning when urinating, or frequent urination) *BOWEL PROBLEMS (unusual diarrhea, constipation, pain near the anus) TENDERNESS IN MOUTH AND THROAT WITH OR WITHOUT PRESENCE OF ULCERS (sore throat, sores in mouth, or a toothache) UNUSUAL RASH, SWELLING OR PAIN  UNUSUAL VAGINAL DISCHARGE OR ITCHING   Items with * indicate a potential emergency and should be followed up as soon as possible or go to the Emergency Department if any problems should occur.  Please show the CHEMOTHERAPY ALERT CARD or IMMUNOTHERAPY  ALERT CARD at check-in to the Emergency Department and triage nurse.  Should you have questions after your visit or need to cancel or reschedule your appointment, please contact [facility] - A DEPT OF [facility]  [phone] and follow the prompts.  Office hours are 8:00 a.m. to 4:30 p.m. Monday - Friday. Please note that voicemails left after 4:00 p.m. may not be returned until the following business day.  We are closed weekends and major holidays. You have access to a nurse at all times for urgent questions. Please call the main number to the clinic [phone] and follow the prompts.  For any non-urgent questions, you may also contact your provider using MyChart. We now offer e-Visits for anyone 48 and older to request care online for non-urgent symptoms. For details visit [email or web address].   Also download the MyChart app! Go to the app store, search "MyChart", open the app, select [place], and log in with your MyChart username and password.

## 2024-06-16 NOTE — Assessment & Plan Note (Signed)
 Imaging results and pathology results were reviewed and discussed with patient. 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 NGS showed TMB 53.2 high, PD-L1 <1%, SMARCB1- high TMB, Keytruda  was added on cycle 2 Previously on carboplatin , Taxol  Keytruda . Currently on Keytruda  maintenance CT scan showed continue partial response.   Labs are reviewed and discussed with patient.   continue Keytruda  for maintenance.

## 2024-06-16 NOTE — Assessment & Plan Note (Signed)
 Keytruda  treatments as planned

## 2024-06-16 NOTE — Assessment & Plan Note (Signed)
 S/p Brain Radiation. Repeat brain MRI showed treatment response . He was seen by neurology Dr. Buckley.

## 2024-06-16 NOTE — Progress Notes (Signed)
 Hematology/Mitchell Progress note [phone] [phone]     CHIEF COMPLAINTS/PURPOSE OF CONSULTATION:  Metastatic squamous cell carcinoma  ASSESSMENT & PLAN:   Cancer Staging  Squamous cell carcinoma lung, left (HCC) Staging form: Lung, AJCC V9 - Clinical stage from 12/17/2023: Tony Mitchell, pM1c - Signed by Tony Call, MD on 12/17/2023   Squamous cell carcinoma lung, left [facility]) Imaging results and pathology results were reviewed and discussed with patient. 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 NGS showed TMB 53.2 high, PD-L1 <1%, SMARCB1- high TMB, Keytruda  was added on cycle 2 Previously on carboplatin , Taxol  Keytruda . Currently on Keytruda  maintenance CT scan showed continue partial response.   Labs are reviewed and discussed with patient.   continue Keytruda  for maintenance.       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 Repeat brain MRI showed treatment response . He was seen by neurology Tony Mitchell.   Shoulder pain Shoulder pain and stiffness. Possible arthritis exacerbated by immunotherapy.  Recommend ortho evaluation for local treatments .   No orders of the defined types were placed in this encounter.  Follow up  3 weeks  All questions were answered. The patient knows to Mitchell the clinic with any problems, questions or concerns.  Mitchell Babara, MD, PhD [facility] Hematology Mitchell 06/16/2024    HISTORY OF PRESENTING ILLNESS:  Tony Mitchell 83 y.o. male presents for follow up of stage IV lung SCC  Mitchell History  Squamous cell carcinoma lung, left (HCC)  11/22/2023 Imaging   CT abdomen pelvis w contrast  1. Multiple hepatic metastatic lesions. Correlation with history of known malignancy recommended. 2. Serosal implant along the distal stomach versus  gastrohepatic adenopathy. 3. Thickened appearance of the gastric antrum and pylorus likely related to underdistention. An infiltrative mass is less likely but not excluded. Endoscopy may provide better evaluation. 4. Herniation of Mitchell segment of the sigmoid colon into the left inguinal canal. No bowel obstruction.     12/04/2023 Imaging   PET scan showed  Extensive hypermetabolic neoplastic disease including left lower lobe infiltrative mass extending from the pleura to the hilum with numerous abnormal left-sided hilar and mediastinal nodes.   Additional upper retroperitoneal nodes as well as extensive liver metastases.   Multifocal osseous metastatic disease including the thoracic and lumbar spine, pelvis, right humeral head and left scapula.   Based on the overall appearance and with the patient's laboratory Mitchell abnormality this very well could be Mitchell primary left lower lobe lung lesion such as small cell versus Mitchell neuroendocrine tumor with diffuse metastatic disease.   12/10/2023 Imaging   MRI brain w wo contrast  1. 10 x 9 x 10 mm rounded lesion in the posterior left frontal lobe with surrounding vasogenic edema is consistent with metastatic disease. 2. No other pathologic enhancement is present. 3. Periventricular and scattered subcortical T2 hyperintensities bilaterally are mildly advanced for age. The finding is nonspecific but can be seen in the setting of chronic microvascular ischemia, Mitchell demyelinating process such as multiple sclerosis, vasculitis, complicated migraine headaches, or as the sequelae of Mitchell prior infectious or inflammatory process.   12/17/2023 Initial Diagnosis   Squamous cell carcinoma lung, left (HCC)  He began experiencing abdominal pain for Mitchell week.  Initially presenting as increased gas and stomach growling despite having eaten. The pain was localized between the rib cage and the abdomen and described  as intermittent.  Later the pain had resolved, but he  continues to feel bloated. He has CT abdomen pelvis done which showed liver masses.   Liver biopsy showed  1. Liver, biopsy, right hepatic lesion :      - METASTATIC POORLY DIFFERENTIATED SQUAMOUS CELL CARCINOMA WITH NECROSIS.  Diagnosis Note : Per [facility] Mitchell recent PET scan revealed Mitchell hypermetabolic left lower lobe lung mass with extensive metastatic lesions. The carcinoma is positive for cytokeratin 7, p40 (greater than 50%), and CD56 (subset). Cytokeratin 20, TTF-1,  chromogranin, synaptophysin, and CDX-2 are negative. In the absence of  chromogranin / synaptophysin staining the presence of patchy CD56 staining is interpreted as non-specific. The pattern of immunoreactivity is consistent with metastatic poorly differentiated squamous cell carcinoma.     12/17/2023 Cancer Staging   Staging form: Lung, AJCC V9 - Clinical stage from 12/17/2023: Tony Mitchell, pM1c - Signed by Tony Call, MD on 12/17/2023 Histopathologic type: Squamous cell carcinoma, NOS Stage prefix: Initial diagnosis Laterality: Left Sites of metastasis: Bone, Liver, Brain   12/23/2023 - 12/25/2023 Chemotherapy   Patient is on Treatment Plan : LUNG Carboplatin  + Paclitaxel  q21d     01/14/2024 -  Chemotherapy   Carboplatin  + Paclitaxel  + Pembrolizumab  (200) D1 q21d x 4 cycles / Pembrolizumab  (200) Maintenance D1 q21d       Imaging   CT chest abdomen pelvis w contrast  1. Nearly complete resolution of previously seen dependent left lower lobe masses and nodularity. Additional small nonspecific pulmonary nodules unchanged. 2. No persistently enlarged mediastinal or left hilar lymph nodes. 3. Significantly diminished size of numerous hypodense liver lesions. 4. Constellation of findings is consistent with treatment response of lung malignancy and associated metastatic disease. 5. Very little CT correlate of previously seen FDG avid osseous metastatic disease; subtle lucency of the T5 vertebral body at the site of Mitchell previously FDG avid  metastasis. Other lesions not clearly appreciated by CT. Nuclear scintigraphic bone scan or repeat PET-CT may be helpful to assess for residual metabolically active osseous metastatic disease. 6. Emphysema and diffuse bilateral bronchial wall thickening. 7. Coronary artery disease. 8. Large left inguinal hernia containing nonobstructed sigmoid colon.   Aortic Atherosclerosis (ICD10-I70.0) and Emphysema (ICD10-J43.9).   02/18/2024 Imaging   CT chest abdomen pelvis w contrast showed 1. Nearly complete resolution of previously seen dependent left lower lobe masses and nodularity. Additional small nonspecific pulmonary nodules unchanged. 2. No persistently enlarged mediastinal or left hilar lymph nodes. 3. Significantly diminished size of numerous hypodense liver lesions. 4. Constellation of findings is consistent with treatment response of lung malignancy and associated metastatic disease. 5. Very little CT correlate of previously seen FDG avid osseous metastatic disease; subtle lucency of the T5 vertebral body at the site of Mitchell previously FDG avid metastasis. Other lesions not clearly appreciated by CT. Nuclear scintigraphic bone scan or repeat PET-CT may be helpful to assess for residual metabolically active osseous metastatic disease. 6. Emphysema and diffuse bilateral bronchial wall thickening. 7. Coronary artery disease. 8. Large left inguinal hernia containing nonobstructed sigmoid colon.   Aortic Atherosclerosis (ICD10-I70.0) and Emphysema (ICD10-J43.9).     Patient denies changes in bowel habits.  Denies unintentional weight loss night sweats or fever. No history of blood transfusions or hepatitis. He is not on any blood thinners but takes aspirin  for preventive purposes.  He has Mitchell history of stage 3 renal disease diagnosed three years ago after Mitchell blood test revealed elevated potassium levels. Since then, he has made significant lifestyle  changes, including stopping alcohol  consumption and losing weight. He now maintains Mitchell weight between 145 and 151 pounds and walks approximately 5.6 miles daily.  He has Mitchell history of skin cancer, treated with resection, and is scheduled for further treatment. His father had prostate cancer.  Patient had Mitchell normal PSA in January 2025.   Patient lives at home with wife who has multiple medical problems.  He is the caregiver for her. His daughter passed away last year.  He has granddaughter who lives in [place] .  S/p brain radiation he finished tapering course of steroid.   INTERVAL HISTORY Tony Mitchell is Mitchell 83 y.o. male who has above history reviewed by me today presents for follow up visit for treatment of metastatic squamous cell lung cancer.   + Peripheral neuropathy he takes gabapentin  100 mg 2-3 times Mitchell day. Follows up with neurology  Weight has been stable.  Fatigue has improved + bilateral shoulder pain and stiffness in AM. Tylenol  did not help  MEDICAL HISTORY:  Past Medical History:  Diagnosis Date   Acute appendicitis 03/03/2018   Arthritis    Cancer (HCC)    skin cancer   Chronic kidney disease, stage 3b (HCC)    Collapsed lung 2019   x2   COPD (chronic obstructive pulmonary disease) (HCC)    Coronary artery disease    Diabetes mellitus without complication (HCC)    diet controlled-lost 60 lbs and is not on any current meds   GERD (gastroesophageal reflux disease)    Gout    Hyperlipidemia    Hypertension    Sleep apnea    uses cpap   Thoracic aortic aneurysm    Vitamin B 12 deficiency     SURGICAL HISTORY: Past Surgical History:  Procedure Laterality Date   CARDIAC CATHETERIZATION     CATARACT EXTRACTION  2012   CHEST TUBE INSERTION     x2   COLONOSCOPY WITH PROPOFOL  N/Mitchell 04/14/2021   Procedure: COLONOSCOPY WITH PROPOFOL ;  Surgeon: Maryruth Ole DASEN, MD;  Location: [facility] END[facility]COPY;  Service: Endoscopy;  Laterality: N/Mitchell;   ESOPHAGOGASTRODUODENOSCOPY (EGD) WITH PROPOFOL  N/Mitchell 04/14/2021    Procedure: ESOPHAGOGASTRODUODENOSCOPY (EGD) WITH PROPOFOL ;  Surgeon: Maryruth Ole DASEN, MD;  Location: [facility] ENDOSCOPY;  Service: Endoscopy;  Laterality: N/Mitchell;   INSERTION OF MESH  09/22/2021   Procedure: INSERTION OF MESH;  Surgeon: Tye Millet, DO;  Location: [facility] ORS;  Service: General;;   JOINT REPLACEMENT     KNEE SURGERY Left 1965   LAPAROSCOPIC APPENDECTOMY N/Mitchell 03/03/2018   Procedure: APPENDECTOMY LAPAROSCOPIC;  Surgeon: Nicholaus Selinda Birmingham, MD;  Location: [facility] ORS;  Service: General;  Laterality: N/Mitchell;   TOTAL KNEE ARTHROPLASTY Left 09/16/2018   Procedure: TOTAL KNEE ARTHROPLASTY;  Surgeon: Edie Norleen JINNY, MD;  Location: [facility] ORS;  Service: Orthopedics;  Laterality: Left;    SOCIAL HISTORY: Social History   Socioeconomic History   Marital status: Married    Spouse name: Not on file   Number of children: Not on file   Years of education: Not on file   Highest education level: Not on file  Occupational History   Not on file  Tobacco Use   Smoking status: Former    Current packs/day: 0.00    Average packs/day: 1 pack/day for 40.0 years (40.0 ttl pk-yrs)    Types: Cigarettes    Start date: 06/15/1956    Quit date: 06/15/1996    Years since quitting: 28.0    Passive exposure: Past   Smokeless tobacco: Never  Vaping Use   Vaping status: Never Used  Substance and Sexual Activity   Alcohol use: Not Currently    Alcohol/week: 21.0 standard drinks of alcohol    Types: 21 Shots of liquor per week    Comment: reports last drink 11/03/20   Drug use: No   Sexual activity: Yes  Other Topics Concern   Not on file  Social History Narrative   Not on file   Social Drivers of Health   Financial Resource Strain: Low Risk  (10/30/2023)   Received from [facility]   Overall Financial Resource Strain (CARDIA)    Difficulty of Paying Living Expenses: Not hard at all  Food Insecurity: No Food Insecurity (10/30/2023)   Received from [facility]   Hunger  Vital Sign    Within the past 12 months, you worried that your food would run out before you got the money to buy more.: Never true    Within the past 12 months, the food you bought just didn't last and you didn't have money to get more.: Never true  Transportation Needs: No Transportation Needs (10/30/2023)   Received from [facility] - Transportation    In the past 12 months, has lack of transportation kept you from medical appointments or from getting medications?: No    Lack of Transportation (Non-Medical): No  Physical Activity: Sufficiently Active (07/10/2017)   Received from [facility]   Exercise Vital Sign    Days of Exercise per Week: 5 days    Minutes of Exercise per Session: 60 min  Stress: Stress Concern Present (07/10/2017)   Received from [facility] - Occupational Stress Questionnaire    Feeling of Stress : Rather much  Social Connections: Unknown (07/10/2017)   Received from [facility]   Social Connection and Isolation Panel    Frequency of Communication with Friends and Family: Patient declined    Frequency of Social Gatherings with Friends and Family: Patient declined    Attends Religious Services: Patient declined    Database Administrator or Organizations: Patient declined    Attends Engineer, Structural: Patient declined    Marital Status: Patient declined  Catering Manager Violence: Not on file    FAMILY HISTORY: Family History  Problem Relation Age of Onset   Cancer Father        Unknown   Prostate cancer Father    Lung cancer Neg Hx     ALLERGIES:  has no known allergies.  MEDICATIONS:  Current Outpatient Medications  Medication Sig Dispense Refill   allopurinol  (ZYLOPRIM ) 100 MG tablet Take 100 mg by mouth in the morning and at bedtime.     ALPRAZolam  (XANAX ) 0.5 MG tablet Take 0.5 mg by mouth at bedtime.      amLODipine  (NORVASC ) 2.5 MG tablet Take 2.5 mg by mouth in the morning.     colchicine 0.6 MG tablet Take 2 tablets (1.2mg ) by mouth at first sign of gout flare followed by 1 tablet (0.6mg ) after 1 hour. (Max 1.8mg  within 1 hour)     fenofibrate  (TRICOR ) 145 MG tablet Take 145 mg by mouth 2 (two) times Mitchell week. On Wednesday and Sunday     finasteride  (PROSCAR ) 5 MG tablet Take 1 tablet (5 mg total) by mouth daily. 90 tablet 3   furosemide  (LASIX ) 20 MG tablet Take 1 tablet (20 mg total) by  mouth daily as needed. 30 tablet 0   gabapentin  (NEURONTIN ) 300 MG capsule Take 1 capsule (300 mg total) by mouth 3 (three) times daily. 90 capsule 2   hydrALAZINE  (APRESOLINE ) 25 MG tablet Take 25 mg by mouth 2 (two) times daily.     lansoprazole (PREVACID) 15 MG capsule Take 15 mg by mouth every evening.     oxyCODONE  (OXY IR/ROXICODONE ) 5 MG immediate release tablet Take 1 tablet (5 mg total) by mouth every 6 (six) hours as needed for severe pain (pain score 7-10) or breakthrough pain. 10 tablet 0   simvastatin  (ZOCOR ) 20 MG tablet Take 1 tablet by mouth 2 (two) times Mitchell week. On  Wednesday and Sunday     tamsulosin  (FLOMAX ) 0.4 MG CAPS capsule Take 1 capsule (0.4 mg total) by mouth daily. 30 capsule 11   triamcinolone  ointment (KENALOG ) 0.5 % Apply 1 Application topically 2 (two) times daily. 30 g 0   vitamin B-12 (CYANOCOBALAMIN ) 1000 MCG tablet Take 1,000 mcg by mouth every morning.     Vitamin D, Ergocalciferol, (DRISDOL) 1.25 MG (50000 UNIT) CAPS capsule Take 50,000 Units by mouth.     No current facility-administered medications for this visit.   Facility-Administered Medications Ordered in Other Visits  Medication Dose Route Frequency Provider Last Rate Last Admin   0.9 %  sodium chloride  infusion   Intravenous Continuous Tony Call, MD 10 mL/hr at 02/04/24 0921 New Bag at 02/04/24 0921   0.9 %  sodium chloride  infusion   Intravenous Continuous Tony Call, MD 10 mL/hr at 06/16/24 0953 New Bag at 06/16/24  0953   heparin  lock flush 100 unit/mL  500 Units Intracatheter Once PRN Tony Call, MD        Review of Systems  Constitutional:  Negative for appetite change, chills, fatigue, fever and unexpected weight change.  HENT:   Negative for hearing loss and voice change.   Eyes:  Negative for eye problems and icterus.  Respiratory:  Negative for chest tightness, cough and shortness of breath.   Cardiovascular:  Negative for chest pain and leg swelling.  Gastrointestinal:  Negative for abdominal distention and abdominal pain.  Endocrine: Negative for hot flashes.  Genitourinary:  Negative for difficulty urinating, dysuria and frequency.   Musculoskeletal:  Positive for arthralgias.  Skin:  Negative for itching and rash.  Neurological:  Positive for numbness. Negative for light-headedness.  Hematological:  Negative for adenopathy. Does not bruise/bleed easily.  Psychiatric/Behavioral:  Negative for confusion.      PHYSICAL EXAMINATION: ECOG PERFORMANCE STATUS: 1 - Symptomatic but completely ambulatory  Vitals:   06/16/24 0832 06/16/24 0844  BP: (!) 160/76 (!) 148/72  Pulse: 61   Resp: 18   Temp: (!) 96.9 F (36.1 C)   SpO2: 97%    Filed Weights   06/16/24 0832  Weight: 161 lb 8 oz (73.3 kg)    Physical Exam Constitutional:      General: He is not in acute distress.    Appearance: He is not diaphoretic.  HENT:     Head: Normocephalic and atraumatic.  Eyes:     General: No scleral icterus. Cardiovascular:     Rate and Rhythm: Normal rate and regular rhythm.     Heart sounds: No murmur heard. Pulmonary:     Effort: Pulmonary effort is normal. No respiratory distress.  Abdominal:     General: Bowel sounds are normal. There is no distension.     Palpations: Abdomen is soft.  Musculoskeletal:  General: Normal range of motion.     Cervical back: Normal range of motion and neck supple.  Skin:    General: Skin is warm and dry.     Findings: No erythema.  Neurological:      Mental Status: He is alert and oriented to person, place, and time. Mental status is at baseline.     Cranial Nerves: No cranial nerve deficit.     Motor: No abnormal muscle tone.  Psychiatric:        Mood and Affect: Mood and affect normal.      LABORATORY DATA:  I have reviewed the data as listed    Latest Ref Rng & Units 06/16/2024    8:06 AM 05/26/2024    8:41 AM 05/04/2024    1:32 PM  CBC  WBC 4.0 - 10.5 K/uL 6.3  4.6  5.5   Hemoglobin 13.0 - 17.0 g/dL 85.5  85.3  85.7   Hematocrit 39.0 - 52.0 % 42.3  43.8  43.6   Platelets 150 - 400 K/uL 227  202  200       Latest Ref Rng & Units 06/16/2024    8:06 AM 05/26/2024    8:41 AM 05/04/2024    1:32 PM  CMP  Glucose 70 - 99 mg/dL 84  884  869   BUN 8 - 23 mg/dL 20  16  18    Creatinine 0.61 - 1.24 mg/dL 8.95  9.10  9.03   Sodium 135 - 145 mmol/L 140  139  141   Potassium 3.5 - 5.1 mmol/L 4.4  4.0  4.4   Chloride 98 - 111 mmol/L 105  101  106   CO2 22 - 32 mmol/L 26  27  28    Calcium 8.9 - 10.3 mg/dL 9.1  9.3  9.2   Total Protein 6.5 - 8.1 g/dL 6.5  6.7  6.6   Total Bilirubin 0.0 - 1.2 mg/dL 0.7  0.8  0.7   Alkaline Phos 38 - 126 U/L 91  69  81   AST 15 - 41 U/L 22  26  24    ALT 0 - 44 U/L 17  18  17       RADIOGRAPHIC STUDIES: I have personally reviewed the radiological images as listed and agreed with the findings in the report. No results found.

## 2024-06-17 ENCOUNTER — Telehealth: Payer: Self-pay

## 2024-06-17 NOTE — Telephone Encounter (Signed)
 Patient having shoulder pain possibly from Keytruda . Progress note faxed to [facility]/Dr. Edie, requesting evaluation for local treatments.

## 2024-06-22 ENCOUNTER — Ambulatory Visit

## 2024-06-23 ENCOUNTER — Ambulatory Visit
Admission: RE | Admit: 2024-06-23 | Discharge: 2024-06-23 | Disposition: A | Discharge status: Home / Self Care | Source: Ambulatory Visit | Attending: Radiation Oncology | Admitting: Radiation Oncology

## 2024-06-23 ENCOUNTER — Other Ambulatory Visit: Payer: Self-pay

## 2024-06-23 DIAGNOSIS — M1711 Unilateral primary osteoarthritis, right knee: Secondary | ICD-10-CM | POA: Insufficient documentation

## 2024-06-23 DIAGNOSIS — Z510 Encounter for antineoplastic radiation therapy: Secondary | ICD-10-CM | POA: Diagnosis present

## 2024-06-23 LAB — RAD ONC ARIA SESSION SUMMARY
Course Elapsed Days: 0
Plan Fractions Treated to Date: 1
Plan Prescribed Dose Per Fraction: 0.5 Gy
Plan Total Fractions Prescribed: 6
Plan Total Prescribed Dose: 3 Gy
Reference Point Dosage Given to Date: 0.5 Gy
Reference Point Session Dosage Given: 0.5 Gy
Session Number: 1

## 2024-06-24 ENCOUNTER — Ambulatory Visit

## 2024-06-25 ENCOUNTER — Ambulatory Visit
Admission: RE | Admit: 2024-06-25 | Discharge: 2024-06-25 | Disposition: A | Discharge status: Home / Self Care | Source: Ambulatory Visit | Attending: Radiation Oncology | Admitting: Radiation Oncology

## 2024-06-25 ENCOUNTER — Other Ambulatory Visit: Payer: Self-pay

## 2024-06-25 DIAGNOSIS — Z510 Encounter for antineoplastic radiation therapy: Secondary | ICD-10-CM | POA: Diagnosis not present

## 2024-06-25 LAB — RAD ONC ARIA SESSION SUMMARY
Course Elapsed Days: 2
Plan Fractions Treated to Date: 2
Plan Prescribed Dose Per Fraction: 0.5 Gy
Plan Total Fractions Prescribed: 6
Plan Total Prescribed Dose: 3 Gy
Reference Point Dosage Given to Date: 1 Gy
Reference Point Session Dosage Given: 0.5 Gy
Session Number: 2

## 2024-06-29 ENCOUNTER — Ambulatory Visit
Admission: RE | Admit: 2024-06-29 | Discharge: 2024-06-29 | Disposition: A | Discharge status: Home / Self Care | Source: Ambulatory Visit | Attending: Radiation Oncology | Admitting: Radiation Oncology

## 2024-06-29 ENCOUNTER — Other Ambulatory Visit: Payer: Self-pay

## 2024-06-29 DIAGNOSIS — Z510 Encounter for antineoplastic radiation therapy: Secondary | ICD-10-CM | POA: Diagnosis not present

## 2024-06-29 LAB — RAD ONC ARIA SESSION SUMMARY
Course Elapsed Days: 6
Plan Fractions Treated to Date: 3
Plan Prescribed Dose Per Fraction: 0.5 Gy
Plan Total Fractions Prescribed: 6
Plan Total Prescribed Dose: 3 Gy
Reference Point Dosage Given to Date: 1.5 Gy
Reference Point Session Dosage Given: 0.5 Gy
Session Number: 3

## 2024-07-01 ENCOUNTER — Other Ambulatory Visit: Payer: Self-pay

## 2024-07-01 ENCOUNTER — Ambulatory Visit
Admission: RE | Admit: 2024-07-01 | Discharge: 2024-07-01 | Disposition: A | Discharge status: Home / Self Care | Source: Ambulatory Visit | Attending: Radiation Oncology | Admitting: Radiation Oncology

## 2024-07-01 DIAGNOSIS — Z510 Encounter for antineoplastic radiation therapy: Secondary | ICD-10-CM | POA: Diagnosis not present

## 2024-07-01 LAB — RAD ONC ARIA SESSION SUMMARY
Course Elapsed Days: 8
Plan Fractions Treated to Date: 4
Plan Prescribed Dose Per Fraction: 0.5 Gy
Plan Total Fractions Prescribed: 6
Plan Total Prescribed Dose: 3 Gy
Reference Point Dosage Given to Date: 2 Gy
Reference Point Session Dosage Given: 0.5 Gy
Session Number: 4

## 2024-07-06 ENCOUNTER — Telehealth: Admitting: Hospice and Palliative Medicine

## 2024-07-06 ENCOUNTER — Other Ambulatory Visit: Payer: Self-pay

## 2024-07-06 ENCOUNTER — Ambulatory Visit
Admission: RE | Admit: 2024-07-06 | Discharge: 2024-07-06 | Disposition: A | Source: Ambulatory Visit | Attending: Radiation Oncology | Admitting: Radiation Oncology

## 2024-07-06 DIAGNOSIS — Z510 Encounter for antineoplastic radiation therapy: Secondary | ICD-10-CM | POA: Diagnosis not present

## 2024-07-06 LAB — RAD ONC ARIA SESSION SUMMARY
Course Elapsed Days: 13
Plan Fractions Treated to Date: 5
Plan Prescribed Dose Per Fraction: 0.5 Gy
Plan Total Fractions Prescribed: 6
Plan Total Prescribed Dose: 3 Gy
Reference Point Dosage Given to Date: 2.5 Gy
Reference Point Session Dosage Given: 0.5 Gy
Session Number: 5

## 2024-07-07 ENCOUNTER — Inpatient Hospital Stay (HOSPITAL_BASED_OUTPATIENT_CLINIC_OR_DEPARTMENT_OTHER): Admitting: Oncology

## 2024-07-07 ENCOUNTER — Encounter: Payer: Self-pay | Admitting: Oncology

## 2024-07-07 ENCOUNTER — Inpatient Hospital Stay: Attending: Oncology

## 2024-07-07 ENCOUNTER — Inpatient Hospital Stay: Admitting: Hospice and Palliative Medicine

## 2024-07-07 ENCOUNTER — Inpatient Hospital Stay

## 2024-07-07 VITALS — BP 158/84 | HR 64

## 2024-07-07 VITALS — BP 165/79 | HR 71 | Temp 96.4°F | Resp 18 | Wt 160.3 lb

## 2024-07-07 DIAGNOSIS — C3412 Malignant neoplasm of upper lobe, left bronchus or lung: Secondary | ICD-10-CM | POA: Diagnosis not present

## 2024-07-07 DIAGNOSIS — C7931 Secondary malignant neoplasm of brain: Secondary | ICD-10-CM

## 2024-07-07 DIAGNOSIS — E785 Hyperlipidemia, unspecified: Secondary | ICD-10-CM | POA: Insufficient documentation

## 2024-07-07 DIAGNOSIS — Z79899 Other long term (current) drug therapy: Secondary | ICD-10-CM | POA: Insufficient documentation

## 2024-07-07 DIAGNOSIS — G8929 Other chronic pain: Secondary | ICD-10-CM

## 2024-07-07 DIAGNOSIS — Z5112 Encounter for antineoplastic immunotherapy: Secondary | ICD-10-CM | POA: Diagnosis not present

## 2024-07-07 DIAGNOSIS — C787 Secondary malignant neoplasm of liver and intrahepatic bile duct: Secondary | ICD-10-CM | POA: Diagnosis not present

## 2024-07-07 DIAGNOSIS — C3492 Malignant neoplasm of unspecified part of left bronchus or lung: Secondary | ICD-10-CM

## 2024-07-07 DIAGNOSIS — Z87891 Personal history of nicotine dependence: Secondary | ICD-10-CM | POA: Diagnosis not present

## 2024-07-07 DIAGNOSIS — M109 Gout, unspecified: Secondary | ICD-10-CM | POA: Insufficient documentation

## 2024-07-07 DIAGNOSIS — N1832 Chronic kidney disease, stage 3b: Secondary | ICD-10-CM | POA: Diagnosis not present

## 2024-07-07 DIAGNOSIS — Z515 Encounter for palliative care: Secondary | ICD-10-CM | POA: Diagnosis not present

## 2024-07-07 DIAGNOSIS — M25512 Pain in left shoulder: Secondary | ICD-10-CM | POA: Diagnosis not present

## 2024-07-07 DIAGNOSIS — I10 Essential (primary) hypertension: Secondary | ICD-10-CM | POA: Insufficient documentation

## 2024-07-07 DIAGNOSIS — J449 Chronic obstructive pulmonary disease, unspecified: Secondary | ICD-10-CM | POA: Diagnosis not present

## 2024-07-07 DIAGNOSIS — M25511 Pain in right shoulder: Secondary | ICD-10-CM | POA: Insufficient documentation

## 2024-07-07 DIAGNOSIS — I2510 Atherosclerotic heart disease of native coronary artery without angina pectoris: Secondary | ICD-10-CM | POA: Diagnosis not present

## 2024-07-07 DIAGNOSIS — E538 Deficiency of other specified B group vitamins: Secondary | ICD-10-CM | POA: Insufficient documentation

## 2024-07-07 DIAGNOSIS — M129 Arthropathy, unspecified: Secondary | ICD-10-CM | POA: Diagnosis not present

## 2024-07-07 DIAGNOSIS — G4730 Sleep apnea, unspecified: Secondary | ICD-10-CM | POA: Insufficient documentation

## 2024-07-07 DIAGNOSIS — C7951 Secondary malignant neoplasm of bone: Secondary | ICD-10-CM | POA: Diagnosis not present

## 2024-07-07 DIAGNOSIS — I700 Atherosclerosis of aorta: Secondary | ICD-10-CM | POA: Insufficient documentation

## 2024-07-07 DIAGNOSIS — J439 Emphysema, unspecified: Secondary | ICD-10-CM | POA: Diagnosis not present

## 2024-07-07 LAB — CBC WITH DIFFERENTIAL (CANCER CENTER ONLY)
Abs Immature Granulocytes: 0.02 K/uL (ref 0.00–0.07)
Basophils Absolute: 0 K/uL (ref 0.0–0.1)
Basophils Relative: 0 %
Eosinophils Absolute: 0 K/uL (ref 0.0–0.5)
Eosinophils Relative: 1 %
HCT: 44.2 % (ref 39.0–52.0)
Hemoglobin: 14.6 g/dL (ref 13.0–17.0)
Immature Granulocytes: 0 %
Lymphocytes Relative: 25 %
Lymphs Abs: 1.5 K/uL (ref 0.7–4.0)
MCH: 29.2 pg (ref 26.0–34.0)
MCHC: 33 g/dL (ref 30.0–36.0)
MCV: 88.4 fL (ref 80.0–100.0)
Monocytes Absolute: 0.5 K/uL (ref 0.1–1.0)
Monocytes Relative: 7 %
Neutro Abs: 4.2 K/uL (ref 1.7–7.7)
Neutrophils Relative %: 67 %
Platelet Count: 223 K/uL (ref 150–400)
RBC: 5 MIL/uL (ref 4.22–5.81)
RDW: 13.7 % (ref 11.5–15.5)
WBC Count: 6.2 K/uL (ref 4.0–10.5)
nRBC: 0 % (ref 0.0–0.2)

## 2024-07-07 LAB — CMP (CANCER CENTER ONLY)
ALT: 17 U/L (ref 0–44)
AST: 25 U/L (ref 15–41)
Albumin: 4.4 g/dL (ref 3.5–5.0)
Alkaline Phosphatase: 91 U/L (ref 38–126)
Anion gap: 11 (ref 5–15)
BUN: 18 mg/dL (ref 8–23)
CO2: 22 mmol/L (ref 22–32)
Calcium: 9.3 mg/dL (ref 8.9–10.3)
Chloride: 106 mmol/L (ref 98–111)
Creatinine: 0.96 mg/dL (ref 0.61–1.24)
GFR, Estimated: >60 mL/min (ref >60)
Glucose, Bld: 93 mg/dL (ref 70–99)
Potassium: 4.3 mmol/L (ref 3.5–5.1)
Sodium: 139 mmol/L (ref 135–145)
Total Bilirubin: 0.7 mg/dL (ref 0.0–1.2)
Total Protein: 6.7 g/dL (ref 6.5–8.1)

## 2024-07-07 MED ORDER — SODIUM CHLORIDE 0.9 % IV SOLN
200.0000 mg | Freq: Once | INTRAVENOUS | Status: AC
  Administered 2024-07-07: 200 mg via INTRAVENOUS
  Filled 2024-07-07: qty 8

## 2024-07-07 MED ORDER — TRAMADOL HCL 50 MG PO TABS: 50.0000 mg | ORAL_TABLET | Freq: Two times a day (BID) | ORAL | 0 refills | Status: AC | PRN

## 2024-07-07 MED ORDER — SODIUM CHLORIDE 0.9 % IV SOLN
INTRAVENOUS | Status: DC
  Filled 2024-07-07: qty 250

## 2024-07-07 NOTE — Assessment & Plan Note (Signed)
 Shoulder pain and stiffness. Possible arthritis exacerbated by immunotherapy.  He received steroid injections to both shoulders.

## 2024-07-07 NOTE — Assessment & Plan Note (Signed)
 Keytruda  treatments as planned

## 2024-07-07 NOTE — Progress Notes (Signed)
 Palliative Medicine [facility] at [facility] [phone] Fax:([phone]   Name: Tony Mitchell Date: 07/07/2024 MRN: [id number]  DOB: 1941-03-17  Patient Care Team: Sadie Manna, MD as PCP - General (Internal Medicine) Babara Call, MD as Consulting Physician (Oncology) Verdene Gills, RN as Oncology Nurse Navigator    REASON FOR CONSULTATION: Tony Mitchell is a 83 y.o. 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   SOCIAL HISTORY:     reports that he quit smoking about 28 years ago. His smoking use included cigarettes. He started smoking about 68 years ago. He has a 40 pack-year smoking history. He has been exposed to tobacco smoke. He has never used smokeless tobacco. He reports that he does not currently use alcohol after a past usage of about 21.0 standard drinks of alcohol per week. He reports that he does not use drugs.  Patient is married and lives at home with his wife and two beagles.  His only daughter died in 2023-08-08.  Patient has a granddaughter in [place] .  Patient owned a pool business for 50 years.  ADVANCE DIRECTIVES:  Not on file  CODE STATUS:   PAST MEDICAL HISTORY: Past Medical History:  Diagnosis Date   Acute appendicitis 03/03/2018   Arthritis    Cancer (HCC)    skin cancer   Chronic kidney disease, stage 3b (HCC)    Collapsed lung 2018/08/07   x2   COPD (chronic obstructive pulmonary disease) (HCC)    Coronary artery disease    Diabetes mellitus without complication (HCC)    diet controlled-lost 60 lbs and is not on any current meds   GERD (gastroesophageal reflux disease)    Gout    Hyperlipidemia    Hypertension    Sleep apnea    uses cpap   Thoracic aortic aneurysm    Vitamin B 12 deficiency     PAST SURGICAL HISTORY:  Past Surgical History:  Procedure Laterality Date   CARDIAC  CATHETERIZATION     CATARACT EXTRACTION  August 08, 2011   CHEST TUBE INSERTION     x2   COLONOSCOPY WITH PROPOFOL  N/A 04/14/2021   Procedure: COLONOSCOPY WITH PROPOFOL ;  Surgeon: Maryruth Ole DASEN, MD;  Location: [facility] ENDOSCOPY;  Service: Endoscopy;  Laterality: N/A;   ESOPHAGOGASTRODUODENOSCOPY (EGD) WITH PROPOFOL  N/A 04/14/2021   Procedure: ESOPHAGOGASTRODUODENOSCOPY (EGD) WITH PROPOFOL ;  Surgeon: Maryruth Ole DASEN, MD;  Location: [facility] ENDOSCOPY;  Service: Endoscopy;  Laterality: N/A;   INSERTION OF MESH  09/22/2021   Procedure: INSERTION OF MESH;  Surgeon: Tye Millet, DO;  Location: [facility] ORS;  Service: General;;   JOINT REPLACEMENT     KNEE SURGERY Left 1965   LAPAROSCOPIC APPENDECTOMY N/A 03/03/2018   Procedure: APPENDECTOMY LAPAROSCOPIC;  Surgeon: Nicholaus Selinda Birmingham, MD;  Location: [facility] ORS;  Service: General;  Laterality: N/A;   TOTAL KNEE ARTHROPLASTY Left 09/16/2018   Procedure: TOTAL KNEE ARTHROPLASTY;  Surgeon: Edie Norleen JINNY, MD;  Location: [facility] ORS;  Service: Orthopedics;  Laterality: Left;    HEMATOLOGY/ONCOLOGY HISTORY:  Oncology History  Squamous cell carcinoma lung, left (HCC)  11/22/2023 Imaging   CT abdomen pelvis w contrast  1. Multiple hepatic metastatic lesions. Correlation with history of known malignancy recommended. 2. Serosal implant along the distal stomach versus gastrohepatic adenopathy. 3. Thickened appearance of the gastric antrum and pylorus likely related to underdistention. An infiltrative mass is less likely but not excluded. Endoscopy  may provide better evaluation. 4. Herniation of a segment of the sigmoid colon into the left inguinal canal. No bowel obstruction.     12/04/2023 Imaging   PET scan showed  Extensive hypermetabolic neoplastic disease including left lower lobe infiltrative mass extending from the pleura to the hilum with numerous abnormal left-sided hilar and mediastinal nodes.   Additional upper retroperitoneal nodes as well as extensive  liver metastases.   Multifocal osseous metastatic disease including the thoracic and lumbar spine, pelvis, right humeral head and left scapula.   Based on the overall appearance and with the patient's laboratory a abnormality this very well could be a primary left lower lobe lung lesion such as small cell versus a neuroendocrine tumor with diffuse metastatic disease.   12/10/2023 Imaging   MRI brain w wo contrast  1. 10 x 9 x 10 mm rounded lesion in the posterior left frontal lobe with surrounding vasogenic edema is consistent with metastatic disease. 2. No other pathologic enhancement is present. 3. Periventricular and scattered subcortical T2 hyperintensities bilaterally are mildly advanced for age. The finding is nonspecific but can be seen in the setting of chronic microvascular ischemia, a demyelinating process such as multiple sclerosis, vasculitis, complicated migraine headaches, or as the sequelae of a prior infectious or inflammatory process.   12/17/2023 Initial Diagnosis   Squamous cell carcinoma lung, left (HCC)  He began experiencing abdominal pain for a week.  Initially presenting as increased gas and stomach growling despite having eaten. The pain was localized between the rib cage and the abdomen and described as intermittent.  Later the pain had resolved, but he continues to feel bloated. He has CT abdomen pelvis done which showed liver masses.   Liver biopsy showed  1. Liver, biopsy, right hepatic lesion :      - METASTATIC POORLY DIFFERENTIATED SQUAMOUS CELL CARCINOMA WITH NECROSIS.  Diagnosis Note : Per [facility] a recent PET scan revealed a hypermetabolic left lower lobe lung mass with extensive metastatic lesions. The carcinoma is positive for cytokeratin 7, p40 (greater than 50%), and CD56 (subset). Cytokeratin 20, TTF-1,  chromogranin, synaptophysin, and CDX-2 are negative. In the absence of  chromogranin / synaptophysin staining the presence of patchy CD56 staining  is interpreted as non-specific. The pattern of immunoreactivity is consistent with metastatic poorly differentiated squamous cell carcinoma.     12/17/2023 Cancer Staging   Staging form: Lung, AJCC V9 - Clinical stage from 12/17/2023: [name], pM1c - Signed by Babara Call, MD on 12/17/2023 Histopathologic type: Squamous cell carcinoma, NOS Stage prefix: Initial diagnosis Laterality: Left Sites of metastasis: Bone, Liver, Brain   12/23/2023 - 12/25/2023 Chemotherapy   Patient is on Treatment Plan : LUNG Carboplatin  + Paclitaxel  q21d     01/14/2024 -  Chemotherapy   Carboplatin  + Paclitaxel  + Pembrolizumab  (200) D1 q21d x 4 cycles / Pembrolizumab  (200) Maintenance D1 q21d       Imaging   CT chest abdomen pelvis w contrast  1. Nearly complete resolution of previously seen dependent left lower lobe masses and nodularity. Additional small nonspecific pulmonary nodules unchanged. 2. No persistently enlarged mediastinal or left hilar lymph nodes. 3. Significantly diminished size of numerous hypodense liver lesions. 4. Constellation of findings is consistent with treatment response of lung malignancy and associated metastatic disease. 5. Very little CT correlate of previously seen FDG avid osseous metastatic disease; subtle lucency of the T5 vertebral body at the site of a previously FDG avid metastasis. Other lesions not clearly appreciated by CT. Nuclear  scintigraphic bone scan or repeat PET-CT may be helpful to assess for residual metabolically active osseous metastatic disease. 6. Emphysema and diffuse bilateral bronchial wall thickening. 7. Coronary artery disease. 8. Large left inguinal hernia containing nonobstructed sigmoid colon.   Aortic Atherosclerosis (ICD10-I70.0) and Emphysema (ICD10-J43.9).   02/18/2024 Imaging   CT chest abdomen pelvis w contrast showed 1. Nearly complete resolution of previously seen dependent left lower lobe masses and nodularity. Additional small  nonspecific pulmonary nodules unchanged. 2. No persistently enlarged mediastinal or left hilar lymph nodes. 3. Significantly diminished size of numerous hypodense liver lesions. 4. Constellation of findings is consistent with treatment response of lung malignancy and associated metastatic disease. 5. Very little CT correlate of previously seen FDG avid osseous metastatic disease; subtle lucency of the T5 vertebral body at the site of a previously FDG avid metastasis. Other lesions not clearly appreciated by CT. Nuclear scintigraphic bone scan or repeat PET-CT may be helpful to assess for residual metabolically active osseous metastatic disease. 6. Emphysema and diffuse bilateral bronchial wall thickening. 7. Coronary artery disease. 8. Large left inguinal hernia containing nonobstructed sigmoid colon.   Aortic Atherosclerosis (ICD10-I70.0) and Emphysema (ICD10-J43.9).     ALLERGIES:  has no known allergies.  MEDICATIONS:  Current Outpatient Medications  Medication Sig Dispense Refill   allopurinol  (ZYLOPRIM ) 100 MG tablet Take 100 mg by mouth in the morning and at bedtime.     ALPRAZolam  (XANAX ) 0.5 MG tablet Take 0.5 mg by mouth at bedtime.     amLODipine  (NORVASC ) 2.5 MG tablet Take 2.5 mg by mouth in the morning.     colchicine 0.6 MG tablet Take 2 tablets (1.2mg ) by mouth at first sign of gout flare followed by 1 tablet (0.6mg ) after 1 hour. (Max 1.8mg  within 1 hour)     fenofibrate  (TRICOR ) 145 MG tablet Take 145 mg by mouth 2 (two) times a week. On Wednesday and Sunday     finasteride  (PROSCAR ) 5 MG tablet Take 1 tablet (5 mg total) by mouth daily. 90 tablet 3   furosemide  (LASIX ) 20 MG tablet Take 1 tablet (20 mg total) by mouth daily as needed. 30 tablet 0   gabapentin  (NEURONTIN ) 300 MG capsule Take 1 capsule (300 mg total) by mouth 3 (three) times daily. 90 capsule 2   hydrALAZINE  (APRESOLINE ) 25 MG tablet Take 25 mg by mouth 2 (two) times daily.     lansoprazole  (PREVACID) 15 MG capsule Take 15 mg by mouth every evening.     oxyCODONE  (OXY IR/ROXICODONE ) 5 MG immediate release tablet Take 1 tablet (5 mg total) by mouth every 6 (six) hours as needed for severe pain (pain score 7-10) or breakthrough pain. 10 tablet 0   simvastatin  (ZOCOR ) 20 MG tablet Take 1 tablet by mouth 2 (two) times a week. On  Wednesday and Sunday     tamsulosin  (FLOMAX ) 0.4 MG CAPS capsule Take 1 capsule (0.4 mg total) by mouth daily. 30 capsule 11   triamcinolone  ointment (KENALOG ) 0.5 % Apply 1 Application topically 2 (two) times daily. 30 g 0   vitamin B-12 (CYANOCOBALAMIN ) 1000 MCG tablet Take 1,000 mcg by mouth every morning.     Vitamin D, Ergocalciferol, (DRISDOL) 1.25 MG (50000 UNIT) CAPS capsule Take 50,000 Units by mouth.     No current facility-administered medications for this visit.   Facility-Administered Medications Ordered in Other Visits  Medication Dose Route Frequency Provider Last Rate Last Admin   0.9 %  sodium chloride  infusion   Intravenous Continuous  Babara Call, MD 10 mL/hr at 02/04/24 0921 New Bag at 02/04/24 0921   0.9 %  sodium chloride  infusion   Intravenous Continuous Babara Call, MD   Stopped at 07/07/24 1101   heparin  lock flush 100 unit/mL  500 Units Intracatheter Once PRN Babara Call, MD        VITAL SIGNS: There were no vitals taken for this visit. There were no vitals filed for this visit.  Estimated body mass index is 24.58 kg/m as calculated from the following:   Height as of 06/04/24: 5' 7.72 (1.72 m).   Weight as of an earlier encounter on 07/07/24: 160 lb 4.8 oz (72.7 kg).  LABS: CBC:    Component Value Date/Time   WBC 6.2 07/07/2024 0845   WBC 6.0 12/12/2023 1015   HGB 14.6 07/07/2024 0845   HGB 13.3 05/27/2014 1712   HCT 44.2 07/07/2024 0845   HCT 40.9 05/27/2014 1712   PLT 223 07/07/2024 0845   PLT 198 05/27/2014 1712   MCV 88.4 07/07/2024 0845   MCV 92 05/27/2014 1712   NEUTROABS 4.2 07/07/2024 0845   LYMPHSABS 1.5 07/07/2024  0845   MONOABS 0.5 07/07/2024 0845   EOSABS 0.0 07/07/2024 0845   BASOSABS 0.0 07/07/2024 0845   Comprehensive Metabolic Panel:    Component Value Date/Time   NA 139 07/07/2024 0845   NA 140 05/27/2014 1712   K 4.3 07/07/2024 0845   K 3.8 05/27/2014 1712   CL 106 07/07/2024 0845   CL 105 05/27/2014 1712   CO2 22 07/07/2024 0845   CO2 28 05/27/2014 1712   BUN 18 07/07/2024 0845   BUN 11 05/27/2014 1712   CREATININE 0.96 07/07/2024 0845   CREATININE 1.02 05/27/2014 1712   GLUCOSE 93 07/07/2024 0845   GLUCOSE 120 (H) 05/27/2014 1712   CALCIUM 9.3 07/07/2024 0845   CALCIUM 8.2 (L) 05/27/2014 1712   AST 25 07/07/2024 0845   ALT 17 07/07/2024 0845   ALKPHOS 91 07/07/2024 0845   BILITOT 0.7 07/07/2024 0845   PROT 6.7 07/07/2024 0845   ALBUMIN 4.4 07/07/2024 0845    RADIOGRAPHIC STUDIES: No results found.   PERFORMANCE STATUS (ECOG) : 1 - Symptomatic but completely ambulatory  Review of Systems Unless otherwise noted, a complete review of systems is negative.  Physical Exam General: NAD Pulmonary: Unlabored Extremities: no edema, no joint deformities Skin: no rashes Neurological: nonfocal  IMPRESSION: Follow-up visit.  Patient seen in infusion.  Patient reports he is doing reasonably well.  Denies significant changes or concerns.  Patient recently saw Ortho with injections to bilateral shoulders.  His right shoulder pain improved significantly postinjection but he says his left shoulder remains painful.  He is having difficulty sleeping.  Patient taking acetaminophen  but it is ineffective.  Will send Rx for tramadol  as needed for pain.  MOST Form reviewed.  Patient traveling to visit family for Thanksgiving.  PLAN: - Continue current scope of treatment - Start tramadol  50 mg every 12 hours as needed #30 - MOST Form previously reviewed - Follow-up telephone visit 1-2 months  Patient expressed understanding and was in agreement with this plan. He also  understands that He can call the clinic at any time with any questions, concerns, or complaints.     Time Total: 15 minutes  Visit consisted of counseling and education dealing with the complex and emotionally intense issues of symptom management and palliative care in the setting of serious and potentially life-threatening illness.Greater than 50%  of this time  was spent counseling and coordinating care related to the above assessment and plan.  Signed by: Fonda Mower, PhD, NP-C

## 2024-07-07 NOTE — Progress Notes (Signed)
 Hematology/Oncology Progress note [phone] [phone]     CHIEF COMPLAINTS/PURPOSE OF CONSULTATION:  Metastatic squamous cell carcinoma  ASSESSMENT & PLAN:   Cancer Staging  Squamous cell carcinoma lung, left (HCC) Staging form: Lung, AJCC V9 - Clinical stage from 12/17/2023: Tony Mitchell, pM1c - Signed by Babara Call, MD on 12/17/2023   Squamous cell carcinoma lung, left [facility]) Imaging results and pathology results were reviewed and discussed with patient. 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 NGS showed TMB 53.2 high, PD-L1 <1%, SMARCB1- high TMB, Keytruda  was added on cycle 2 Previously on carboplatin , Taxol  Keytruda . Currently on Keytruda  maintenance September 2025 CT scan showed continue partial response.   Labs are reviewed and discussed with patient.   continue Keytruda  for maintenance.   Next CT - plan in Jan 2026    Encounter for antineoplastic immunotherapy Keytruda  treatments as planned  Metastasis to brain [facility]) S/p Brain Radiation. Repeat brain MRI showed treatment response . He was seen by neurology Dr. Buckley.  - plan to repeat MRI brain in Jan 2026  Shoulder pain Shoulder pain and stiffness. Possible arthritis exacerbated by immunotherapy.  He received steroid injections to both shoulders.   No orders of the defined types were placed in this encounter.  Follow up  3 weeks  All questions were answered. The patient knows to call the clinic with any problems, questions or concerns.  Call Babara, MD, PhD [facility] Hematology Oncology 07/07/2024    HISTORY OF PRESENTING ILLNESS:  Tony Mitchell 83 y.o. male presents for follow up of stage IV lung SCC  Oncology History  Squamous cell carcinoma lung, left (HCC)  11/22/2023 Imaging   CT abdomen pelvis w contrast  1. Multiple hepatic metastatic lesions. Correlation with history of known malignancy  recommended. 2. Serosal implant along the distal stomach versus gastrohepatic adenopathy. 3. Thickened appearance of the gastric antrum and pylorus likely related to underdistention. An infiltrative mass is less likely but not excluded. Endoscopy may provide better evaluation. 4. Herniation of a segment of the sigmoid colon into the left inguinal canal. No bowel obstruction.     12/04/2023 Imaging   PET scan showed  Extensive hypermetabolic neoplastic disease including left lower lobe infiltrative mass extending from the pleura to the hilum with numerous abnormal left-sided hilar and mediastinal nodes.   Additional upper retroperitoneal nodes as well as extensive liver metastases.   Multifocal osseous metastatic disease including the thoracic and lumbar spine, pelvis, right humeral head and left scapula.   Based on the overall appearance and with the patient's laboratory a abnormality this very well could be a primary left lower lobe lung lesion such as small cell versus a neuroendocrine tumor with diffuse metastatic disease.   12/10/2023 Imaging   MRI brain w wo contrast  1. 10 x 9 x 10 mm rounded lesion in the posterior left frontal lobe with surrounding vasogenic edema is consistent with metastatic disease. 2. No other pathologic enhancement is present. 3. Periventricular and scattered subcortical T2 hyperintensities bilaterally are mildly advanced for age. The finding is nonspecific but can be seen in the setting of chronic microvascular ischemia, a demyelinating process such as multiple sclerosis, vasculitis, complicated migraine headaches, or as the sequelae of a prior infectious or inflammatory process.   12/17/2023 Initial Diagnosis   Squamous cell carcinoma lung, left (HCC)  He began experiencing abdominal pain for a week.  Initially presenting as increased gas and stomach  growling despite having eaten. The pain was localized between the rib cage and the abdomen and  described as intermittent.  Later the pain had resolved, but he continues to feel bloated. He has CT abdomen pelvis done which showed liver masses.   Liver biopsy showed  1. Liver, biopsy, right hepatic lesion :      - METASTATIC POORLY DIFFERENTIATED SQUAMOUS CELL CARCINOMA WITH NECROSIS.  Diagnosis Note : Per [facility] a recent PET scan revealed a hypermetabolic left lower lobe lung mass with extensive metastatic lesions. The carcinoma is positive for cytokeratin 7, p40 (greater than 50%), and CD56 (subset). Cytokeratin 20, TTF-1,  chromogranin, synaptophysin, and CDX-2 are negative. In the absence of  chromogranin / synaptophysin staining the presence of patchy CD56 staining is interpreted as non-specific. The pattern of immunoreactivity is consistent with metastatic poorly differentiated squamous cell carcinoma.     12/17/2023 Cancer Staging   Staging form: Lung, AJCC V9 - Clinical stage from 12/17/2023: Tony Mitchell, pM1c - Signed by Babara Call, MD on 12/17/2023 Histopathologic type: Squamous cell carcinoma, NOS Stage prefix: Initial diagnosis Laterality: Left Sites of metastasis: Bone, Liver, Brain   12/23/2023 - 12/25/2023 Chemotherapy   Patient is on Treatment Plan : LUNG Carboplatin  + Paclitaxel  q21d     01/14/2024 -  Chemotherapy   Carboplatin  + Paclitaxel  + Pembrolizumab  (200) D1 q21d x 4 cycles / Pembrolizumab  (200) Maintenance D1 q21d       Imaging   CT chest abdomen pelvis w contrast  1. Nearly complete resolution of previously seen dependent left lower lobe masses and nodularity. Additional small nonspecific pulmonary nodules unchanged. 2. No persistently enlarged mediastinal or left hilar lymph nodes. 3. Significantly diminished size of numerous hypodense liver lesions. 4. Constellation of findings is consistent with treatment response of lung malignancy and associated metastatic disease. 5. Very little CT correlate of previously seen FDG avid osseous metastatic disease; subtle  lucency of the T5 vertebral body at the site of a previously FDG avid metastasis. Other lesions not clearly appreciated by CT. Nuclear scintigraphic bone scan or repeat PET-CT may be helpful to assess for residual metabolically active osseous metastatic disease. 6. Emphysema and diffuse bilateral bronchial wall thickening. 7. Coronary artery disease. 8. Large left inguinal hernia containing nonobstructed sigmoid colon.   Aortic Atherosclerosis (ICD10-I70.0) and Emphysema (ICD10-J43.9).   02/18/2024 Imaging   CT chest abdomen pelvis w contrast showed 1. Nearly complete resolution of previously seen dependent left lower lobe masses and nodularity. Additional small nonspecific pulmonary nodules unchanged. 2. No persistently enlarged mediastinal or left hilar lymph nodes. 3. Significantly diminished size of numerous hypodense liver lesions. 4. Constellation of findings is consistent with treatment response of lung malignancy and associated metastatic disease. 5. Very little CT correlate of previously seen FDG avid osseous metastatic disease; subtle lucency of the T5 vertebral body at the site of a previously FDG avid metastasis. Other lesions not clearly appreciated by CT. Nuclear scintigraphic bone scan or repeat PET-CT may be helpful to assess for residual metabolically active osseous metastatic disease. 6. Emphysema and diffuse bilateral bronchial wall thickening. 7. Coronary artery disease. 8. Large left inguinal hernia containing nonobstructed sigmoid colon.   Aortic Atherosclerosis (ICD10-I70.0) and Emphysema (ICD10-J43.9).     Patient denies changes in bowel habits.  Denies unintentional weight loss night sweats or fever. No history of blood transfusions or hepatitis. He is not on any blood thinners but takes aspirin  for preventive purposes.  He has a history of stage 3 renal disease diagnosed three  years ago after a blood test revealed elevated potassium levels. Since then,  he has made significant lifestyle changes, including stopping alcohol consumption and losing weight. He now maintains a weight between 145 and 151 pounds and walks approximately 5.6 miles daily.  He has a history of skin cancer, treated with resection, and is scheduled for further treatment. His father had prostate cancer.  Patient had a normal PSA in January 2025.   Patient lives at home with wife who has multiple medical problems.  He is the caregiver for her. His daughter passed away last year.  He has granddaughter who lives in [place] .  S/p brain radiation he finished tapering course of steroid.   INTERVAL HISTORY Tony Mitchell is a 83 y.o. male who has above history reviewed by me today presents for follow up visit for treatment of metastatic squamous cell lung cancer.   + Peripheral neuropathy he takes gabapentin  100 mg 2-3 times a day. Follows up with neurology  Weight has been stable.   + bilateral shoulder pain and stiffness in AM. S/p steroid injections to both shoulders, right shoulder pain has improved.  MEDICAL HISTORY:  Past Medical History:  Diagnosis Date   Acute appendicitis 03/03/2018   Arthritis    Cancer (HCC)    skin cancer   Chronic kidney disease, stage 3b (HCC)    Collapsed lung 2019   x2   COPD (chronic obstructive pulmonary disease) (HCC)    Coronary artery disease    Diabetes mellitus without complication (HCC)    diet controlled-lost 60 lbs and is not on any current meds   GERD (gastroesophageal reflux disease)    Gout    Hyperlipidemia    Hypertension    Sleep apnea    uses cpap   Thoracic aortic aneurysm    Vitamin B 12 deficiency     SURGICAL HISTORY: Past Surgical History:  Procedure Laterality Date   CARDIAC CATHETERIZATION     CATARACT EXTRACTION  2012   CHEST TUBE INSERTION     x2   COLONOSCOPY WITH PROPOFOL  N/A 04/14/2021   Procedure: COLONOSCOPY WITH PROPOFOL ;  Surgeon: Maryruth Ole DASEN, MD;  Location: [facility];  Service:  Endoscopy;  Laterality: N/A;   ESOPHAGOGASTRODUODENOSCOPY (EGD) WITH PROPOFOL  N/A 04/14/2021   Procedure: ESOPHAGOGASTRODUODENOSCOPY (EGD) WITH PROPOFOL ;  Surgeon: Maryruth Ole DASEN, MD;  Location: [facility];  Service: Endoscopy;  Laterality: N/A;   INSERTION OF MESH  09/22/2021   Procedure: INSERTION OF MESH;  Surgeon: Tye Millet, DO;  Location: [facility] OR[facility];  Service: General;;   JOINT REPLACEMENT     KNEE SURGERY Left 1965   LAPAROSCOPIC APPENDECTOMY N/A 03/03/2018   Procedure: APPENDECTOMY LAPAROSCOPIC;  Surgeon: Nicholaus Selinda Birmingham, MD;  Location: [facility] ORS;  Service: General;  Laterality: N/A;   TOTAL KNEE ARTHROPLASTY Left 09/16/2018   Procedure: TOTAL KNEE ARTHROPLASTY;  Surgeon: Edie Norleen JINNY, MD;  Location: [facility] OR[facility];  Service: Orthopedics;  Laterality: Left;    SOCIAL HISTORY: Social History   Socioeconomic History   Marital status: Married    Spouse name: Not on file   Number of children: Not on file   Years of education: Not on file   Highest education level: Not on file  Occupational History   Not on file  Tobacco Use   Smoking status: Former    Current packs/day: 0.00    Average packs/day: 1 pack/day for 40.0 years (40.0 ttl pk-yrs)    Types: Cigarettes    Start date: 06/15/1956  Quit date: 06/15/1996    Years since quitting: 28.0    Passive exposure: Past   Smokeless tobacco: Never  Vaping Use   Vaping status: Never Used  Substance and Sexual Activity   Alcohol use: Not Currently    Alcohol/week: 21.0 standard drinks of alcohol    Types: 21 Shots of liquor per week    Comment: reports last drink 11/03/20   Drug use: No   Sexual activity: Yes  Other Topics Concern   Not on file  Social History Narrative   Not on file   Social Drivers of Health   Financial Resource Strain: Low Risk  (10/30/2023)   Received from [facility]   Overall Financial Resource Strain (CARDIA)    Difficulty of Paying Living Expenses: Not hard at all  Food  Insecurity: No Food Insecurity (10/30/2023)   Received from [facility]   Hunger Vital Sign    Within the past 12 months, you worried that your food would run out before you got the money to buy more.: Never true    Within the past 12 months, the food you bought just didn't last and you didn't have money to get more.: Never true  Transportation Needs: No Transportation Needs (10/30/2023)   Received from [facility] - Transportation    In the past 12 months, has lack of transportation kept you from medical appointments or from getting medications?: No    Lack of Transportation (Non-Medical): No  Physical Activity: Sufficiently Active (07/10/2017)   Received from [facility]   Exercise Vital Sign    Days of Exercise per Week: 5 days    Minutes of Exercise per Session: 60 min  Stress: Stress Concern Present (07/10/2017)   Received from [facility] - Occupational Stress Questionnaire    Feeling of Stress : Rather much  Social Connections: Unknown (07/10/2017)   Received from [facility]   Social Connection and Isolation Panel    Frequency of Communication with Friends and Family: Patient declined    Frequency of Social Gatherings with Friends and Family: Patient declined    Attends Religious Services: Patient declined    Database Administrator or Organizations: Patient declined    Attends Engineer, Structural: Patient declined    Marital Status: Patient declined  Catering Manager Violence: Not on file    FAMILY HISTORY: Family History  Problem Relation Age of Onset   Cancer Father        Unknown   Prostate cancer Father    Lung cancer Neg Hx     ALLERGIES:  has no known allergies.  MEDICATIONS:  Current Outpatient Medications  Medication Sig Dispense Refill   allopurinol  (ZYLOPRIM ) 100 MG tablet Take 100 mg by mouth in the  morning and at bedtime.     ALPRAZolam  (XANAX ) 0.5 MG tablet Take 0.5 mg by mouth at bedtime.     amLODipine  (NORVASC ) 2.5 MG tablet Take 2.5 mg by mouth in the morning.     colchicine 0.6 MG tablet Take 2 tablets (1.2mg ) by mouth at first sign of gout flare followed by 1 tablet (0.6mg ) after 1 hour. (Max 1.8mg  within 1 hour)     fenofibrate  (TRICOR ) 145 MG tablet Take 145 mg by mouth 2 (two) times a week. On Wednesday and Sunday     finasteride  (PROSCAR ) 5 MG tablet Take 1 tablet (5  mg total) by mouth daily. 90 tablet 3   furosemide  (LASIX ) 20 MG tablet Take 1 tablet (20 mg total) by mouth daily as needed. 30 tablet 0   gabapentin  (NEURONTIN ) 300 MG capsule Take 1 capsule (300 mg total) by mouth 3 (three) times daily. 90 capsule 2   hydrALAZINE  (APRESOLINE ) 25 MG tablet Take 25 mg by mouth 2 (two) times daily.     lansoprazole (PREVACID) 15 MG capsule Take 15 mg by mouth every evening.     simvastatin  (ZOCOR ) 20 MG tablet Take 1 tablet by mouth 2 (two) times a week. On  Wednesday and Sunday     tamsulosin  (FLOMAX ) 0.4 MG CAPS capsule Take 1 capsule (0.4 mg total) by mouth daily. 30 capsule 11   triamcinolone  ointment (KENALOG ) 0.5 % Apply 1 Application topically 2 (two) times daily. 30 g 0   vitamin B-12 (CYANOCOBALAMIN ) 1000 MCG tablet Take 1,000 mcg by mouth every morning.     Vitamin D, Ergocalciferol, (DRISDOL) 1.25 MG (50000 UNIT) CAPS capsule Take 50,000 Units by mouth.     traMADol  (ULTRAM ) 50 MG tablet Take 1 tablet (50 mg total) by mouth every 12 (twelve) hours as needed. 30 tablet 0   No current facility-administered medications for this visit.   Facility-Administered Medications Ordered in Other Visits  Medication Dose Route Frequency Provider Last Rate Last Admin   0.9 %  sodium chloride  infusion   Intravenous Continuous Babara Call, MD 10 mL/hr at 02/04/24 0921 New Bag at 02/04/24 0921   0.9 %  sodium chloride  infusion   Intravenous Continuous Babara Call, MD   Stopped at 07/07/24 1101    heparin  lock flush 100 unit/mL  500 Units Intracatheter Once PRN Babara Call, MD        Review of Systems  Constitutional:  Negative for appetite change, chills, fatigue, fever and unexpected weight change.  HENT:   Negative for hearing loss and voice change.   Eyes:  Negative for eye problems and icterus.  Respiratory:  Negative for chest tightness, cough and shortness of breath.   Cardiovascular:  Negative for chest pain and leg swelling.  Gastrointestinal:  Negative for abdominal distention and abdominal pain.  Endocrine: Negative for hot flashes.  Genitourinary:  Negative for difficulty urinating, dysuria and frequency.   Musculoskeletal:  Positive for arthralgias.  Skin:  Negative for itching and rash.  Neurological:  Positive for numbness. Negative for light-headedness.  Hematological:  Negative for adenopathy. Does not bruise/bleed easily.  Psychiatric/Behavioral:  Negative for confusion.      PHYSICAL EXAMINATION: ECOG PERFORMANCE STATUS: 1 - Symptomatic but completely ambulatory  Vitals:   07/07/24 0857 07/07/24 0908  BP: (!) 192/95 (!) 165/79  Pulse: 71   Resp: 18   Temp: (!) 96.4 F (35.8 C)   SpO2: 97%    Filed Weights   07/07/24 0857  Weight: 160 lb 4.8 oz (72.7 kg)    Physical Exam Constitutional:      General: He is not in acute distress.    Appearance: He is not diaphoretic.  HENT:     Head: Normocephalic and atraumatic.  Eyes:     General: No scleral icterus. Cardiovascular:     Rate and Rhythm: Normal rate and regular rhythm.     Heart sounds: No murmur heard. Pulmonary:     Effort: Pulmonary effort is normal. No respiratory distress.  Abdominal:     General: Bowel sounds are normal. There is no distension.     Palpations: Abdomen is  soft.  Musculoskeletal:        General: Normal range of motion.     Cervical back: Normal range of motion and neck supple.  Skin:    General: Skin is warm and dry.     Findings: No erythema.  Neurological:      Mental Status: He is alert and oriented to person, place, and time. Mental status is at baseline.     Cranial Nerves: No cranial nerve deficit.     Motor: No abnormal muscle tone.  Psychiatric:        Mood and Affect: Mood and affect normal.      LABORATORY DATA:  I have reviewed the data as listed    Latest Ref Rng & Units 07/07/2024    8:45 AM 06/16/2024    8:06 AM 05/26/2024    8:41 AM  CBC  WBC 4.0 - 10.5 K/uL 6.2  6.3  4.6   Hemoglobin 13.0 - 17.0 g/dL 85.3  85.5  85.3   Hematocrit 39.0 - 52.0 % 44.2  42.3  43.8   Platelets 150 - 400 K/uL 223  227  202       Latest Ref Rng & Units 07/07/2024    8:45 AM 06/16/2024    8:06 AM 05/26/2024    8:41 AM  CMP  Glucose 70 - 99 mg/dL 93  84  884   BUN 8 - 23 mg/dL 18  20  16    Creatinine 0.61 - 1.24 mg/dL 9.03  8.95  9.10   Sodium 135 - 145 mmol/L 139  140  139   Potassium 3.5 - 5.1 mmol/L 4.3  4.4  4.0   Chloride 98 - 111 mmol/L 106  105  101   CO2 22 - 32 mmol/L 22  26  27    Calcium 8.9 - 10.3 mg/dL 9.3  9.1  9.3   Total Protein 6.5 - 8.1 g/dL 6.7  6.5  6.7   Total Bilirubin 0.0 - 1.2 mg/dL 0.7  0.7  0.8   Alkaline Phos 38 - 126 U/L 91  91  69   AST 15 - 41 U/L 25  22  26    ALT 0 - 44 U/L 17  17  18       RADIOGRAPHIC STUDIES: I have personally reviewed the radiological images as listed and agreed with the findings in the report. No results found.

## 2024-07-07 NOTE — Assessment & Plan Note (Signed)
 S/p Brain Radiation. Repeat brain MRI showed treatment response . He was seen by neurology Dr. Buckley.  - plan to repeat MRI brain in Jan 2026

## 2024-07-07 NOTE — Assessment & Plan Note (Addendum)
 Imaging results and pathology results were reviewed and discussed with patient. 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 NGS showed TMB 53.2 high, PD-L1 <1%, SMARCB1- high TMB, Keytruda  was added on cycle 2 Previously on carboplatin , Taxol  Keytruda . Currently on Keytruda  maintenance September 2025 CT scan showed continue partial response.   Labs are reviewed and discussed with patient.   continue Keytruda  for maintenance.   Next CT - plan in Jan 2026

## 2024-07-08 ENCOUNTER — Ambulatory Visit
Admission: RE | Admit: 2024-07-08 | Discharge: 2024-07-08 | Disposition: A | Discharge status: Home / Self Care | Source: Ambulatory Visit | Attending: Radiation Oncology | Admitting: Radiation Oncology

## 2024-07-08 ENCOUNTER — Other Ambulatory Visit: Payer: Self-pay

## 2024-07-08 DIAGNOSIS — Z510 Encounter for antineoplastic radiation therapy: Secondary | ICD-10-CM | POA: Diagnosis not present

## 2024-07-08 LAB — RAD ONC ARIA SESSION SUMMARY
Course Elapsed Days: 15
Plan Fractions Treated to Date: 6
Plan Prescribed Dose Per Fraction: 0.5 Gy
Plan Total Fractions Prescribed: 6
Plan Total Prescribed Dose: 3 Gy
Reference Point Dosage Given to Date: 3 Gy
Reference Point Session Dosage Given: 0.5 Gy
Session Number: 6

## 2024-07-10 NOTE — Radiation Completion Notes (Signed)
 Patient Name: Tony Mitchell, Tony Mitchell MRN: [id number] Date of Birth: 11-29-1940 Referring Physician: TAMRA LEVENTHAL, M.D. Date of Service: 2024-07-10 Radiation Oncologist: Marcey Penton, M.D. [facility] [place]                             RADIATION ONCOLOGY END OF TREATMENT NOTE     Diagnosis: M17.11 Unilateral primary osteoarthritis, right knee Staging on 2023-12-17: Squamous cell carcinoma lung, left (HCC) T=cT2a, N=cN2, M=pM1c Intent: Curative     HPI: Patient is an 83 year old male well-known to our department previously retreated SRS for solitary brain metastasis in a patient with known stage IV squamous cell carcinoma of the lung.  Currently being treated with Taxol  and Keytruda  and is stable.  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  Pain waxes and wanes between a 7-8 on severity level although at times is down to 1 to depending on use and standing position.  He has self-referred for consideration of low-dose radiation for osteoarthritis      ==========DELIVERED PLANS==========  First Treatment Date: 2024-06-23 Last Treatment Date: 2024-07-08   Plan Name: Ext_R Site: Knee, Right Technique: Isodose Plan Mode: Photon Dose Per Fraction: 0.5 Gy Prescribed Dose (Delivered / Prescribed): 3 Gy / 3 Gy Prescribed Fxs (Delivered / Prescribed): 6 / 6     ==========ON TREATMENT VISIT DATES========== 2024-06-23, 2024-06-29     ==========UPCOMING VISITS========== 09/29/2024 [facility] MR BRAIN W WO CONTRAST [facility] 1  09/22/2024 [facility] OFFICE VISIT Francisca Redell BROCKS, MD  09/08/2024 [facility] TELEPHONE OFFICE VISIT [employer], Fonda SAUNDERS, NP  08/25/2024 [facility] INFUSION [facility] 1  08/25/2024 [facility] EST PT Babara Call, MD  08/25/2024 [facility] LAB [facility]  08/24/2024 [facility]  ONCOLOGY FOLLOW UP 30 Penton Marcey, MD  07/28/2024 [facility] INFUSION [facility] 12  07/28/2024 [facility] EST PT Babara Call, MD  07/28/2024 [facility]        ==========APPENDIX - ON TREATMENT VISIT NOTES==========   See weekly On Treatment Notes in Epic for details in the Media tab (listed as Progress notes on the On Treatment Visit Dates listed above).

## 2024-07-13 ENCOUNTER — Telehealth: Payer: Self-pay | Admitting: Internal Medicine

## 2024-07-13 NOTE — Telephone Encounter (Signed)
 Left VM that appointment needed to be rescheduled due to provider being out of office. Will send Mychart communication also.

## 2024-07-28 ENCOUNTER — Ambulatory Visit

## 2024-07-28 ENCOUNTER — Encounter: Payer: Self-pay | Admitting: Oncology

## 2024-07-28 ENCOUNTER — Ambulatory Visit: Admitting: Oncology

## 2024-07-28 ENCOUNTER — Inpatient Hospital Stay: Attending: Oncology

## 2024-07-28 VITALS — BP 174/74 | HR 71 | Temp 96.1°F | Resp 18 | Wt 157.7 lb

## 2024-07-28 VITALS — BP 133/64 | HR 67

## 2024-07-28 DIAGNOSIS — Z5112 Encounter for antineoplastic immunotherapy: Secondary | ICD-10-CM

## 2024-07-28 DIAGNOSIS — E785 Hyperlipidemia, unspecified: Secondary | ICD-10-CM | POA: Insufficient documentation

## 2024-07-28 DIAGNOSIS — Z79899 Other long term (current) drug therapy: Secondary | ICD-10-CM | POA: Diagnosis not present

## 2024-07-28 DIAGNOSIS — E1122 Type 2 diabetes mellitus with diabetic chronic kidney disease: Secondary | ICD-10-CM | POA: Diagnosis not present

## 2024-07-28 DIAGNOSIS — N1832 Chronic kidney disease, stage 3b: Secondary | ICD-10-CM | POA: Insufficient documentation

## 2024-07-28 DIAGNOSIS — C7951 Secondary malignant neoplasm of bone: Secondary | ICD-10-CM | POA: Diagnosis not present

## 2024-07-28 DIAGNOSIS — G4730 Sleep apnea, unspecified: Secondary | ICD-10-CM | POA: Diagnosis not present

## 2024-07-28 DIAGNOSIS — C801 Malignant (primary) neoplasm, unspecified: Secondary | ICD-10-CM | POA: Diagnosis not present

## 2024-07-28 DIAGNOSIS — Z8042 Family history of malignant neoplasm of prostate: Secondary | ICD-10-CM | POA: Diagnosis not present

## 2024-07-28 DIAGNOSIS — I129 Hypertensive chronic kidney disease with stage 1 through stage 4 chronic kidney disease, or unspecified chronic kidney disease: Secondary | ICD-10-CM | POA: Insufficient documentation

## 2024-07-28 DIAGNOSIS — C3492 Malignant neoplasm of unspecified part of left bronchus or lung: Secondary | ICD-10-CM

## 2024-07-28 DIAGNOSIS — C7931 Secondary malignant neoplasm of brain: Secondary | ICD-10-CM | POA: Diagnosis not present

## 2024-07-28 DIAGNOSIS — M25511 Pain in right shoulder: Secondary | ICD-10-CM | POA: Diagnosis not present

## 2024-07-28 DIAGNOSIS — C787 Secondary malignant neoplasm of liver and intrahepatic bile duct: Secondary | ICD-10-CM | POA: Insufficient documentation

## 2024-07-28 DIAGNOSIS — J449 Chronic obstructive pulmonary disease, unspecified: Secondary | ICD-10-CM | POA: Diagnosis not present

## 2024-07-28 DIAGNOSIS — I2510 Atherosclerotic heart disease of native coronary artery without angina pectoris: Secondary | ICD-10-CM | POA: Insufficient documentation

## 2024-07-28 DIAGNOSIS — Z8679 Personal history of other diseases of the circulatory system: Secondary | ICD-10-CM | POA: Insufficient documentation

## 2024-07-28 DIAGNOSIS — G8929 Other chronic pain: Secondary | ICD-10-CM

## 2024-07-28 DIAGNOSIS — Z87891 Personal history of nicotine dependence: Secondary | ICD-10-CM | POA: Diagnosis not present

## 2024-07-28 DIAGNOSIS — F4321 Adjustment disorder with depressed mood: Secondary | ICD-10-CM | POA: Insufficient documentation

## 2024-07-28 LAB — CMP (CANCER CENTER ONLY)
ALT: 19 U/L (ref 0–44)
AST: 19 U/L (ref 15–41)
Albumin: 4.6 g/dL (ref 3.5–5.0)
Alkaline Phosphatase: 92 U/L (ref 38–126)
Anion gap: 13 (ref 5–15)
BUN: 17 mg/dL (ref 8–23)
CO2: 28 mmol/L (ref 22–32)
Calcium: 9.7 mg/dL (ref 8.9–10.3)
Chloride: 100 mmol/L (ref 98–111)
Creatinine: 0.96 mg/dL (ref 0.61–1.24)
GFR, Estimated: >60 mL/min (ref >60)
Glucose, Bld: 102 mg/dL — ABNORMAL HIGH (ref 70–99)
Potassium: 4.3 mmol/L (ref 3.5–5.1)
Sodium: 140 mmol/L (ref 135–145)
Total Bilirubin: 0.6 mg/dL (ref 0.0–1.2)
Total Protein: 6.8 g/dL (ref 6.5–8.1)

## 2024-07-28 LAB — CBC WITH DIFFERENTIAL (CANCER CENTER ONLY)
Abs Immature Granulocytes: 0.03 K/uL (ref 0.00–0.07)
Basophils Absolute: 0 K/uL (ref 0.0–0.1)
Basophils Relative: 0 %
Eosinophils Absolute: 0.1 K/uL (ref 0.0–0.5)
Eosinophils Relative: 1 %
HCT: 47 % (ref 39.0–52.0)
Hemoglobin: 15.7 g/dL (ref 13.0–17.0)
Immature Granulocytes: 0 %
Lymphocytes Relative: 24 %
Lymphs Abs: 1.8 K/uL (ref 0.7–4.0)
MCH: 29.8 pg (ref 26.0–34.0)
MCHC: 33.4 g/dL (ref 30.0–36.0)
MCV: 89.4 fL (ref 80.0–100.0)
Monocytes Absolute: 0.7 K/uL (ref 0.1–1.0)
Monocytes Relative: 9 %
Neutro Abs: 4.8 K/uL (ref 1.7–7.7)
Neutrophils Relative %: 66 %
Platelet Count: 183 K/uL (ref 150–400)
RBC: 5.26 MIL/uL (ref 4.22–5.81)
RDW: 14.2 % (ref 11.5–15.5)
WBC Count: 7.4 K/uL (ref 4.0–10.5)
nRBC: 0 % (ref 0.0–0.2)

## 2024-07-28 LAB — TSH: TSH: 1.25 u[IU]/mL (ref 0.350–4.500)

## 2024-07-28 MED ORDER — SODIUM CHLORIDE 0.9 % IV SOLN
200.0000 mg | Freq: Once | INTRAVENOUS | Status: AC
  Administered 2024-07-28: 200 mg via INTRAVENOUS
  Filled 2024-07-28: qty 8

## 2024-07-28 MED ORDER — SODIUM CHLORIDE 0.9 % IV SOLN
INTRAVENOUS | Status: DC
  Filled 2024-07-28: qty 250

## 2024-07-28 NOTE — Progress Notes (Signed)
 Hematology/Oncology Progress note [phone] [phone]     CHIEF COMPLAINTS/PURPOSE OF CONSULTATION:  Metastatic squamous cell carcinoma  ASSESSMENT & PLAN:   Cancer Staging  Squamous cell carcinoma lung, left (HCC) Staging form: Lung, AJCC V9 - Clinical stage from 12/17/2023: [name], pM1c - Signed by Babara Call, MD on 12/17/2023   Squamous cell carcinoma lung, left [facility]) Imaging results and pathology results were reviewed and discussed with patient. 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 NGS showed TMB 53.2 high, PD-L1 <1%, SMARCB1- high TMB, Keytruda  was added on cycle 2 Previously on carboplatin , Taxol  Keytruda . Currently on Keytruda  maintenance September 2025 CT scan showed continue partial response.   Labs are reviewed and discussed with patient.   continue Keytruda  for maintenance.   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 Repeat brain MRI showed treatment response . He was seen by neurology Dr. Buckley.  - plan to repeat MRI brain in Jan 2026  Shoulder pain Shoulder pain and stiffness. Possible arthritis exacerbated by immunotherapy.  He received steroid injections to both shoulders. Symptoms are better    Orders Placed This Encounter  Procedures   CBC with Differential (Cancer Center Only)    Standing Status:   Future    Expected Date:   09/15/2024    Expiration Date:   09/15/2025   CMP (Cancer Center only)    Standing Status:   Future    Expected Date:   09/15/2024    Expiration Date:   09/15/2025   Follow up  3 weeks  All questions were answered. The patient knows to call the clinic with any problems, questions or concerns.  Call Babara, MD, PhD [facility] Hematology Oncology 07/28/2024    HISTORY OF PRESENTING ILLNESS:  Tony Mitchell 83 y.o. male presents for follow up of stage IV lung SCC  Oncology History  Squamous cell carcinoma lung, left (HCC)  11/22/2023 Imaging   CT abdomen pelvis w contrast  1. Multiple hepatic metastatic lesions. Correlation with history of known malignancy recommended. 2. Serosal implant along the distal stomach versus gastrohepatic adenopathy. 3. Thickened appearance of the gastric antrum and pylorus likely related to underdistention. An infiltrative mass is less likely but not excluded. Endoscopy may provide better evaluation. 4. Herniation of a segment of the sigmoid colon into the left inguinal canal. No bowel obstruction.     12/04/2023 Imaging   PET scan showed  Extensive hypermetabolic neoplastic disease including left lower lobe infiltrative mass extending from the pleura to the hilum with numerous abnormal left-sided hilar and mediastinal nodes.   Additional upper retroperitoneal nodes as well as extensive liver metastases.   Multifocal osseous metastatic disease including the thoracic and lumbar spine, pelvis, right humeral head and left scapula.   Based on the overall appearance and with the patient's laboratory a abnormality this very well could be a primary left lower lobe lung lesion such as small cell versus a neuroendocrine tumor with diffuse metastatic disease.   12/10/2023 Imaging   MRI brain w wo contrast  1. 10 x 9 x 10 mm rounded lesion in the posterior left frontal lobe with surrounding vasogenic edema is consistent with metastatic disease. 2. No other pathologic enhancement is present. 3. Periventricular and scattered subcortical T2 hyperintensities bilaterally are mildly advanced for age. The finding is  nonspecific but can be seen in the setting of chronic microvascular ischemia, a demyelinating process such as multiple sclerosis, vasculitis, complicated migraine headaches, or as the sequelae of a prior infectious or inflammatory process.    12/17/2023 Initial Diagnosis   Squamous cell carcinoma lung, left (HCC)  He began experiencing abdominal pain for a week.  Initially presenting as increased gas and stomach growling despite having eaten. The pain was localized between the rib cage and the abdomen and described as intermittent.  Later the pain had resolved, but he continues to feel bloated. He has CT abdomen pelvis done which showed liver masses.   Liver biopsy showed  1. Liver, biopsy, right hepatic lesion :      - METASTATIC POORLY DIFFERENTIATED SQUAMOUS CELL CARCINOMA WITH NECROSIS.  Diagnosis Note : Per [facility] a recent PET scan revealed a hypermetabolic left lower lobe lung mass with extensive metastatic lesions. The carcinoma is positive for cytokeratin 7, p40 (greater than 50%), and CD56 (subset). Cytokeratin 20, TTF-1,  chromogranin, synaptophysin, and CDX-2 are negative. In the absence of  chromogranin / synaptophysin staining the presence of patchy CD56 staining is interpreted as non-specific. The pattern of immunoreactivity is consistent with metastatic poorly differentiated squamous cell carcinoma.     12/17/2023 Cancer Staging   Staging form: Lung, AJCC V9 - Clinical stage from 12/17/2023: [name], pM1c - Signed by Babara Call, MD on 12/17/2023 Histopathologic type: Squamous cell carcinoma, NOS Stage prefix: Initial diagnosis Laterality: Left Sites of metastasis: Bone, Liver, Brain   12/23/2023 - 12/25/2023 Chemotherapy   Patient is on Treatment Plan : LUNG Carboplatin  + Paclitaxel  q21d     01/14/2024 -  Chemotherapy   Carboplatin  + Paclitaxel  + Pembrolizumab  (200) D1 q21d x 4 cycles / Pembrolizumab  (200) Maintenance D1 q21d       Imaging   CT chest abdomen pelvis w contrast  1. Nearly complete resolution of previously seen dependent left lower lobe masses and nodularity. Additional small nonspecific pulmonary nodules unchanged. 2. No persistently enlarged mediastinal or left hilar lymph nodes. 3. Significantly  diminished size of numerous hypodense liver lesions. 4. Constellation of findings is consistent with treatment response of lung malignancy and associated metastatic disease. 5. Very little CT correlate of previously seen FDG avid osseous metastatic disease; subtle lucency of the T5 vertebral body at the site of a previously FDG avid metastasis. Other lesions not clearly appreciated by CT. Nuclear scintigraphic bone scan or repeat PET-CT may be helpful to assess for residual metabolically active osseous metastatic disease. 6. Emphysema and diffuse bilateral bronchial wall thickening. 7. Coronary artery disease. 8. Large left inguinal hernia containing nonobstructed sigmoid colon.   Aortic Atherosclerosis (ICD10-I70.0) and Emphysema (ICD10-J43.9).   02/18/2024 Imaging   CT chest abdomen pelvis w contrast showed 1. Nearly complete resolution of previously seen dependent left lower lobe masses and nodularity. Additional small nonspecific pulmonary nodules unchanged. 2. No persistently enlarged mediastinal or left hilar lymph nodes. 3. Significantly diminished size of numerous hypodense liver lesions. 4. Constellation of findings is consistent with treatment response of lung malignancy and associated metastatic disease. 5. Very little CT correlate of previously seen FDG avid osseous metastatic disease; subtle lucency of the T5 vertebral body at the site of a previously FDG avid metastasis. Other lesions not clearly appreciated by CT. Nuclear scintigraphic bone scan or repeat PET-CT may be helpful to assess for residual metabolically active osseous metastatic disease. 6. Emphysema and diffuse bilateral bronchial wall thickening. 7. Coronary artery disease. 8. Large left inguinal  hernia containing nonobstructed sigmoid colon.   Aortic Atherosclerosis (ICD10-I70.0) and Emphysema (ICD10-J43.9).     Patient denies changes in bowel habits.  Denies unintentional weight loss night sweats or  fever. No history of blood transfusions or hepatitis. He is not on any blood thinners but takes aspirin  for preventive purposes.  He has a history of stage 3 renal disease diagnosed three years ago after a blood test revealed elevated potassium levels. Since then, he has made significant lifestyle changes, including stopping alcohol consumption and losing weight. He now maintains a weight between 145 and 151 pounds and walks approximately 5.6 miles daily.  He has a history of skin cancer, treated with resection, and is scheduled for further treatment. His father had prostate cancer.  Patient had a normal PSA in January 2025.   Patient lives at home with wife who has multiple medical problems.  He is the caregiver for her. His daughter passed away last year.  He has granddaughter who lives in [place] .  S/p brain radiation he finished tapering course of steroid.   + Peripheral neuropathy he takes gabapentin  100 mg 2-3 times a day. Follows up with neurology   INTERVAL HISTORY Tony Mitchell is a 83 y.o. male who has above history reviewed by me today presents for follow up visit for treatment of metastatic squamous cell lung cancer.   Discussed the use of AI scribe software for clinical note transcription with the patient, who gave verbal consent to proceed.   He reports acute onset of right upper quadrant and back pain that began yesterday after turning and sitting in his car. The pain is persistent, localized, and described as annoying, with reproducible tenderness on self palpation. It is present when upright and resolves when lying down. He experienced a transient episode of lightheadedness at the onset, which resolved spontaneously. He denies other new or worsening symptoms related to his malignancy.  He notes a recent increase in blood pressure, which he attributes to emotional stress following the death of his sister from recurrent  cancer. He has resumed amlodipine  and hydralazine  per his  prescribed regimen after previously discontinuing them due to hypotension and dizziness. He is monitoring his blood pressure at home and is aware of the need to avoid hypotension.   He is experiencing significant emotional distress and grief due to his sister's recent passing, describing himself as mentally affected but physically fine. '  He reports improvement in chronic bilateral shoulder pain following corticosteroid injections, with no new musculoskeletal complaints at this time.   MEDICAL HISTORY:  Past Medical History:  Diagnosis Date   Acute appendicitis 03/03/2018   Arthritis    Cancer (HCC)    skin cancer   Chronic kidney disease, stage 3b (HCC)    Collapsed lung 2019   x2   COPD (chronic obstructive pulmonary disease) (HCC)    Coronary artery disease    Diabetes mellitus without complication (HCC)    diet controlled-lost 60 lbs and is not on any current meds   GERD (gastroesophageal reflux disease)    Gout    Hyperlipidemia    Hypertension    Sleep apnea    uses cpap   Thoracic aortic aneurysm    Vitamin B 12 deficiency     SURGICAL HISTORY: Past Surgical History:  Procedure Laterality Date   CARDIAC CATHETERIZATION     CATARACT EXTRACTION  2012   CHEST TUBE INSERTION     x2   COLONOSCOPY WITH PROPOFOL  N/A 04/14/2021   Procedure:  COLONOSCOPY WITH PROPOFOL ;  Surgeon: Maryruth Ole DASEN, MD;  Location: [facility] ENDOSCOPY;  Service: Endoscopy;  Laterality: N/A;   ESOPHAGOGASTRODUODENOSCOPY (EGD) WITH PROPOFOL  N/A 04/14/2021   Procedure: ESOPHAGOGASTRODUODENOSCOPY (EGD) WITH PROPOFOL ;  Surgeon: Maryruth Ole DASEN, MD;  Location: [facility] ENDOSCOPY;  Service: Endoscopy;  Laterality: N/A;   INSERTION OF MESH  09/22/2021   Procedure: INSERTION OF MESH;  Surgeon: Tye Millet, DO;  Location: [facility] ORS;  Service: General;;   JOINT REPLACEMENT     KNEE SURGERY Left 1965   LAPAROSCOPIC APPENDECTOMY N/A 03/03/2018   Procedure: APPENDECTOMY LAPAROSCOPIC;  Surgeon: Nicholaus Selinda Birmingham, MD;  Location: [facility] ORS;  Service: General;  Laterality: N/A;   TOTAL KNEE ARTHROPLASTY Left 09/16/2018   Procedure: TOTAL KNEE ARTHROPLASTY;  Surgeon: Edie Norleen PARAS, MD;  Location: [facility] ORS;  Service: Orthopedics;  Laterality: Left;    SOCIAL HISTORY: Social History   Socioeconomic History   Marital status: Married    Spouse name: Not on file   Number of children: Not on file   Years of education: Not on file   Highest education level: Not on file  Occupational History   Not on file  Tobacco Use   Smoking status: Former    Current packs/day: 0.00    Average packs/day: 1 pack/day for 40.0 years (40.0 ttl pk-yrs)    Types: Cigarettes    Start date: 06/15/1956    Quit date: 06/15/1996    Years since quitting: 28.1    Passive exposure: Past   Smokeless tobacco: Never  Vaping Use   Vaping status: Never Used  Substance and Sexual Activity   Alcohol use: Not Currently    Alcohol/week: 21.0 standard drinks of alcohol    Types: 21 Shots of liquor per week    Comment: reports last drink 11/03/20   Drug use: No   Sexual activity: Yes  Other Topics Concern   Not on file  Social History Narrative   Not on file   Social Drivers of Health   Financial Resource Strain: Low Risk  (10/30/2023)   Received [facility]   Overall Financial Resource Strain (CARDIA)    Difficulty of Paying Living Expenses: Not hard at all  Food Insecurity: No Food Insecurity (10/30/2023)   Received from [facility]   Hunger Vital Sign    Within the past 12 months, you worried that your food would run out before you got the money to buy more.: Never true    Within the past 12 months, the food you bought just didn't last and you didn't have money to get more.: Never true  Transportation Needs: No Transportation Needs (10/30/2023)   Received from [facility] - Transportation    In the past 12 months, has lack of transportation kept you  from medical appointments or from getting medications?: No    Lack of Transportation (Non-Medical): No  Physical Activity: Sufficiently Active (07/10/2017)   Received from [facility]   Exercise Vital Sign    Days of Exercise per Week: 5 days    Minutes of Exercise per Session: 60 min  Stress: Stress Concern Present (07/10/2017)   Received from [facility] - Occupational Stress Questionnaire    Feeling of Stress : Rather much  Social Connections: Unknown (07/10/2017)   Received from [facility]   Social Connection and Isolation Panel    Frequency of Communication  with Friends and Family: Patient declined    Frequency of Social Gatherings with Friends and Family: Patient declined    Attends Religious Services: Patient declined    Database Administrator or Organizations: Patient declined    Attends Engineer, Structural: Patient declined    Marital Status: Patient declined  Catering Manager Violence: Not on file    FAMILY HISTORY: Family History  Problem Relation Age of Onset   Cancer Father        Unknown   Prostate cancer Father    Lung cancer Neg Hx     ALLERGIES:  has no known allergies.  MEDICATIONS:  Current Outpatient Medications  Medication Sig Dispense Refill   allopurinol  (ZYLOPRIM ) 100 MG tablet Take 100 mg by mouth in the morning and at bedtime.     ALPRAZolam  (XANAX ) 0.5 MG tablet Take 0.5 mg by mouth at bedtime.     amLODipine  (NORVASC ) 2.5 MG tablet Take 2.5 mg by mouth in the morning.     colchicine 0.6 MG tablet Take 2 tablets (1.2mg ) by mouth at first sign of gout flare followed by 1 tablet (0.6mg ) after 1 hour. (Max 1.8mg  within 1 hour)     fenofibrate  (TRICOR ) 145 MG tablet Take 145 mg by mouth 2 (two) times a week. On Wednesday and Sunday     finasteride  (PROSCAR ) 5 MG tablet Take 1 tablet (5 mg total) by mouth daily. 90 tablet 3   furosemide  (LASIX )  20 MG tablet Take 1 tablet (20 mg total) by mouth daily as needed. 30 tablet 0   gabapentin  (NEURONTIN ) 300 MG capsule Take 1 capsule (300 mg total) by mouth 3 (three) times daily. 90 capsule 2   hydrALAZINE  (APRESOLINE ) 25 MG tablet Take 25 mg by mouth 2 (two) times daily.     lansoprazole (PREVACID) 15 MG capsule Take 15 mg by mouth every evening.     simvastatin  (ZOCOR ) 20 MG tablet Take 1 tablet by mouth 2 (two) times a week. On  Wednesday and Sunday     tamsulosin  (FLOMAX ) 0.4 MG CAPS capsule Take 1 capsule (0.4 mg total) by mouth daily. 30 capsule 11   traMADol  (ULTRAM ) 50 MG tablet Take 1 tablet (50 mg total) by mouth every 12 (twelve) hours as needed. 30 tablet 0   triamcinolone  ointment (KENALOG ) 0.5 % Apply 1 Application topically 2 (two) times daily. 30 g 0   vitamin B-12 (CYANOCOBALAMIN ) 1000 MCG tablet Take 1,000 mcg by mouth every morning.     Vitamin D, Ergocalciferol, (DRISDOL) 1.25 MG (50000 UNIT) CAPS capsule Take 50,000 Units by mouth.     No current facility-administered medications for this visit.   Facility-Administered Medications Ordered in Other Visits  Medication Dose Route Frequency Provider Last Rate Last Admin   0.9 %  sodium chloride  infusion   Intravenous Continuous Babara Call, MD 10 mL/hr at 02/04/24 0921 New Bag at 02/04/24 0921   heparin  lock flush 100 unit/mL  500 Units Intracatheter Once PRN Babara Call, MD        Review of Systems  Constitutional:  Negative for appetite change, chills, fatigue, fever and unexpected weight change.  HENT:   Negative for hearing loss and voice change.   Eyes:  Negative for eye problems and icterus.  Respiratory:  Negative for chest tightness, cough and shortness of breath.   Cardiovascular:  Negative for chest pain and leg swelling.  Gastrointestinal:  Negative for abdominal distention and abdominal pain.  Endocrine: Negative for hot flashes.  Genitourinary:  Negative for difficulty urinating, dysuria and frequency.    Musculoskeletal:  Positive for arthralgias.  Skin:  Negative for itching and rash.  Neurological:  Positive for numbness. Negative for light-headedness.  Hematological:  Negative for adenopathy. Does not bruise/bleed easily.  Psychiatric/Behavioral:  Negative for confusion.      PHYSICAL EXAMINATION: ECOG PERFORMANCE STATUS: 1 - Symptomatic but completely ambulatory  Vitals:   07/28/24 0913 07/28/24 0922  BP: (!) 173/77 (!) 174/74  Pulse: 71   Resp: 18   Temp: (!) 96.1 F (35.6 C)   SpO2: 99%    Filed Weights   07/28/24 0913  Weight: 157 lb 11.2 oz (71.5 kg)    Physical Exam Constitutional:      General: He is not in acute distress.    Appearance: He is not diaphoretic.  HENT:     Head: Normocephalic and atraumatic.  Eyes:     General: No scleral icterus. Cardiovascular:     Rate and Rhythm: Normal rate and regular rhythm.     Heart sounds: No murmur heard. Pulmonary:     Effort: Pulmonary effort is normal. No respiratory distress.  Abdominal:     General: Bowel sounds are normal. There is no distension.     Palpations: Abdomen is soft. There is no mass.     Tenderness: There is no abdominal tenderness.  Musculoskeletal:        General: Normal range of motion.     Cervical back: Normal range of motion and neck supple.  Skin:    General: Skin is warm and dry.     Findings: No erythema.  Neurological:     Mental Status: He is alert and oriented to person, place, and time. Mental status is at baseline.     Cranial Nerves: No cranial nerve deficit.     Motor: No abnormal muscle tone.  Psychiatric:        Mood and Affect: Mood and affect normal.      LABORATORY DATA:  I have reviewed the data as listed    Latest Ref Rng & Units 07/28/2024    8:51 AM 07/07/2024    8:45 AM 06/16/2024    8:06 AM  CBC  WBC 4.0 - 10.5 K/uL 7.4  6.2  6.3   Hemoglobin 13.0 - 17.0 g/dL 84.2  85.3  85.5   Hematocrit 39.0 - 52.0 % 47.0  44.2  42.3   Platelets 150 - 400 K/uL 183   223  227       Latest Ref Rng & Units 07/07/2024    8:45 AM 06/16/2024    8:06 AM 05/26/2024    8:41 AM  CMP  Glucose 70 - 99 mg/dL 93  84  884   BUN 8 - 23 mg/dL 18  20  16    Creatinine 0.61 - 1.24 mg/dL 9.03  8.95  9.10   Sodium 135 - 145 mmol/L 139  140  139   Potassium 3.5 - 5.1 mmol/L 4.3  4.4  4.0   Chloride 98 - 111 mmol/L 106  105  101   CO2 22 - 32 mmol/L 22  26  27    Calcium 8.9 - 10.3 mg/dL 9.3  9.1  9.3   Total Protein 6.5 - 8.1 g/dL 6.7  6.5  6.7   Total Bilirubin 0.0 - 1.2 mg/dL 0.7  0.7  0.8   Alkaline Phos 38 - 126 U/L 91  91  69   AST 15 - 41 U/L  25  22  26    ALT 0 - 44 U/L 17  17  18       RADIOGRAPHIC STUDIES: I have personally reviewed the radiological images as listed and agreed with the findings in the report. No results found.

## 2024-07-28 NOTE — Assessment & Plan Note (Signed)
 Shoulder pain and stiffness. Possible arthritis exacerbated by immunotherapy.  He received steroid injections to both shoulders. Symptoms are better

## 2024-07-28 NOTE — Assessment & Plan Note (Signed)
 Keytruda  treatments as planned

## 2024-07-28 NOTE — Assessment & Plan Note (Signed)
 S/p Brain Radiation. Repeat brain MRI showed treatment response . He was seen by neurology Dr. Buckley.  - plan to repeat MRI brain in Jan 2026

## 2024-07-28 NOTE — Assessment & Plan Note (Signed)
 Imaging results and pathology results were reviewed and discussed with patient. 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 NGS showed TMB 53.2 high, PD-L1 <1%, SMARCB1- high TMB, Keytruda  was added on cycle 2 Previously on carboplatin , Taxol  Keytruda . Currently on Keytruda  maintenance September 2025 CT scan showed continue partial response.   Labs are reviewed and discussed with patient.   continue Keytruda  for maintenance.   Next CT - plan in end of December 2025

## 2024-07-29 LAB — T4: T4, Total: 8.3 ug/dL (ref 4.5–12.0)

## 2024-08-12 ENCOUNTER — Encounter: Payer: Self-pay | Admitting: Radiation Oncology

## 2024-08-17 ENCOUNTER — Ambulatory Visit
Admission: RE | Admit: 2024-08-17 | Discharge: 2024-08-17 | Disposition: A | Discharge status: Home / Self Care | Source: Ambulatory Visit | Attending: Oncology | Admitting: Oncology

## 2024-08-17 DIAGNOSIS — C3492 Malignant neoplasm of unspecified part of left bronchus or lung: Secondary | ICD-10-CM | POA: Diagnosis present

## 2024-08-17 MED ORDER — IOHEXOL 300 MG/ML  SOLN
100.0000 mL | Freq: Once | INTRAMUSCULAR | Status: AC | PRN
  Administered 2024-08-17: 100 mL via INTRAVENOUS

## 2024-08-24 ENCOUNTER — Ambulatory Visit
Admission: RE | Admit: 2024-08-24 | Discharge: 2024-08-24 | Disposition: A | Discharge status: Home / Self Care | Source: Ambulatory Visit | Attending: Radiation Oncology | Admitting: Radiation Oncology

## 2024-08-24 ENCOUNTER — Encounter: Payer: Self-pay | Admitting: Radiation Oncology

## 2024-08-24 VITALS — BP 149/65 | HR 77 | Temp 97.6°F | Resp 17 | Wt 160.2 lb

## 2024-08-24 DIAGNOSIS — M1711 Unilateral primary osteoarthritis, right knee: Secondary | ICD-10-CM | POA: Insufficient documentation

## 2024-08-24 DIAGNOSIS — Z923 Personal history of irradiation: Secondary | ICD-10-CM | POA: Insufficient documentation

## 2024-08-24 NOTE — Progress Notes (Signed)
 Radiation Oncology Follow up Note  Name: Tony Mitchell   Date:   08/24/2024 MRN:  [id number] DOB: 1941/06/21    This 84 y.o. 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  He states he is had about a 70% pain response.  He is ambulating well also states he has greater flexion of his knee.  He is able to stand on his right knee without significant pain is been walking about 5 miles at a time..  COMPLICATIONS OF TREATMENT: none  FOLLOW UP COMPLIANCE: keeps appointments   PHYSICAL EXAM:  BP (!) 149/65   Pulse 77   Temp 97.6 F (36.4 C)   Resp 17   Wt 160 lb 3.2 oz (72.7 kg)   SpO2 99%   BMI 24.56 kg/m  Range of motion lower knee does not elicit pain.  Flexion appears improved.  No evidence of any skin reaction noted.  RADIOLOGY RESULTS: No current films for review  PLAN: Present time patient is received in excellent palliative response for his OA from LDR.  And pleased with overall progress.  Of asked to see him back in 6 months for follow-up.  Patient knows to call sooner with any concerns.  I would like to take this opportunity to thank you for allowing me to participate in the care of your patient.SABRA Marcey Penton, MD

## 2024-08-25 ENCOUNTER — Encounter: Payer: Self-pay | Admitting: Oncology

## 2024-08-25 ENCOUNTER — Inpatient Hospital Stay

## 2024-08-25 ENCOUNTER — Inpatient Hospital Stay (HOSPITAL_BASED_OUTPATIENT_CLINIC_OR_DEPARTMENT_OTHER): Admitting: Oncology

## 2024-08-25 ENCOUNTER — Inpatient Hospital Stay: Attending: Oncology

## 2024-08-25 VITALS — BP 140/65 | HR 87 | Resp 16

## 2024-08-25 VITALS — BP 157/73 | HR 69 | Temp 97.0°F | Resp 18 | Wt 160.7 lb

## 2024-08-25 DIAGNOSIS — N1832 Chronic kidney disease, stage 3b: Secondary | ICD-10-CM | POA: Diagnosis not present

## 2024-08-25 DIAGNOSIS — E1122 Type 2 diabetes mellitus with diabetic chronic kidney disease: Secondary | ICD-10-CM | POA: Diagnosis not present

## 2024-08-25 DIAGNOSIS — M109 Gout, unspecified: Secondary | ICD-10-CM | POA: Diagnosis not present

## 2024-08-25 DIAGNOSIS — Z79899 Other long term (current) drug therapy: Secondary | ICD-10-CM | POA: Diagnosis not present

## 2024-08-25 DIAGNOSIS — C787 Secondary malignant neoplasm of liver and intrahepatic bile duct: Secondary | ICD-10-CM | POA: Insufficient documentation

## 2024-08-25 DIAGNOSIS — I2510 Atherosclerotic heart disease of native coronary artery without angina pectoris: Secondary | ICD-10-CM | POA: Insufficient documentation

## 2024-08-25 DIAGNOSIS — C3492 Malignant neoplasm of unspecified part of left bronchus or lung: Secondary | ICD-10-CM | POA: Diagnosis not present

## 2024-08-25 DIAGNOSIS — C3432 Malignant neoplasm of lower lobe, left bronchus or lung: Secondary | ICD-10-CM | POA: Diagnosis not present

## 2024-08-25 DIAGNOSIS — I129 Hypertensive chronic kidney disease with stage 1 through stage 4 chronic kidney disease, or unspecified chronic kidney disease: Secondary | ICD-10-CM | POA: Diagnosis not present

## 2024-08-25 DIAGNOSIS — E538 Deficiency of other specified B group vitamins: Secondary | ICD-10-CM | POA: Diagnosis not present

## 2024-08-25 DIAGNOSIS — G629 Polyneuropathy, unspecified: Secondary | ICD-10-CM | POA: Insufficient documentation

## 2024-08-25 DIAGNOSIS — C7931 Secondary malignant neoplasm of brain: Secondary | ICD-10-CM

## 2024-08-25 DIAGNOSIS — C7951 Secondary malignant neoplasm of bone: Secondary | ICD-10-CM | POA: Insufficient documentation

## 2024-08-25 DIAGNOSIS — J449 Chronic obstructive pulmonary disease, unspecified: Secondary | ICD-10-CM | POA: Diagnosis not present

## 2024-08-25 DIAGNOSIS — Z5112 Encounter for antineoplastic immunotherapy: Secondary | ICD-10-CM | POA: Diagnosis not present

## 2024-08-25 DIAGNOSIS — M1990 Unspecified osteoarthritis, unspecified site: Secondary | ICD-10-CM | POA: Diagnosis not present

## 2024-08-25 DIAGNOSIS — Z923 Personal history of irradiation: Secondary | ICD-10-CM | POA: Insufficient documentation

## 2024-08-25 DIAGNOSIS — Z8679 Personal history of other diseases of the circulatory system: Secondary | ICD-10-CM | POA: Diagnosis not present

## 2024-08-25 DIAGNOSIS — E785 Hyperlipidemia, unspecified: Secondary | ICD-10-CM | POA: Diagnosis not present

## 2024-08-25 DIAGNOSIS — Z87891 Personal history of nicotine dependence: Secondary | ICD-10-CM | POA: Insufficient documentation

## 2024-08-25 DIAGNOSIS — Z79620 Long term (current) use of immunosuppressive biologic: Secondary | ICD-10-CM | POA: Diagnosis not present

## 2024-08-25 DIAGNOSIS — I700 Atherosclerosis of aorta: Secondary | ICD-10-CM | POA: Diagnosis not present

## 2024-08-25 DIAGNOSIS — Z85828 Personal history of other malignant neoplasm of skin: Secondary | ICD-10-CM | POA: Diagnosis not present

## 2024-08-25 DIAGNOSIS — G4730 Sleep apnea, unspecified: Secondary | ICD-10-CM | POA: Insufficient documentation

## 2024-08-25 LAB — CBC WITH DIFFERENTIAL (CANCER CENTER ONLY)
Abs Immature Granulocytes: 0.02 K/uL (ref 0.00–0.07)
Basophils Absolute: 0.1 K/uL (ref 0.0–0.1)
Basophils Relative: 1 %
Eosinophils Absolute: 0.1 K/uL (ref 0.0–0.5)
Eosinophils Relative: 1 %
HCT: 45 % (ref 39.0–52.0)
Hemoglobin: 15 g/dL (ref 13.0–17.0)
Immature Granulocytes: 0 %
Lymphocytes Relative: 24 %
Lymphs Abs: 1.5 K/uL (ref 0.7–4.0)
MCH: 30.2 pg (ref 26.0–34.0)
MCHC: 33.3 g/dL (ref 30.0–36.0)
MCV: 90.5 fL (ref 80.0–100.0)
Monocytes Absolute: 0.6 K/uL (ref 0.1–1.0)
Monocytes Relative: 9 %
Neutro Abs: 4.2 K/uL (ref 1.7–7.7)
Neutrophils Relative %: 65 %
Platelet Count: 249 K/uL (ref 150–400)
RBC: 4.97 MIL/uL (ref 4.22–5.81)
RDW: 13.8 % (ref 11.5–15.5)
WBC Count: 6.4 K/uL (ref 4.0–10.5)
nRBC: 0 % (ref 0.0–0.2)

## 2024-08-25 LAB — CMP (CANCER CENTER ONLY)
ALT: 20 U/L (ref 0–44)
AST: 25 U/L (ref 15–41)
Albumin: 4.5 g/dL (ref 3.5–5.0)
Alkaline Phosphatase: 105 U/L (ref 38–126)
Anion gap: 9 (ref 5–15)
BUN: 21 mg/dL (ref 8–23)
CO2: 29 mmol/L (ref 22–32)
Calcium: 9.8 mg/dL (ref 8.9–10.3)
Chloride: 103 mmol/L (ref 98–111)
Creatinine: 1.18 mg/dL (ref 0.61–1.24)
GFR, Estimated: >60 mL/min
Glucose, Bld: 110 mg/dL — ABNORMAL HIGH (ref 70–99)
Potassium: 4.9 mmol/L (ref 3.5–5.1)
Sodium: 141 mmol/L (ref 135–145)
Total Bilirubin: 0.4 mg/dL (ref 0.0–1.2)
Total Protein: 7 g/dL (ref 6.5–8.1)

## 2024-08-25 MED ORDER — SODIUM CHLORIDE 0.9 % IV SOLN
INTRAVENOUS | Status: DC
  Filled 2024-08-25: qty 250

## 2024-08-25 MED ORDER — SODIUM CHLORIDE 0.9 % IV SOLN
200.0000 mg | Freq: Once | INTRAVENOUS | Status: AC
  Administered 2024-08-25: 200 mg via INTRAVENOUS
  Filled 2024-08-25: qty 8

## 2024-08-25 NOTE — Patient Instructions (Signed)
 [facility] - A DEPT OF [facility]  Discharge Instructions: Thank you for choosing [facility] to provide your oncology and hematology care.  If you have a lab appointment with the Cancer Center, please go directly to the Cancer Center and check in at the registration area.  Wear comfortable clothing and clothing appropriate for easy access to any Portacath or PICC line.   We strive to give you quality time with your provider. You may need to reschedule your appointment if you arrive late (15 or more minutes).  Arriving late affects you and other patients whose appointments are after yours.  Also, if you miss three or more appointments without notifying the office, you may be dismissed from the clinic at the provider's discretion.      For prescription refill requests, have your pharmacy contact our office and allow 72 hours for refills to be completed.    Today you received the following chemotherapy and/or immunotherapy agents Rande Lawman      To help prevent nausea and vomiting after your treatment, we encourage you to take your nausea medication as directed.  BELOW ARE SYMPTOMS THAT SHOULD BE REPORTED IMMEDIATELY: *FEVER GREATER THAN 100.4 F (38 C) OR HIGHER *CHILLS OR SWEATING *NAUSEA AND VOMITING THAT IS NOT CONTROLLED WITH YOUR NAUSEA MEDICATION *UNUSUAL SHORTNESS OF BREATH *UNUSUAL BRUISING OR BLEEDING *URINARY PROBLEMS (pain or burning when urinating, or frequent urination) *BOWEL PROBLEMS (unusual diarrhea, constipation, pain near the anus) TENDERNESS IN MOUTH AND THROAT WITH OR WITHOUT PRESENCE OF ULCERS (sore throat, sores in mouth, or a toothache) UNUSUAL RASH, SWELLING OR PAIN  UNUSUAL VAGINAL DISCHARGE OR ITCHING   Items with * indicate a potential emergency and should be followed up as soon as possible or go to the Emergency Department if any problems should occur.  Please show the CHEMOTHERAPY ALERT CARD or IMMUNOTHERAPY  ALERT CARD at check-in to the Emergency Department and triage nurse.  Should you have questions after your visit or need to cancel or reschedule your appointment, please contact [facility] - A DEPT OF [facility]  [phone] and follow the prompts.  Office hours are 8:00 a.m. to 4:30 p.m. Monday - Friday. Please note that voicemails left after 4:00 p.m. may not be returned until the following business day.  We are closed weekends and major holidays. You have access to a nurse at all times for urgent questions. Please call the main number to the clinic [phone] and follow the prompts.  For any non-urgent questions, you may also contact your provider using MyChart. We now offer e-Visits for anyone 48 and older to request care online for non-urgent symptoms. For details visit [email or web address].   Also download the MyChart app! Go to the app store, search "MyChart", open the app, select [facility], and log in with your MyChart username and password.

## 2024-08-25 NOTE — Assessment & Plan Note (Signed)
 Imaging results and pathology results were reviewed and discussed with patient. 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 NGS showed TMB 53.2 high, PD-L1 <1%, SMARCB1- high TMB, Keytruda  was added on cycle 2 Previously on carboplatin , Taxol  Keytruda . Currently on Keytruda  maintenance September 2025 CT scan showed continue partial response.   Labs are reviewed and discussed with patient.   continue Keytruda  for maintenance.   December 2025 CT scan results were reviewed with patient.  Continued partial response/stable disease.  Continue current regimen.

## 2024-08-25 NOTE — Assessment & Plan Note (Signed)
 S/p Brain Radiation. Repeat brain MRI showed treatment response . He was seen by neurology Dr. Buckley.  - plan to repeat MRI brain in February 2026

## 2024-08-25 NOTE — Assessment & Plan Note (Signed)
 Keytruda  treatments as planned

## 2024-08-25 NOTE — Progress Notes (Signed)
 " Hematology/Oncology Progress note [phone] [phone]     CHIEF COMPLAINTS/PURPOSE OF CONSULTATION:  Metastatic squamous cell carcinoma  ASSESSMENT & PLAN:   Cancer Staging  Squamous cell carcinoma lung, left (HCC) Staging form: Lung, AJCC V9 - Clinical stage from 12/17/2023: Tony Mitchell, pM1c - Signed by Babara Call, MD on 12/17/2023   Squamous cell carcinoma lung, left [facility]) Imaging results and pathology results were reviewed and discussed with patient. 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 NGS showed TMB 53.2 high, PD-L1 <1%, SMARCB1- high TMB, Keytruda  was added on cycle 2 Previously on carboplatin , Taxol  Keytruda . Currently on Keytruda  maintenance September 2025 CT scan showed continue partial response.   Labs are reviewed and discussed with patient.   continue Keytruda  for maintenance.   December 2025 CT scan results were reviewed with patient.  Continued partial response/stable disease.  Continue current regimen.    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 Repeat brain MRI showed treatment response . He was seen by neurology Dr. Buckley.  - plan to repeat MRI brain in February 2026   Orders Placed This Encounter  Procedures   TSH    Standing Status:   Future    Expected Date:   09/15/2024    Expiration Date:   09/15/2025   T4    Standing Status:   Future    Expected Date:   09/15/2024    Expiration Date:   09/15/2025   CBC with Differential (Cancer Center Only)    Standing Status:   Future    Expected Date:   10/06/2024    Expiration Date:   10/06/2025   CMP (Cancer Center only)    Standing Status:   Future    Expected Date:   10/06/2024    Expiration Date:   10/06/2025   Follow up  3 weeks  All questions were answered. The patient knows to call the clinic with any problems,  questions or concerns.  Call Babara, MD, PhD [facility] Hematology Oncology 08/25/2024    HISTORY OF PRESENTING ILLNESS:  Tony Mitchell 84 y.o. male presents for follow up of stage IV lung SCC  Oncology History  Squamous cell carcinoma lung, left (HCC)  11/22/2023 Imaging   CT abdomen pelvis w contrast  1. Multiple hepatic metastatic lesions. Correlation with history of known malignancy recommended. 2. Serosal implant along the distal stomach versus gastrohepatic adenopathy. 3. Thickened appearance of the gastric antrum and pylorus likely related to underdistention. An infiltrative mass is less likely but not excluded. Endoscopy may provide better evaluation. 4. Herniation of a segment of the sigmoid colon into the left inguinal canal. No bowel obstruction.     12/04/2023 Imaging   PET scan showed  Extensive hypermetabolic neoplastic disease including left lower lobe infiltrative mass extending from the pleura to the hilum with numerous abnormal left-sided hilar and mediastinal nodes.   Additional upper retroperitoneal nodes as well as extensive liver metastases.   Multifocal osseous metastatic disease including the thoracic and lumbar spine, pelvis, right humeral head and left scapula.   Based on the overall appearance and with the patient's laboratory a abnormality this very well could be a primary left lower lobe lung lesion such as small cell versus a neuroendocrine tumor with diffuse metastatic disease.   12/10/2023 Imaging   MRI brain w wo contrast  1. 10 x 9 x 10 mm  rounded lesion in the posterior left frontal lobe with surrounding vasogenic edema is consistent with metastatic disease. 2. No other pathologic enhancement is present. 3. Periventricular and scattered subcortical T2 hyperintensities bilaterally are mildly advanced for age. The finding is nonspecific but can be seen in the setting of chronic microvascular ischemia, a demyelinating process such as  multiple sclerosis, vasculitis, complicated migraine headaches, or as the sequelae of a prior infectious or inflammatory process.   12/17/2023 Initial Diagnosis   Squamous cell carcinoma lung, left (HCC)  He began experiencing abdominal pain for a week.  Initially presenting as increased gas and stomach growling despite having eaten. The pain was localized between the rib cage and the abdomen and described as intermittent.  Later the pain had resolved, but he continues to feel bloated. He has CT abdomen pelvis done which showed liver masses.   Liver biopsy showed  1. Liver, biopsy, right hepatic lesion :      - METASTATIC POORLY DIFFERENTIATED SQUAMOUS CELL CARCINOMA WITH NECROSIS.  Diagnosis Note : Per [facility] a recent PET scan revealed a hypermetabolic left lower lobe lung mass with extensive metastatic lesions. The carcinoma is positive for cytokeratin 7, p40 (greater than 50%), and CD56 (subset). Cytokeratin 20, TTF-1,  chromogranin, synaptophysin, and CDX-2 are negative. In the absence of  chromogranin / synaptophysin staining the presence of patchy CD56 staining is interpreted as non-specific. The pattern of immunoreactivity is consistent with metastatic poorly differentiated squamous cell carcinoma.     12/17/2023 Cancer Staging   Staging form: Lung, AJCC V9 - Clinical stage from 12/17/2023: Tony Mitchell, pM1c - Signed by Babara Call, MD on 12/17/2023 Histopathologic type: Squamous cell carcinoma, NOS Stage prefix: Initial diagnosis Laterality: Left Sites of metastasis: Bone, Liver, Brain   12/23/2023 - 12/25/2023 Chemotherapy   Patient is on Treatment Plan : LUNG Carboplatin  + Paclitaxel  q21d     01/14/2024 -  Chemotherapy   Carboplatin  + Paclitaxel  + Pembrolizumab  (200) D1 q21d x 4 cycles / Pembrolizumab  (200) Maintenance D1 q21d       Imaging   CT chest abdomen pelvis w contrast  1. Nearly complete resolution of previously seen dependent left lower lobe masses and nodularity. Additional  small nonspecific pulmonary nodules unchanged. 2. No persistently enlarged mediastinal or left hilar lymph nodes. 3. Significantly diminished size of numerous hypodense liver lesions. 4. Constellation of findings is consistent with treatment response of lung malignancy and associated metastatic disease. 5. Very little CT correlate of previously seen FDG avid osseous metastatic disease; subtle lucency of the T5 vertebral body at the site of a previously FDG avid metastasis. Other lesions not clearly appreciated by CT. Nuclear scintigraphic bone scan or repeat PET-CT may be helpful to assess for residual metabolically active osseous metastatic disease. 6. Emphysema and diffuse bilateral bronchial wall thickening. 7. Coronary artery disease. 8. Large left inguinal hernia containing nonobstructed sigmoid colon.   Aortic Atherosclerosis (ICD10-I70.0) and Emphysema (ICD10-J43.9).   02/18/2024 Imaging   CT chest abdomen pelvis w contrast showed 1. Nearly complete resolution of previously seen dependent left lower lobe masses and nodularity. Additional small nonspecific pulmonary nodules unchanged. 2. No persistently enlarged mediastinal or left hilar lymph nodes. 3. Significantly diminished size of numerous hypodense liver lesions. 4. Constellation of findings is consistent with treatment response of lung malignancy and associated metastatic disease. 5. Very little CT correlate of previously seen FDG avid osseous metastatic disease; subtle lucency of the T5 vertebral body at the site of a previously FDG avid metastasis. Other lesions  not clearly appreciated by CT. Nuclear scintigraphic bone scan or repeat PET-CT may be helpful to assess for residual metabolically active osseous metastatic disease. 6. Emphysema and diffuse bilateral bronchial wall thickening. 7. Coronary artery disease. 8. Large left inguinal hernia containing nonobstructed sigmoid colon.   Aortic Atherosclerosis  (ICD10-I70.0) and Emphysema (ICD10-J43.9).     Patient denies changes in bowel habits.  Denies unintentional weight loss night sweats or fever. No history of blood transfusions or hepatitis. He is not on any blood thinners but takes aspirin  for preventive purposes.  He has a history of stage 3 renal disease diagnosed three years ago after a blood test revealed elevated potassium levels. Since then, he has made significant lifestyle changes, including stopping alcohol consumption and losing weight. He now maintains a weight between 145 and 151 pounds and walks approximately 5.6 miles daily.  He has a history of skin cancer, treated with resection, and is scheduled for further treatment. His father had prostate cancer.  Patient had a normal PSA in January 2025.   Patient lives at home with wife who has multiple medical problems.  He is the caregiver for her. His daughter passed away last year.  He has granddaughter who lives in [place] .  S/p brain radiation he finished tapering course of steroid.   + Peripheral neuropathy he takes gabapentin  100 mg 2-3 times a day. Follows up with neurology   INTERVAL HISTORY LYNELL KUSSMAN is a 84 y.o. male who has above history reviewed by me today presents for follow up visit for treatment of metastatic squamous cell lung cancer.   Discussed the use of AI scribe software for clinical note transcription with the patient, who gave verbal consent to proceed.   He reports 1 episode of right upper quadrant pain during his walking exercise.  He feels slightly lightheaded as well.  He has noticed shortness of breath when he walks fast.  He monitors his oxygen saturation.  He gets 92% during exercise.  Usually his oxygen at rest is 98 to 100%.  Pain has subsided.SABRA  He expresses feeling stressed while waiting for CT scan results.  Patient is a caregiver for his wife who has chronic respiratory failure.    MEDICAL HISTORY:  Past Medical History:  Diagnosis Date    Acute appendicitis 03/03/2018   Arthritis    Cancer (HCC)    skin cancer   Chronic kidney disease, stage 3b (HCC)    Collapsed lung 2019   x2   COPD (chronic obstructive pulmonary disease) (HCC)    Coronary artery disease    Diabetes mellitus without complication (HCC)    diet controlled-lost 60 lbs and is not on any current meds   GERD (gastroesophageal reflux disease)    Gout    Hyperlipidemia    Hypertension    Sleep apnea    uses cpap   Thoracic aortic aneurysm    Vitamin B 12 deficiency     SURGICAL HISTORY: Past Surgical History:  Procedure Laterality Date   CARDIAC CATHETERIZATION     CATARACT EXTRACTION  2012   CHEST TUBE INSERTION     x2   COLONOSCOPY WITH PROPOFOL  N/A 04/14/2021   Procedure: COLONOSCOPY WITH PROPOFOL ;  Surgeon: Maryruth Ole DASEN, MD;  Location: [facility];  Service: Endoscopy;  Laterality: N/A;   ESOPHAGOGASTRODUODENOSCOPY (EGD) WITH PROPOFOL  N/A 04/14/2021   Procedure: ESOPHAGOGASTRODUODENOSCOPY (EGD) WITH PROPOFOL ;  Surgeon: Maryruth Ole DASEN, MD;  Location: [facility];  Service: Endoscopy;  Laterality: N/A;   INSERTION OF  MESH  09/22/2021   Procedure: INSERTION OF MESH;  Surgeon: Tye Millet, DO;  Location: [facility] ORS;  Service: General;;   JOINT REPLACEMENT     KNEE SURGERY Left 1965   LAPAROSCOPIC APPENDECTOMY N/A 03/03/2018   Procedure: APPENDECTOMY LAPAROSCOPIC;  Surgeon: Nicholaus Selinda Birmingham, MD;  Location: [facility] ORS;  Service: General;  Laterality: N/A;   TOTAL KNEE ARTHROPLASTY Left 09/16/2018   Procedure: TOTAL KNEE ARTHROPLASTY;  Surgeon: Edie Norleen PARAS, MD;  Location: [facility] ORS;  Service: Orthopedics;  Laterality: Left;    SOCIAL HISTORY: Social History   Socioeconomic History   Marital status: Married    Spouse name: Not on file   Number of children: Not on file   Years of education: Not on file   Highest education level: Not on file  Occupational History   Not on file  Tobacco Use   Smoking status: Former     Current packs/day: 0.00    Average packs/day: 1 pack/day for 40.0 years (40.0 ttl pk-yrs)    Types: Cigarettes    Start date: 06/15/1956    Quit date: 06/15/1996    Years since quitting: 28.2    Passive exposure: Past   Smokeless tobacco: Never  Vaping Use   Vaping status: Never Used  Substance and Sexual Activity   Alcohol use: Not Currently    Alcohol/week: 21.0 standard drinks of alcohol    Types: 21 Shots of liquor per week    Comment: reports last drink 11/03/20   Drug use: No   Sexual activity: Yes  Other Topics Concern   Not on file  Social History Narrative   Not on file   Social Drivers of Health   Tobacco Use: Medium Risk (08/25/2024)   Patient History    Smoking Tobacco Use: Former    Smokeless Tobacco Use: Never    Passive Exposure: Past  Physicist, Medical Strain: Low Risk  (08/03/2024)   Received from [facility]   Overall Financial Resource Strain (CARDIA)    Difficulty of Paying Living Expenses: Not hard at all  Food Insecurity: No Food Insecurity (08/03/2024)   Received from [facility]   Epic    Within the past 12 months, you worried that your food would run out before you got the money to buy more.: Never true    Within the past 12 months, the food you bought just didn't last and you didn't have money to get more.: Never true  Transportation Needs: No Transportation Needs (08/03/2024)   Received from [facility] - Transportation    In the past 12 months, has lack of transportation kept you from medical appointments or from getting medications?: No    Lack of Transportation (Non-Medical): No  Physical Activity: Not on file  Stress: Not on file  Social Connections: Not on file  Intimate Partner Violence: Not on file  Depression (PHQ2-9): Low Risk (06/16/2024)   Depression (PHQ2-9)    PHQ-2 Score: 0  Alcohol Screen: Not on file  Housing: Low Risk  (08/10/2024)   Received from [place]   Epic    In the last 12 months, was there a time when you were not able to pay the mortgage or rent on time?: No    In the past 12 months, how many times have you moved where you were living?: 0    At any time in the past 12 months, were you homeless or living in  a shelter (including now)?: No  Utilities: Not At Risk (08/03/2024)   Received from [facility]   Epic    In the past 12 months has the electric, gas, oil, or water  company threatened to shut off services in your home?: No  Health Literacy: Not on file    FAMILY HISTORY: Family History  Problem Relation Age of Onset   Cancer Father        Unknown   Prostate cancer Father    Lung cancer Neg Hx     ALLERGIES:  has no known allergies.  MEDICATIONS:  Current Outpatient Medications  Medication Sig Dispense Refill   allopurinol  (ZYLOPRIM ) 100 MG tablet Take 100 mg by mouth in the morning and at bedtime.     ALPRAZolam  (XANAX ) 0.5 MG tablet Take 0.5 mg by mouth at bedtime.     amLODipine  (NORVASC ) 2.5 MG tablet Take 2.5 mg by mouth in the morning.     colchicine 0.6 MG tablet Take 2 tablets (1.2mg ) by mouth at first sign of gout flare followed by 1 tablet (0.6mg ) after 1 hour. (Max 1.8mg  within 1 hour)     fenofibrate  (TRICOR ) 145 MG tablet Take 145 mg by mouth 2 (two) times a week. On Wednesday and Sunday     finasteride  (PROSCAR ) 5 MG tablet Take 1 tablet (5 mg total) by mouth daily. 90 tablet 3   furosemide  (LASIX ) 20 MG tablet Take 1 tablet (20 mg total) by mouth daily as needed. 30 tablet 0   gabapentin  (NEURONTIN ) 300 MG capsule Take 1 capsule (300 mg total) by mouth 3 (three) times daily. 90 capsule 2   hydrALAZINE  (APRESOLINE ) 25 MG tablet Take 25 mg by mouth 2 (two) times daily.     lansoprazole (PREVACID) 15 MG capsule Take 15 mg by mouth every evening.     simvastatin  (ZOCOR ) 20 MG tablet Take 1 tablet by mouth 2 (two) times a week. On  Wednesday and Sunday     tamsulosin   (FLOMAX ) 0.4 MG CAPS capsule Take 1 capsule (0.4 mg total) by mouth daily. 30 capsule 11   traMADol  (ULTRAM ) 50 MG tablet Take 1 tablet (50 mg total) by mouth every 12 (twelve) hours as needed. 30 tablet 0   triamcinolone  ointment (KENALOG ) 0.5 % Apply 1 Application topically 2 (two) times daily. 30 g 0   vitamin B-12 (CYANOCOBALAMIN ) 1000 MCG tablet Take 1,000 mcg by mouth every morning.     Vitamin D, Ergocalciferol, (DRISDOL) 1.25 MG (50000 UNIT) CAPS capsule Take 50,000 Units by mouth.     No current facility-administered medications for this visit.   Facility-Administered Medications Ordered in Other Visits  Medication Dose Route Frequency Provider Last Rate Last Admin   0.9 %  sodium chloride  infusion   Intravenous Continuous Babara Call, MD 10 mL/hr at 02/04/24 0921 New Bag at 02/04/24 0921   0.9 %  sodium chloride  infusion   Intravenous Continuous Babara Call, MD       heparin  lock flush 100 unit/mL  500 Units Intracatheter Once PRN Babara Call, MD       pembrolizumab  (KEYTRUDA ) 200 mg in sodium chloride  0.9 % 50 mL chemo infusion  200 mg Intravenous Once Babara Call, MD        Review of Systems  Constitutional:  Negative for appetite change, chills, fatigue, fever and unexpected weight change.  HENT:   Negative for hearing loss and voice change.   Eyes:  Negative for eye problems and icterus.  Respiratory:  Positive for shortness of breath. Negative for chest tightness and cough.   Cardiovascular:  Negative for chest pain and leg swelling.  Gastrointestinal:  Negative for abdominal distention and abdominal pain.  Endocrine: Negative for hot flashes.  Genitourinary:  Negative for difficulty urinating, dysuria and frequency.   Musculoskeletal:  Positive for arthralgias.  Skin:  Negative for itching and rash.  Neurological:  Positive for numbness. Negative for light-headedness.  Hematological:  Negative for adenopathy. Does not bruise/bleed easily.  Psychiatric/Behavioral:  Negative for  confusion.      PHYSICAL EXAMINATION: ECOG PERFORMANCE STATUS: 1 - Symptomatic but completely ambulatory  Vitals:   08/25/24 0854 08/25/24 0903  BP: (!) 162/77 (!) 157/73  Pulse: 69   Resp: 18   Temp: (!) 97 F (36.1 C)   SpO2: 97%    Filed Weights   08/25/24 0854  Weight: 160 lb 11.2 oz (72.9 kg)    Physical Exam Constitutional:      General: He is not in acute distress.    Appearance: He is not diaphoretic.  HENT:     Head: Normocephalic and atraumatic.  Eyes:     General: No scleral icterus. Cardiovascular:     Rate and Rhythm: Normal rate and regular rhythm.  Pulmonary:     Effort: Pulmonary effort is normal. No respiratory distress.     Breath sounds: Normal breath sounds.  Abdominal:     General: Bowel sounds are normal. There is no distension.     Palpations: Abdomen is soft.  Musculoskeletal:        General: Normal range of motion.     Cervical back: Normal range of motion and neck supple.  Skin:    General: Skin is warm and dry.     Findings: No erythema.  Neurological:     Mental Status: He is alert and oriented to person, place, and time. Mental status is at baseline.     Cranial Nerves: No cranial nerve deficit.     Motor: No abnormal muscle tone.  Psychiatric:        Mood and Affect: Affect normal.      LABORATORY DATA:  I have reviewed the data as listed    Latest Ref Rng & Units 08/25/2024    8:40 AM 07/28/2024    8:51 AM 07/07/2024    8:45 AM  CBC  WBC 4.0 - 10.5 K/uL 6.4  7.4  6.2   Hemoglobin 13.0 - 17.0 g/dL 84.9  84.2  85.3   Hematocrit 39.0 - 52.0 % 45.0  47.0  44.2   Platelets 150 - 400 K/uL 249  183  223       Latest Ref Rng & Units 08/25/2024    8:40 AM 07/28/2024    8:51 AM 07/07/2024    8:45 AM  CMP  Glucose 70 - 99 mg/dL 889  897  93   BUN 8 - 23 mg/dL 21  17  18    Creatinine 0.61 - 1.24 mg/dL 8.81  9.03  9.03   Sodium 135 - 145 mmol/L 141  140  139   Potassium 3.5 - 5.1 mmol/L 4.9  4.3  4.3   Chloride 98 - 111 mmol/L  103  100  106   CO2 22 - 32 mmol/L 29  28  22    Calcium 8.9 - 10.3 mg/dL 9.8  9.7  9.3   Total Protein 6.5 - 8.1 g/dL 7.0  6.8  6.7   Total Bilirubin 0.0 - 1.2 mg/dL 0.4  0.6  0.7   Alkaline Phos 38 - 126 U/L 105  92  91   AST 15 - 41 U/L 25  19  25    ALT 0 - 44 U/L 20  19  17       RADIOGRAPHIC STUDIES: I have personally reviewed the radiological images as listed and agreed with the findings in the report. CT CHEST ABDOMEN PELVIS W CONTRAST Result Date: 08/24/2024 EXAM: CT CHEST, ABDOMEN AND PELVIS WITH CONTRAST 08/17/2024 11:58:13 AM TECHNIQUE: CT of the chest, abdomen and pelvis was performed with the administration of 100 mL of iohexol  (OMNIPAQUE ) 300 MG/ML solution intravenous contrast. Multiplanar reformatted images are provided for review. Automated exposure control, iterative reconstruction, and/or weight based adjustment of the mA/kV was utilized to reduce the radiation dose to as low as reasonably achievable. COMPARISON: CT chest abdomen and pelvis 05/13/2024. CLINICAL HISTORY: Squamous cell carcinoma of lung, Squamous cell carcinoma lung, left. * Tracking Code: BO * FINDINGS: CHEST: MEDIASTINUM AND LYMPH NODES: Heart and pericardium are unremarkable. The central airways are clear. No mediastinal, hilar or axillary lymphadenopathy. No supraclavicular adenopathy. LUNGS AND PLEURA: No Residual mass lesion in the left lower lobe. No measurable lesion. Small right lower lobe pulmonary nodule measuring 3 mm on image 81, unchanged from prior. No new pulmonary nodules. No pleural effusion. No pneumothorax. ABDOMEN AND PELVIS: LIVER: Multiple hypodense lesions in the left and right hepatic lobes, not changed in size or number compared to prior. Example lesion in the right hepatic lobe measuring 11 x 7 mm on image 60 compared to 12 x 11 mm on prior. Lesion of the posterior right hepatic lobe measuring 13 mm on image 65 compared to 13 mm. Lesions are predominantly located in the right hepatic lobe.  Portal veins are patent. No periportal lymphadenopathy. GALLBLADDER AND BILE DUCTS: Unremarkable. No biliary ductal dilatation. SPLEEN: No acute abnormality. PANCREAS: No acute abnormality. ADRENAL GLANDS: The adrenal glands are normal. KIDNEYS, URETERS AND BLADDER: No stones in the kidneys or ureters. No hydronephrosis. No perinephric or periureteral stranding. Urinary bladder is unremarkable. GI AND BOWEL: Stomach demonstrates no acute abnormality. There is no bowel obstruction. Finding of a large left inguinal hernia which contains a long segment of nonobstructed descending colon. REPRODUCTIVE ORGANS: No acute abnormality. PERITONEUM AND RETROPERITONEUM: No ascites. No free air. There are small lymph nodes in the retroperitoneum along the aorta which are not changed from prior. For example, a small node positioned between the IVC and the aorta on image 74 measures 7 mm compared to 7 mm. Similar small lymph nodes left of the aorta on image 73 unchanged. VASCULATURE: Aorta is normal in caliber. ABDOMINAL AND PELVIS LYMPH NODES: No other significant abdominal or pelvic lymphadenopathy. BONES AND SOFT TISSUES: No acute osseous abnormality. No focal soft tissue abnormality. IMPRESSION: 1. No measurable residual mass in the left lower lobe, with no new pulmonary nodules or thoracic adenopathy. 2. Stable multiple hepatic hypodense lesions. No progressive liver disease. 3.  Stabel small retroperitoneal lymph nodes, 4. no new evidence of metastatic disease in the chest, abdomen, or pelvis. 5. Large left inguinal hernia containing a long segment of nonobstructed descending colon. Electronically signed by: Norleen Boxer MD 08/24/2024 05:07 PM EST RP Workstation: [id number]      "

## 2024-08-27 ENCOUNTER — Other Ambulatory Visit: Payer: Self-pay | Admitting: Oncology

## 2024-08-31 ENCOUNTER — Telehealth: Payer: Self-pay | Admitting: Genetic Counselor

## 2024-08-31 NOTE — Telephone Encounter (Signed)
 Molecular Testing Review Molecular test report from [facility] reviewed by dentist.   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   Please discuss with patient and refer to genetic counseling if interested.   Burnard Ogren, MS, [facility] Licensed, [email or web address] phone: [phone]

## 2024-09-07 ENCOUNTER — Telehealth: Payer: Self-pay | Admitting: Oncology

## 2024-09-07 NOTE — Telephone Encounter (Signed)
 Patient left voicemail to change appointment to inperson on day of other appointments.   Appointment changed and patient has been notified

## 2024-09-08 ENCOUNTER — Inpatient Hospital Stay: Admitting: Hospice and Palliative Medicine

## 2024-09-15 ENCOUNTER — Inpatient Hospital Stay

## 2024-09-15 ENCOUNTER — Encounter: Payer: Self-pay | Admitting: Oncology

## 2024-09-15 ENCOUNTER — Inpatient Hospital Stay: Admitting: Hospice and Palliative Medicine

## 2024-09-15 ENCOUNTER — Inpatient Hospital Stay: Admitting: Oncology

## 2024-09-15 VITALS — BP 133/72 | HR 70

## 2024-09-15 VITALS — BP 129/69 | HR 74 | Temp 97.5°F | Resp 18 | Wt 159.0 lb

## 2024-09-15 DIAGNOSIS — C3492 Malignant neoplasm of unspecified part of left bronchus or lung: Secondary | ICD-10-CM

## 2024-09-15 DIAGNOSIS — C7931 Secondary malignant neoplasm of brain: Secondary | ICD-10-CM

## 2024-09-15 DIAGNOSIS — Z5112 Encounter for antineoplastic immunotherapy: Secondary | ICD-10-CM

## 2024-09-15 DIAGNOSIS — Z515 Encounter for palliative care: Secondary | ICD-10-CM

## 2024-09-15 LAB — CMP (CANCER CENTER ONLY)
ALT: 16 U/L (ref 0–44)
AST: 23 U/L (ref 15–41)
Albumin: 4.7 g/dL (ref 3.5–5.0)
Alkaline Phosphatase: 91 U/L (ref 38–126)
Anion gap: 12 (ref 5–15)
BUN: 16 mg/dL (ref 8–23)
CO2: 26 mmol/L (ref 22–32)
Calcium: 10.1 mg/dL (ref 8.9–10.3)
Chloride: 103 mmol/L (ref 98–111)
Creatinine: 0.97 mg/dL (ref 0.61–1.24)
GFR, Estimated: >60 mL/min
Glucose, Bld: 85 mg/dL (ref 70–99)
Potassium: 4.8 mmol/L (ref 3.5–5.1)
Sodium: 142 mmol/L (ref 135–145)
Total Bilirubin: 0.6 mg/dL (ref 0.0–1.2)
Total Protein: 7.3 g/dL (ref 6.5–8.1)

## 2024-09-15 LAB — CBC WITH DIFFERENTIAL (CANCER CENTER ONLY)
Abs Immature Granulocytes: 0.03 10*3/uL (ref 0.00–0.07)
Basophils Absolute: 0.1 10*3/uL (ref 0.0–0.1)
Basophils Relative: 1 %
Eosinophils Absolute: 0.1 10*3/uL (ref 0.0–0.5)
Eosinophils Relative: 1 %
HCT: 46 % (ref 39.0–52.0)
Hemoglobin: 15.4 g/dL (ref 13.0–17.0)
Immature Granulocytes: 0 %
Lymphocytes Relative: 31 %
Lymphs Abs: 2.6 10*3/uL (ref 0.7–4.0)
MCH: 30.1 pg (ref 26.0–34.0)
MCHC: 33.5 g/dL (ref 30.0–36.0)
MCV: 89.8 fL (ref 80.0–100.0)
Monocytes Absolute: 0.5 10*3/uL (ref 0.1–1.0)
Monocytes Relative: 6 %
Neutro Abs: 5.2 10*3/uL (ref 1.7–7.7)
Neutrophils Relative %: 61 %
Platelet Count: 281 10*3/uL (ref 150–400)
RBC: 5.12 MIL/uL (ref 4.22–5.81)
RDW: 13.1 % (ref 11.5–15.5)
WBC Count: 8.5 10*3/uL (ref 4.0–10.5)
nRBC: 0 % (ref 0.0–0.2)

## 2024-09-15 LAB — TSH: TSH: 0.786 u[IU]/mL (ref 0.350–4.500)

## 2024-09-15 MED ORDER — SODIUM CHLORIDE 0.9 % IV SOLN
200.0000 mg | Freq: Once | INTRAVENOUS | Status: AC
  Administered 2024-09-15: 200 mg via INTRAVENOUS
  Filled 2024-09-15: qty 8

## 2024-09-15 MED ORDER — SODIUM CHLORIDE 0.9 % IV SOLN
INTRAVENOUS | Status: DC
  Filled 2024-09-15: qty 250

## 2024-09-15 NOTE — Progress Notes (Signed)
 "    Palliative Medicine [facility] at [facility] Telephone:([phone]) [phone] Fax:([phone]) [phone]   Name: Tony Mitchell Date: 09/15/2024 MRN: [id number]  DOB: 08-May-1941  Patient Care Team: Sadie Manna, MD as PCP - General (Internal Medicine) Babara Call, MD as Consulting Physician (Oncology) Verdene Gills, RN as Oncology Nurse Navigator    REASON FOR CONSULTATION: Tony Mitchell is a 84 y.o. 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   SOCIAL HISTORY:     reports that he quit smoking about 28 years ago. His smoking use included cigarettes. He started smoking about 68 years ago. He has a 40 pack-year smoking history. He has been exposed to tobacco smoke. He has never used smokeless tobacco. He reports that he does not currently use alcohol after a past usage of about 21.0 standard drinks of alcohol per week. He reports that he does not use drugs.  Patient is married and lives at home with his wife and two beagles.  His only daughter died in 15-Jul-2023.  Patient has a granddaughter in [place] .  Patient owned a pool business for 50 years.  ADVANCE DIRECTIVES:  Not on file  CODE STATUS:   PAST MEDICAL HISTORY: Past Medical History:  Diagnosis Date   Acute appendicitis 03/03/2018   Arthritis    Cancer (HCC)    skin cancer   Chronic kidney disease, stage 3b (HCC)    Collapsed lung 2019   x2   COPD (chronic obstructive pulmonary disease) (HCC)    Coronary artery disease    Diabetes mellitus without complication (HCC)    diet controlled-lost 60 lbs and is not on any current meds   GERD (gastroesophageal reflux disease)    Gout    Hyperlipidemia    Hypertension    Sleep apnea    uses cpap   Thoracic aortic aneurysm    Vitamin B 12 deficiency     PAST SURGICAL HISTORY:  Past Surgical History:  Procedure Laterality Date   CARDIAC  CATHETERIZATION     CATARACT EXTRACTION  2012   CHEST TUBE INSERTION     x2   COLONOSCOPY WITH PROPOFOL  N/A 04/14/2021   Procedure: COLONOSCOPY WITH PROPOFOL ;  Surgeon: Maryruth Ole DASEN, MD;  Location: [facility] ENDOSCOPY;  Service: Endoscopy;  Laterality: N/A;   ESOPHAGOGASTRODUODENOSCOPY (EGD) WITH PROPOFOL  N/A 04/14/2021   Procedure: ESOPHAGOGASTRODUODENOSCOPY (EGD) WITH PROPOFOL ;  Surgeon: Maryruth Ole DASEN, MD;  Location: [facility] ENDOSCOPY;  Service: Endoscopy;  Laterality: N/A;   INSERTION OF MESH  09/22/2021   Procedure: INSERTION OF MESH;  Surgeon: Tye Millet, DO;  Location: [facility] ORS;  Service: General;;   JOINT REPLACEMENT     KNEE SURGERY Left 1965   LAPAROSCOPIC APPENDECTOMY N/A 03/03/2018   Procedure: APPENDECTOMY LAPAROSCOPIC;  Surgeon: Nicholaus Selinda Birmingham, MD;  Location: [facility] ORS;  Service: General;  Laterality: N/A;   TOTAL KNEE ARTHROPLASTY Left 09/16/2018   Procedure: TOTAL KNEE ARTHROPLASTY;  Surgeon: Edie Norleen JINNY, MD;  Location: [facility] ORS;  Service: Orthopedics;  Laterality: Left;    HEMATOLOGY/ONCOLOGY HISTORY:  Oncology History  Squamous cell carcinoma lung, left (HCC)  11/22/2023 Imaging   CT abdomen pelvis w contrast  1. Multiple hepatic metastatic lesions. Correlation with history of known malignancy recommended. 2. Serosal implant along the distal stomach versus gastrohepatic adenopathy. 3. Thickened appearance of the gastric antrum and pylorus likely related to underdistention. An infiltrative mass is less likely but not excluded.  Endoscopy may provide better evaluation. 4. Herniation of a segment of the sigmoid colon into the left inguinal canal. No bowel obstruction.     12/04/2023 Imaging   PET scan showed  Extensive hypermetabolic neoplastic disease including left lower lobe infiltrative mass extending from the pleura to the hilum with numerous abnormal left-sided hilar and mediastinal nodes.   Additional upper retroperitoneal nodes as well as extensive  liver metastases.   Multifocal osseous metastatic disease including the thoracic and lumbar spine, pelvis, right humeral head and left scapula.   Based on the overall appearance and with the patient's laboratory a abnormality this very well could be a primary left lower lobe lung lesion such as small cell versus a neuroendocrine tumor with diffuse metastatic disease.   12/10/2023 Imaging   MRI brain w wo contrast  1. 10 x 9 x 10 mm rounded lesion in the posterior left frontal lobe with surrounding vasogenic edema is consistent with metastatic disease. 2. No other pathologic enhancement is present. 3. Periventricular and scattered subcortical T2 hyperintensities bilaterally are mildly advanced for age. The finding is nonspecific but can be seen in the setting of chronic microvascular ischemia, a demyelinating process such as multiple sclerosis, vasculitis, complicated migraine headaches, or as the sequelae of a prior infectious or inflammatory process.   12/17/2023 Initial Diagnosis   Squamous cell carcinoma lung, left (HCC)  He began experiencing abdominal pain for a week.  Initially presenting as increased gas and stomach growling despite having eaten. The pain was localized between the rib cage and the abdomen and described as intermittent.  Later the pain had resolved, but he continues to feel bloated. He has CT abdomen pelvis done which showed liver masses.   Liver biopsy showed  1. Liver, biopsy, right hepatic lesion :      - METASTATIC POORLY DIFFERENTIATED SQUAMOUS CELL CARCINOMA WITH NECROSIS.  Diagnosis Note : Per [facility] a recent PET scan revealed a hypermetabolic left lower lobe lung mass with extensive metastatic lesions. The carcinoma is positive for cytokeratin 7, p40 (greater than 50%), and CD56 (subset). Cytokeratin 20, TTF-1,  chromogranin, synaptophysin, and CDX-2 are negative. In the absence of  chromogranin / synaptophysin staining the presence of patchy CD56 staining  is interpreted as non-specific. The pattern of immunoreactivity is consistent with metastatic poorly differentiated squamous cell carcinoma.     12/17/2023 Cancer Staging   Staging form: Lung, AJCC V9 - Clinical stage from 12/17/2023: [name], pM1c - Signed by Babara Call, MD on 12/17/2023 Histopathologic type: Squamous cell carcinoma, NOS Stage prefix: Initial diagnosis Laterality: Left Sites of metastasis: Bone, Liver, Brain   12/23/2023 - 12/25/2023 Chemotherapy   Patient is on Treatment Plan : LUNG Carboplatin  + Paclitaxel  q21d     01/14/2024 -  Chemotherapy   Carboplatin  + Paclitaxel  + Pembrolizumab  (200) D1 q21d x 4 cycles / Pembrolizumab  (200) Maintenance D1 q21d       Imaging   CT chest abdomen pelvis w contrast  1. Nearly complete resolution of previously seen dependent left lower lobe masses and nodularity. Additional small nonspecific pulmonary nodules unchanged. 2. No persistently enlarged mediastinal or left hilar lymph nodes. 3. Significantly diminished size of numerous hypodense liver lesions. 4. Constellation of findings is consistent with treatment response of lung malignancy and associated metastatic disease. 5. Very little CT correlate of previously seen FDG avid osseous metastatic disease; subtle lucency of the T5 vertebral body at the site of a previously FDG avid metastasis. Other lesions not clearly appreciated by CT.  Nuclear scintigraphic bone scan or repeat PET-CT may be helpful to assess for residual metabolically active osseous metastatic disease. 6. Emphysema and diffuse bilateral bronchial wall thickening. 7. Coronary artery disease. 8. Large left inguinal hernia containing nonobstructed sigmoid colon.   Aortic Atherosclerosis (ICD10-I70.0) and Emphysema (ICD10-J43.9).   02/18/2024 Imaging   CT chest abdomen pelvis w contrast showed 1. Nearly complete resolution of previously seen dependent left lower lobe masses and nodularity. Additional small  nonspecific pulmonary nodules unchanged. 2. No persistently enlarged mediastinal or left hilar lymph nodes. 3. Significantly diminished size of numerous hypodense liver lesions. 4. Constellation of findings is consistent with treatment response of lung malignancy and associated metastatic disease. 5. Very little CT correlate of previously seen FDG avid osseous metastatic disease; subtle lucency of the T5 vertebral body at the site of a previously FDG avid metastasis. Other lesions not clearly appreciated by CT. Nuclear scintigraphic bone scan or repeat PET-CT may be helpful to assess for residual metabolically active osseous metastatic disease. 6. Emphysema and diffuse bilateral bronchial wall thickening. 7. Coronary artery disease. 8. Large left inguinal hernia containing nonobstructed sigmoid colon.   Aortic Atherosclerosis (ICD10-I70.0) and Emphysema (ICD10-J43.9).     ALLERGIES:  has no known allergies.  MEDICATIONS:  Current Outpatient Medications  Medication Sig Dispense Refill   allopurinol  (ZYLOPRIM ) 100 MG tablet Take 100 mg by mouth in the morning and at bedtime.     ALPRAZolam  (XANAX ) 0.5 MG tablet Take 0.5 mg by mouth at bedtime.     amLODipine  (NORVASC ) 2.5 MG tablet Take 2.5 mg by mouth in the morning.     colchicine 0.6 MG tablet Take 2 tablets (1.2mg ) by mouth at first sign of gout flare followed by 1 tablet (0.6mg ) after 1 hour. (Max 1.8mg  within 1 hour)     fenofibrate  (TRICOR ) 145 MG tablet Take 145 mg by mouth 2 (two) times a week. On Wednesday and Sunday     finasteride  (PROSCAR ) 5 MG tablet Take 1 tablet (5 mg total) by mouth daily. 90 tablet 3   furosemide  (LASIX ) 20 MG tablet Take 1 tablet (20 mg total) by mouth daily as needed. 30 tablet 0   gabapentin  (NEURONTIN ) 300 MG capsule TAKE 1 CAPSULE(300 MG) BY MOUTH THREE TIMES DAILY 90 capsule 2   hydrALAZINE  (APRESOLINE ) 25 MG tablet Take 25 mg by mouth 2 (two) times daily.     lansoprazole (PREVACID) 15 MG  capsule Take 15 mg by mouth every evening.     simvastatin  (ZOCOR ) 20 MG tablet Take 1 tablet by mouth 2 (two) times a week. On  Wednesday and Sunday     tamsulosin  (FLOMAX ) 0.4 MG CAPS capsule Take 1 capsule (0.4 mg total) by mouth daily. 30 capsule 11   traMADol  (ULTRAM ) 50 MG tablet Take 1 tablet (50 mg total) by mouth every 12 (twelve) hours as needed. 30 tablet 0   vitamin B-12 (CYANOCOBALAMIN ) 1000 MCG tablet Take 1,000 mcg by mouth every morning.     Vitamin D, Ergocalciferol, (DRISDOL) 1.25 MG (50000 UNIT) CAPS capsule Take 50,000 Units by mouth.     No current facility-administered medications for this visit.   Facility-Administered Medications Ordered in Other Visits  Medication Dose Route Frequency Provider Last Rate Last Admin   0.9 %  sodium chloride  infusion   Intravenous Continuous Babara Call, MD 10 mL/hr at 02/04/24 0921 New Bag at 02/04/24 0921   0.9 %  sodium chloride  infusion   Intravenous Continuous Babara Call, MD 10 mL/hr at 09/15/24  1508 Infusion Verify at 09/15/24 1508   heparin  lock flush 100 unit/mL  500 Units Intracatheter Once PRN Babara Call, MD       pembrolizumab  (KEYTRUDA ) 200 mg in sodium chloride  0.9 % 50 mL chemo infusion  200 mg Intravenous Once Babara Call, MD        VITAL SIGNS: There were no vitals taken for this visit. There were no vitals filed for this visit.  Estimated body mass index is 24.38 kg/m as calculated from the following:   Height as of 06/04/24: 5' 7.72 (1.72 m).   Weight as of an earlier encounter on 09/15/24: 159 lb (72.1 kg).  LABS: CBC:    Component Value Date/Time   WBC 8.5 09/15/2024 1345   WBC 6.0 12/12/2023 1015   HGB 15.4 09/15/2024 1345   HGB 13.3 05/27/2014 1712   HCT 46.0 09/15/2024 1345   HCT 40.9 05/27/2014 1712   PLT 281 09/15/2024 1345   PLT 198 05/27/2014 1712   MCV 89.8 09/15/2024 1345   MCV 92 05/27/2014 1712   NEUTROABS 5.2 09/15/2024 1345   LYMPHSABS 2.6 09/15/2024 1345   MONOABS 0.5 09/15/2024 1345   EOSABS 0.1  09/15/2024 1345   BASOSABS 0.1 09/15/2024 1345   Comprehensive Metabolic Panel:    Component Value Date/Time   NA 142 09/15/2024 1345   NA 140 05/27/2014 1712   K 4.8 09/15/2024 1345   K 3.8 05/27/2014 1712   CL 103 09/15/2024 1345   CL 105 05/27/2014 1712   CO2 26 09/15/2024 1345   CO2 28 05/27/2014 1712   BUN 16 09/15/2024 1345   BUN 11 05/27/2014 1712   CREATININE 0.97 09/15/2024 1345   CREATININE 1.02 05/27/2014 1712   GLUCOSE 85 09/15/2024 1345   GLUCOSE 120 (H) 05/27/2014 1712   CALCIUM 10.1 09/15/2024 1345   CALCIUM 8.2 (L) 05/27/2014 1712   AST 23 09/15/2024 1345   ALT 16 09/15/2024 1345   ALKPHOS 91 09/15/2024 1345   BILITOT 0.6 09/15/2024 1345   PROT 7.3 09/15/2024 1345   ALBUMIN 4.7 09/15/2024 1345    RADIOGRAPHIC STUDIES: CT CHEST ABDOMEN PELVIS W CONTRAST Result Date: 08/24/2024 EXAM: CT CHEST, ABDOMEN AND PELVIS WITH CONTRAST 08/17/2024 11:58:13 AM TECHNIQUE: CT of the chest, abdomen and pelvis was performed with the administration of 100 mL of iohexol  (OMNIPAQUE ) 300 MG/ML solution intravenous contrast. Multiplanar reformatted images are provided for review. Automated exposure control, iterative reconstruction, and/or weight based adjustment of the mA/kV was utilized to reduce the radiation dose to as low as reasonably achievable. COMPARISON: CT chest abdomen and pelvis 05/13/2024. CLINICAL HISTORY: Squamous cell carcinoma of lung, Squamous cell carcinoma lung, left. * Tracking Code: BO * FINDINGS: CHEST: MEDIASTINUM AND LYMPH NODES: Heart and pericardium are unremarkable. The central airways are clear. No mediastinal, hilar or axillary lymphadenopathy. No supraclavicular adenopathy. LUNGS AND PLEURA: No Residual mass lesion in the left lower lobe. No measurable lesion. Small right lower lobe pulmonary nodule measuring 3 mm on image 81, unchanged from prior. No new pulmonary nodules. No pleural effusion. No pneumothorax. ABDOMEN AND PELVIS: LIVER: Multiple hypodense  lesions in the left and right hepatic lobes, not changed in size or number compared to prior. Example lesion in the right hepatic lobe measuring 11 x 7 mm on image 60 compared to 12 x 11 mm on prior. Lesion of the posterior right hepatic lobe measuring 13 mm on image 65 compared to 13 mm. Lesions are predominantly located in the right hepatic lobe. Portal veins  are patent. No periportal lymphadenopathy. GALLBLADDER AND BILE DUCTS: Unremarkable. No biliary ductal dilatation. SPLEEN: No acute abnormality. PANCREAS: No acute abnormality. ADRENAL GLANDS: The adrenal glands are normal. KIDNEYS, URETERS AND BLADDER: No stones in the kidneys or ureters. No hydronephrosis. No perinephric or periureteral stranding. Urinary bladder is unremarkable. GI AND BOWEL: Stomach demonstrates no acute abnormality. There is no bowel obstruction. Finding of a large left inguinal hernia which contains a long segment of nonobstructed descending colon. REPRODUCTIVE ORGANS: No acute abnormality. PERITONEUM AND RETROPERITONEUM: No ascites. No free air. There are small lymph nodes in the retroperitoneum along the aorta which are not changed from prior. For example, a small node positioned between the IVC and the aorta on image 74 measures 7 mm compared to 7 mm. Similar small lymph nodes left of the aorta on image 73 unchanged. VASCULATURE: Aorta is normal in caliber. ABDOMINAL AND PELVIS LYMPH NODES: No other significant abdominal or pelvic lymphadenopathy. BONES AND SOFT TISSUES: No acute osseous abnormality. No focal soft tissue abnormality. IMPRESSION: 1. No measurable residual mass in the left lower lobe, with no new pulmonary nodules or thoracic adenopathy. 2. Stable multiple hepatic hypodense lesions. No progressive liver disease. 3.  Stabel small retroperitoneal lymph nodes, 4. no new evidence of metastatic disease in the chest, abdomen, or pelvis. 5. Large left inguinal hernia containing a long segment of nonobstructed descending  colon. Electronically signed by: Norleen Boxer MD 08/24/2024 05:07 PM EST RP Workstation: [id number]     PERFORMANCE STATUS (ECOG) : 1 - Symptomatic but completely ambulatory  Review of Systems Unless otherwise noted, a complete review of systems is negative.  Physical Exam General: NAD Pulmonary: Unlabored Extremities: no edema, no joint deformities Skin: no rashes Neurological: nonfocal  IMPRESSION: Follow-up visit.  Patient seen in infusion.  Patient reports he is doing reasonably well.  Denies significant changes or concerns.  Patient continues to be followed by orthopedics for bilateral shoulder pain with some improvement after steroid injections.  Patient occasionally takes tramadol  as needed for pain.  Patient mains in agreement with current scope of treatment.  CTs stable.  PLAN: - Continue current scope of treatment - Continue tramadol  as needed  - MOST Form previously reviewed - Follow-up visit 2 months  Patient expressed understanding and was in agreement with this plan. He also understands that He can call the clinic at any time with any questions, concerns, or complaints.     Time Total: 15 minutes  Visit consisted of counseling and education dealing with the complex and emotionally intense issues of symptom management and palliative care in the setting of serious and potentially life-threatening illness.Greater than 50%  of this time was spent counseling and coordinating care related to the above assessment and plan.  Signed by: Fonda Mower, PhD, NP-C   "

## 2024-09-15 NOTE — Progress Notes (Signed)
 " Hematology/Oncology Progress note [phone] [phone]     CHIEF COMPLAINTS/PURPOSE OF CONSULTATION:  Metastatic squamous cell carcinoma  ASSESSMENT & PLAN:   Cancer Staging  Squamous cell carcinoma lung, left (HCC) Staging form: Lung, AJCC V9 - Clinical stage from 12/17/2023: Tony Mitchell, pM1c - Signed by Babara Call, MD on 12/17/2023   Squamous cell carcinoma lung, left [facility]) Imaging results and pathology results were reviewed and discussed with patient. 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 NGS showed TMB 53.2 high, PD-L1 <1%, SMARCB1- high TMB, Keytruda  was added on cycle 2 Previously on carboplatin , Taxol  Keytruda . Currently on Keytruda  maintenance September 2025 CT scan showed continue partial response.   Labs are reviewed and discussed with patient.   continue Keytruda  for maintenance.   December 2025 CT scan results were reviewed with patient.  Continued partial response/stable disease.  Continue current regimen.    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 Repeat brain MRI showed treatment response . He was seen by neurology Dr. Buckley.  - plan to repeat MRI brain in February 2026   Orders Placed This Encounter  Procedures   CBC with Differential (Cancer Center Only)    Standing Status:   Future    Expected Date:   10/27/2024    Expiration Date:   10/27/2025   CMP (Cancer Center only)    Standing Status:   Future    Expected Date:   10/27/2024    Expiration Date:   10/27/2025   Follow up  3 weeks  All questions were answered. The patient knows to call the clinic with any problems, questions or concerns.  Call Babara, MD, PhD [facility] Hematology Oncology 09/15/2024    HISTORY OF PRESENTING ILLNESS:  Tony Mitchell 84 y.o. male presents for follow up of stage IV lung  SCC  Oncology History  Squamous cell carcinoma lung, left (HCC)  11/22/2023 Imaging   CT abdomen pelvis w contrast  1. Multiple hepatic metastatic lesions. Correlation with history of known malignancy recommended. 2. Serosal implant along the distal stomach versus gastrohepatic adenopathy. 3. Thickened appearance of the gastric antrum and pylorus likely related to underdistention. An infiltrative mass is less likely but not excluded. Endoscopy may provide better evaluation. 4. Herniation of a segment of the sigmoid colon into the left inguinal canal. No bowel obstruction.     12/04/2023 Imaging   PET scan showed  Extensive hypermetabolic neoplastic disease including left lower lobe infiltrative mass extending from the pleura to the hilum with numerous abnormal left-sided hilar and mediastinal nodes.   Additional upper retroperitoneal nodes as well as extensive liver metastases.   Multifocal osseous metastatic disease including the thoracic and lumbar spine, pelvis, right humeral head and left scapula.   Based on the overall appearance and with the patient's laboratory a abnormality this very well could be a primary left lower lobe lung lesion such as small cell versus a neuroendocrine tumor with diffuse metastatic disease.   12/10/2023 Imaging   MRI brain w wo contrast  1. 10 x 9 x 10 mm rounded lesion in the posterior left frontal lobe with surrounding vasogenic edema is consistent with metastatic disease. 2. No other pathologic enhancement is present. 3. Periventricular and scattered subcortical T2 hyperintensities bilaterally are mildly advanced for age. The finding is nonspecific but can be seen in the setting of chronic microvascular ischemia, a demyelinating  process such as multiple sclerosis, vasculitis, complicated migraine headaches, or as the sequelae of a prior infectious or inflammatory process.   12/17/2023 Initial Diagnosis   Squamous cell carcinoma lung, left  (HCC)  He began experiencing abdominal pain for a week.  Initially presenting as increased gas and stomach growling despite having eaten. The pain was localized between the rib cage and the abdomen and described as intermittent.  Later the pain had resolved, but he continues to feel bloated. He has CT abdomen pelvis done which showed liver masses.   Liver biopsy showed  1. Liver, biopsy, right hepatic lesion :      - METASTATIC POORLY DIFFERENTIATED SQUAMOUS CELL CARCINOMA WITH NECROSIS.  Diagnosis Note : Per [facility] a recent PET scan revealed a hypermetabolic left lower lobe lung mass with extensive metastatic lesions. The carcinoma is positive for cytokeratin 7, p40 (greater than 50%), and CD56 (subset). Cytokeratin 20, TTF-1,  chromogranin, synaptophysin, and CDX-2 are negative. In the absence of  chromogranin / synaptophysin staining the presence of patchy CD56 staining is interpreted as non-specific. The pattern of immunoreactivity is consistent with metastatic poorly differentiated squamous cell carcinoma.     12/17/2023 Cancer Staging   Staging form: Lung, AJCC V9 - Clinical stage from 12/17/2023: Tony Mitchell, pM1c - Signed by Babara Call, MD on 12/17/2023 Histopathologic type: Squamous cell carcinoma, NOS Stage prefix: Initial diagnosis Laterality: Left Sites of metastasis: Bone, Liver, Brain   12/23/2023 - 12/25/2023 Chemotherapy   Patient is on Treatment Plan : LUNG Carboplatin  + Paclitaxel  q21d     01/14/2024 -  Chemotherapy   Carboplatin  + Paclitaxel  + Pembrolizumab  (200) D1 q21d x 4 cycles / Pembrolizumab  (200) Maintenance D1 q21d       Imaging   CT chest abdomen pelvis w contrast  1. Nearly complete resolution of previously seen dependent left lower lobe masses and nodularity. Additional small nonspecific pulmonary nodules unchanged. 2. No persistently enlarged mediastinal or left hilar lymph nodes. 3. Significantly diminished size of numerous hypodense liver lesions. 4. Constellation  of findings is consistent with treatment response of lung malignancy and associated metastatic disease. 5. Very little CT correlate of previously seen FDG avid osseous metastatic disease; subtle lucency of the T5 vertebral body at the site of a previously FDG avid metastasis. Other lesions not clearly appreciated by CT. Nuclear scintigraphic bone scan or repeat PET-CT may be helpful to assess for residual metabolically active osseous metastatic disease. 6. Emphysema and diffuse bilateral bronchial wall thickening. 7. Coronary artery disease. 8. Large left inguinal hernia containing nonobstructed sigmoid colon.   Aortic Atherosclerosis (ICD10-I70.0) and Emphysema (ICD10-J43.9).   02/18/2024 Imaging   CT chest abdomen pelvis w contrast showed 1. Nearly complete resolution of previously seen dependent left lower lobe masses and nodularity. Additional small nonspecific pulmonary nodules unchanged. 2. No persistently enlarged mediastinal or left hilar lymph nodes. 3. Significantly diminished size of numerous hypodense liver lesions. 4. Constellation of findings is consistent with treatment response of lung malignancy and associated metastatic disease. 5. Very little CT correlate of previously seen FDG avid osseous metastatic disease; subtle lucency of the T5 vertebral body at the site of a previously FDG avid metastasis. Other lesions not clearly appreciated by CT. Nuclear scintigraphic bone scan or repeat PET-CT may be helpful to assess for residual metabolically active osseous metastatic disease. 6. Emphysema and diffuse bilateral bronchial wall thickening. 7. Coronary artery disease. 8. Large left inguinal hernia containing nonobstructed sigmoid colon.   Aortic Atherosclerosis (ICD10-I70.0) and Emphysema (ICD10-J43.9).  Patient denies changes in bowel habits.  Denies unintentional weight loss night sweats or fever. No history of blood transfusions or hepatitis. He is not on any  blood thinners but takes aspirin  for preventive purposes.  He has a history of stage 3 renal disease diagnosed three years ago after a blood test revealed elevated potassium levels. Since then, he has made significant lifestyle changes, including stopping alcohol consumption and losing weight. He now maintains a weight between 145 and 151 pounds and walks approximately 5.6 miles daily.  He has a history of skin cancer, treated with resection, and is scheduled for further treatment. His father had prostate cancer.  Patient had a normal PSA in January 2025.   Patient lives at home with wife who has multiple medical problems.  He is the caregiver for her. His daughter passed away last year.  He has granddaughter who lives in [place] .  S/p brain radiation he finished tapering course of steroid.   + Peripheral neuropathy he takes gabapentin  100 mg 2-3 times a day. Follows up with neurology   INTERVAL HISTORY Tony Mitchell is a 84 y.o. male who has above history reviewed by me today presents for follow up visit for treatment of metastatic squamous cell lung cancer.   Discussed the use of AI scribe software for clinical note transcription with the patient, who gave verbal consent to proceed.   He reports 1 episode of right upper quadrant pain during his walking exercise.  He feels slightly lightheaded as well.  He has noticed shortness of breath when he walks fast.  He monitors his oxygen saturation.  He gets 92% during exercise.  Usually his oxygen at rest is 98 to 100%.  Pain has subsided.SABRA  He expresses feeling stressed while waiting for CT scan results.  Patient is a caregiver for his wife who has chronic respiratory failure.    MEDICAL HISTORY:  Past Medical History:  Diagnosis Date   Acute appendicitis 03/03/2018   Arthritis    Cancer (HCC)    skin cancer   Chronic kidney disease, stage 3b (HCC)    Collapsed lung 2019   x2   COPD (chronic obstructive pulmonary disease) (HCC)     Coronary artery disease    Diabetes mellitus without complication (HCC)    diet controlled-lost 60 lbs and is not on any current meds   GERD (gastroesophageal reflux disease)    Gout    Hyperlipidemia    Hypertension    Sleep apnea    uses cpap   Thoracic aortic aneurysm    Vitamin B 12 deficiency     SURGICAL HISTORY: Past Surgical History:  Procedure Laterality Date   CARDIAC CATHETERIZATION     CATARACT EXTRACTION  2012   CHEST TUBE INSERTION     x2   COLONOSCOPY WITH PROPOFOL  N/A 04/14/2021   Procedure: COLONOSCOPY WITH PROPOFOL ;  Surgeon: Maryruth Ole DASEN, MD;  Location: [facility];  Service: Endoscopy;  Laterality: N/A;   ESOPHAGOGASTRODUODENOSCOPY (EGD) WITH PROPOFOL  N/A 04/14/2021   Procedure: ESOPHAGOGASTRODUODENOSCOPY (EGD) WITH PROPOFOL ;  Surgeon: Maryruth Ole DASEN, MD;  Location: [facility] END[facility]COPY;  Service: Endoscopy;  Laterality: N/A;   INSERTION OF MESH  09/22/2021   Procedure: INSERTION OF MESH;  Surgeon: Tye Millet, DO;  Location: [facility] ORS;  Service: General;;   JOINT REPLACEMENT     KNEE SURGERY Left 1965   LAPAROSCOPIC APPENDECTOMY N/A 03/03/2018   Procedure: APPENDECTOMY LAPAROSCOPIC;  Surgeon: Nicholaus Selinda Birmingham, MD;  Location: [facility] ORS;  Service: General;  Laterality: N/A;   TOTAL KNEE ARTHROPLASTY Left 09/16/2018   Procedure: TOTAL KNEE ARTHROPLASTY;  Surgeon: Edie Norleen PARAS, MD;  Location: [facility] ORS;  Service: Orthopedics;  Laterality: Left;    SOCIAL HISTORY: Social History   Socioeconomic History   Marital status: Married    Spouse name: Not on file   Number of children: Not on file   Years of education: Not on file   Highest education level: Not on file  Occupational History   Not on file  Tobacco Use   Smoking status: Former    Current packs/day: 0.00    Average packs/day: 1 pack/day for 40.0 years (40.0 ttl pk-yrs)    Types: Cigarettes    Start date: 06/15/1956    Quit date: 06/15/1996    Years since quitting: 28.2    Passive  exposure: Past   Smokeless tobacco: Never  Vaping Use   Vaping status: Never Used  Substance and Sexual Activity   Alcohol use: Not Currently    Alcohol/week: 21.0 standard drinks of alcohol    Types: 21 Shots of liquor per week    Comment: reports last drink 11/03/20   Drug use: No   Sexual activity: Yes  Other Topics Concern   Not on file  Social History Narrative   Not on file   Social Drivers of Health   Tobacco Use: Medium Risk (09/15/2024)   Patient History    Smoking Tobacco Use: Former    Smokeless Tobacco Use: Never    Passive Exposure: Past  Physicist, Medical Strain: Low Risk  (08/03/2024)   Received from [facility]   Overall Financial Resource Strain (CARDIA)    Difficulty of Paying Living Expenses: Not hard at all  Food Insecurity: No Food Insecurity (08/03/2024)   Received from [facility]   Epic    Within the past 12 months, you worried that your food would run out before you got the money to buy more.: Never true    Within the past 12 months, the food you bought just didn't last and you didn't have money to get more.: Never true  Transportation Needs: No Transportation Needs (08/03/2024)   Received from [facility] - Transportation    In the past 12 months, has lack of transportation kept you from medical appointments or from getting medications?: No    Lack of Transportation (Non-Medical): No  Physical Activity: Not on file  Stress: Not on file  Social Connections: Not on file  Intimate Partner Violence: Not on file  Depression (PHQ2-9): Low Risk (06/16/2024)   Depression (PHQ2-9)    PHQ-2 Score: 0  Alcohol Screen: Not on file  Housing: Low Risk  (08/10/2024)   Received from [facility]   Epic    In the last 12 months, was there a time when you were not able to pay the mortgage or rent on time?: No    In the past 12 months, how many times have you moved where you were  living?: 0    At any time in the past 12 months, were you homeless or living in a shelter (including now)?: No  Utilities: Not At Risk (08/03/2024)   Received from [facility]   Epic    In the past 12 months has the electric, gas, oil, or water  company threatened to shut off services in your home?: No  Health Literacy: Not on file    FAMILY HISTORY:  Family History  Problem Relation Age of Onset   Cancer Father        Unknown   Prostate cancer Father    Lung cancer Neg Hx     ALLERGIES:  has no known allergies.  MEDICATIONS:  Current Outpatient Medications  Medication Sig Dispense Refill   allopurinol  (ZYLOPRIM ) 100 MG tablet Take 100 mg by mouth in the morning and at bedtime.     ALPRAZolam  (XANAX ) 0.5 MG tablet Take 0.5 mg by mouth at bedtime.     amLODipine  (NORVASC ) 2.5 MG tablet Take 2.5 mg by mouth in the morning.     colchicine 0.6 MG tablet Take 2 tablets (1.2mg ) by mouth at first sign of gout flare followed by 1 tablet (0.6mg ) after 1 hour. (Max 1.8mg  within 1 hour)     fenofibrate  (TRICOR ) 145 MG tablet Take 145 mg by mouth 2 (two) times a week. On Wednesday and Sunday     finasteride  (PROSCAR ) 5 MG tablet Take 1 tablet (5 mg total) by mouth daily. 90 tablet 3   furosemide  (LASIX ) 20 MG tablet Take 1 tablet (20 mg total) by mouth daily as needed. 30 tablet 0   gabapentin  (NEURONTIN ) 300 MG capsule TAKE 1 CAPSULE(300 MG) BY MOUTH THREE TIMES DAILY 90 capsule 2   hydrALAZINE  (APRESOLINE ) 25 MG tablet Take 25 mg by mouth 2 (two) times daily.     lansoprazole (PREVACID) 15 MG capsule Take 15 mg by mouth every evening.     simvastatin  (ZOCOR ) 20 MG tablet Take 1 tablet by mouth 2 (two) times a week. On  Wednesday and Sunday     tamsulosin  (FLOMAX ) 0.4 MG CAPS capsule Take 1 capsule (0.4 mg total) by mouth daily. 30 capsule 11   traMADol  (ULTRAM ) 50 MG tablet Take 1 tablet (50 mg total) by mouth every 12 (twelve) hours as needed. 30 tablet 0   vitamin B-12  (CYANOCOBALAMIN ) 1000 MCG tablet Take 1,000 mcg by mouth every morning.     Vitamin D, Ergocalciferol, (DRISDOL) 1.25 MG (50000 UNIT) CAPS capsule Take 50,000 Units by mouth.     No current facility-administered medications for this visit.   Facility-Administered Medications Ordered in Other Visits  Medication Dose Route Frequency Provider Last Rate Last Admin   0.9 %  sodium chloride  infusion   Intravenous Continuous Babara Call, MD 10 mL/hr at 02/04/24 0921 New Bag at 02/04/24 0921   0.9 %  sodium chloride  infusion   Intravenous Continuous Babara Call, MD   Stopped at 09/15/24 1612   heparin  lock flush 100 unit/mL  500 Units Intracatheter Once PRN Babara Call, MD        Review of Systems  Constitutional:  Negative for appetite change, chills, fatigue, fever and unexpected weight change.  HENT:   Negative for hearing loss and voice change.   Eyes:  Negative for eye problems and icterus.  Respiratory:  Negative for chest tightness, cough and shortness of breath.   Cardiovascular:  Negative for chest pain and leg swelling.  Gastrointestinal:  Negative for abdominal distention and abdominal pain.  Endocrine: Negative for hot flashes.  Genitourinary:  Negative for difficulty urinating, dysuria and frequency.   Musculoskeletal:  Positive for arthralgias.  Skin:  Negative for itching and rash.  Neurological:  Positive for numbness. Negative for light-headedness.  Hematological:  Negative for adenopathy. Does not bruise/bleed easily.  Psychiatric/Behavioral:  Negative for confusion.      PHYSICAL EXAMINATION: ECOG PERFORMANCE STATUS: 1 - Symptomatic but completely ambulatory  Vitals:   09/15/24 1421 09/15/24 1427  BP: (!) 145/78 129/69  Pulse: 74   Resp: 18   Temp: (!) 97.5 F (36.4 C)   SpO2: 98%    Filed Weights   09/15/24 1421  Weight: 159 lb (72.1 kg)    Physical Exam Constitutional:      General: He is not in acute distress.    Appearance: He is not diaphoretic.  HENT:      Head: Normocephalic and atraumatic.  Eyes:     General: No scleral icterus. Cardiovascular:     Rate and Rhythm: Normal rate and regular rhythm.  Pulmonary:     Effort: Pulmonary effort is normal. No respiratory distress.     Breath sounds: Normal breath sounds.  Abdominal:     General: Bowel sounds are normal. There is no distension.     Palpations: Abdomen is soft.  Musculoskeletal:        General: Normal range of motion.     Cervical back: Normal range of motion and neck supple.  Skin:    General: Skin is warm and dry.     Findings: No erythema.  Neurological:     Mental Status: He is alert and oriented to person, place, and time. Mental status is at baseline.     Cranial Nerves: No cranial nerve deficit.     Motor: No abnormal muscle tone.  Psychiatric:        Mood and Affect: Affect normal.      LABORATORY DATA:  I have reviewed the data as listed    Latest Ref Rng & Units 09/15/2024    1:45 PM 08/25/2024    8:40 AM 07/28/2024    8:51 AM  CBC  WBC 4.0 - 10.5 K/uL 8.5  6.4  7.4   Hemoglobin 13.0 - 17.0 g/dL 84.5  84.9  84.2   Hematocrit 39.0 - 52.0 % 46.0  45.0  47.0   Platelets 150 - 400 K/uL 281  249  183       Latest Ref Rng & Units 09/15/2024    1:45 PM 08/25/2024    8:40 AM 07/28/2024    8:51 AM  CMP  Glucose 70 - 99 mg/dL 85  889  897   BUN 8 - 23 mg/dL 16  21  17    Creatinine 0.61 - 1.24 mg/dL 9.02  8.81  9.03   Sodium 135 - 145 mmol/L 142  141  140   Potassium 3.5 - 5.1 mmol/L 4.8  4.9  4.3   Chloride 98 - 111 mmol/L 103  103  100   CO2 22 - 32 mmol/L 26  29  28    Calcium 8.9 - 10.3 mg/dL 89.8  9.8  9.7   Total Protein 6.5 - 8.1 g/dL 7.3  7.0  6.8   Total Bilirubin 0.0 - 1.2 mg/dL 0.6  0.4  0.6   Alkaline Phos 38 - 126 U/L 91  105  92   AST 15 - 41 U/L 23  25  19    ALT 0 - 44 U/L 16  20  19       RADIOGRAPHIC STUDIES: I have personally reviewed the radiological images as listed and agreed with the findings in the report. CT CHEST ABDOMEN PELVIS W  CONTRAST Result Date: 08/24/2024 EXAM: CT CHEST, ABDOMEN AND PELVIS WITH CONTRAST 08/17/2024 11:58:13 AM TECHNIQUE: CT of the chest, abdomen and pelvis was performed with the administration of 100 mL of iohexol  (OMNIPAQUE ) 300 MG/ML solution intravenous  contrast. Multiplanar reformatted images are provided for review. Automated exposure control, iterative reconstruction, and/or weight based adjustment of the mA/kV was utilized to reduce the radiation dose to as low as reasonably achievable. COMPARISON: CT chest abdomen and pelvis 05/13/2024. CLINICAL HISTORY: Squamous cell carcinoma of lung, Squamous cell carcinoma lung, left. * Tracking Code: BO * FINDINGS: CHEST: MEDIASTINUM AND LYMPH NODES: Heart and pericardium are unremarkable. The central airways are clear. No mediastinal, hilar or axillary lymphadenopathy. No supraclavicular adenopathy. LUNGS AND PLEURA: No Residual mass lesion in the left lower lobe. No measurable lesion. Small right lower lobe pulmonary nodule measuring 3 mm on image 81, unchanged from prior. No new pulmonary nodules. No pleural effusion. No pneumothorax. ABDOMEN AND PELVIS: LIVER: Multiple hypodense lesions in the left and right hepatic lobes, not changed in size or number compared to prior. Example lesion in the right hepatic lobe measuring 11 x 7 mm on image 60 compared to 12 x 11 mm on prior. Lesion of the posterior right hepatic lobe measuring 13 mm on image 65 compared to 13 mm. Lesions are predominantly located in the right hepatic lobe. Portal veins are patent. No periportal lymphadenopathy. GALLBLADDER AND BILE DUCTS: Unremarkable. No biliary ductal dilatation. SPLEEN: No acute abnormality. PANCREAS: No acute abnormality. ADRENAL GLANDS: The adrenal glands are normal. KIDNEYS, URETERS AND BLADDER: No stones in the kidneys or ureters. No hydronephrosis. No perinephric or periureteral stranding. Urinary bladder is unremarkable. GI AND BOWEL: Stomach demonstrates no acute abnormality.  There is no bowel obstruction. Finding of a large left inguinal hernia which contains a long segment of nonobstructed descending colon. REPRODUCTIVE ORGANS: No acute abnormality. PERITONEUM AND RETROPERITONEUM: No ascites. No free air. There are small lymph nodes in the retroperitoneum along the aorta which are not changed from prior. For example, a small node positioned between the IVC and the aorta on image 74 measures 7 mm compared to 7 mm. Similar small lymph nodes left of the aorta on image 73 unchanged. VASCULATURE: Aorta is normal in caliber. ABDOMINAL AND PELVIS LYMPH NODES: No other significant abdominal or pelvic lymphadenopathy. BONES AND SOFT TISSUES: No acute osseous abnormality. No focal soft tissue abnormality. IMPRESSION: 1. No measurable residual mass in the left lower lobe, with no new pulmonary nodules or thoracic adenopathy. 2. Stable multiple hepatic hypodense lesions. No progressive liver disease. 3.  Stabel small retroperitoneal lymph nodes, 4. no new evidence of metastatic disease in the chest, abdomen, or pelvis. 5. Large left inguinal hernia containing a long segment of nonobstructed descending colon. Electronically signed by: Norleen Boxer MD 08/24/2024 05:07 PM EST RP Workstation: [id number]      "

## 2024-09-15 NOTE — Patient Instructions (Signed)
 [facility] - A DEPT OF [facility]  Discharge Instructions: Thank you for choosing [facility] to provide your oncology and hematology care.  If you have a lab appointment with the Cancer Center, please go directly to the Cancer Center and check in at the registration area.  Wear comfortable clothing and clothing appropriate for easy access to any Portacath or PICC line.   We strive to give you quality time with your provider. You may need to reschedule your appointment if you arrive late (15 or more minutes).  Arriving late affects you and other patients whose appointments are after yours.  Also, if you miss three or more appointments without notifying the office, you may be dismissed from the clinic at the provider's discretion.      For prescription refill requests, have your pharmacy contact our office and allow 72 hours for refills to be completed.    Today you received the following chemotherapy and/or immunotherapy agents Rande Lawman      To help prevent nausea and vomiting after your treatment, we encourage you to take your nausea medication as directed.  BELOW ARE SYMPTOMS THAT SHOULD BE REPORTED IMMEDIATELY: *FEVER GREATER THAN 100.4 F (38 C) OR HIGHER *CHILLS OR SWEATING *NAUSEA AND VOMITING THAT IS NOT CONTROLLED WITH YOUR NAUSEA MEDICATION *UNUSUAL SHORTNESS OF BREATH *UNUSUAL BRUISING OR BLEEDING *URINARY PROBLEMS (pain or burning when urinating, or frequent urination) *BOWEL PROBLEMS (unusual diarrhea, constipation, pain near the anus) TENDERNESS IN MOUTH AND THROAT WITH OR WITHOUT PRESENCE OF ULCERS (sore throat, sores in mouth, or a toothache) UNUSUAL RASH, SWELLING OR PAIN  UNUSUAL VAGINAL DISCHARGE OR ITCHING   Items with * indicate a potential emergency and should be followed up as soon as possible or go to the Emergency Department if any problems should occur.  Please show the CHEMOTHERAPY ALERT CARD or IMMUNOTHERAPY  ALERT CARD at check-in to the Emergency Department and triage nurse.  Should you have questions after your visit or need to cancel or reschedule your appointment, please contact [facility] - A DEPT OF [facility]  [phone] and follow the prompts.  Office hours are 8:00 a.m. to 4:30 p.m. Monday - Friday. Please note that voicemails left after 4:00 p.m. may not be returned until the following business day.  We are closed weekends and major holidays. You have access to a nurse at all times for urgent questions. Please call the main number to the clinic [phone] and follow the prompts.  For any non-urgent questions, you may also contact your provider using MyChart. We now offer e-Visits for anyone 48 and older to request care online for non-urgent symptoms. For details visit [email or web address].   Also download the MyChart app! Go to the app store, search "MyChart", open the app, select [facility], and log in with your MyChart username and password.

## 2024-09-15 NOTE — Assessment & Plan Note (Signed)
 Imaging results and pathology results were reviewed and discussed with patient. 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 NGS showed TMB 53.2 high, PD-L1 <1%, SMARCB1- high TMB, Keytruda  was added on cycle 2 Previously on carboplatin , Taxol  Keytruda . Currently on Keytruda  maintenance September 2025 CT scan showed continue partial response.   Labs are reviewed and discussed with patient.   continue Keytruda  for maintenance.   December 2025 CT scan results were reviewed with patient.  Continued partial response/stable disease.  Continue current regimen.

## 2024-09-15 NOTE — Assessment & Plan Note (Signed)
 Keytruda  treatments as planned

## 2024-09-15 NOTE — Assessment & Plan Note (Signed)
 S/p Brain Radiation. Repeat brain MRI showed treatment response . He was seen by neurology Dr. Buckley.  - plan to repeat MRI brain in February 2026

## 2024-09-16 LAB — T4: T4, Total: 7.8 ug/dL (ref 4.5–12.0)

## 2024-09-22 ENCOUNTER — Ambulatory Visit: Payer: Self-pay | Admitting: Urology

## 2024-09-25 ENCOUNTER — Ambulatory Visit: Admitting: Urology

## 2024-09-29 ENCOUNTER — Ambulatory Visit

## 2024-09-30 ENCOUNTER — Ambulatory Visit: Admitting: Urology

## 2024-10-02 ENCOUNTER — Inpatient Hospital Stay: Admitting: Internal Medicine

## 2024-10-06 ENCOUNTER — Inpatient Hospital Stay: Admitting: Oncology

## 2024-10-06 ENCOUNTER — Inpatient Hospital Stay

## 2024-10-09 ENCOUNTER — Ambulatory Visit: Admitting: Internal Medicine

## 2024-10-27 ENCOUNTER — Inpatient Hospital Stay

## 2024-10-27 ENCOUNTER — Inpatient Hospital Stay: Admitting: Hospice and Palliative Medicine

## 2024-10-27 ENCOUNTER — Inpatient Hospital Stay: Admitting: Oncology

## 2024-11-11 ENCOUNTER — Ambulatory Visit: Admitting: Radiation Oncology
